# Patient Record
Sex: Male | Born: 1980
Health system: Southern US, Community
[De-identification: ages and names within clinical notes are randomized; demographics above are authoritative.]

## PROBLEM LIST (undated history)

## (undated) DIAGNOSIS — I5033 Acute on chronic diastolic (congestive) heart failure: Secondary | ICD-10-CM

## (undated) DIAGNOSIS — E119 Type 2 diabetes mellitus without complications: Secondary | ICD-10-CM

## (undated) DIAGNOSIS — IMO0001 Reserved for inherently not codable concepts without codable children: Secondary | ICD-10-CM

## (undated) DIAGNOSIS — I10 Essential (primary) hypertension: Secondary | ICD-10-CM

## (undated) DIAGNOSIS — R0902 Hypoxemia: Secondary | ICD-10-CM

## (undated) DIAGNOSIS — E662 Morbid (severe) obesity with alveolar hypoventilation: Secondary | ICD-10-CM

## (undated) DIAGNOSIS — G4733 Obstructive sleep apnea (adult) (pediatric): Secondary | ICD-10-CM

## (undated) DIAGNOSIS — K219 Gastro-esophageal reflux disease without esophagitis: Secondary | ICD-10-CM

## (undated) DIAGNOSIS — L0390 Cellulitis, unspecified: Secondary | ICD-10-CM

## (undated) HISTORY — PX: GASTRIC BYPASS: SHX52

## (undated) HISTORY — DX: Gastro-esophageal reflux disease without esophagitis: K21.9

---

## 2005-10-20 ENCOUNTER — Ambulatory Visit (HOSPITAL_COMMUNITY): Admission: RE | Admit: 2005-10-20 | Discharge: 2005-10-20 | Payer: Self-pay | Admitting: Internal Medicine

## 2013-05-06 ENCOUNTER — Encounter (HOSPITAL_BASED_OUTPATIENT_CLINIC_OR_DEPARTMENT_OTHER): Payer: Self-pay | Admitting: *Deleted

## 2013-05-06 ENCOUNTER — Emergency Department (HOSPITAL_BASED_OUTPATIENT_CLINIC_OR_DEPARTMENT_OTHER)
Admission: EM | Admit: 2013-05-06 | Discharge: 2013-05-06 | Disposition: A | Discharge status: Home / Self Care | Payer: BC Managed Care – PPO | Attending: Emergency Medicine | Admitting: Emergency Medicine

## 2013-05-06 ENCOUNTER — Emergency Department (HOSPITAL_BASED_OUTPATIENT_CLINIC_OR_DEPARTMENT_OTHER): Payer: BC Managed Care – PPO

## 2013-05-06 DIAGNOSIS — Y9389 Activity, other specified: Secondary | ICD-10-CM | POA: Insufficient documentation

## 2013-05-06 DIAGNOSIS — Z79899 Other long term (current) drug therapy: Secondary | ICD-10-CM | POA: Insufficient documentation

## 2013-05-06 DIAGNOSIS — Y929 Unspecified place or not applicable: Secondary | ICD-10-CM | POA: Insufficient documentation

## 2013-05-06 DIAGNOSIS — E119 Type 2 diabetes mellitus without complications: Secondary | ICD-10-CM | POA: Insufficient documentation

## 2013-05-06 DIAGNOSIS — I10 Essential (primary) hypertension: Secondary | ICD-10-CM | POA: Insufficient documentation

## 2013-05-06 DIAGNOSIS — Z8719 Personal history of other diseases of the digestive system: Secondary | ICD-10-CM | POA: Insufficient documentation

## 2013-05-06 DIAGNOSIS — S9031XA Contusion of right foot, initial encounter: Secondary | ICD-10-CM

## 2013-05-06 DIAGNOSIS — W208XXA Other cause of strike by thrown, projected or falling object, initial encounter: Secondary | ICD-10-CM | POA: Insufficient documentation

## 2013-05-06 DIAGNOSIS — S9030XA Contusion of unspecified foot, initial encounter: Secondary | ICD-10-CM | POA: Insufficient documentation

## 2013-05-06 HISTORY — DX: Type 2 diabetes mellitus without complications: E11.9

## 2013-05-06 HISTORY — DX: Gastro-esophageal reflux disease without esophagitis: K21.9

## 2013-05-06 HISTORY — DX: Essential (primary) hypertension: I10

## 2013-05-06 HISTORY — DX: Reserved for inherently not codable concepts without codable children: IMO0001

## 2013-05-06 MED ORDER — TRAMADOL HCL 50 MG PO TABS: 50.0000 mg | ORAL_TABLET | Freq: Four times a day (QID) | ORAL | Status: DC | PRN

## 2013-05-06 NOTE — ED Notes (Signed)
Patient states he dropped a speaker on his right foot five days ago.  C/O pain in his right lateral foot and the sole of the foot.

## 2013-05-06 NOTE — ED Provider Notes (Signed)
CSN: [id number]     Arrival date & time 05/06/13  1478 History   First MD Initiated Contact with Patient 05/06/13 1040     Chief Complaint  Patient presents with  . Foot Injury   (Consider location/radiation/quality/duration/timing/severity/associated sxs/prior Treatment) HPI Comments: Patient states he dropped a speaker on his right foot 4 days ago and has been having pain since.  Patient is a 32 y.o. male presenting with foot injury. The history is provided by the patient.  Foot Injury Location:  Foot Time since incident:  4 days Injury: yes   Foot location:  R foot Pain details:    Quality:  Sharp   Radiates to:  Does not radiate   Severity:  Moderate   Onset quality:  Sudden   Timing:  Constant   Progression:  Unchanged Chronicity:  New   Past Medical History  Diagnosis Date  . Hypertension   . Reflux   . Diabetes mellitus without complication    History reviewed. No pertinent past surgical history. No family history on file. History  Substance Use Topics  . Smoking status: Not on file  . Smokeless tobacco: Not on file  . Alcohol Use: Not on file    Review of Systems  All other systems reviewed and are negative.    Allergies  Other  Home Medications   Current Outpatient Rx  Name  Route  Sig  Dispense  Refill  . lisinopril (PRINIVIL,ZESTRIL) 10 MG tablet   Oral   Take 10 mg by mouth daily.         Marland Kitchen loratadine (CLARITIN) 10 MG tablet   Oral   Take 10 mg by mouth daily.         . metFORMIN (GLUCOPHAGE) 1000 MG tablet   Oral   Take 1,000 mg by mouth 2 (two) times daily with a meal.          BP 169/116  Pulse 99  Temp(Src) 97.9 F (36.6 C) (Oral)  Resp 26  Ht 5\' 10"  (1.778 m)  Wt 325 lb (147.419 kg)  BMI 46.63 kg/m2  SpO2 99% Physical Exam  Nursing note and vitals reviewed. Constitutional: He is oriented to person, place, and time. He appears well-developed and well-nourished. No distress.  HENT:  Head: Normocephalic and atraumatic.   Neck: Normal range of motion. Neck supple.  Musculoskeletal:  There is mild swelling over the midfoot with no obvious deformity or ecchymosis. capillary refill is intact distally.  Neurological: He is alert and oriented to person, place, and time.  Skin: Skin is warm and dry. He is not diaphoretic.    ED Course  Procedures (including critical care time) Labs Review Labs Reviewed - No data to display Imaging Review Dg Foot Complete Right  05/06/2013   CLINICAL DATA:  Initial evaluation for crush injury to the right foot which occurred on 05/01/2013 when a speaker fell upon the right foot. Dorsal and lateral foot pain.  EXAM: RIGHT FOOT COMPLETE - 3+ VIEW  COMPARISON:  None.  FINDINGS: No evidence of acute or subacute fracture or dislocation. Joint spaces well preserved. Well-preserved bone mineral density. No intrinsic osseous abnormalities.  IMPRESSION: Normal examination.   Electronically Signed   By: Hulan Saas M.D.   On: 05/06/2013 10:38    MDM  No diagnosis found. X-rays negative. This appears to be a contusion. Will advise rest ibuprofen and will prescribe tramadol if ibuprofen is not effective for his pain.    Geoffery Lyons, MD 05/06/13 1051

## 2014-06-19 ENCOUNTER — Encounter (HOSPITAL_BASED_OUTPATIENT_CLINIC_OR_DEPARTMENT_OTHER): Payer: Self-pay | Admitting: Emergency Medicine

## 2014-06-19 ENCOUNTER — Emergency Department (HOSPITAL_BASED_OUTPATIENT_CLINIC_OR_DEPARTMENT_OTHER)
Admission: EM | Admit: 2014-06-19 | Discharge: 2014-06-19 | Disposition: A | Discharge status: Home / Self Care | Payer: BC Managed Care – PPO | Attending: Emergency Medicine | Admitting: Emergency Medicine

## 2014-06-19 ENCOUNTER — Emergency Department (HOSPITAL_BASED_OUTPATIENT_CLINIC_OR_DEPARTMENT_OTHER): Payer: BC Managed Care – PPO

## 2014-06-19 DIAGNOSIS — Z8719 Personal history of other diseases of the digestive system: Secondary | ICD-10-CM | POA: Insufficient documentation

## 2014-06-19 DIAGNOSIS — Z7982 Long term (current) use of aspirin: Secondary | ICD-10-CM | POA: Insufficient documentation

## 2014-06-19 DIAGNOSIS — I10 Essential (primary) hypertension: Secondary | ICD-10-CM

## 2014-06-19 DIAGNOSIS — J069 Acute upper respiratory infection, unspecified: Secondary | ICD-10-CM

## 2014-06-19 DIAGNOSIS — E119 Type 2 diabetes mellitus without complications: Secondary | ICD-10-CM | POA: Insufficient documentation

## 2014-06-19 DIAGNOSIS — Z79899 Other long term (current) drug therapy: Secondary | ICD-10-CM | POA: Insufficient documentation

## 2014-06-19 DIAGNOSIS — J9801 Acute bronchospasm: Secondary | ICD-10-CM | POA: Insufficient documentation

## 2014-06-19 DIAGNOSIS — Z7952 Long term (current) use of systemic steroids: Secondary | ICD-10-CM | POA: Insufficient documentation

## 2014-06-19 DIAGNOSIS — R059 Cough, unspecified: Secondary | ICD-10-CM

## 2014-06-19 DIAGNOSIS — R05 Cough: Secondary | ICD-10-CM

## 2014-06-19 LAB — CBC WITH DIFFERENTIAL/PLATELET
BASOS ABS: 0 10*3/uL (ref 0.0–0.1)
BASOS PCT: 0 % (ref 0–1)
EOS PCT: 1 % (ref 0–5)
Eosinophils Absolute: 0.1 10*3/uL (ref 0.0–0.7)
HCT: 40.8 % (ref 39.0–52.0)
HEMOGLOBIN: 12.9 g/dL — AB (ref 13.0–17.0)
LYMPHS ABS: 1.7 10*3/uL (ref 0.7–4.0)
Lymphocytes Relative: 19 % (ref 12–46)
MCH: 27.7 pg (ref 26.0–34.0)
MCHC: 31.6 g/dL (ref 30.0–36.0)
MCV: 87.6 fL (ref 78.0–100.0)
MONO ABS: 0.6 10*3/uL (ref 0.1–1.0)
MONOS PCT: 7 % (ref 3–12)
NEUTROS ABS: 6.4 10*3/uL (ref 1.7–7.7)
Neutrophils Relative %: 73 % (ref 43–77)
Platelets: 224 10*3/uL (ref 150–400)
RBC: 4.66 MIL/uL (ref 4.22–5.81)
RDW: 15 % (ref 11.5–15.5)
WBC: 8.9 10*3/uL (ref 4.0–10.5)

## 2014-06-19 LAB — BASIC METABOLIC PANEL
ANION GAP: 11 (ref 5–15)
BUN: 10 mg/dL (ref 6–23)
CALCIUM: 9.7 mg/dL (ref 8.4–10.5)
CO2: 31 mEq/L (ref 19–32)
Chloride: 97 mEq/L (ref 96–112)
Creatinine, Ser: 0.8 mg/dL (ref 0.50–1.35)
Glucose, Bld: 161 mg/dL — ABNORMAL HIGH (ref 70–99)
Potassium: 4.1 mEq/L (ref 3.7–5.3)
SODIUM: 139 meq/L (ref 137–147)

## 2014-06-19 LAB — PRO B NATRIURETIC PEPTIDE: Pro B Natriuretic peptide (BNP): 535 pg/mL — ABNORMAL HIGH (ref 0–125)

## 2014-06-19 LAB — CBG MONITORING, ED: Glucose-Capillary: 149 mg/dL — ABNORMAL HIGH (ref 70–99)

## 2014-06-19 LAB — TROPONIN I

## 2014-06-19 MED ORDER — ALBUTEROL SULFATE HFA 108 (90 BASE) MCG/ACT IN AERS: 2.0000 | INHALATION_SPRAY | RESPIRATORY_TRACT | Status: DC | PRN

## 2014-06-19 MED ORDER — IPRATROPIUM-ALBUTEROL 0.5-2.5 (3) MG/3ML IN SOLN
3.0000 mL | RESPIRATORY_TRACT | Status: DC
  Administered 2014-06-19: 3 mL via RESPIRATORY_TRACT
  Filled 2014-06-19: qty 3

## 2014-06-19 MED ORDER — ALBUTEROL SULFATE HFA 108 (90 BASE) MCG/ACT IN AERS
INHALATION_SPRAY | RESPIRATORY_TRACT | Status: AC
  Administered 2014-06-19: 18:00:00
  Filled 2014-06-19: qty 6.7

## 2014-06-19 MED ORDER — PREDNISONE 50 MG PO TABS
60.0000 mg | ORAL_TABLET | Freq: Once | ORAL | Status: AC
  Administered 2014-06-19: 60 mg via ORAL
  Filled 2014-06-19 (×2): qty 1

## 2014-06-19 MED ORDER — PREDNISONE 20 MG PO TABS: ORAL_TABLET | ORAL | Status: DC

## 2014-06-19 NOTE — ED Notes (Signed)
Productive cough   And congestion   With sob x5 days

## 2014-06-19 NOTE — Discharge Instructions (Signed)
You appear to have an upper respiratory infection (URI). An upper respiratory tract infection, or cold, is a viral infection of the air passages leading to the lungs. It is contagious and can be spread to others, especially during the first 3 or 4 days. It cannot be cured by antibiotics or other medicines. °RETURN IMMEDIATELY IF you develop shortness of breath, confusion or altered mental status, a new rash, become dizzy, faint, or poorly responsive, or are unable to be cared for at home. ° °

## 2014-06-19 NOTE — ED Notes (Signed)
Patient transported to X-ray 

## 2014-06-19 NOTE — ED Provider Notes (Signed)
CSN: [id number]     Arrival date & time 06/19/14  1544 History   First MD Initiated Contact with Patient 06/19/14 1619     Chief Complaint  Patient presents with  . Shortness of Breath   cough   (Consider location/radiation/quality/duration/timing/severity/associated sxs/prior Treatment) HPI 33 year old male with history of diabetes and hypertension presents with 2 days of nasal congestion with a nonproductive cough with intermittent wheezing and intermittent mild shortness of breath with no history of asthma no treatment prior to arrival with no fever no confusion no rash no vomiting no diarrhea no chest pain no abdominal pain and no change in baseline edema not currently short of breath and has not used an inhaler previously. He was seen by his primary care doctor today who sent him to the emergency department for seemingly asymptomatic hypertension. Patient has no headache no confusion no change in speech or vision no weakness or numbness no chest pain no current shortness of breath. Past Medical History  Diagnosis Date  . Hypertension   . Reflux   . Diabetes mellitus without complication    History reviewed. No pertinent past surgical history. No family history on file. History  Substance Use Topics  . Smoking status: Never Smoker   . Smokeless tobacco: Not on file  . Alcohol Use: No    Review of Systems 10 Systems reviewed and are negative for acute change except as noted in the HPI.   Allergies  Other; Vancomycin; and Zosyn  Home Medications   Prior to Admission medications   Medication Sig Start Date End Date Taking? Authorizing Provider  aspirin 81 MG tablet Take 81 mg by mouth daily.   Yes Historical Provider, MD  furosemide (LASIX) 40 MG tablet Take 40 mg by mouth daily.   Yes Historical Provider, MD  glimepiride (AMARYL) 4 MG tablet Take 4 mg by mouth daily with breakfast.   Yes Historical Provider, MD  lisinopril-hydrochlorothiazide (PRINZIDE,ZESTORETIC) 20-25 MG  per tablet Take 1 tablet by mouth daily.   Yes Historical Provider, MD  albuterol (PROVENTIL HFA;VENTOLIN HFA) 108 (90 BASE) MCG/ACT inhaler Inhale 2 puffs into the lungs every 2 (two) hours as needed for wheezing or shortness of breath (cough). 06/19/14   Hurman HornJohn M Handsome Anglin, MD  lisinopril (PRINIVIL,ZESTRIL) 10 MG tablet Take 10 mg by mouth daily.    Historical Provider, MD  loratadine (CLARITIN) 10 MG tablet Take 10 mg by mouth daily.    Historical Provider, MD  metFORMIN (GLUCOPHAGE) 1000 MG tablet Take 1,000 mg by mouth 2 (two) times daily with a meal.    Historical Provider, MD  predniSONE (DELTASONE) 20 MG tablet 2 tabs po daily x 4 days 06/19/14   Hurman HornJohn M Broxton Broady, MD  traMADol (ULTRAM) 50 MG tablet Take 1 tablet (50 mg total) by mouth every 6 (six) hours as needed for pain. 05/06/13   Geoffery Lyonsouglas Delo, MD   BP 174/114 mmHg  Pulse 98  Temp(Src) 97.8 F (36.6 C) (Oral)  Resp 20  SpO2 97% Physical Exam  Constitutional:  Awake, alert, nontoxic appearance.  HENT:  Head: Atraumatic.  Eyes: Right eye exhibits no discharge. Left eye exhibits no discharge.  Neck: Neck supple.  Cardiovascular: Normal rate and regular rhythm.   No murmur heard. Pulmonary/Chest: Effort normal. No respiratory distress. He has wheezes. He has no rales. He exhibits no tenderness.  Diffuse wheezes, speech normal, no retractions, no accessory muscle usage, pulse oximetry low end of normal range at 90% room air  Abdominal: Soft. There is  no tenderness. There is no rebound.  Musculoskeletal: He exhibits edema. He exhibits no tenderness.  Baseline ROM, no obvious new focal weakness. Baseline mild edema both lower legs.  Neurological: He is alert.  Mental status and motor strength appears baseline for patient and situation.  Skin: No rash noted.  Psychiatric: He has a normal mood and affect.  Nursing note and vitals reviewed.   ED Course  Procedures (including critical care time) Labs Review Labs Reviewed  CBC WITH  DIFFERENTIAL - Abnormal; Notable for the following:    Hemoglobin 12.9 (*)    All other components within normal limits  PRO B NATRIURETIC PEPTIDE - Abnormal; Notable for the following:    Pro B Natriuretic peptide (BNP) 535.0 (*)    All other components within normal limits  BASIC METABOLIC PANEL - Abnormal; Notable for the following:    Glucose, Bld 161 (*)    All other components within normal limits  CBG MONITORING, ED - Abnormal; Notable for the following:    Glucose-Capillary 149 (*)    All other components within normal limits  TROPONIN I    Imaging Review No results found.   EKG Interpretation   Date/Time:  Friday June 19 2014 16:20:23 EST Ventricular Rate:  103 PR Interval:  202 QRS Duration: 68 QT Interval:  346 QTC Calculation: 453 R Axis:   -35 Text Interpretation:  Sinus tachycardia Left axis deviation Low voltage  QRS Cannot rule out Anterior infarct , age undetermined No previous ECGs  available Confirmed by [facility]  MD, Jonny RuizJOHN ([id number]) on 06/19/2014 4:36:14 PM      MDM   Final diagnoses:  Cough  URI (upper respiratory infection)  Bronchospasm, acute  Essential hypertension    Patient informed of clinical course, understand medical decision-making process, and agree with plan. I doubt any other EMC precluding discharge at this time including, but not necessarily limited to the following:HTN crisis.   Hurman HornJohn M Emalynn Clewis, MD 06/25/14 [phone]

## 2014-06-19 NOTE — ED Notes (Signed)
MD at bedside. 

## 2015-06-12 ENCOUNTER — Emergency Department (HOSPITAL_BASED_OUTPATIENT_CLINIC_OR_DEPARTMENT_OTHER): Payer: 59

## 2015-06-12 ENCOUNTER — Encounter (HOSPITAL_BASED_OUTPATIENT_CLINIC_OR_DEPARTMENT_OTHER): Payer: Self-pay | Admitting: *Deleted

## 2015-06-12 ENCOUNTER — Inpatient Hospital Stay (HOSPITAL_BASED_OUTPATIENT_CLINIC_OR_DEPARTMENT_OTHER): Payer: 59

## 2015-06-12 ENCOUNTER — Inpatient Hospital Stay (HOSPITAL_BASED_OUTPATIENT_CLINIC_OR_DEPARTMENT_OTHER)
Admission: EM | Admit: 2015-06-12 | Discharge: 2015-06-30 | DRG: 004 | Disposition: A | Discharge status: Other Acute Inpatient Hospital | Payer: 59 | Attending: Internal Medicine | Admitting: Internal Medicine

## 2015-06-12 DIAGNOSIS — Z7982 Long term (current) use of aspirin: Secondary | ICD-10-CM

## 2015-06-12 DIAGNOSIS — J9600 Acute respiratory failure, unspecified whether with hypoxia or hypercapnia: Secondary | ICD-10-CM | POA: Diagnosis present

## 2015-06-12 DIAGNOSIS — Y95 Nosocomial condition: Secondary | ICD-10-CM | POA: Diagnosis not present

## 2015-06-12 DIAGNOSIS — Z7984 Long term (current) use of oral hypoglycemic drugs: Secondary | ICD-10-CM

## 2015-06-12 DIAGNOSIS — K5900 Constipation, unspecified: Secondary | ICD-10-CM | POA: Diagnosis present

## 2015-06-12 DIAGNOSIS — D649 Anemia, unspecified: Secondary | ICD-10-CM | POA: Diagnosis not present

## 2015-06-12 DIAGNOSIS — E870 Hyperosmolality and hypernatremia: Secondary | ICD-10-CM | POA: Diagnosis not present

## 2015-06-12 DIAGNOSIS — I472 Ventricular tachycardia: Secondary | ICD-10-CM | POA: Diagnosis present

## 2015-06-12 DIAGNOSIS — E872 Acidosis: Secondary | ICD-10-CM | POA: Diagnosis present

## 2015-06-12 DIAGNOSIS — I110 Hypertensive heart disease with heart failure: Secondary | ICD-10-CM | POA: Diagnosis present

## 2015-06-12 DIAGNOSIS — E662 Morbid (severe) obesity with alveolar hypoventilation: Secondary | ICD-10-CM | POA: Diagnosis present

## 2015-06-12 DIAGNOSIS — J9602 Acute respiratory failure with hypercapnia: Principal | ICD-10-CM | POA: Diagnosis present

## 2015-06-12 DIAGNOSIS — Z23 Encounter for immunization: Secondary | ICD-10-CM | POA: Diagnosis not present

## 2015-06-12 DIAGNOSIS — J189 Pneumonia, unspecified organism: Secondary | ICD-10-CM | POA: Diagnosis not present

## 2015-06-12 DIAGNOSIS — I272 Other secondary pulmonary hypertension: Secondary | ICD-10-CM | POA: Diagnosis present

## 2015-06-12 DIAGNOSIS — E871 Hypo-osmolality and hyponatremia: Secondary | ICD-10-CM | POA: Diagnosis present

## 2015-06-12 DIAGNOSIS — R1084 Generalized abdominal pain: Secondary | ICD-10-CM | POA: Diagnosis present

## 2015-06-12 DIAGNOSIS — I5033 Acute on chronic diastolic (congestive) heart failure: Secondary | ICD-10-CM | POA: Diagnosis present

## 2015-06-12 DIAGNOSIS — Z6841 Body Mass Index (BMI) 40.0 and over, adult: Secondary | ICD-10-CM | POA: Diagnosis not present

## 2015-06-12 DIAGNOSIS — G9340 Encephalopathy, unspecified: Secondary | ICD-10-CM | POA: Diagnosis present

## 2015-06-12 DIAGNOSIS — G4733 Obstructive sleep apnea (adult) (pediatric): Secondary | ICD-10-CM | POA: Diagnosis present

## 2015-06-12 DIAGNOSIS — E876 Hypokalemia: Secondary | ICD-10-CM | POA: Diagnosis present

## 2015-06-12 DIAGNOSIS — E1165 Type 2 diabetes mellitus with hyperglycemia: Secondary | ICD-10-CM | POA: Diagnosis present

## 2015-06-12 DIAGNOSIS — J9819 Other pulmonary collapse: Secondary | ICD-10-CM | POA: Diagnosis present

## 2015-06-12 DIAGNOSIS — Z9911 Dependence on respirator [ventilator] status: Secondary | ICD-10-CM | POA: Diagnosis not present

## 2015-06-12 DIAGNOSIS — K219 Gastro-esophageal reflux disease without esophagitis: Secondary | ICD-10-CM | POA: Diagnosis present

## 2015-06-12 DIAGNOSIS — Z7952 Long term (current) use of systemic steroids: Secondary | ICD-10-CM | POA: Diagnosis not present

## 2015-06-12 DIAGNOSIS — F29 Unspecified psychosis not due to a substance or known physiological condition: Secondary | ICD-10-CM | POA: Diagnosis not present

## 2015-06-12 DIAGNOSIS — Z4659 Encounter for fitting and adjustment of other gastrointestinal appliance and device: Secondary | ICD-10-CM

## 2015-06-12 DIAGNOSIS — Z781 Physical restraint status: Secondary | ICD-10-CM | POA: Diagnosis not present

## 2015-06-12 DIAGNOSIS — Z794 Long term (current) use of insulin: Secondary | ICD-10-CM

## 2015-06-12 DIAGNOSIS — E119 Type 2 diabetes mellitus without complications: Secondary | ICD-10-CM

## 2015-06-12 DIAGNOSIS — N179 Acute kidney failure, unspecified: Secondary | ICD-10-CM | POA: Diagnosis present

## 2015-06-12 DIAGNOSIS — R1310 Dysphagia, unspecified: Secondary | ICD-10-CM | POA: Diagnosis not present

## 2015-06-12 DIAGNOSIS — Z452 Encounter for adjustment and management of vascular access device: Secondary | ICD-10-CM

## 2015-06-12 DIAGNOSIS — J9601 Acute respiratory failure with hypoxia: Secondary | ICD-10-CM | POA: Diagnosis present

## 2015-06-12 DIAGNOSIS — Z9289 Personal history of other medical treatment: Secondary | ICD-10-CM

## 2015-06-12 DIAGNOSIS — R0902 Hypoxemia: Secondary | ICD-10-CM | POA: Diagnosis present

## 2015-06-12 DIAGNOSIS — R451 Restlessness and agitation: Secondary | ICD-10-CM | POA: Diagnosis not present

## 2015-06-12 DIAGNOSIS — J9690 Respiratory failure, unspecified, unspecified whether with hypoxia or hypercapnia: Secondary | ICD-10-CM

## 2015-06-12 DIAGNOSIS — R109 Unspecified abdominal pain: Secondary | ICD-10-CM | POA: Diagnosis present

## 2015-06-12 DIAGNOSIS — R1011 Right upper quadrant pain: Secondary | ICD-10-CM | POA: Diagnosis present

## 2015-06-12 DIAGNOSIS — I10 Essential (primary) hypertension: Secondary | ICD-10-CM | POA: Diagnosis present

## 2015-06-12 DIAGNOSIS — E8729 Other acidosis: Secondary | ICD-10-CM | POA: Diagnosis present

## 2015-06-12 DIAGNOSIS — J9811 Atelectasis: Secondary | ICD-10-CM | POA: Diagnosis present

## 2015-06-12 DIAGNOSIS — I5032 Chronic diastolic (congestive) heart failure: Secondary | ICD-10-CM | POA: Diagnosis present

## 2015-06-12 DIAGNOSIS — R609 Edema, unspecified: Secondary | ICD-10-CM

## 2015-06-12 DIAGNOSIS — R112 Nausea with vomiting, unspecified: Secondary | ICD-10-CM

## 2015-06-12 HISTORY — DX: Acute on chronic diastolic (congestive) heart failure: I50.33

## 2015-06-12 LAB — COMPREHENSIVE METABOLIC PANEL
ALK PHOS: 150 U/L — AB (ref 38–126)
ALT: 31 U/L (ref 17–63)
ANION GAP: 6 (ref 5–15)
AST: 20 U/L (ref 15–41)
Albumin: 3.8 g/dL (ref 3.5–5.0)
BUN: 8 mg/dL (ref 6–20)
CALCIUM: 8.8 mg/dL — AB (ref 8.9–10.3)
CHLORIDE: 94 mmol/L — AB (ref 101–111)
CO2: 36 mmol/L — AB (ref 22–32)
Creatinine, Ser: 0.67 mg/dL (ref 0.61–1.24)
GFR calc non Af Amer: >60 mL/min (ref >60)
Glucose, Bld: 211 mg/dL — ABNORMAL HIGH (ref 65–99)
Potassium: 3.7 mmol/L (ref 3.5–5.1)
SODIUM: 136 mmol/L (ref 135–145)
Total Bilirubin: 1 mg/dL (ref 0.3–1.2)
Total Protein: 7.9 g/dL (ref 6.5–8.1)

## 2015-06-12 LAB — CBC WITH DIFFERENTIAL/PLATELET
Basophils Absolute: 0.1 10*3/uL (ref 0.0–0.1)
Basophils Relative: 1 %
EOS ABS: 0.1 10*3/uL (ref 0.0–0.7)
EOS PCT: 1 %
HCT: 43.6 % (ref 39.0–52.0)
Hemoglobin: 13.7 g/dL (ref 13.0–17.0)
LYMPHS ABS: 2 10*3/uL (ref 0.7–4.0)
Lymphocytes Relative: 19 %
MCH: 27.7 pg (ref 26.0–34.0)
MCHC: 31.4 g/dL (ref 30.0–36.0)
MCV: 88.1 fL (ref 78.0–100.0)
MONOS PCT: 5 %
Monocytes Absolute: 0.5 10*3/uL (ref 0.1–1.0)
Neutro Abs: 7.7 10*3/uL (ref 1.7–7.7)
Neutrophils Relative %: 74 %
PLATELETS: 228 10*3/uL (ref 150–400)
RBC: 4.95 MIL/uL (ref 4.22–5.81)
RDW: 14.3 % (ref 11.5–15.5)
WBC: 10.2 10*3/uL (ref 4.0–10.5)

## 2015-06-12 LAB — I-STAT ARTERIAL BLOOD GAS, ED
Acid-Base Excess: 7 mmol/L — ABNORMAL HIGH (ref 0.0–2.0)
Bicarbonate: 34.6 mEq/L — ABNORMAL HIGH (ref 20.0–24.0)
O2 SAT: 92 %
PCO2 ART: 60.8 mmHg — AB (ref 35.0–45.0)
PH ART: 7.364 (ref 7.350–7.450)
Patient temperature: 98.6
TCO2: 36 mmol/L (ref 0–100)
pO2, Arterial: 69 mmHg — ABNORMAL LOW (ref 80.0–100.0)

## 2015-06-12 LAB — TROPONIN I: Troponin I: <0.03 ng/mL (ref <0.031)

## 2015-06-12 LAB — I-STAT CG4 LACTIC ACID, ED
LACTIC ACID, VENOUS: 1.58 mmol/L (ref 0.5–2.0)
LACTIC ACID, VENOUS: 1 mmol/L (ref 0.5–2.0)

## 2015-06-12 LAB — URINALYSIS, ROUTINE W REFLEX MICROSCOPIC
GLUCOSE, UA: NEGATIVE mg/dL
HGB URINE DIPSTICK: NEGATIVE
Ketones, ur: NEGATIVE mg/dL
Leukocytes, UA: NEGATIVE
Nitrite: NEGATIVE
Protein, ur: 30 mg/dL — AB
SPECIFIC GRAVITY, URINE: 1.021 (ref 1.005–1.030)
Urobilinogen, UA: 0.2 mg/dL (ref 0.0–1.0)
pH: 7.5 (ref 5.0–8.0)

## 2015-06-12 LAB — URINE MICROSCOPIC-ADD ON

## 2015-06-12 LAB — BRAIN NATRIURETIC PEPTIDE: B Natriuretic Peptide: 86.7 pg/mL (ref 0.0–100.0)

## 2015-06-12 LAB — CBG MONITORING, ED: Glucose-Capillary: 190 mg/dL — ABNORMAL HIGH (ref 65–99)

## 2015-06-12 LAB — LIPASE, BLOOD: Lipase: 24 U/L (ref 11–51)

## 2015-06-12 MED ORDER — ONDANSETRON 4 MG PO TBDP
4.0000 mg | ORAL_TABLET | Freq: Once | ORAL | Status: AC
  Administered 2015-06-12: 4 mg via ORAL
  Filled 2015-06-12: qty 1

## 2015-06-12 MED ORDER — SODIUM CHLORIDE 0.9 % IV SOLN
INTRAVENOUS | Status: DC
  Administered 2015-06-13: 02:00:00 via INTRAVENOUS

## 2015-06-12 MED ORDER — MORPHINE SULFATE (PF) 4 MG/ML IV SOLN: 4.0000 mg | INTRAVENOUS | Status: DC | PRN

## 2015-06-12 MED ORDER — IOHEXOL 300 MG/ML  SOLN: 50.0000 mL | Freq: Once | INTRAMUSCULAR | Status: DC | PRN

## 2015-06-12 MED ORDER — LABETALOL HCL 5 MG/ML IV SOLN
10.0000 mg | Freq: Once | INTRAVENOUS | Status: AC
  Administered 2015-06-12: 10 mg via INTRAVENOUS
  Filled 2015-06-12: qty 4

## 2015-06-12 MED ORDER — SODIUM CHLORIDE 0.9 % IV BOLUS (SEPSIS)
500.0000 mL | Freq: Once | INTRAVENOUS | Status: DC
  Administered 2015-06-12: 500 mL via INTRAVENOUS

## 2015-06-12 MED ORDER — ENOXAPARIN SODIUM 150 MG/ML ~~LOC~~ SOLN
1.0000 mg/kg | Freq: Once | SUBCUTANEOUS | Status: AC
  Administered 2015-06-12: 190 mg via SUBCUTANEOUS
  Filled 2015-06-12: qty 2

## 2015-06-12 MED ORDER — SODIUM CHLORIDE 0.9 % IV BOLUS (SEPSIS)
250.0000 mL | Freq: Once | INTRAVENOUS | Status: DC
Start: 2015-06-12 — End: 2015-06-12

## 2015-06-12 MED ORDER — ONDANSETRON HCL 4 MG/2ML IJ SOLN
4.0000 mg | Freq: Three times a day (TID) | INTRAMUSCULAR | Status: DC | PRN
  Administered 2015-06-12: 4 mg via INTRAVENOUS
  Filled 2015-06-12: qty 2

## 2015-06-12 MED ORDER — KETOROLAC TROMETHAMINE 30 MG/ML IJ SOLN
30.0000 mg | Freq: Once | INTRAMUSCULAR | Status: AC
  Administered 2015-06-12: 30 mg via INTRAVENOUS
  Filled 2015-06-12: qty 1

## 2015-06-12 MED ORDER — MORPHINE SULFATE (PF) 4 MG/ML IV SOLN: 6.0000 mg | Freq: Once | INTRAVENOUS | Status: DC

## 2015-06-12 MED ORDER — IOHEXOL 300 MG/ML  SOLN: 100.0000 mL | Freq: Once | INTRAMUSCULAR | Status: DC | PRN

## 2015-06-12 MED ORDER — HYDRALAZINE HCL 20 MG/ML IJ SOLN
10.0000 mg | Freq: Once | INTRAMUSCULAR | Status: AC
  Administered 2015-06-12: 10 mg via INTRAVENOUS
  Filled 2015-06-12: qty 1

## 2015-06-12 NOTE — ED Notes (Signed)
Patient stated height is 5'10

## 2015-06-12 NOTE — ED Notes (Signed)
pc02 60.8 critical result [facility] to [facility]. No orders received at this time.

## 2015-06-12 NOTE — ED Notes (Signed)
Pt reports abd pain and nausea since yesterday

## 2015-06-12 NOTE — ED Notes (Signed)
Pt c/o of feeling a little nauseated prior to pain medication being given. Medicated for nausea per pt's request and  Per order. VSS. Family with pt

## 2015-06-12 NOTE — ED Notes (Signed)
Unable to provide urine at this time.

## 2015-06-12 NOTE — ED Provider Notes (Signed)
Medical screening examination/treatment/procedure(s) were conducted as a shared visit with non-physician practitioner(s) and myself.  I personally evaluated the patient during the encounter.   EKG Interpretation   Date/Time:  Saturday June 12 2015 16:04:31 EST Ventricular Rate:  103 PR Interval:  218 QRS Duration: 68 QT Interval:  344 QTC Calculation: 450 R Axis:   -66 Text Interpretation:  Sinus tachycardia with 1st degree A-V block Left  axis deviation Pulmonary disease pattern Septal infarct , age undetermined  Abnormal ECG No significant change since last tracing Confirmed by Dylan Ruotolo   MD-J, Maddon Horton ([id number]) on 06/12/2015 4:28:22 PM      Pt presents to the ED with abdominal pain and constipation and emesisi.  While in the ED noted to be hypoxic.     On my exam, he is awake and alert while in nasal cannula oxygen.   Lungs are clear.  Abdomen is not rigid.  Mild ttp.  Labs are normal except his hypoxia.  Suspect this may be related to obesity hypoventilation.  Unable to get a CT scan because of body size.  Will arrange for transfer to .  Will need monitoring.  [facility] if the ct scanner has a higher weight tolerance.  Derrick DibblesJon Juanpablo Ciresi, MD 06/12/15 [phone]

## 2015-06-12 NOTE — ED Notes (Signed)
Reports constipation- last BM thursday

## 2015-06-12 NOTE — Progress Notes (Signed)
34 yo abdominal pain and constipation but was found to be hypoxic, he was noted to have significant hypoxia down to 80%.  ABG 7.364/60.8/69 some somnolence Attempted abdominal chest CT but cannot get in machine 449 Lb Accepted to stepdown in case needs BiPAP  Nyala Kirchner 7:56 PM

## 2015-06-12 NOTE — ED Notes (Signed)
Pt unable to give UA at this time 

## 2015-06-12 NOTE — ED Provider Notes (Signed)
CSN: [id number]     Arrival date & time 06/12/15  1355 History   First MD Initiated Contact with Patient 06/12/15 1457     Chief Complaint  Patient presents with  . Abdominal Pain     (Consider location/radiation/quality/duration/timing/severity/associated sxs/prior Treatment) HPI   Blood pressure 147/111, pulse 108, temperature 98.4 F (36.9 C), temperature source Oral, resp. rate 18, height  (1.778 m), weight 415 lb (188.243 kg), SpO2 93 %.  Derrick [name] is a 34 y.o. male with past medical history significant for morbid obesity, hypertension, non-insulin-dependent insulin-dependent diabetes complaining of diffuse 10 out of 10 colicky abdominal pain worsening over the course of 2 days. Patient has not had a bowel movement in 2 days. He started taking MiraLAX at home with little relief. He had 3 episodes of nonbloody, nonbilious, non-coffee ground emesis this morning. States he is passing flatus normally. No history of abdominal surgeries. Denies fever, chills, change in urination, chest pain, shortness of breath, cough.  Past Medical History  Diagnosis Date  . Hypertension   . Reflux   . Diabetes mellitus without complication (HCC)    History reviewed. No pertinent past surgical history. No family history on file. Social History  Substance Use Topics  . Smoking status: Never Smoker   . Smokeless tobacco: Never Used  . Alcohol Use: No    Review of Systems  10 systems reviewed and found to be negative, except as noted in the HPI.   Allergies  Other; Vancomycin; and Zosyn  Home Medications   Prior to Admission medications   Medication Sig Start Date End Date Taking? Authorizing Provider  aspirin 81 MG tablet Take 81 mg by mouth daily.   Yes Historical Provider, MD  furosemide (LASIX) 40 MG tablet Take 40 mg by mouth daily.   Yes Historical Provider, MD  glimepiride (AMARYL) 4 MG tablet Take 4 mg by mouth daily with breakfast.   Yes Historical Provider, MD   lisinopril-hydrochlorothiazide (PRINZIDE,ZESTORETIC) 20-25 MG per tablet Take 1 tablet by mouth daily.   Yes Historical Provider, MD  loratadine (CLARITIN) 10 MG tablet Take 10 mg by mouth daily.   Yes Historical Provider, MD  metFORMIN (GLUCOPHAGE) 1000 MG tablet Take 1,000 mg by mouth 2 (two) times daily with a meal.   Yes Historical Provider, MD  albuterol (PROVENTIL HFA;VENTOLIN HFA) 108 (90 BASE) MCG/ACT inhaler Inhale 2 puffs into the lungs every 2 (two) hours as needed for wheezing or shortness of breath (cough). 06/19/14   Wayland Salinas, MD  lisinopril (PRINIVIL,ZESTRIL) 10 MG tablet Take 10 mg by mouth daily.    Historical Provider, MD  predniSONE (DELTASONE) 20 MG tablet 2 tabs po daily x 4 days 06/19/14   Wayland Salinas, MD  traMADol (ULTRAM) 50 MG tablet Take 1 tablet (50 mg total) by mouth every 6 (six) hours as needed for pain. 05/06/13   Geoffery Lyons, MD   BP 166/101 mmHg  Pulse 105  Temp(Src) 98.4 F (36.9 C) (Oral)  Resp 13  Ht  (1.778 m)  Wt 439 lb 1.6 oz (199.174 kg)  BMI 63.00 kg/m2  SpO2 95% Physical Exam  Constitutional: He is oriented to person, place, and time. He appears well-developed and well-nourished. No distress.  Morbidly obese  HENT:  Head: Normocephalic.  Mouth/Throat: Oropharynx is clear and moist.  Eyes: Conjunctivae and EOM are normal. Pupils are equal, round, and reactive to light.  Cardiovascular: Normal rate, regular rhythm and intact distal pulses.   Pulmonary/Chest: Effort normal  and breath sounds normal. No stridor. No respiratory distress. He has no wheezes. He has no rales. He exhibits no tenderness.  Abdominal: Soft. There is tenderness.  Mild, diffuse tenderness to palpation with no guarding or rebound.  Murphy sign negative, no tenderness to palpation over McBurney's point, Rovsings, Psoas and obturator all negative.   Musculoskeletal: Normal range of motion.  Neurological: He is alert and oriented to person, place, and time.   Psychiatric: He has a normal mood and affect.  Nursing note and vitals reviewed.   ED Course  Procedures (including critical care time) Labs Review Labs Reviewed  COMPREHENSIVE METABOLIC PANEL - Abnormal; Notable for the following:    Chloride 94 (*)    CO2 36 (*)    Glucose, Bld 211 (*)    Calcium 8.8 (*)    Alkaline Phosphatase 150 (*)    All other components within normal limits  CBG MONITORING, ED - Abnormal; Notable for the following:    Glucose-Capillary 190 (*)    All other components within normal limits  I-STAT ARTERIAL BLOOD GAS, ED - Abnormal; Notable for the following:    pCO2 arterial 60.8 (*)    pO2, Arterial 69.0 (*)    Bicarbonate 34.6 (*)    Acid-Base Excess 7.0 (*)    All other components within normal limits  CBC WITH DIFFERENTIAL/PLATELET  LIPASE, BLOOD  TROPONIN I  BRAIN NATRIURETIC PEPTIDE  URINALYSIS, ROUTINE W REFLEX MICROSCOPIC (NOT AT [facility])  BLOOD GAS, ARTERIAL  I-STAT CG4 LACTIC ACID, ED  I-STAT CG4 LACTIC ACID, ED  I-STAT CG4 LACTIC ACID, ED    Imaging Review Dg Chest Port 1 View  06/12/2015  CLINICAL DATA:  Patient with hypoxemia. Also complaining of abdominal pain and nausea since yesterday. EXAM: PORTABLE CHEST 1 VIEW COMPARISON:  06/19/2014 FINDINGS: Mild to moderate enlargement of the cardiopericardial silhouette, stable. No mediastinal or hilar masses or evidence of adenopathy. Lungs are clear. No convincing pulmonary edema. No pleural effusion or pneumothorax. Bony thorax is unremarkable. IMPRESSION: No acute cardiopulmonary disease. Electronically Signed   By: Amie Portlandavid  Ormond M.D.   On: 06/12/2015 16:06   I have personally reviewed and evaluated these images and lab results as part of my medical decision-making.   EKG Interpretation   Date/Time:  Saturday June 12 2015 16:04:31 EST Ventricular Rate:  103 PR Interval:  218 QRS Duration: 68 QT Interval:  344 QTC Calculation: 450 R Axis:   -66 Text Interpretation:  Sinus  tachycardia with 1st degree A-V block Left  axis deviation Pulmonary disease pattern Septal infarct , age undetermined  Abnormal ECG No significant change since last tracing Confirmed by KNAPP   MD-J, JON ([id number]) on 06/12/2015 4:28:22 PM      MDM   Final diagnoses:  Hypoxia  CO2 retention  Morbid obesity, unspecified obesity type (HCC)  Generalized abdominal pain  Non-intractable vomiting with nausea, vomiting of unspecified type    Filed Vitals:   06/12/15 1915 06/12/15 1924 06/12/15 1926 06/12/15 1930  BP:  164/109  166/101  Pulse: 107 99  105  Temp:      TempSrc:      Resp: 35 18  13  Height:      Weight:   439 lb 1.6 oz (199.174 kg)   SpO2: 96% 96%  95%    Medications  0.9 %  sodium chloride infusion ( Intravenous Rate/Dose Change 06/12/15 1941)  iohexol (OMNIPAQUE) 300 MG/ML solution 50 mL (not administered)  iohexol (OMNIPAQUE) 300 MG/ML solution 100  mL (not administered)  ondansetron (ZOFRAN-ODT) disintegrating tablet 4 mg (4 mg Oral Given 06/12/15 1552)  hydrALAZINE (APRESOLINE) injection 10 mg (10 mg Intravenous Given 06/12/15 1553)  labetalol (NORMODYNE,TRANDATE) injection 10 mg (10 mg Intravenous Given 06/12/15 1655)  enoxaparin (LOVENOX) injection 190 mg (190 mg Subcutaneous Given 06/12/15 1941)    Derrick [name] is 34 y.o. male presenting with 10 out of 10 diffuse abdominal pain associated with emesis. Patient hasn't had a bowel movement in several days. Abdominal exam is nonsurgical. Blood pressure is significantly elevated.  3:30 PM, called into room by nurse patient was lethargic, had sats in the upper 80s. As per mother patient has not been coughing, he has not been diagnosed with sleep apnea. He does not use EPAP. Oxygen responds well to supplementation via nasal cannula. ABG shows significantly elevated CO2 at 60. This is likely pickwickian syndrome. Chest x-ray negative, EKG with first-degree AV block and pulmonary disease pattern.  Patient  remains comfortable and alert. Oxygen saturation in the low 90s with nasal cannula. Pending CT abdomen pelvis, discussed with attending who recommends CT to rule out PE given his hypoxia.  Was informed by CT that this patient is too large to go into the Scanner. Lovenox given.  She continues to desat when sleeping, when roused oxygen level rebounds.  This is a shared visit with the attending physician who personally evaluated the patient and agrees with the care plan.   Case discussed with triad hospitalist Dr. Adela Glimpse who accepts transfer to [facility] Velera [place], PA-C 06/12/15 2013

## 2015-06-13 ENCOUNTER — Inpatient Hospital Stay (HOSPITAL_COMMUNITY): Payer: 59

## 2015-06-13 DIAGNOSIS — I5032 Chronic diastolic (congestive) heart failure: Secondary | ICD-10-CM | POA: Diagnosis present

## 2015-06-13 DIAGNOSIS — E119 Type 2 diabetes mellitus without complications: Secondary | ICD-10-CM

## 2015-06-13 DIAGNOSIS — I5033 Acute on chronic diastolic (congestive) heart failure: Secondary | ICD-10-CM

## 2015-06-13 DIAGNOSIS — J9601 Acute respiratory failure with hypoxia: Secondary | ICD-10-CM | POA: Diagnosis present

## 2015-06-13 DIAGNOSIS — J9602 Acute respiratory failure with hypercapnia: Principal | ICD-10-CM

## 2015-06-13 DIAGNOSIS — E662 Morbid (severe) obesity with alveolar hypoventilation: Secondary | ICD-10-CM | POA: Diagnosis present

## 2015-06-13 DIAGNOSIS — G4733 Obstructive sleep apnea (adult) (pediatric): Secondary | ICD-10-CM | POA: Diagnosis present

## 2015-06-13 DIAGNOSIS — R109 Unspecified abdominal pain: Secondary | ICD-10-CM | POA: Diagnosis present

## 2015-06-13 DIAGNOSIS — I10 Essential (primary) hypertension: Secondary | ICD-10-CM | POA: Diagnosis present

## 2015-06-13 DIAGNOSIS — K5900 Constipation, unspecified: Secondary | ICD-10-CM | POA: Diagnosis present

## 2015-06-13 DIAGNOSIS — R0902 Hypoxemia: Secondary | ICD-10-CM | POA: Diagnosis present

## 2015-06-13 DIAGNOSIS — R1011 Right upper quadrant pain: Secondary | ICD-10-CM | POA: Diagnosis present

## 2015-06-13 DIAGNOSIS — R1084 Generalized abdominal pain: Secondary | ICD-10-CM | POA: Diagnosis present

## 2015-06-13 DIAGNOSIS — R609 Edema, unspecified: Secondary | ICD-10-CM

## 2015-06-13 LAB — BLOOD GAS, ARTERIAL
ACID-BASE EXCESS: 8.6 mmol/L — AB (ref 0.0–2.0)
ACID-BASE EXCESS: 7.9 mmol/L — AB (ref 0.0–2.0)
Acid-Base Excess: 8.6 mmol/L — ABNORMAL HIGH (ref 0.0–2.0)
BICARBONATE: 32.7 meq/L — AB (ref 20.0–24.0)
Bicarbonate: 35.9 mEq/L — ABNORMAL HIGH (ref 20.0–24.0)
Bicarbonate: 35.9 mEq/L — ABNORMAL HIGH (ref 20.0–24.0)
DELIVERY SYSTEMS: POSITIVE
DELIVERY SYSTEMS: POSITIVE
DRAWN BY: 331001
DRAWN BY: 358491
Drawn by: 33100
EXPIRATORY PAP: 4
Expiratory PAP: 6
FIO2: 0.45
FIO2: 0.4
FIO2: 0.5
INSPIRATORY PAP: 8
Inspiratory PAP: 14
MODE: POSITIVE
Mode: POSITIVE
O2 Saturation: 92.9 %
O2 Saturation: 94.4 %
O2 Saturation: 95.8 %
PATIENT TEMPERATURE: 98.6
PCO2 ART: 87.3 mmHg — AB (ref 35.0–45.0)
PEEP/CPAP: 5 cmH2O
PH ART: 7.232 — AB (ref 7.350–7.450)
PO2 ART: 77.6 mmHg — AB (ref 80.0–100.0)
Patient temperature: 98.3
Patient temperature: 98
RATE: 22 resp/min
TCO2: 38.6 mmol/L (ref 0–100)
TCO2: 38.6 mmol/L (ref 0–100)
TCO2: 34.4 mmol/L (ref 0–100)
VT: 580 mL
pCO2 arterial: 88.5 mmHg (ref 35.0–45.0)
pCO2 arterial: 52.4 mmHg — ABNORMAL HIGH (ref 35.0–45.0)
pH, Arterial: 7.237 — ABNORMAL LOW (ref 7.350–7.450)
pH, Arterial: 7.41 (ref 7.350–7.450)
pO2, Arterial: 81.7 mmHg (ref 80.0–100.0)
pO2, Arterial: 73.8 mmHg — ABNORMAL LOW (ref 80.0–100.0)

## 2015-06-13 LAB — CBC
HCT: 42.9 % (ref 39.0–52.0)
Hemoglobin: 13.1 g/dL (ref 13.0–17.0)
MCH: 28.1 pg (ref 26.0–34.0)
MCHC: 30.5 g/dL (ref 30.0–36.0)
MCV: 92.1 fL (ref 78.0–100.0)
PLATELETS: 232 10*3/uL (ref 150–400)
RBC: 4.66 MIL/uL (ref 4.22–5.81)
RDW: 15 % (ref 11.5–15.5)
WBC: 12.2 10*3/uL — ABNORMAL HIGH (ref 4.0–10.5)

## 2015-06-13 LAB — BASIC METABOLIC PANEL
ANION GAP: 6 (ref 5–15)
BUN: 10 mg/dL (ref 6–20)
CALCIUM: 8.5 mg/dL — AB (ref 8.9–10.3)
CO2: 33 mmol/L — AB (ref 22–32)
CREATININE: 1.05 mg/dL (ref 0.61–1.24)
Chloride: 95 mmol/L — ABNORMAL LOW (ref 101–111)
GFR calc non Af Amer: >60 mL/min (ref >60)
Glucose, Bld: 226 mg/dL — ABNORMAL HIGH (ref 65–99)
Potassium: 3.9 mmol/L (ref 3.5–5.1)
SODIUM: 134 mmol/L — AB (ref 135–145)

## 2015-06-13 LAB — GLUCOSE, CAPILLARY
GLUCOSE-CAPILLARY: 183 mg/dL — AB (ref 65–99)
GLUCOSE-CAPILLARY: 170 mg/dL — AB (ref 65–99)
GLUCOSE-CAPILLARY: 142 mg/dL — AB (ref 65–99)
GLUCOSE-CAPILLARY: 122 mg/dL — AB (ref 65–99)
Glucose-Capillary: 193 mg/dL — ABNORMAL HIGH (ref 65–99)
Glucose-Capillary: 189 mg/dL — ABNORMAL HIGH (ref 65–99)

## 2015-06-13 LAB — MRSA PCR SCREENING: MRSA BY PCR: NEGATIVE

## 2015-06-13 LAB — TROPONIN I: Troponin I: <0.03 ng/mL (ref <0.031)

## 2015-06-13 LAB — D-DIMER, QUANTITATIVE: D-Dimer, Quant: 0.58 ug/mL-FEU — ABNORMAL HIGH (ref 0.00–0.48)

## 2015-06-13 MED ORDER — ONDANSETRON HCL 4 MG/2ML IJ SOLN
4.0000 mg | Freq: Four times a day (QID) | INTRAMUSCULAR | Status: DC | PRN
  Administered 2015-06-18: 4 mg via INTRAVENOUS
  Filled 2015-06-13: qty 2

## 2015-06-13 MED ORDER — FENTANYL CITRATE (PF) 100 MCG/2ML IJ SOLN
25.0000 ug | INTRAMUSCULAR | Status: DC | PRN
  Administered 2015-06-13 – 2015-06-16 (×7): 100 ug via INTRAVENOUS
  Administered 2015-06-16 (×2): 50 ug via INTRAVENOUS
  Filled 2015-06-13 (×5): qty 2

## 2015-06-13 MED ORDER — LISINOPRIL 20 MG PO TABS: 20.0000 mg | ORAL_TABLET | Freq: Every day | ORAL | Status: DC

## 2015-06-13 MED ORDER — FENTANYL CITRATE (PF) 100 MCG/2ML IJ SOLN
INTRAMUSCULAR | Status: AC
  Filled 2015-06-13: qty 2

## 2015-06-13 MED ORDER — FUROSEMIDE 10 MG/ML IJ SOLN
80.0000 mg | Freq: Once | INTRAMUSCULAR | Status: AC
  Administered 2015-06-13: 80 mg via INTRAVENOUS
  Filled 2015-06-13: qty 8

## 2015-06-13 MED ORDER — ENOXAPARIN SODIUM 150 MG/ML ~~LOC~~ SOLN
1.0000 mg/kg | Freq: Two times a day (BID) | SUBCUTANEOUS | Status: DC
Start: 2015-06-13 — End: 2015-06-14
  Administered 2015-06-13 – 2015-06-14 (×2): 200 mg via SUBCUTANEOUS
  Filled 2015-06-13 (×4): qty 1.33

## 2015-06-13 MED ORDER — PANTOPRAZOLE SODIUM 40 MG IV SOLR
40.0000 mg | Freq: Two times a day (BID) | INTRAVENOUS | Status: DC
  Administered 2015-06-13 – 2015-06-14 (×3): 40 mg via INTRAVENOUS
  Filled 2015-06-13 (×6): qty 40

## 2015-06-13 MED ORDER — ROCURONIUM BROMIDE 50 MG/5ML IV SOLN
70.0000 mg | Freq: Once | INTRAVENOUS | Status: AC
  Administered 2015-06-13: 70 mg via INTRAVENOUS
  Filled 2015-06-13: qty 7

## 2015-06-13 MED ORDER — SODIUM CHLORIDE 0.9 % IV SOLN: 250.0000 mL | INTRAVENOUS | Status: DC | PRN

## 2015-06-13 MED ORDER — POTASSIUM CHLORIDE 20 MEQ/15ML (10%) PO SOLN
40.0000 meq | Freq: Three times a day (TID) | ORAL | Status: AC
  Administered 2015-06-13 (×2): 40 meq
  Filled 2015-06-13 (×3): qty 30

## 2015-06-13 MED ORDER — SODIUM CHLORIDE 0.9 % IV SOLN: INTRAVENOUS | Status: DC

## 2015-06-13 MED ORDER — SODIUM CHLORIDE 0.9 % IJ SOLN
3.0000 mL | INTRAMUSCULAR | Status: DC | PRN
  Administered 2015-06-16 – 2015-06-19 (×2): 3 mL via INTRAVENOUS
  Filled 2015-06-13 (×2): qty 3

## 2015-06-13 MED ORDER — PROMETHAZINE HCL 25 MG/ML IJ SOLN
12.5000 mg | Freq: Four times a day (QID) | INTRAMUSCULAR | Status: DC | PRN
  Filled 2015-06-13: qty 1

## 2015-06-13 MED ORDER — INSULIN ASPART 100 UNIT/ML ~~LOC~~ SOLN: 0.0000 [IU] | Freq: Every day | SUBCUTANEOUS | Status: DC

## 2015-06-13 MED ORDER — ETOMIDATE 2 MG/ML IV SOLN
20.0000 mg | Freq: Once | INTRAVENOUS | Status: AC
  Administered 2015-06-13: 20 mg via INTRAVENOUS

## 2015-06-13 MED ORDER — SODIUM CHLORIDE 0.9 % IV SOLN
25.0000 ug/h | INTRAVENOUS | Status: DC
  Administered 2015-06-13 – 2015-06-14 (×2): 200 ug/h via INTRAVENOUS
  Administered 2015-06-14: 150 ug/h via INTRAVENOUS
  Administered 2015-06-15: 100 ug/h via INTRAVENOUS
  Administered 2015-06-16: 300 ug/h via INTRAVENOUS
  Administered 2015-06-16: 150 ug/h via INTRAVENOUS
  Administered 2015-06-17: 300 ug/h via INTRAVENOUS
  Administered 2015-06-18: 200 ug/h via INTRAVENOUS
  Administered 2015-06-18: 300 ug/h via INTRAVENOUS
  Administered 2015-06-19: 200 ug/h via INTRAVENOUS
  Administered 2015-06-19: 300 ug/h via INTRAVENOUS
  Filled 2015-06-13 (×12): qty 50

## 2015-06-13 MED ORDER — HYDROCHLOROTHIAZIDE 25 MG PO TABS
25.0000 mg | ORAL_TABLET | Freq: Every day | ORAL | Status: DC
  Filled 2015-06-13: qty 1

## 2015-06-13 MED ORDER — OXYCODONE HCL 5 MG PO TABS: 5.0000 mg | ORAL_TABLET | ORAL | Status: DC | PRN

## 2015-06-13 MED ORDER — ACETAMINOPHEN 325 MG PO TABS: 650.0000 mg | ORAL_TABLET | Freq: Four times a day (QID) | ORAL | Status: DC | PRN

## 2015-06-13 MED ORDER — MIDAZOLAM HCL 2 MG/2ML IJ SOLN
1.0000 mg | INTRAMUSCULAR | Status: DC | PRN
  Administered 2015-06-13 (×5): 2 mg via INTRAVENOUS
  Filled 2015-06-13 (×5): qty 2

## 2015-06-13 MED ORDER — ONDANSETRON HCL 4 MG PO TABS: 4.0000 mg | ORAL_TABLET | Freq: Four times a day (QID) | ORAL | Status: DC | PRN

## 2015-06-13 MED ORDER — INSULIN ASPART 100 UNIT/ML ~~LOC~~ SOLN
2.0000 [IU] | SUBCUTANEOUS | Status: DC
  Administered 2015-06-13 – 2015-06-14 (×4): 2 [IU] via SUBCUTANEOUS
  Administered 2015-06-15: 4 [IU] via SUBCUTANEOUS
  Administered 2015-06-15 (×2): 2 [IU] via SUBCUTANEOUS
  Administered 2015-06-15: 4 [IU] via SUBCUTANEOUS

## 2015-06-13 MED ORDER — LORATADINE 10 MG PO TABS
10.0000 mg | ORAL_TABLET | Freq: Every day | ORAL | Status: DC
  Administered 2015-06-14 – 2015-06-30 (×17): 10 mg via ORAL
  Filled 2015-06-13 (×17): qty 1

## 2015-06-13 MED ORDER — SODIUM CHLORIDE 0.9 % IV SOLN
INTRAVENOUS | Status: DC
  Administered 2015-06-13: 11:00:00 via INTRAVENOUS
  Administered 2015-06-17: 20 mL/h via INTRAVENOUS
  Administered 2015-06-18: 10 mL/h via INTRAVENOUS
  Administered 2015-06-21: 20:00:00 via INTRAVENOUS

## 2015-06-13 MED ORDER — MIDAZOLAM HCL 5 MG/ML IJ SOLN
1.0000 mg/h | INTRAMUSCULAR | Status: DC
  Administered 2015-06-13: 3 mg/h via INTRAVENOUS
  Administered 2015-06-13: 4 mg/h via INTRAVENOUS
  Administered 2015-06-14: 5 mg/h via INTRAVENOUS
  Administered 2015-06-15: 3 mg/h via INTRAVENOUS
  Administered 2015-06-15: 5 mg/h via INTRAVENOUS
  Administered 2015-06-16: 3 mg/h via INTRAVENOUS
  Filled 2015-06-13 (×6): qty 10

## 2015-06-13 MED ORDER — ONDANSETRON HCL 4 MG/2ML IJ SOLN: 4.0000 mg | Freq: Four times a day (QID) | INTRAMUSCULAR | Status: DC | PRN

## 2015-06-13 MED ORDER — FUROSEMIDE 10 MG/ML IJ SOLN
40.0000 mg | Freq: Four times a day (QID) | INTRAMUSCULAR | Status: AC
  Administered 2015-06-13 – 2015-06-14 (×3): 40 mg via INTRAVENOUS
  Filled 2015-06-13 (×3): qty 4

## 2015-06-13 MED ORDER — ACETAMINOPHEN 650 MG RE SUPP: 650.0000 mg | Freq: Four times a day (QID) | RECTAL | Status: DC | PRN

## 2015-06-13 MED ORDER — HYDROMORPHONE HCL 1 MG/ML IJ SOLN: 0.5000 mg | INTRAMUSCULAR | Status: DC | PRN

## 2015-06-13 MED ORDER — FUROSEMIDE 40 MG PO TABS: 40.0000 mg | ORAL_TABLET | Freq: Every day | ORAL | Status: DC

## 2015-06-13 MED ORDER — MIDAZOLAM HCL 2 MG/2ML IJ SOLN
2.0000 mg | Freq: Once | INTRAMUSCULAR | Status: AC
Start: 2015-06-13 — End: 2015-06-13
  Administered 2015-06-13: 2 mg via INTRAVENOUS

## 2015-06-13 MED ORDER — SODIUM CHLORIDE 0.9 % IJ SOLN
3.0000 mL | Freq: Two times a day (BID) | INTRAMUSCULAR | Status: DC
  Administered 2015-06-13 – 2015-06-16 (×7): 3 mL via INTRAVENOUS
  Administered 2015-06-17: 6 mL via INTRAVENOUS
  Administered 2015-06-17 – 2015-06-18 (×2): 3 mL via INTRAVENOUS
  Administered 2015-06-18: 10 mL via INTRAVENOUS
  Administered 2015-06-20: 3 mL via INTRAVENOUS

## 2015-06-13 MED ORDER — ALUM & MAG HYDROXIDE-SIMETH 200-200-20 MG/5ML PO SUSP
30.0000 mL | Freq: Four times a day (QID) | ORAL | Status: DC | PRN
  Filled 2015-06-13: qty 30

## 2015-06-13 MED ORDER — FENTANYL CITRATE (PF) 100 MCG/2ML IJ SOLN
100.0000 ug | Freq: Once | INTRAMUSCULAR | Status: AC
  Administered 2015-06-13: 100 ug via INTRAVENOUS

## 2015-06-13 MED ORDER — LISINOPRIL-HYDROCHLOROTHIAZIDE 20-25 MG PO TABS: 1.0000 | ORAL_TABLET | Freq: Every day | ORAL | Status: DC

## 2015-06-13 MED ORDER — INSULIN ASPART 100 UNIT/ML ~~LOC~~ SOLN
0.0000 [IU] | Freq: Three times a day (TID) | SUBCUTANEOUS | Status: DC
  Administered 2015-06-13: 4 [IU] via SUBCUTANEOUS

## 2015-06-13 MED ORDER — MIDAZOLAM HCL 2 MG/2ML IJ SOLN
INTRAMUSCULAR | Status: AC
  Administered 2015-06-13: 2 mg
  Filled 2015-06-13: qty 2

## 2015-06-13 NOTE — Procedures (Signed)
Intubation Procedure Note Derrick HansenReginald A [name] [id number] 05-24-1981  Procedure: Intubation Indications: Respiratory insufficiency  Procedure Details Consent: Risks of procedure as well as the alternatives and risks of each were explained to the (patient/caregiver).  Consent for procedure obtained. Time Out: Verified patient identification, verified procedure, site/side was marked, verified correct patient position, special equipment/implants available, medications/allergies/relevent history reviewed, required imaging and test results available.  Performed  Maximum sterile technique was used including gloves, hand hygiene and mask.  MAC    Evaluation Hemodynamic Status: BP stable throughout; O2 sats: stable throughout Patient's Current Condition: stable Complications: No apparent complications Patient did tolerate procedure well. Chest X-ray ordered to verify placement.  CXR: pending.   Derrick [name] 06/13/2015

## 2015-06-13 NOTE — Progress Notes (Signed)
Attempted putting patient on BIPAP starting at IPAP 12 and EPAP 6. Patient could not tolerated. Lowered settings to 10/5. Patient tolerating well.

## 2015-06-13 NOTE — Progress Notes (Signed)
[facility] Beat [facility] Dr Sherene SiresWert notified. will continue to monitor

## 2015-06-13 NOTE — Progress Notes (Signed)
ANTICOAGULATION CONSULT NOTE - Initial Consult  Pharmacy Consult for LMWH Indication: r/o PE  Allergies  Allergen Reactions  . Other     An antibiotic  . Vancomycin   . Zosyn [Piperacillin Sod-Tazobactam So]     Patient Measurements: Height: 5\' 10"  (177.8 cm) Weight: (!) 442 lb 14.4 oz (200.898 kg) IBW/kg (Calculated) : 73 Heparin Dosing Weight: 120.3 kg  Vital Signs: Temp: 98.9 F (37.2 C) (11/13 0700) Temp Source: Axillary (11/13 0700) BP: 148/82 mmHg (11/13 0740) Pulse Rate: 102 (11/13 0740)  Labs:  Recent Labs  06/12/15 1539 06/13/15 0505  HGB 13.7 13.1  HCT 43.6 42.9  PLT 228 232  CREATININE 0.67 1.05  TROPONINI <0.03  --     Estimated Creatinine Clearance: 175.8 mL/min (by C-G formula based on Cr of 1.05).   Medical History: Past Medical History  Diagnosis Date  . Hypertension   . Reflux   . Diabetes mellitus without complication (HCC)     Medications:  Prescriptions prior to admission  Medication Sig Dispense Refill Last Dose  . aspirin 81 MG tablet Take 81 mg by mouth daily.     . furosemide (LASIX) 40 MG tablet Take 40 mg by mouth daily.     Marland Kitchen. glimepiride (AMARYL) 4 MG tablet Take 4 mg by mouth daily with breakfast.     . lisinopril-hydrochlorothiazide (PRINZIDE,ZESTORETIC) 20-25 MG per tablet Take 1 tablet by mouth daily.     Marland Kitchen. loratadine (CLARITIN) 10 MG tablet Take 10 mg by mouth daily.     . metFORMIN (GLUCOPHAGE) 1000 MG tablet Take 1,000 mg by mouth 2 (two) times daily with a meal.     . albuterol (PROVENTIL HFA;VENTOLIN HFA) 108 (90 BASE) MCG/ACT inhaler Inhale 2 puffs into the lungs every 2 (two) hours as needed for wheezing or shortness of breath (cough). 1 Inhaler 0   . lisinopril (PRINIVIL,ZESTRIL) 10 MG tablet Take 10 mg by mouth daily.     . predniSONE (DELTASONE) 20 MG tablet 2 tabs po daily x 4 days 8 tablet 0   . traMADol (ULTRAM) 50 MG tablet Take 1 tablet (50 mg total) by mouth every 6 (six) hours as needed for pain. 15 tablet 0      Assessment: 34 yo man w/ acute hypoxic, hypercapnic resp failure.  [facility].  Wt 200.9 kg. He received LMWH 190 mg 11/12 at 1941.   Creat 0.67>1.05 Data for NOAC in the morbid obesity population is scarce. Per the Internation Society on Thrombosis and Haemostasis (ISTH) 2106 guideline suggests avoiding the use of rivaroxaban (and other direct oral anticoagulants) in patients with a BMI>40 or weight >120 kg due to the lack of clinical data in this population. The safest oral anticoagulant option in the morbid obese is warfarin.  Goal of Therapy:  Anti-Xa level 0.6-1 units/ml 4hrs after LMWH dose given Monitor platelets by anticoagulation protocol: Yes   Plan:  - LMWH 1 mg/kg q12 = 200 mg sq q12h - CBC and BMET q72 hrs  - if considering oral anticoagulation: warfarin is drug of choice in morbid obesity Herby AbrahamMichelle T. Alizia Greif, Pharm.D. [phone] 06/13/2015 8:36 AM

## 2015-06-13 NOTE — H&P (Addendum)
[facility] Admission History and Physical       KURON DOCKEN WUJ:[id number] DOB: [birth year] DOA: 06/12/2015  Referring physician: EDP PCP: [facility]  Specialists:   Chief Complaint: ABD Pain   HPI: Derrick [name] is a 34 y.o. male with a history of Morbid Obesity, DM2, HTN, OSA who presented to the ED with complaints of colicky RUQ ABD Pain x 2-3 days.  He denies any Nausea and Vomiting or Diarrhea, He reports his last BM was 3 days ago.   He was evaluated in the ED and was found to have hypoxemia with O2sats in the 80's.   He was also found to have hypercarbia with a pCO2 = 60.8.    Due to weight constraints of the CT scanner a CTA of the Chest  And ABD was not able to be performed and he was transferred for further workup.     Review of Systems:  Constitutional: No Weight Loss, No Weight Gain, Night Sweats, Fevers, Chills, Dizziness, Light Headedness, Fatigue, or Generalized Weakness HEENT: No Headaches, Difficulty Swallowing,Tooth/Dental Problems,Sore Throat,  No Sneezing, Rhinitis, Ear Ache, Nasal Congestion, or Post Nasal Drip,  Cardio-vascular:  No Chest pain, Orthopnea, PND, Edema in Lower Extremities, Anasarca, Dizziness, Palpitations  Resp: No Dyspnea, No DOE, No Productive Cough, No Non-Productive Cough, No Hemoptysis, No Wheezing.    GI: No Heartburn, Indigestion, +Abdominal Pain, Nausea, Vomiting, Diarrhea, +Constipation, Hematemesis, Hematochezia, Melena, Change in Bowel Habits,  Loss of Appetite  GU: No Dysuria, No Change in Color of Urine, No Urgency or Urinary Frequency, No Flank pain.  Musculoskeletal: No Joint Pain or Swelling, No Decreased Range of Motion, No Back Pain.  Neurologic: No Syncope, No Seizures, Muscle Weakness, Paresthesia, Vision Disturbance or Loss, No Diplopia, No Vertigo, No Difficulty Walking,  Skin: No Rash or Lesions. Psych: No Change in Mood or Affect, No Depression or Anxiety, No Memory loss, No  Confusion, or Hallucinations   Past Medical History  Diagnosis Date  . Hypertension   . Reflux   . Diabetes mellitus without complication (HCC)      History reviewed. No pertinent past surgical history.    Prior to Admission medications   Medication Sig Start Date End Date Taking? Authorizing Provider  aspirin 81 MG tablet Take 81 mg by mouth daily.   Yes Historical Provider, MD  furosemide (LASIX) 40 MG tablet Take 40 mg by mouth daily.   Yes Historical Provider, MD  glimepiride (AMARYL) 4 MG tablet Take 4 mg by mouth daily with breakfast.   Yes Historical Provider, MD  lisinopril-hydrochlorothiazide (PRINZIDE,ZESTORETIC) 20-25 MG per tablet Take 1 tablet by mouth daily.   Yes Historical Provider, MD  loratadine (CLARITIN) 10 MG tablet Take 10 mg by mouth daily.   Yes Historical Provider, MD  metFORMIN (GLUCOPHAGE) 1000 MG tablet Take 1,000 mg by mouth 2 (two) times daily with a meal.   Yes Historical Provider, MD  albuterol (PROVENTIL HFA;VENTOLIN HFA) 108 (90 BASE) MCG/ACT inhaler Inhale 2 puffs into the lungs every 2 (two) hours as needed for wheezing or shortness of breath (cough). 06/19/14   Wayland Salinas, MD  lisinopril (PRINIVIL,ZESTRIL) 10 MG tablet Take 10 mg by mouth daily.    Historical Provider, MD  predniSONE (DELTASONE) 20 MG tablet 2 tabs po daily x 4 days 06/19/14   Wayland Salinas, MD  traMADol (ULTRAM) 50 MG tablet Take 1 tablet (50 mg total) by mouth every 6 (six) hours as needed for pain. 05/06/13  Geoffery Lyonsouglas Delo, MD     Allergies  Allergen Reactions  . Other     An antibiotic  . Vancomycin   . Zosyn [Piperacillin Sod-Tazobactam So]     Social History:  reports that he has never smoked. He has never used smokeless tobacco. He reports that he does not drink alcohol or use illicit drugs.    No family history on file.     Physical Exam:  GEN:  Pleasant Morbidly Obese  34 y.o. African American male examined and in no acute distress; cooperative with exam Filed  Vitals:   06/12/15 2245 06/12/15 2300 06/12/15 2315 06/13/15 0045  BP: 147/102 131/87 139/82 151/95  Pulse: 99 101 102 97  Temp:    98.6 F (37 C)  TempSrc:    Axillary  Resp: 20 24 23 20   Height:    5\' 10"  (1.778 m)  Weight:      SpO2: 97% 98% 91% 91%   Blood pressure 151/95, pulse 97, temperature 98.6 F (37 C), temperature source Axillary, resp. rate 20, height 5\' 10"  (1.778 m), weight 199.174 kg (439 lb 1.6 oz), SpO2 91 %. PSYCH: He is alert and oriented x4; does not appear anxious does not appear depressed; affect is normal HEENT: Normocephalic and Atraumatic, Mucous membranes pink; PERRLA; EOM intact; Fundi:  Benign;  No scleral icterus, Nares: Patent, Oropharynx: Clear, Fair Dentition,    Neck:  FROM, No Cervical Lymphadenopathy nor Thyromegaly or Carotid Bruit; No JVD; Breasts:: Not examined CHEST WALL: No tenderness CHEST: Normal respiration, clear to auscultation bilaterally HEART: Regular rate and rhythm; no murmurs rubs or gallops BACK: No kyphosis or scoliosis; No CVA tenderness ABDOMEN: Positive Bowel Sounds, Obese, Soft Non-Tender, No Rebound or Guarding; No Masses, No Organomegaly, +Pannus;  Rectal Exam: Not done EXTREMITIES: No Cyanosis, Clubbing, or Edema; No Ulcerations. Genitalia: not examined PULSES: 2+ and symmetric SKIN: Normal hydration no rash or ulceration CNS:  Alert and Oriented x 4, No Focal Deficits Vascular: pulses palpable throughout    Labs on Admission:  Basic Metabolic Panel:  Recent Labs Lab 06/12/15 1539  NA 136  K 3.7  CL 94*  CO2 36*  GLUCOSE 211*  BUN 8  CREATININE 0.67  CALCIUM 8.8*   Liver Function Tests:  Recent Labs Lab 06/12/15 1539  AST 20  ALT 31  ALKPHOS 150*  BILITOT 1.0  PROT 7.9  ALBUMIN 3.8    Recent Labs Lab 06/12/15 1539  LIPASE 24   No results for input(s): AMMONIA in the last 168 hours. CBC:  Recent Labs Lab 06/12/15 1539  WBC 10.2  NEUTROABS 7.7  HGB 13.7  HCT 43.6  MCV 88.1  PLT 228     Cardiac Enzymes:  Recent Labs Lab 06/12/15 1539  TROPONINI <0.03    BNP (last 3 results)  Recent Labs  06/12/15 1539  BNP 86.7    ProBNP (last 3 results)  Recent Labs  06/19/14 1645  PROBNP 535.0*    CBG:  Recent Labs Lab 06/12/15 1428 06/13/15 0125  GLUCAP 190* 193*    Radiological Exams on Admission: Dg Chest Port 1 View  06/12/2015  CLINICAL DATA:  Patient with hypoxemia. Also complaining of abdominal pain and nausea since yesterday. EXAM: PORTABLE CHEST 1 VIEW COMPARISON:  06/19/2014 FINDINGS: Mild to moderate enlargement of the cardiopericardial silhouette, stable. No mediastinal or hilar masses or evidence of adenopathy. Lungs are clear. No convincing pulmonary edema. No pleural effusion or pneumothorax. Bony thorax is unremarkable. IMPRESSION: No acute cardiopulmonary disease.  Electronically Signed   By: Amie Portland M.D.   On: 06/12/2015 16:06     EKG: Independently reviewed.      Assessment/Plan:   34 y.o. male with  Active Problems:   1.      CO2 retention- due to OSA or Pickwickian syndrome   Monitor O2 sats   BiPAP PRN   Repeat ABG     2.      Hypoxemia- due to Pickwickian syndrome and OSA, but rule out PE   BiPAP PRN   O2 PRN   V/Q Scan in AM   Full dose Lovenox Rx    3.      Generalized abdominal pain   ABD [place] in AM   Monitor LFTs       4.      Constipation   Miralax BID PRN     5.      Morbid obesity (HCC)   Discussed Weight Loss    Encourage to Continue to Diet and Pursue     Bariatric surgical Options     6.      Diabetes mellitus without complication (HCC)   Hold Metformin and Amaryl Rx   SSI coverage PRN   Check HbA1C     7.      Essential hypertension   Continue Lisinopril/HCTZ Rx    Monitor BPs     8.      DVT Prophylaxis   On Full dose Lovenox Rx until rules out for PE     Code Status:     FULL CODE        Family Communication:   Mother at Bedside    Disposition Plan:    Inpatient  Status         Time spent:  24 Minutes      Ron Parker [facility] Pager [phone]   If 7AM -7PM Please Contact the Day Rounding Team MD for [facility]  If 7PM-7AM, Please Contact Night-Floor Coverage  [email or web address] Password [email or web address] 06/13/2015, 1:37 AM     ADDENDUM:   Patient was seen and examined on 06/13/2015

## 2015-06-13 NOTE — Progress Notes (Signed)
VASCULAR LAB PRELIMINARY  PRELIMINARY  PRELIMINARY  PRELIMINARY  Bilateral lower extremity venous duplex  completed.    Preliminary report:  Bilateral:  No obvious evidence of DVT, superficial thrombosis, or Baker's Cyst.   Jaila Schellhorn, RVT 06/13/2015, 3:31 PM

## 2015-06-13 NOTE — Procedures (Signed)
Pulmonary Artery Catheter Insertion Procedure Note Derrick HansenReginald A [name] [id number] 01-11-1981  Procedure: Insertion of Pulmonary Artery Catheter  Indications: Assessment of intravascular volume and Guide hemodynamic management  Procedure Details Consent: Risks of procedure as well as the alternatives and risks of each were explained to the (patient/caregiver).  Consent for procedure obtained. Time Out: Verified patient identification, verified procedure, site/side was marked, verified correct patient position, special equipment/implants available, medications/allergies/relevent history reviewed, required imaging and test results available.  Performed  Description of Procedure Maximum sterile technique was used including antiseptics, cap, gloves, gown, hand hygiene, mask and sheet. Skin prep: Chlorhexidine; local anesthetic administered Pulmonary Artery Catheter was placed in the right internal jugular vein; introducer inserted over guidewire, initial position assessed by monitoring pressure waveform and catheter advanced after balloon inflation.  Evaluation Pressure waveform tracings: good, Pulmonary capillary wedge tracing: good, wedge tracing obtained after inflation of balloon with 1.25 - 1.5 cc. Complications: No apparent complications Patient did tolerate procedure well. Chest X-ray ordered to verify placement.  CXR: pending.   Derrick [name] 06/13/2015

## 2015-06-13 NOTE — Consult Note (Signed)
PULMONARY / CRITICAL CARE MEDICINE   Name: Derrick [name] MRN: [id number] DOB: [birth year]    ADMISSION DATE:  06/12/2015 CONSULTATION DATE:  06/13/2015  REFERRING MD :  TRH  CHIEF COMPLAINT:  Hypercarbic respiratory failure and AMS  INITIAL PRESENTATION: 34 year old male with morbid obesity, OSA and OHV who presents to PCCM with AMS.  ABG was ordered and patient was noted to be hypercarbic and with respiratory acidosis.  Subsequently the patient developed hypoxemia as well.  Patient was transferred to the ICU and BiPAP was started.  F/U ABG post BiPAP showed no evidence of improvement and mental status continued to deteriorate so decision was made to intubate.  STUDIES:  CXR 11/13 with cardiomegally and pulmonary edema.  SIGNIFICANT EVENTS: 11/13 intubation for hypercarbic/hypoxic respiratory failure.   HISTORY OF PRESENT ILLNESS:  34 year old male with morbid obesity, OSA and OHV who presents to PCCM with AMS.  ABG was ordered and patient was noted to be hypercarbic and with respiratory acidosis.  Subsequently the patient developed hypoxemia as well.  Patient was transferred to the ICU and BiPAP was started.  F/U ABG post BiPAP showed no evidence of improvement and mental status continued to deteriorate so decision was made to intubate.  PAST MEDICAL HISTORY :   has a past medical history of Hypertension; Reflux; and Diabetes mellitus without complication (HCC).  has no past surgical history on file. Prior to Admission medications   Medication Sig Start Date End Date Taking? Authorizing Provider  aspirin 81 MG tablet Take 81 mg by mouth daily.   Yes Historical Provider, MD  furosemide (LASIX) 40 MG tablet Take 40 mg by mouth daily.   Yes Historical Provider, MD  glimepiride (AMARYL) 4 MG tablet Take 4 mg by mouth daily with breakfast.   Yes Historical Provider, MD  lisinopril-hydrochlorothiazide (PRINZIDE,ZESTORETIC) 20-25 MG per tablet Take 1 tablet by mouth daily.   Yes  Historical Provider, MD  loratadine (CLARITIN) 10 MG tablet Take 10 mg by mouth daily.   Yes Historical Provider, MD  metFORMIN (GLUCOPHAGE) 1000 MG tablet Take 1,000 mg by mouth 2 (two) times daily with a meal.   Yes Historical Provider, MD  albuterol (PROVENTIL HFA;VENTOLIN HFA) 108 (90 BASE) MCG/ACT inhaler Inhale 2 puffs into the lungs every 2 (two) hours as needed for wheezing or shortness of breath (cough). 06/19/14   Wayland Salinas, MD  lisinopril (PRINIVIL,ZESTRIL) 10 MG tablet Take 10 mg by mouth daily.    Historical Provider, MD  predniSONE (DELTASONE) 20 MG tablet 2 tabs po daily x 4 days 06/19/14   Wayland Salinas, MD  traMADol (ULTRAM) 50 MG tablet Take 1 tablet (50 mg total) by mouth every 6 (six) hours as needed for pain. 05/06/13   Geoffery Lyons, MD   Allergies  Allergen Reactions  . Other     An antibiotic  . Vancomycin   . Zosyn [Piperacillin Sod-Tazobactam So]     FAMILY HISTORY:  has no family status information on file.  SOCIAL HISTORY:  reports that he has never smoked. He has never used smokeless tobacco. He reports that he does not drink alcohol or use illicit drugs.  REVIEW OF SYSTEMS:  Unattainable, patient is altered.  SUBJECTIVE:   VITAL SIGNS: Temp:  [98 F (36.7 C)-98.9 F (37.2 C)] 98 F (36.7 C) (11/13 1208) Pulse Rate:  [92-112] 94 (11/13 1100) Resp:  [9-38] 26 (11/13 1100) BP: (119-193)/(71-154) 120/74 mmHg (11/13 1100) SpO2:  [84 %-100 %] 93 % (11/13 1100)  FiO2 (%):  [40 %-50 %] 40 % (11/13 1053) Weight:  [188.243 kg (415 lb)-200.898 kg (442 lb 14.4 oz)] 200.898 kg (442 lb 14.4 oz) (11/13 0550) HEMODYNAMICS:   VENTILATOR SETTINGS: Vent Mode:  [-] PCV;BIPAP FiO2 (%):  [40 %-50 %] 40 % Set Rate:  [12 bmp] 12 bmp PEEP:  [6 cmH20] 6 cmH20 INTAKE / OUTPUT:  Intake/Output Summary (Last 24 hours) at 06/13/15 1211 Last data filed at 06/13/15 0144  Gross per 24 hour  Intake    250 ml  Output      0 ml  Net    250 ml    PHYSICAL  EXAMINATION: General:  Acute on chronically ill appearing male in visible respiratory distress. Neuro:  Arousable but very lethargic morbidly obese male, moving all ext to command but very somnolent. HEENT:  [place]/AT, PERRL, EOM-I and MMM. Cardiovascular:  RRR, Nl S1/2, -M/R/G. Lungs:  Distant BS diffusely. Abdomen:  Soft, obese, ND and +BS. Musculoskeletal:  Bilateral lower ext edema, -tenderness. Skin:  Intact.  LABS:  CBC  Recent Labs Lab 06/12/15 1539 06/13/15 0505  WBC 10.2 12.2*  HGB 13.7 13.1  HCT 43.6 42.9  PLT 228 232   Coag's No results for input(s): APTT, INR in the last 168 hours. BMET  Recent Labs Lab 06/12/15 1539 06/13/15 0505  NA 136 134*  K 3.7 3.9  CL 94* 95*  CO2 36* 33*  BUN 8 10  CREATININE 0.67 1.05  GLUCOSE 211* 226*   Electrolytes  Recent Labs Lab 06/12/15 1539 06/13/15 0505  CALCIUM 8.8* 8.5*   Sepsis Markers  Recent Labs Lab 06/12/15 1556 06/12/15 1913  LATICACIDVEN 1.58 1.00   ABG  Recent Labs Lab 06/12/15 1554 06/13/15 0828 06/13/15 1148  PHART 7.364 7.232* 7.237*  PCO2ART 60.8* 88.5* 87.3*  PO2ART 69.0* 77.6* 81.7   Liver Enzymes  Recent Labs Lab 06/12/15 1539  AST 20  ALT 31  ALKPHOS 150*  BILITOT 1.0  ALBUMIN 3.8   Cardiac Enzymes  Recent Labs Lab 06/12/15 1539  TROPONINI <0.03   Glucose  Recent Labs Lab 06/12/15 1428 06/13/15 0125 06/13/15 0727 06/13/15 1045  GLUCAP 190* 193* 189* 183*    Imaging Dg Chest Port 1 View  06/12/2015  CLINICAL DATA:  Patient with hypoxemia. Also complaining of abdominal pain and nausea since yesterday. EXAM: PORTABLE CHEST 1 VIEW COMPARISON:  06/19/2014 FINDINGS: Mild to moderate enlargement of the cardiopericardial silhouette, stable. No mediastinal or hilar masses or evidence of adenopathy. Lungs are clear. No convincing pulmonary edema. No pleural effusion or pneumothorax. Bony thorax is unremarkable. IMPRESSION: No acute cardiopulmonary disease.  Electronically Signed   By: Amie Portland M.D.   On: 06/12/2015 16:06   Dg Abd Portable 1v  06/13/2015  CLINICAL DATA:  34 year old male with abdominal pain. EXAM: PORTABLE ABDOMEN - 1 VIEW COMPARISON:  None. FINDINGS: The bowel gas pattern is normal. No radio-opaque calculi or other significant radiographic abnormality are seen. IMPRESSION: Negative. Electronically Signed   By: Harmon Pier M.D.   On: 06/13/2015 09:33     ASSESSMENT / PLAN:  PULMONARY OETT 11/13>>> A: VDRF, hypercarbic, due to morbid obesity, OSA, OHV and CHF. P:   - Intubate. - Full vent support. - F/U ABG and CXR. - CXR and ABG in AM.  CARDIOVASCULAR R IJ PAC 11/13>>> A: CHF due to morbid obesity and likely pulmonary edema and pulmonary HTN. P:  - Lasix as ordered. - Continue home cardiac medications for now. - Place PAC catheter. -  2D echo. - Trend troponin.  RENAL A:  Hyponatremia, hypokalemia and increased bicarb. P:   - KVO IVF. - Lasix 40 mg IV q6 x3 doses. - Replace electrolytes as indicated. - BET in AM.  GASTROINTESTINAL A:  No active issues. P:   - Consult nutrition for TF as per nutrition. - Insert OGT.  HEMATOLOGIC A:  No active issues. P:  - CBC in AM. - Transfuse per ICU protocol.  INFECTIOUS A:  No evidence of infection. P:   - Monitor WBC and fever curve.  ENDOCRINE A:  DM.   P:   - ISS. - CBGs.  NEUROLOGIC A:  AMS due to hypercarbia, non-focal exam. P:   RASS goal: 0 Versed PRN Fentanyl PRN  FAMILY  - Updates: Mother and wife updated at length bedside.  TODAY'S SUMMARY: 34 year old male with CHF, OSA, OHV and likely pulmonary HTN.  Now in respiratory failure.  Will intubate, ventilate, place PAC for pulmonary artery pressure and will diurese aggressively.  The patient is critically ill with multiple organ systems failure and requires high complexity decision making for assessment and support, frequent evaluation and titration of therapies, application of  advanced monitoring technologies and extensive interpretation of multiple databases.   Critical Care Time devoted to patient care services described in this note is  35  Minutes. This time reflects time of care of this signee Dr Koren BoundWesam Yacoub. This critical care time does not reflect procedure time, or teaching time or supervisory time of PA/NP/Med student/Med Resident etc but could involve care discussion time.  Alyson ReedyWesam G. Yacoub, M.D. [facility]. Pager: [phone]. After hours pager: [phone].   06/13/2015, 12:11 PM

## 2015-06-13 NOTE — Progress Notes (Signed)
Patient very drowsy, and falls asleep easily.  ABGs reveal hypercapnia.  On 30% Bipap, he de-sats quickly to 65%.  States he has a head-ache.  Zenia ResidesPeter Babcock, NP, in to speak with patient, mother, and uncle.  They understand the plan of action going forward and have chosen to be aggressive with treatment.  Transferred to 2M05 via bed accompanied by staff.  Patient and family responding appropriately to current health decline..Marland Kitchen

## 2015-06-13 NOTE — Progress Notes (Addendum)
[facility] PROGRESS NOTE  Derrick [name] CVE:[id number] DOB: November 18, 1980 DOA: 06/12/2015 PCP: [facility]  Assessment/Plan: 1-Acute hypoxic, hypercapnic Respiratory Failure;  On BIPAP.  Repeat ABG. Might need CCM consult.  On Lovenox presume PE. Check doppler and D dimer.  V-Q scan ordered.  No leukocytosis, chest x ray negative for PNA.  BNP 86.  Addendum: ABG with worsening acidosis, Ph 7.2, Co 2 at 88. CCM consulted.   2-Abdominal pain. He denies pain at this time. LFT normal only mildly elevated Alk Phosphatase. RUQ [place] pending. Lipase normal. Check KUB. IV protonix.   3-Diabetes: SSI.   4-HTN; on lisinopril, Hydralazine PRN.    Code Status: Full code.  Family Communication: care discussed with patient.  Disposition Plan: remain inpatient.    Consultants:    Procedures:  V-Q scans.   Antibiotics: None  HPI/Subjective: He denies chest pain,  Per mother no recent history of fever, or URI.  Patient is sleepy, answer some questions. He denies abdominal pain. Last BM was on Thursday.   Objective: Filed Vitals:   06/13/15 0740  BP: 148/82  Pulse: 102  Temp:   Resp: 28    Intake/Output Summary (Last 24 hours) at 06/13/15 0805 Last data filed at 06/13/15 0144  Gross per 24 hour  Intake    250 ml  Output      0 ml  Net    250 ml   Filed Weights   06/12/15 1415 06/12/15 1926 06/13/15 0550  Weight: 188.243 kg (415 lb) 199.174 kg (439 lb 1.6 oz) 200.898 kg (442 lb 14.4 oz)    Exam:   General:  Sleepy, on BIPAP, say few words, moves extremities to command.   Cardiovascular: S 1, S 2 RRR  Respiratory: Bilateral air movements. On BIPAP  Abdomen: BS presents, obese, NT, Soft  Musculoskeletal: no edema.   Data Reviewed: Basic Metabolic Panel:  Recent Labs Lab 06/12/15 1539 06/13/15 0505  NA 136 134*  K 3.7 3.9  CL 94* 95*  CO2 36* 33*  GLUCOSE 211* 226*  BUN 8 10  CREATININE 0.67 1.05  CALCIUM 8.8*  8.5*   Liver Function Tests:  Recent Labs Lab 06/12/15 1539  AST 20  ALT 31  ALKPHOS 150*  BILITOT 1.0  PROT 7.9  ALBUMIN 3.8    Recent Labs Lab 06/12/15 1539  LIPASE 24   No results for input(s): AMMONIA in the last 168 hours. CBC:  Recent Labs Lab 06/12/15 1539 06/13/15 0505  WBC 10.2 12.2*  NEUTROABS 7.7  --   HGB 13.7 13.1  HCT 43.6 42.9  MCV 88.1 92.1  PLT 228 232   Cardiac Enzymes:  Recent Labs Lab 06/12/15 1539  TROPONINI <0.03   BNP (last 3 results)  Recent Labs  06/12/15 1539  BNP 86.7    ProBNP (last 3 results)  Recent Labs  06/19/14 1645  PROBNP 535.0*    CBG:  Recent Labs Lab 06/12/15 1428 06/13/15 0125 06/13/15 0727  GLUCAP 190* 193* 189*    Recent Results (from the past 240 hour(s))  MRSA PCR Screening     Status: None   Collection Time: 06/13/15 12:50 AM  Result Value Ref Range Status   MRSA by PCR NEGATIVE NEGATIVE Final    Comment:        The GeneXpert MRSA Assay (FDA approved for NASAL specimens only), is one component of a comprehensive MRSA colonization surveillance program. It is not intended to diagnose MRSA infection nor to  guide or monitor treatment for MRSA infections.      Studies: Dg Chest Port 1 View  06/12/2015  CLINICAL DATA:  Patient with hypoxemia. Also complaining of abdominal pain and nausea since yesterday. EXAM: PORTABLE CHEST 1 VIEW COMPARISON:  06/19/2014 FINDINGS: Mild to moderate enlargement of the cardiopericardial silhouette, stable. No mediastinal or hilar masses or evidence of adenopathy. Lungs are clear. No convincing pulmonary edema. No pleural effusion or pneumothorax. Bony thorax is unremarkable. IMPRESSION: No acute cardiopulmonary disease. Electronically Signed   By: Lajean Manes M.D.   On: 06/12/2015 16:06    Scheduled Meds: . enoxaparin (LOVENOX) injection  1 mg/kg Subcutaneous Q12H  . furosemide  40 mg Oral Daily  . hydrochlorothiazide  25 mg Oral Daily  . insulin  aspart  0-20 Units Subcutaneous TID WC  . insulin aspart  0-5 Units Subcutaneous QHS  . lisinopril  20 mg Oral Daily  . loratadine  10 mg Oral Daily  . sodium chloride  3 mL Intravenous Q12H   Continuous Infusions:   Active Problems:   CO2 retention   Hypoxemia    Time spent: 25 minutes.     Niel Hummer A  [facility] Pager [phone]. If 7PM-7AM, please contact night-coverage at [email or web address], password [facility] 06/13/2015, 8:05 AM  LOS: 1 day

## 2015-06-14 ENCOUNTER — Inpatient Hospital Stay (HOSPITAL_COMMUNITY): Payer: 59

## 2015-06-14 DIAGNOSIS — I509 Heart failure, unspecified: Secondary | ICD-10-CM

## 2015-06-14 DIAGNOSIS — R0902 Hypoxemia: Secondary | ICD-10-CM

## 2015-06-14 LAB — POCT I-STAT 3, ART BLOOD GAS (G3+)
Acid-Base Excess: 9 mmol/L — ABNORMAL HIGH (ref 0.0–2.0)
BICARBONATE: 32.4 meq/L — AB (ref 20.0–24.0)
O2 Saturation: 90 %
PCO2 ART: 39.1 mmHg (ref 35.0–45.0)
PO2 ART: 51 mmHg — AB (ref 80.0–100.0)
Patient temperature: 98
TCO2: 34 mmol/L (ref 0–100)
pH, Arterial: 7.525 — ABNORMAL HIGH (ref 7.350–7.450)

## 2015-06-14 LAB — URINE CULTURE: Culture: NO GROWTH

## 2015-06-14 LAB — CBC
HCT: 39.5 % (ref 39.0–52.0)
Hemoglobin: 12.1 g/dL — ABNORMAL LOW (ref 13.0–17.0)
MCH: 28.3 pg (ref 26.0–34.0)
MCHC: 30.6 g/dL (ref 30.0–36.0)
MCV: 92.3 fL (ref 78.0–100.0)
PLATELETS: 193 10*3/uL (ref 150–400)
RBC: 4.28 MIL/uL (ref 4.22–5.81)
RDW: 15.4 % (ref 11.5–15.5)
WBC: 9.8 10*3/uL (ref 4.0–10.5)

## 2015-06-14 LAB — GLUCOSE, CAPILLARY
GLUCOSE-CAPILLARY: 112 mg/dL — AB (ref 65–99)
GLUCOSE-CAPILLARY: 113 mg/dL — AB (ref 65–99)
GLUCOSE-CAPILLARY: 117 mg/dL — AB (ref 65–99)
GLUCOSE-CAPILLARY: 124 mg/dL — AB (ref 65–99)
Glucose-Capillary: 110 mg/dL — ABNORMAL HIGH (ref 65–99)
Glucose-Capillary: 129 mg/dL — ABNORMAL HIGH (ref 65–99)

## 2015-06-14 LAB — MAGNESIUM: Magnesium: 1.8 mg/dL (ref 1.7–2.4)

## 2015-06-14 LAB — BASIC METABOLIC PANEL
ANION GAP: 10 (ref 5–15)
BUN: 11 mg/dL (ref 6–20)
CALCIUM: 8.8 mg/dL — AB (ref 8.9–10.3)
CO2: 35 mmol/L — ABNORMAL HIGH (ref 22–32)
CREATININE: 1.17 mg/dL (ref 0.61–1.24)
Chloride: 98 mmol/L — ABNORMAL LOW (ref 101–111)
GFR calc Af Amer: >60 mL/min (ref >60)
GLUCOSE: 97 mg/dL (ref 65–99)
Potassium: 3.4 mmol/L — ABNORMAL LOW (ref 3.5–5.1)
Sodium: 143 mmol/L (ref 135–145)

## 2015-06-14 LAB — HEMOGLOBIN A1C
Hgb A1c MFr Bld: 10.1 % — ABNORMAL HIGH (ref 4.8–5.6)
MEAN PLASMA GLUCOSE: 243 mg/dL

## 2015-06-14 LAB — TROPONIN I: Troponin I: <0.03 ng/mL (ref <0.031)

## 2015-06-14 LAB — PHOSPHORUS: Phosphorus: 2.2 mg/dL — ABNORMAL LOW (ref 2.5–4.6)

## 2015-06-14 MED ORDER — CHLORHEXIDINE GLUCONATE 0.12 % MT SOLN
15.0000 mL | Freq: Two times a day (BID) | OROMUCOSAL | Status: DC
  Administered 2015-06-14 – 2015-06-29 (×31): 15 mL via OROMUCOSAL
  Filled 2015-06-14 (×5): qty 15

## 2015-06-14 MED ORDER — POTASSIUM CHLORIDE 20 MEQ/15ML (10%) PO SOLN
40.0000 meq | Freq: Two times a day (BID) | ORAL | Status: AC
  Administered 2015-06-14: 40 meq
  Filled 2015-06-14 (×2): qty 30

## 2015-06-14 MED ORDER — POLYETHYLENE GLYCOL 3350 17 G PO PACK
17.0000 g | PACK | Freq: Every day | ORAL | Status: DC
  Administered 2015-06-14 – 2015-06-30 (×11): 17 g via ORAL
  Filled 2015-06-14 (×15): qty 1

## 2015-06-14 MED ORDER — POTASSIUM CHLORIDE 20 MEQ/15ML (10%) PO SOLN
20.0000 meq | Freq: Two times a day (BID) | ORAL | Status: DC
  Administered 2015-06-14: 20 meq
  Filled 2015-06-14: qty 15

## 2015-06-14 MED ORDER — VITAL HIGH PROTEIN PO LIQD
1000.0000 mL | ORAL | Status: DC
  Administered 2015-06-14 – 2015-06-15 (×3): 1000 mL
  Administered 2015-06-15: 07:00:00
  Administered 2015-06-16 (×2): 1000 mL
  Administered 2015-06-17: 90 mL
  Administered 2015-06-18: 1000 mL
  Administered 2015-06-18: 90 mL/h
  Administered 2015-06-19 – 2015-06-20 (×4): 1000 mL
  Administered 2015-06-20 (×2)
  Administered 2015-06-20 – 2015-06-22 (×5): 1000 mL
  Administered 2015-06-22: 02:00:00
  Administered 2015-06-23 – 2015-06-28 (×9): 1000 mL
  Filled 2015-06-14 (×22): qty 1000

## 2015-06-14 MED ORDER — HEPARIN (PORCINE) IN NACL 100-0.45 UNIT/ML-% IJ SOLN
1900.0000 [IU]/h | INTRAMUSCULAR | Status: DC
  Administered 2015-06-14: 1900 [IU]/h via INTRAVENOUS
  Filled 2015-06-14 (×3): qty 250

## 2015-06-14 MED ORDER — VITAL HIGH PROTEIN PO LIQD
1000.0000 mL | ORAL | Status: DC
  Filled 2015-06-14 (×2): qty 1000

## 2015-06-14 MED ORDER — MAGNESIUM HYDROXIDE 400 MG/5ML PO SUSP
15.0000 mL | Freq: Once | ORAL | Status: AC
  Administered 2015-06-14: 15 mL via ORAL
  Filled 2015-06-14: qty 30

## 2015-06-14 MED ORDER — PERFLUTREN LIPID MICROSPHERE
1.0000 mL | INTRAVENOUS | Status: AC | PRN
  Administered 2015-06-14 (×3): 2 mL via INTRAVENOUS
  Filled 2015-06-14: qty 10

## 2015-06-14 MED ORDER — METOLAZONE 5 MG PO TABS
5.0000 mg | ORAL_TABLET | Freq: Once | ORAL | Status: AC
  Administered 2015-06-14: 5 mg via ORAL
  Filled 2015-06-14: qty 1

## 2015-06-14 MED ORDER — PRO-STAT SUGAR FREE PO LIQD
30.0000 mL | Freq: Two times a day (BID) | ORAL | Status: AC
  Administered 2015-06-14 (×2): 30 mL
  Filled 2015-06-14 (×2): qty 30

## 2015-06-14 MED ORDER — FUROSEMIDE 10 MG/ML IJ SOLN
40.0000 mg | Freq: Two times a day (BID) | INTRAMUSCULAR | Status: DC
  Administered 2015-06-14: 40 mg via INTRAVENOUS
  Filled 2015-06-14: qty 4

## 2015-06-14 MED ORDER — PANTOPRAZOLE SODIUM 40 MG IV SOLR
40.0000 mg | Freq: Every day | INTRAVENOUS | Status: DC
  Administered 2015-06-15: 40 mg via INTRAVENOUS
  Filled 2015-06-14: qty 40

## 2015-06-14 MED ORDER — FUROSEMIDE 10 MG/ML IJ SOLN
8.0000 mg/h | INTRAVENOUS | Status: DC
  Administered 2015-06-14 – 2015-06-15 (×2): 10 mg/h via INTRAVENOUS
  Administered 2015-06-17: 4 mg/h via INTRAVENOUS
  Filled 2015-06-14 (×4): qty 25

## 2015-06-14 NOTE — Progress Notes (Signed)
ANTICOAGULATION CONSULT NOTE  Pharmacy Consult for heparin Indication: r/o PE  Allergies  Allergen Reactions  . Vancomycin Other (See Comments)  . Zosyn [Piperacillin Sod-Tazobactam So] Other (See Comments)    Patient Measurements: Height: 5\' 10"  (177.8 cm) Weight: (!) 434 lb 12 oz (197.2 kg) IBW/kg (Calculated) : 73 Heparin Dosing Weight: 122 kg  Vital Signs: Temp: 99.9 F (37.7 C) (11/14 1400) BP: 131/90 mmHg (11/14 1400) Pulse Rate: 103 (11/14 1400)  Labs:  Recent Labs  06/12/15 1539 06/13/15 0505 06/13/15 1550 06/13/15 2201 06/14/15 0051 06/14/15 0835  HGB 13.7 13.1  --   --   --  12.1*  HCT 43.6 42.9  --   --   --  39.5  PLT 228 232  --   --   --  193  CREATININE 0.67 1.05  --   --   --  1.17  TROPONINI <0.03  --  <0.03 <0.03 <0.03  --     Estimated Creatinine Clearance: 155.9 mL/min (by C-G formula based on Cr of 1.17).   Medical History: Past Medical History  Diagnosis Date  . Hypertension   . Reflux   . Diabetes mellitus without complication (HCC)     Medications:  Prescriptions prior to admission  Medication Sig Dispense Refill Last Dose  . albuterol (PROVENTIL HFA;VENTOLIN HFA) 108 (90 BASE) MCG/ACT inhaler Inhale 2 puffs into the lungs every 2 (two) hours as needed for wheezing or shortness of breath (cough). 1 Inhaler 0 not used  . allopurinol (ZYLOPRIM) 300 MG tablet Take 300 mg by mouth daily.   Past Week at Unknown time  . aspirin 81 MG tablet Take 81 mg by mouth daily.   Past Week at Unknown time  . furosemide (LASIX) 40 MG tablet Take 40 mg by mouth daily.   Past Week at Unknown time  . glimepiride (AMARYL) 4 MG tablet Take 4 mg by mouth daily with breakfast.   Past Week at Unknown time  . lisinopril-hydrochlorothiazide (PRINZIDE,ZESTORETIC) 20-25 MG per tablet Take 1 tablet by mouth daily.   Past Week at Unknown time  . loratadine (CLARITIN) 10 MG tablet Take 10 mg by mouth daily.   Past Week at Unknown time    Assessment: 34 yo man w/  acute hypoxic, hypercapnic resp failure. Lovenox will be transitioned to heparin. Unable to confirm PE, cannot perform V/Q or CTA at this time.  Continue heparin until PE ruled out. Pt having small amount of bleeding from Swanz catheter- resolving. CBC stable.   Goal of Therapy:  Anti-Xa level 0.6-1 units/ml 4hrs after LMWH dose given Monitor platelets by anticoagulation protocol: Yes  Heparin goal: 0.3-0.7 units/ml   Plan:  -Stop Lovenox -Heparin 1900 units/hr  -Daily HL, CBC -First level this evening -Monitor s/sx bleeding    Agapito GamesAlison July Nickson, PharmD, BCPS Clinical Pharmacist Pager: [phone] 06/14/2015 2:55 PM

## 2015-06-14 NOTE — Progress Notes (Addendum)
PULMONARY / CRITICAL CARE MEDICINE   Name: Derrick [name] MRN: [id number] DOB: Nov 15, 1980    ADMISSION DATE:  06/12/2015 CONSULTATION DATE:  06/13/2015  REFERRING MD :  TRH  CHIEF COMPLAINT:  Hypercarbic respiratory failure and AMS  INITIAL PRESENTATION: 34 year old male with morbid obesity, OSA and OHV who presents to PCCM with AMS.  ABG was ordered and patient was noted to be hypercarbic and with respiratory acidosis.  Subsequently the patient developed hypoxemia as well.  Patient was transferred to the ICU and BiPAP was started.  F/U ABG post BiPAP showed no evidence of improvement and mental status continued to deteriorate so decision was made to intubate.  STUDIES:  CXR 11/13- with cardiomegally and pulmonary edema. Venous doppler 11/13- without DVT, superficial thrombosis, or Baker's cyst CXR 11/14- Hypoventilation with bibasilar atelectasis.   SIGNIFICANT EVENTS: 11/13 intubation for hypercarbic/hypoxic respiratory failure.  SUBJECTIVE:   Intubated. 10 beat run of Vtach overnight. No abdominal pain. Endorses PND, orthopnea, and swelling prior to this admission.  RASS score -1  VITAL SIGNS: Temp:  [98 F (36.7 C)-99.9 F (37.7 C)] 99.1 F (37.3 C) (11/14 0600) Pulse Rate:  [79-103] 84 (11/14 0600) Resp:  [0-35] 14 (11/14 0600) BP: (112-156)/(50-105) 112/65 mmHg (11/14 0600) SpO2:  [85 %-100 %] 99 % (11/14 0600) FiO2 (%):  [40 %-50 %] 50 % (11/14 0327) Weight:  [434 lb 12 oz (197.2 kg)] 434 lb 12 oz (197.2 kg) (11/14 0600) HEMODYNAMICS: PAP: (29-55)/(3-29) 33/7 mmHg CVP:  [6 mmHg-32 mmHg] 8 mmHg CO:  [8.4 L/min] 8.4 L/min CI:  [2.8 L/min/m2] 2.8 L/min/m2 VENTILATOR SETTINGS: Vent Mode:  [-] PRVC FiO2 (%):  [40 %-50 %] 50 % Set Rate:  [12 bmp-22 bmp] 14 bmp Vt Set:  [580 mL] 580 mL PEEP:  [5 cmH20-6 cmH20] 5 cmH20 Plateau Pressure:  [26 cmH20-30 cmH20] 26 cmH20 INTAKE / OUTPUT:  Intake/Output Summary (Last 24 hours) at 06/14/15 4098 Last data filed at  06/14/15 0600  Gross per 24 hour  Intake 708.33 ml  Output   1375 ml  Net -666.67 ml    PHYSICAL EXAMINATION: General:  Acute on chronically ill appearing male in NAD.  Neuro:  Arouseable, answers questions appropriately.   HEENT:  [place]/AT, PERRL, Cardiovascular:  Distant heart sounds.  RRR, Nl S1/2, No M/R/G. Lungs:  Coarse breath sounds diffusely.  Abdomen:  Soft, obese, ND and +BS. Musculoskeletal:  Bilateral lower ext edema, -tenderness. Skin:  Intact.  LABS:  CBC  Recent Labs Lab 06/12/15 1539 06/13/15 0505  WBC 10.2 12.2*  HGB 13.7 13.1  HCT 43.6 42.9  PLT 228 232   Coag's No results for input(s): APTT, INR in the last 168 hours. BMET  Recent Labs Lab 06/12/15 1539 06/13/15 0505  NA 136 134*  K 3.7 3.9  CL 94* 95*  CO2 36* 33*  BUN 8 10  CREATININE 0.67 1.05  GLUCOSE 211* 226*   Electrolytes  Recent Labs Lab 06/12/15 1539 06/13/15 0505  CALCIUM 8.8* 8.5*   Sepsis Markers  Recent Labs Lab 06/12/15 1556 06/12/15 1913  LATICACIDVEN 1.58 1.00   ABG  Recent Labs Lab 06/13/15 1148 06/13/15 1441 06/14/15 0321  PHART 7.237* 7.410 7.525*  PCO2ART 87.3* 52.4* 39.1  PO2ART 81.7 73.8* 51.0*   Liver Enzymes  Recent Labs Lab 06/12/15 1539  AST 20  ALT 31  ALKPHOS 150*  BILITOT 1.0  ALBUMIN 3.8   Cardiac Enzymes  Recent Labs Lab 06/13/15 1550 06/13/15 2201 06/14/15 0051  TROPONINI <  0.03 <0.03 <0.03   Glucose  Recent Labs Lab 06/13/15 1045 06/13/15 1209 06/13/15 1546 06/13/15 1952 06/13/15 2357 06/14/15 0409  GLUCAP 183* 170* 142* 122* 112* 110*    Imaging Dg Abd 1 View  06/13/2015  CLINICAL DATA:  Status post NG tube placement today. EXAM: ABDOMEN - 1 VIEW COMPARISON:  None. FINDINGS: NG tube is in place with the tip in the distal stomach in good position. IMPRESSION: As above. Electronically Signed   By: Drusilla Kannerhomas  Dalessio M.D.   On: 06/13/2015 15:10   [place] Abdomen Complete  06/13/2015  CLINICAL DATA:  Right upper  quadrant pain EXAM: ULTRASOUND ABDOMEN COMPLETE COMPARISON:  None. FINDINGS: Gallbladder: No gallstones or wall thickening visualized. No sonographic Murphy sign noted. Common bile duct: Diameter: 5 mm Liver: Increased in echogenicity consistent with fatty infiltration. No focal mass lesion is noted. IVC: No abnormality visualized. Pancreas: Visualized portion unremarkable. Spleen: Size and appearance within normal limits. Right Kidney: Length: 13.2 cm. Echogenicity within normal limits. No mass or hydronephrosis visualized. Left Kidney: Length: 13.3 cm. Echogenicity within normal limits. No mass or hydronephrosis visualized. Abdominal aorta: No aneurysm visualized. Other findings: None. IMPRESSION: Fatty liver.  No acute abnormality is noted. Electronically Signed   By: Alcide CleverMark  Lukens M.D.   On: 06/13/2015 18:13   Dg Chest Port 1 View  06/13/2015  CLINICAL DATA:  Central line placement. EXAM: PORTABLE CHEST 1 VIEW COMPARISON:  Chest x-ray dated 06/12/2015. FINDINGS: Endotracheal tube has been placed with tip well positioned just above the level of the carina. Right internal jugular Swan-Ganz catheter appears adequately positioned with tip just to the left of midline. Swan-Ganz catheter confirmed with patient's nurse Juliette AlcideMelinda. Enteric tube passes below diaphragm. Cardiomegaly appears stable.  Lungs are clear.  No pneumothorax. IMPRESSION: Right internal jugular Swan-Ganz catheter appears adequately positioned with tip just to the left of midline. No pneumothorax or other procedural complicating feature. Stable cardiomegaly. Endotracheal tube well positioned with tip just above the level of the carina. Electronically Signed   By: Bary RichardStan  Maynard M.D.   On: 06/13/2015 15:19   Dg Abd Portable 1v  06/13/2015  CLINICAL DATA:  34 year old male with abdominal pain. EXAM: PORTABLE ABDOMEN - 1 VIEW COMPARISON:  None. FINDINGS: The bowel gas pattern is normal. No radio-opaque calculi or other significant radiographic  abnormality are seen. IMPRESSION: Negative. Electronically Signed   By: Harmon PierJeffrey  Hu M.D.   On: 06/13/2015 09:33     ASSESSMENT / PLAN:  PULMONARY OETT 11/13>>> A: VDRF, hypercarbic, due to morbid obesity, OSA, OHV and CHF. P:   - Full vent support. - VAP - F/U ABG and CXR. - Consider decreasing RR to 12 given abg  CARDIOVASCULAR R IJ PAC 11/13>>> A: CHF due to morbid obesity and likely pulmonary edema and pulmonary HTN. Troponins negative P:  - Lasix 40 IV BID - Continue home cardiac medications for now - 2D echo ordered   RENAL A:  Hyponatremia, hypokalemia and increased bicarb. P:   - KVO IVF. - Replace electrolytes as indicated. - BMET in AM  GASTROINTESTINAL A:  No active issues. P:   - Consult nutrition for TF as per nutrition. - Insert OGT.  HEMATOLOGIC A:  No active issues. P:  - CBC in AM. - Transfuse per ICU protocol.  INFECTIOUS A:  No evidence of infection. P:   - Monitor WBC and fever curve.  ENDOCRINE A:  DM.   P:   - SSI - CBGs.  NEUROLOGIC A:  AMS due  to hypercarbia, non-focal exam. P:   RASS goal: 0 Versed PRN Fentanyl PRN  FAMILY  - Updates: Mother and wife updated at length bedside.  Joanna Puff, MD [facility] Resident  06/14/2015, 8:22 AM  Attending Note:  34 year old male with PMH of morbid obesity and OHV.  Presented with abdominal pain then developed acute respiratory failure (hypercarbic) due to CHF and pulmonary HTN.  Swan in place.  Arousable on exam with very distant BS.  Rewedged the swan and check volumes.  Will add lasix drip at 10 mg/hr and zaroxolyn 5 mg PO x2 with KCl replacement.  Change protonix to once a day.  Change lovenox to heparin drip.  Check D-dimer if negative will change to SQ if positive then will need V/Q scan if can fit in scanner. In the meantime, will continue diureses and monitor.  Decrease RR to 10.  ABG and CXR in AM.   The patient is critically ill with multiple organ systems  failure and requires high complexity decision making for assessment and support, frequent evaluation and titration of therapies, application of advanced monitoring technologies and extensive interpretation of multiple databases.   Critical Care Time devoted to patient care services described in this note is  35  Minutes. This time reflects time of care of this signee Dr Koren Bound. This critical care time does not reflect procedure time, or teaching time or supervisory time of PA/NP/Med student/Med Resident etc but could involve care discussion time.  Alyson Reedy, M.D. [facility] Pulmonary/Critical Care Medicine. Pager: [phone]. After hours pager: [phone].

## 2015-06-14 NOTE — Care Management Note (Signed)
Case Management Note  Patient Details  Name: Mitzi HansenReginald A Polka MRN: [id number] Date of Birth: 1981-04-08  Subjective/Objective:  34 y.o M admitted originally as SD pt   With AMS. Cardiomegally, Pulmonary Edema and Bibasillar Atelectasis. Intubated and placed on Vent. Currently in ICU. Wife at bedside.                 Action/Plan:CM will follow for disposition/discharge needs.    Expected Discharge Date:                  Expected Discharge Plan:     In-House Referral:     Discharge planning Services  CM Consult  Post Acute Care Choice:    Choice offered to:     DME Arranged:    DME Agency:     HH Arranged:    HH Agency:     Status of Service:  In process, will continue to follow  Medicare Important Message Given:    Date Medicare IM Given:    Medicare IM give by:    Date Additional Medicare IM Given:    Additional Medicare Important Message give by:     If discussed at Long Length of Stay Meetings, dates discussed:    Additional Comments:  Yvone NeuCrutchfield, Ashlan Dignan M, RN 06/14/2015, 9:04 AM

## 2015-06-14 NOTE — Progress Notes (Signed)
Pt refused bath halfway through. Refused linen change and gown change.

## 2015-06-14 NOTE — Progress Notes (Signed)
Initial Nutrition Assessment  DOCUMENTATION CODES:   Morbid obesity  INTERVENTION:    Initiate TF via OGT with Vital High Protein at 25 ml/h and Prostat 30 ml BID on day 1; on day 2, d/c Prostat and increase to goal rate of 90 ml/h (2160 ml per day) to provide 2160 kcals, 189 gm protein, 1806 ml free water daily.  NUTRITION DIAGNOSIS:   Inadequate oral intake related to inability to eat as evidenced by NPO status.  GOAL:   Provide needs based on ASPEN/SCCM guidelines  MONITOR:   Labs, Weight trends, TF tolerance, Vent status, I & O's  REASON FOR ASSESSMENT:   Consult Enteral/tube feeding initiation and management  ASSESSMENT:   34 y.o. male with a history of Morbid Obesity, DM2, HTN, OSA who presented to the ED with complaints of colicky RUQ ABD Pain x 2-3 days. He denies any Nausea and Vomiting or Diarrhea, He reports his last BM was 3 days ago. He was evaluated in the ED and was found to have hypoxemia with O2sats in the 80's. He was also found to have hypercarbia with a pCO2 = 60.8. Due to weight constraints of the CT scanner a CTA of the Chest And ABD was not able to be performed and he was transferred for further workup.   Labs reviewed: potassium and phosphorus are low.  Patient is currently intubated on ventilator support. Received MD Consult for TF initiation and management.   Temp (24hrs), Avg:99.3 F (37.4 C), Min:98 F (36.7 C), Max:99.9 F (37.7 C)   Diet Order:  Diet NPO time specified  Skin:  Reviewed, no issues  Last BM:  11/11  Height:   Ht Readings from Last 1 Encounters:  06/13/15 5\' 10"  (1.778 m)    Weight:   Wt Readings from Last 1 Encounters:  06/14/15 434 lb 12 oz (197.2 kg)    Ideal Body Weight:  75.5 kg  BMI:  Body mass index is 62.38 kg/(m^2).  Estimated Nutritional Needs:   Kcal:  2170-2762  Protein:  189 gm  Fluid:  2.2-2.7 L  EDUCATION NEEDS:   No education needs identified at this time   Joaquin CourtsKimberly  Venancio Chenier, RD, LDN, CNSC Pager [phone] After Hours Pager [phone]

## 2015-06-14 NOTE — Progress Notes (Signed)
Echocardiogram 2D Echocardiogram with Definity has been performed.  Derrick [name], Derrick [name] 06/14/2015, 3:50 PM

## 2015-06-15 ENCOUNTER — Inpatient Hospital Stay (HOSPITAL_COMMUNITY): Payer: 59

## 2015-06-15 LAB — BLOOD GAS, ARTERIAL
ACID-BASE EXCESS: 12 mmol/L — AB (ref 0.0–2.0)
ACID-BASE EXCESS: 13.6 mmol/L — AB (ref 0.0–2.0)
BICARBONATE: 39 meq/L — AB (ref 20.0–24.0)
Bicarbonate: 38.7 mEq/L — ABNORMAL HIGH (ref 20.0–24.0)
DRAWN BY: 27407
DRAWN BY: 27407
FIO2: 0.4
FIO2: 40
LHR: 14 {breaths}/min
O2 SAT: 93.6 %
O2 Saturation: 93.8 %
PCO2 ART: 85.7 mmHg — AB (ref 35.0–45.0)
PCO2 ART: 63.6 mmHg — AB (ref 35.0–45.0)
PEEP: 5 cmH2O
PEEP: 5 cmH2O
PH ART: 7.283 — AB (ref 7.350–7.450)
Patient temperature: 100.2
Patient temperature: 100.2
RATE: 10 resp/min
TCO2: 41.2 mmol/L (ref 0–100)
TCO2: 41 mmol/L (ref 0–100)
VT: 580 mL
VT: 580 mL
pH, Arterial: 7.405 (ref 7.350–7.450)
pO2, Arterial: 80.3 mmHg (ref 80.0–100.0)
pO2, Arterial: 68.2 mmHg — ABNORMAL LOW (ref 80.0–100.0)

## 2015-06-15 LAB — CBC
HCT: 42 % (ref 39.0–52.0)
Hemoglobin: 12.4 g/dL — ABNORMAL LOW (ref 13.0–17.0)
MCH: 27.9 pg (ref 26.0–34.0)
MCHC: 29.5 g/dL — ABNORMAL LOW (ref 30.0–36.0)
MCV: 94.4 fL (ref 78.0–100.0)
PLATELETS: 202 10*3/uL (ref 150–400)
RBC: 4.45 MIL/uL (ref 4.22–5.81)
RDW: 15.2 % (ref 11.5–15.5)
WBC: 11.7 10*3/uL — ABNORMAL HIGH (ref 4.0–10.5)

## 2015-06-15 LAB — GLUCOSE, CAPILLARY
GLUCOSE-CAPILLARY: 157 mg/dL — AB (ref 65–99)
GLUCOSE-CAPILLARY: 148 mg/dL — AB (ref 65–99)
GLUCOSE-CAPILLARY: 148 mg/dL — AB (ref 65–99)
Glucose-Capillary: 147 mg/dL — ABNORMAL HIGH (ref 65–99)
Glucose-Capillary: 164 mg/dL — ABNORMAL HIGH (ref 65–99)
Glucose-Capillary: 169 mg/dL — ABNORMAL HIGH (ref 65–99)

## 2015-06-15 LAB — BASIC METABOLIC PANEL
Anion gap: 10 (ref 5–15)
BUN: 17 mg/dL (ref 6–20)
CALCIUM: 8.7 mg/dL — AB (ref 8.9–10.3)
CO2: 37 mmol/L — AB (ref 22–32)
CREATININE: 1.38 mg/dL — AB (ref 0.61–1.24)
Chloride: 91 mmol/L — ABNORMAL LOW (ref 101–111)
GFR calc Af Amer: >60 mL/min (ref >60)
GLUCOSE: 170 mg/dL — AB (ref 65–99)
Potassium: 3.9 mmol/L (ref 3.5–5.1)
Sodium: 138 mmol/L (ref 135–145)

## 2015-06-15 LAB — HEPARIN LEVEL (UNFRACTIONATED): Heparin Unfractionated: 0.48 IU/mL (ref 0.30–0.70)

## 2015-06-15 MED ORDER — PANTOPRAZOLE SODIUM 40 MG PO PACK
40.0000 mg | PACK | ORAL | Status: DC
  Administered 2015-06-16 – 2015-06-30 (×15): 40 mg
  Filled 2015-06-15 (×14): qty 20

## 2015-06-15 MED ORDER — HEPARIN SODIUM (PORCINE) 5000 UNIT/ML IJ SOLN
5000.0000 [IU] | Freq: Three times a day (TID) | INTRAMUSCULAR | Status: DC
  Administered 2015-06-15 – 2015-06-30 (×45): 5000 [IU] via SUBCUTANEOUS
  Filled 2015-06-15 (×54): qty 1

## 2015-06-15 MED ORDER — ACETAMINOPHEN 160 MG/5ML PO SOLN: 650.0000 mg | Freq: Four times a day (QID) | ORAL | Status: DC | PRN

## 2015-06-15 NOTE — Progress Notes (Signed)
Inpatient Diabetes Program Recommendations  AACE/ADA: New Consensus Statement on Inpatient Glycemic Control (2015)  Target Ranges:  Prepandial:   less than 140 mg/dL      Peak postprandial:   less than 180 mg/dL (1-2 hours)      Critically ill patients:  140 - 180 mg/dL   Review of Glycemic Control  Results for Derrick [name], Derrick [name] (MRN [id number]) as of 06/15/2015 13:20  Ref. Range 06/14/2015 19:30 06/14/2015 23:56 06/15/2015 03:55 06/15/2015 07:42 06/15/2015 11:57  Glucose-Capillary Latest Ref Range: 65-99 mg/dL 409124 (H) 811147 (H) 914157 (H) 148 (H) 169 (H)   Results for Derrick [name], Derrick [name] (MRN [id number]) as of 06/15/2015 13:20  Ref. Range 06/13/2015 05:05  Hemoglobin A1C Latest Ref Range: 4.8-5.6 % 10.1 (H)   Diabetes history: Type 2 Outpatient Diabetes medications: Amaryl 4mg /day Current orders for Inpatient glycemic control: Novolog 2-6 units q6h  Inpatient Diabetes Program Recommendations: Agree with current orders for diabetes medication management.   Susette RacerJulie Nyeisha Goodall, RN, BA, MHA, CDE Diabetes Coordinator Inpatient Diabetes Program  [phone] (Team Pager) [phone] [facility] Office) 06/15/2015 1:31 PM

## 2015-06-15 NOTE — Progress Notes (Signed)
RR increased to 14 due to increased CO2 on ABG per MD

## 2015-06-15 NOTE — Progress Notes (Signed)
ANTICOAGULATION CONSULT NOTE - Follow Up Consult  Pharmacy Consult for Heparin  Indication: Rule out PE  Allergies  Allergen Reactions  . Vancomycin Other (See Comments)  . Zosyn [Piperacillin Sod-Tazobactam So] Other (See Comments)    Patient Measurements: Height: 5\' 10"  (177.8 cm) Weight: (!) 434 lb 12 oz (197.2 kg) IBW/kg (Calculated) : 73  Vital Signs: Temp: 100.6 F (38.1 C) (11/15 0200) Temp Source: Oral (11/14 1521) BP: 111/62 mmHg (11/15 0200) Pulse Rate: 107 (11/15 0200)  Labs:  Recent Labs  06/12/15 1539 06/13/15 0505 06/13/15 1550 06/13/15 2201 06/14/15 0051 06/14/15 0835 06/15/15 0224  HGB 13.7 13.1  --   --   --  12.1*  --   HCT 43.6 42.9  --   --   --  39.5  --   PLT 228 232  --   --   --  193  --   HEPARINUNFRC  --   --   --   --   --   --  0.48  CREATININE 0.67 1.05  --   --   --  1.17  --   TROPONINI <0.03  --  <0.03 <0.03 <0.03  --   --     Estimated Creatinine Clearance: 155.9 mL/min (by C-G formula based on Cr of 1.17).   Assessment: Initial heparin level is therapeutic after transitioning from Lovenox to Heparin  Goal of Therapy:  Heparin level 0.3-0.7 units/ml Monitor platelets by anticoagulation protocol: Yes   Plan:  -Continue heparin at 1900 units/hr -Confirmatory HL at 1000  Abran DukeLedford, Caulin Begley 06/15/2015,3:08 AM

## 2015-06-15 NOTE — Progress Notes (Signed)
**  Critical Care Interval Note**  Intubated.  Sedated.  DVT treatment discontinued.  ABG after vent setting change improved pH 7.405, pCO2 63.6, bicarb 39.0.  Switched to VTE ppx.BP 100/65, HR 94, saturating 94% on vent.  ABG, CBC, CMET ordered for tomorrow am.  Will continue to monitor closely.  Ashly M. Nadine CountsGottschalk, DO PGY-2, [facility]

## 2015-06-15 NOTE — Progress Notes (Signed)
PULMONARY / CRITICAL CARE MEDICINE   Name: Derrick [name] MRN: [id number] DOB: 07/12/1981    ADMISSION DATE:  06/12/2015 CONSULTATION DATE:  06/13/2015  REFERRING MD :  TRH  CHIEF COMPLAINT:  Hypercarbic respiratory failure and AMS  INITIAL PRESENTATION: 34 year old male with morbid obesity, OSA and OHV who presents to PCCM with AMS.  ABG was ordered and patient was noted to be hypercarbic and with respiratory acidosis.  Subsequently the patient developed hypoxemia as well.  Patient was transferred to the ICU and BiPAP was started.  F/U ABG post BiPAP showed no evidence of improvement and mental status continued to deteriorate so decision was made to intubate.  STUDIES:  CXR 11/13- with cardiomegally and pulmonary edema. Venous doppler 11/13- without DVT, superficial thrombosis, or Baker's cyst CXR 11/14- Hypoventilation with bibasilar atelectasis.  TTE 11/14- Mod concentric LV hypertrophy, EF 55-60%, grade 2 diastolic dysfunction, can't assess PV.  CXR 11/15- Slight interval deterioration in the pulmonary interstitium with increased pulmonary vascular prominence. Developing left lower lobe atelectasis is suspected.   SIGNIFICANT EVENTS: 11/13 intubation for hypercarbic/hypoxic respiratory failure. 11/14- transitioned from Lovenox gtt to heparin gtt  SUBJECTIVE:   Intubated. RASS score -1.   VITAL SIGNS: Temp:  [99 F (37.2 C)-100.9 F (38.3 C)] 100.2 F (37.9 C) (11/15 0700) Pulse Rate:  [86-111] 102 (11/15 0748) Resp:  [0-39] 10 (11/15 0748) BP: (109-142)/(59-95) 118/66 mmHg (11/15 0700) SpO2:  [94 %-99 %] 95 % (11/15 0748) FiO2 (%):  [40 %-50 %] 40 % (11/15 0816) Weight:  [424 lb 6.2 oz (192.5 kg)] 424 lb 6.2 oz (192.5 kg) (11/15 0430) HEMODYNAMICS: PAP: (11-50)/(6-17) 14/11 mmHg CVP:  [3 mmHg-14 mmHg] 14 mmHg PCWP:  [14 mmHg] 14 mmHg CO:  [8.5 L/min] 8.5 L/min CI:  [2.9 L/min/m2] 2.9 L/min/m2 VENTILATOR SETTINGS: Vent Mode:  [-] PRVC FiO2 (%):  [40 %-50 %] 40  % Set Rate:  [10 bmp-14 bmp] 14 bmp Vt Set:  [580 mL] 580 mL PEEP:  [5 cmH20] 5 cmH20 Plateau Pressure:  [27 cmH20-29 cmH20] 27 cmH20 INTAKE / OUTPUT:  Intake/Output Summary (Last 24 hours) at 06/15/15 0833 Last data filed at 06/15/15 0800  Gross per 24 hour  Intake 2068.32 ml  Output   5550 ml  Net -3481.68 ml    PHYSICAL EXAMINATION: General:  Acute on chronically ill appearing male sleeping Neuro:  Difficult to arouse this AM.   HEENT:  [place]/AT, PERRL, Cardiovascular:  Distant heart sounds.  RRR, Nl S1/2, No M/R/G. Lungs:  Distant but clear breath sounds.  Abdomen:  Soft, obese, ND and +BS. Musculoskeletal:  2+ Bilateral LE edema.  Skin:  Intact.  LABS:  CBC  Recent Labs Lab 06/13/15 0505 06/14/15 0835 06/15/15 0527  WBC 12.2* 9.8 11.7*  HGB 13.1 12.1* 12.4*  HCT 42.9 39.5 42.0  PLT 232 193 202   Coag's No results for input(s): APTT, INR in the last 168 hours. BMET  Recent Labs Lab 06/13/15 0505 06/14/15 0835 06/15/15 0527  NA 134* 143 138  K 3.9 3.4* 3.9  CL 95* 98* 91*  CO2 33* 35* 37*  BUN 10 11 17   CREATININE 1.05 1.17 1.38*  GLUCOSE 226* 97 170*   Electrolytes  Recent Labs Lab 06/13/15 0505 06/14/15 0835 06/15/15 0527  CALCIUM 8.5* 8.8* 8.7*  MG  --  1.8  --   PHOS  --  2.2*  --    Sepsis Markers  Recent Labs Lab 06/12/15 1556 06/12/15 1913  LATICACIDVEN 1.58 1.00  ABG  Recent Labs Lab 06/13/15 1441 06/14/15 0321 06/15/15 0800  PHART 7.410 7.525* 7.283*  PCO2ART 52.4* 39.1 85.7*  PO2ART 73.8* 51.0* 80.3   Liver Enzymes  Recent Labs Lab 06/12/15 1539  AST 20  ALT 31  ALKPHOS 150*  BILITOT 1.0  ALBUMIN 3.8   Cardiac Enzymes  Recent Labs Lab 06/13/15 1550 06/13/15 2201 06/14/15 0051  TROPONINI <0.03 <0.03 <0.03   Glucose  Recent Labs Lab 06/14/15 1130 06/14/15 1521 06/14/15 1930 06/14/15 2356 06/15/15 0355 06/15/15 0742  GLUCAP 117* 129* 124* 147* 157* 148*    Imaging Dg Chest Port 1  View  06/15/2015  CLINICAL DATA:  Hypoxia, hypercapnia, respiratory failure with intubation, morbid obesity. EXAM: PORTABLE CHEST 1 VIEW COMPARISON:  Portable chest x-ray of June 14, 2015 FINDINGS: The lungs are mildly hypoinflated. The interstitial markings remain increased. The left hemidiaphragm is less well demonstrated today. The cardiac silhouette remains enlarged. The pulmonary vascularity is engorged and less distinct today. The endotracheal tube tip projects of approximately 1.5 cm above the carina. The esophagogastric tube tip projects below the inferior margin of the image. The subclavian catheter is again seen to extend into the upper portion of the IVC and loop upon itself and is not extend into the distal right atrium near the tricuspid valve. IMPRESSION: Slight interval deterioration in the pulmonary interstitium with increased pulmonary vascular prominence. Developing left lower lobe atelectasis is suspected. The endotracheal tube tip lies 1.5 cm above the carina. See the note above regarding positioning of the Swan-Ganz catheter. Electronically Signed   By: David  [place] M.D.   On: 06/15/2015 07:31     ASSESSMENT / PLAN:  PULMONARY OETT 11/13>>> A: VDRF, hypercarbic, due to morbid obesity, OSA, OHV and CHF. Slight concerns for PE (unable to obtain CTA due to body habitus) P:   - Full vent support. - VAP - given hypercarbic respiratory acidosis noted on ABG this AM, will increase RR to 14 - Repeat ABG in 1 hour.  - F/U CXR in the AM. - V/Q scan ordered but unable to obtain due to intubation - Heparin gtt - Diuresis as below   CARDIOVASCULAR R IJ PAC 11/13>>> A: CHF due to morbid obesity and likely pulmonary edema and pulmonary HTN. Troponins negative P:  - Lasix gtt - metolazone , consider continuation of this - Continue home cardiac medications for now - consider discontinuation of Swan-Ganz as it is not in the appropriate location.   RENAL A:  Hyponatremia,  hypokalemia and increased bicarb. Hypomagnesemia  Slight increase to 1.38 in SCr, most like from diuresis P:   - KVO IVF. - Replace electrolytes as indicated. - BMET in AM  GASTROINTESTINAL A:  Nutrition. P:   - Tube Feeds - Protonix daily  HEMATOLOGIC A:  No active issues. P:  - Heparin gtt as above - CBC in AM. - Transfuse per ICU protocol.  INFECTIOUS A:  No evidence of infection. P:   - Monitor WBC and fever curve.  ENDOCRINE A:  DM.   P:   - SSI - CBGs.  NEUROLOGIC A:  AMS due to hypercarbia, non-focal exam. P:   RASS goal: 0 Versed PRN Fentanyl PRN  FAMILY  - Updates: No family at the beside this AM.  Joanna Puff, MD [facility] Resident  06/15/2015, 8:33 AM

## 2015-06-15 NOTE — Clinical Documentation Improvement (Signed)
Critical Care, Critical Care PA's  Can the diagnosis of CHF be further specified? Please update your documentation within the medical record to reflect your response to this query. Do not document in BPA drop down box. Thank you!    Acuity - Acute, Chronic, Acute on Chronic   Type - Systolic, Diastolic, Systolic and Diastolic   Dysfunction does not equal Heart Failure per Coding Rules and Guidelines  Other  Clinically Undetermined  Document any associated diagnoses/conditions  Supporting Information:  ECHO reveals: EF 55-60% with normal systolic function, wall motion is normal; has Grade 2 diastolic dysfunction  Lasix Infusion running  Please exercise your independent, professional judgment when responding. A specific answer is not anticipated or expected.  Thank You,  Shellee MiloEileen T Gwen Sarvis RN, BSN Health Information Management [place] [phone]: Cell: [phone]

## 2015-06-15 NOTE — Progress Notes (Signed)
CRITICAL VALUE ALERT  Critical value received:  PCO2 85.7  Date of notification:  06/15/15  Time of notification:  0808  Critical value read back:Yes.    Nurse who received alert:  Layne BentonJulian Kameran Mcneese, RN   MD notified (1st page):  Rodrigo Ranrystal Dorsey, MD  Time of first page:  [phone]  MD notified (2nd page):  Time of second page:  Responding MD:  Rodrigo Ranrystal Dorsey  Time MD responded:  [phone]

## 2015-06-16 ENCOUNTER — Inpatient Hospital Stay (HOSPITAL_COMMUNITY): Payer: 59

## 2015-06-16 LAB — GLUCOSE, CAPILLARY
GLUCOSE-CAPILLARY: 215 mg/dL — AB (ref 65–99)
GLUCOSE-CAPILLARY: 228 mg/dL — AB (ref 65–99)
GLUCOSE-CAPILLARY: 210 mg/dL — AB (ref 65–99)
GLUCOSE-CAPILLARY: 230 mg/dL — AB (ref 65–99)
Glucose-Capillary: 163 mg/dL — ABNORMAL HIGH (ref 65–99)
Glucose-Capillary: 226 mg/dL — ABNORMAL HIGH (ref 65–99)
Glucose-Capillary: 245 mg/dL — ABNORMAL HIGH (ref 65–99)

## 2015-06-16 LAB — CBC
HCT: 41.9 % (ref 39.0–52.0)
HEMOGLOBIN: 12.7 g/dL — AB (ref 13.0–17.0)
MCH: 28.1 pg (ref 26.0–34.0)
MCHC: 30.3 g/dL (ref 30.0–36.0)
MCV: 92.7 fL (ref 78.0–100.0)
PLATELETS: 199 10*3/uL (ref 150–400)
RBC: 4.52 MIL/uL (ref 4.22–5.81)
RDW: 15 % (ref 11.5–15.5)
WBC: 15.2 10*3/uL — AB (ref 4.0–10.5)

## 2015-06-16 LAB — COMPREHENSIVE METABOLIC PANEL
ALK PHOS: 117 U/L (ref 38–126)
ALT: 20 U/L (ref 17–63)
AST: 22 U/L (ref 15–41)
Albumin: 3 g/dL — ABNORMAL LOW (ref 3.5–5.0)
Anion gap: 13 (ref 5–15)
BUN: 24 mg/dL — AB (ref 6–20)
CALCIUM: 9.3 mg/dL (ref 8.9–10.3)
CHLORIDE: 87 mmol/L — AB (ref 101–111)
CO2: 40 mmol/L — AB (ref 22–32)
CREATININE: 1.54 mg/dL — AB (ref 0.61–1.24)
GFR calc Af Amer: >60 mL/min (ref >60)
GFR calc non Af Amer: 58 mL/min — ABNORMAL LOW (ref >60)
Glucose, Bld: 235 mg/dL — ABNORMAL HIGH (ref 65–99)
Potassium: 3.3 mmol/L — ABNORMAL LOW (ref 3.5–5.1)
SODIUM: 140 mmol/L (ref 135–145)
Total Bilirubin: 1 mg/dL (ref 0.3–1.2)
Total Protein: 7.6 g/dL (ref 6.5–8.1)

## 2015-06-16 LAB — BLOOD GAS, ARTERIAL
ACID-BASE EXCESS: 16.3 mmol/L — AB (ref 0.0–2.0)
Bicarbonate: 41.9 mEq/L — ABNORMAL HIGH (ref 20.0–24.0)
DRAWN BY: 232811
FIO2: 0.4
MECHVT: 580 mL
O2 Saturation: 92.8 %
PEEP/CPAP: 5 cmH2O
Patient temperature: 99.1
RATE: 14 resp/min
TCO2: 44 mmol/L (ref 0–100)
pCO2 arterial: 68.3 mmHg (ref 35.0–45.0)
pH, Arterial: 7.406 (ref 7.350–7.450)
pO2, Arterial: 71.6 mmHg — ABNORMAL LOW (ref 80.0–100.0)

## 2015-06-16 MED ORDER — POTASSIUM CHLORIDE 20 MEQ/15ML (10%) PO SOLN
20.0000 meq | Freq: Once | ORAL | Status: AC
  Administered 2015-06-16: 20 meq

## 2015-06-16 MED ORDER — INSULIN ASPART 100 UNIT/ML ~~LOC~~ SOLN
0.0000 [IU] | Freq: Every day | SUBCUTANEOUS | Status: DC
  Administered 2015-06-16: 2 [IU] via SUBCUTANEOUS

## 2015-06-16 MED ORDER — INSULIN ASPART 100 UNIT/ML ~~LOC~~ SOLN
0.0000 [IU] | Freq: Three times a day (TID) | SUBCUTANEOUS | Status: DC
  Administered 2015-06-16 – 2015-06-17 (×3): 3 [IU] via SUBCUTANEOUS

## 2015-06-16 NOTE — Progress Notes (Signed)
Wasted 25 ml versed in sink with witness, Eddie NorthJennifer Clifton, RN.

## 2015-06-16 NOTE — Progress Notes (Signed)
CRITICAL VALUE ALERT  Critical value received:  PH 7.4 CO2 68.3 PO2 71.6 Bicarb 41.9 Sat 92  Date of notification:  06/16/15  Time of notification:  0415  Critical value read back: Yes  Nurse who received alert: Eddie NorthJennifer Ayumi Wangerin, RN  MD notified (1st page):  Dr. Nadine CountsGottschalk  Time of first page:  [phone]  MD notified (2nd page):  Time of second page:  Responding MD:  Dr. Nadine CountsGottschalk  Time MD responded:  [phone]

## 2015-06-16 NOTE — Progress Notes (Signed)
Inpatient Diabetes Program Recommendations  AACE/ADA: New Consensus Statement on Inpatient Glycemic Control (2015)  Target Ranges:  Prepandial:   less than 140 mg/dL      Peak postprandial:   less than 180 mg/dL (1-2 hours)      Critically ill patients:  140 - 180 mg/dL   Review of Glycemic Control  Diabetes history: DM 2 Outpatient Diabetes medications: Amaryl 4 mg Daily Current orders for Inpatient glycemic control: Novolog Sensitive + HS  Inpatient Diabetes Program Recommendations: Insulin - Tube Feed Coverage: While patient is on Vital HP going at 90/hr, glucose is elevated, please consider starting Novolog 3 units Q4hrs Tube Feed coverage.   Thanks, Christena DeemShannon Rylan Bernard RN, MSN, [facility] Inpatient Diabetes Coordinator Team Pager [phone] (8a-5p)

## 2015-06-16 NOTE — Progress Notes (Signed)
PULMONARY / CRITICAL CARE MEDICINE   Name: Derrick HansenReginald A [name] MRN: [id number] DOB: 01-19-1981    ADMISSION DATE:  06/12/2015 CONSULTATION DATE:  06/13/2015  REFERRING MD :  TRH  CHIEF COMPLAINT:  Hypercarbic respiratory failure and AMS  INITIAL PRESENTATION: 34 year old male with morbid obesity, OSA and OHV who presents to PCCM with AMS.  ABG was ordered and patient was noted to be hypercarbic and with respiratory acidosis.  Subsequently the patient developed hypoxemia as well.  Patient was transferred to the ICU and BiPAP was started.  F/U ABG post BiPAP showed no evidence of improvement and mental status continued to deteriorate so decision was made to intubate.  STUDIES:  CXR 11/13- with cardiomegally and pulmonary edema. Venous doppler 11/13- without DVT, superficial thrombosis, or Baker's cyst CXR 11/14- Hypoventilation with bibasilar atelectasis.  TTE 11/14- Mod concentric LV hypertrophy, EF 55-60%, grade 2 diastolic dysfunction, can't assess PV.  CXR 11/15- Slight interval deterioration in the pulmonary interstitium with increased pulmonary vascular prominence. Developing left lower lobe atelectasis is suspected.   SIGNIFICANT EVENTS: 11/13 intubation for hypercarbic/hypoxic respiratory failure. 11/14- transitioned from Lovenox gtt to heparin gtt 11/15- removed PA catheter and replaced with CVL.   SUBJECTIVE:   Intubated. RASS score -1. Continues to be on Lasix gtt at lower dose. On Fentanyl and Versed per PAD protocol. Failed weaning trial- alert but very poor effort with rapid desaturations  VITAL SIGNS: Temp:  [99.1 F (37.3 C)-100.4 F (38 C)] 99.6 F (37.6 C) (11/16 0742) Pulse Rate:  [93-105] 96 (11/16 0807) Resp:  [0-20] 14 (11/16 0807) BP: (100-131)/(63-84) 108/70 mmHg (11/16 0807) SpO2:  [92 %-97 %] 93 % (11/16 0807) FiO2 (%):  [40 %] 40 % (11/16 0807) HEMODYNAMICS:   VENTILATOR SETTINGS: Vent Mode:  [-] PRVC FiO2 (%):  [40 %] 40 % Set Rate:  [14 bmp] 14  bmp Vt Set:  [580 mL] 580 mL PEEP:  [5 cmH20] 5 cmH20 Plateau Pressure:  [25 cmH20-26 cmH20] 26 cmH20 INTAKE / OUTPUT:  Intake/Output Summary (Last 24 hours) at 06/16/15 0831 Last data filed at 06/16/15 0800  Gross per 24 hour  Intake 3709.88 ml  Output   6200 ml  Net -2490.12 ml    PHYSICAL EXAMINATION: General:  Obese male sedated, not waking up. Neuro:  Difficult to arouse. Not following commands    HEENT:  [place]/AT, PERRL, Cardiovascular:  Distant heart sounds.  RRR, Nl S1/2, No M/R/G. Lungs:  Distant with faint bilateral crackles.  Abdomen:  Soft, obese, ND and +BS. Musculoskeletal:  1+ Bilateral LE edema.  Skin:  Intact.  LABS:  CBC  Recent Labs Lab 06/14/15 0835 06/15/15 0527 06/16/15 0400  WBC 9.8 11.7* 15.2*  HGB 12.1* 12.4* 12.7*  HCT 39.5 42.0 41.9  PLT 193 202 199   Coag's No results for input(s): APTT, INR in the last 168 hours. BMET  Recent Labs Lab 06/14/15 0835 06/15/15 0527 06/16/15 0400  NA 143 138 140  K 3.4* 3.9 3.3*  CL 98* 91* 87*  CO2 35* 37* 40*  BUN 11 17 24*  CREATININE 1.17 1.38* 1.54*  GLUCOSE 97 170* 235*   Electrolytes  Recent Labs Lab 06/14/15 0835 06/15/15 0527 06/16/15 0400  CALCIUM 8.8* 8.7* 9.3  MG 1.8  --   --   PHOS 2.2*  --   --    Sepsis Markers  Recent Labs Lab 06/12/15 1556 06/12/15 1913  LATICACIDVEN 1.58 1.00   ABG  Recent Labs Lab 06/15/15 0800 06/15/15  0950 06/16/15 0358  PHART 7.283* 7.405 7.406  PCO2ART 85.7* 63.6* 68.3*  PO2ART 80.3 68.2* 71.6*   Liver Enzymes  Recent Labs Lab 06/12/15 1539 06/16/15 0400  AST 20 22  ALT 31 20  ALKPHOS 150* 117  BILITOT 1.0 1.0  ALBUMIN 3.8 3.0*   Cardiac Enzymes  Recent Labs Lab 06/13/15 1550 06/13/15 2201 06/14/15 0051  TROPONINI <0.03 <0.03 <0.03   Glucose  Recent Labs Lab 06/15/15 1157 06/15/15 1529 06/15/15 1938 06/15/15 2343 06/16/15 0338 06/16/15 0739  GLUCAP 169* 148* 164* 163* 245* 215*    Imaging Dg Chest Port 1  View  06/16/2015  CLINICAL DATA:  Acute respiratory failure, diabetes, hypertension, obesity. EXAM: PORTABLE CHEST 1 VIEW COMPARISON:  Portable chest x-ray of June 15, 2015 FINDINGS: The lungs remain mildly hypoinflated. The left hemidiaphragm remains partially obscured. The cardiac silhouette remains enlarged. The pulmonary vascularity is engorged but more distinct today. Pulmonary interstitial edema has decreased. The endotracheal tube tip lies 2.2 cm above the carina. The esophagogastric tube tip and proximal port lie below the GE junction. The right internal jugular venous catheter tip projects over the proximal SVC. IMPRESSION: Mild interval improvement in the pulmonary interstitium consistent with decreased edema. Persistent left lower lobe atelectasis and probable small left pleural effusion. Stable cardiomegaly. The support tubes are stable in position. Electronically Signed   By: David  [place] M.D.   On: 06/16/2015 07:36   Dg Chest Port 1 View  06/15/2015  CLINICAL DATA:  34 year old with ventilator dependent respiratory failure, acute on chronic diastolic heart failure, with placement of a new bedside right internal jugular catheter. EXAM: PORTABLE CHEST 1 VIEW 1503 hr: COMPARISON:  Portable chest x-ray earlier same day 0316 hr and previously. FINDINGS: New right jugular dual-lumen central venous catheter tip projects over the mid SVC. No evidence of pneumothorax or mediastinal hematoma. Endotracheal tube tip low, approximately 2 cm above the carina. Nasogastric tube courses below the diaphragm into the stomach though its tip is not included. Stable marked cardiomegaly. Pulmonary venous hypertension which has improved since earlier in the day. Focal airspace consolidation in the right upper lobe, right lower lobe, inferior left upper lobe and dense airspace consolidation in the left lower lobe, progressive since earlier in the day. IMPRESSION: 1. New right jugular central venous catheter tip  projects over the mid SVC. No acute complicating features. 2. Endotracheal tube tip somewhat low, approximately 2 cm above carina. 3. Mild pulmonary venous hypertension, improved since earlier in the day. 4. Worsening atelectasis and/or pneumonia throughout both lungs since earlier in the day. Electronically Signed   By: Hulan Saas M.D.   On: 06/15/2015 15:21     ASSESSMENT / PLAN:  PULMONARY OETT 11/13>>> A: VDRF, hypercarbic, due to morbid obesity, OSA, OHV and CHF. PE less likely given history P:   - Full vent support. - VAP - unsuccessful wean this AM  - F/U CXR in the AM. - D/c'd Heparin gtt - Diuresis as below  - May eventually need a trach   CARDIOVASCULAR R IJ PAC 11/13>>>11/5, replaced on 11/15 with CVL A: CHF exacerbation due to morbid obesity and likely pulmonary edema and pulmonary HTN. Troponins negative Net -7.2L over hospitalization, -3.4L overnight P:  - Lasix gtt at 4 - Continue home cardiac medications for now  RENAL A:  Hyponatremia, hypokalemia and increased bicarb. Hypomagnesemia  Slight increase to 1.5 in SCr, most like from diuresis P:   - KVO IVF. - Replace electrolytes as indicated. - BMET  in AM  GASTROINTESTINAL A:  Nutrition. P:   - Tube Feeds - Protonix daily  HEMATOLOGIC A:  No active issues. DVT ppx P:  - SQ heparin - CBC in AM. - Transfuse per ICU protocol.  INFECTIOUS A:  No evidence of infection. P:   - Monitor WBC and fever curve.  ENDOCRINE A:  DM.   A1c this hospitalization was 10.1 P:   - On SSI - CBGs.  NEUROLOGIC A:  AMS due to hypercarbia P:   RASS goal: 0 Versed PRN Fentanyl PRN  FAMILY  - Updates: No family at the beside this AM.  Joanna Puff, MD [facility] Family Medicine Resident  06/16/2015, 8:31 AM

## 2015-06-17 ENCOUNTER — Inpatient Hospital Stay (HOSPITAL_COMMUNITY): Payer: 59

## 2015-06-17 LAB — BASIC METABOLIC PANEL
ANION GAP: 7 (ref 5–15)
BUN: 30 mg/dL — AB (ref 6–20)
CALCIUM: 9.2 mg/dL (ref 8.9–10.3)
CO2: 46 mmol/L — ABNORMAL HIGH (ref 22–32)
Chloride: 89 mmol/L — ABNORMAL LOW (ref 101–111)
Creatinine, Ser: 1.27 mg/dL — ABNORMAL HIGH (ref 0.61–1.24)
GFR calc Af Amer: >60 mL/min (ref >60)
GLUCOSE: 235 mg/dL — AB (ref 65–99)
POTASSIUM: 3.5 mmol/L (ref 3.5–5.1)
SODIUM: 142 mmol/L (ref 135–145)

## 2015-06-17 LAB — CBC
HEMATOCRIT: 40.5 % (ref 39.0–52.0)
Hemoglobin: 12.3 g/dL — ABNORMAL LOW (ref 13.0–17.0)
MCH: 28.3 pg (ref 26.0–34.0)
MCHC: 30.4 g/dL (ref 30.0–36.0)
MCV: 93.3 fL (ref 78.0–100.0)
Platelets: 234 10*3/uL (ref 150–400)
RBC: 4.34 MIL/uL (ref 4.22–5.81)
RDW: 14.8 % (ref 11.5–15.5)
WBC: 12.8 10*3/uL — AB (ref 4.0–10.5)

## 2015-06-17 LAB — GLUCOSE, CAPILLARY
GLUCOSE-CAPILLARY: 262 mg/dL — AB (ref 65–99)
Glucose-Capillary: 218 mg/dL — ABNORMAL HIGH (ref 65–99)
Glucose-Capillary: 201 mg/dL — ABNORMAL HIGH (ref 65–99)
Glucose-Capillary: 232 mg/dL — ABNORMAL HIGH (ref 65–99)

## 2015-06-17 MED ORDER — FUROSEMIDE 10 MG/ML IJ SOLN
60.0000 mg | Freq: Three times a day (TID) | INTRAMUSCULAR | Status: AC
  Administered 2015-06-17 – 2015-06-18 (×3): 60 mg via INTRAVENOUS
  Filled 2015-06-17 (×3): qty 6

## 2015-06-17 MED ORDER — INSULIN ASPART 100 UNIT/ML ~~LOC~~ SOLN
3.0000 [IU] | Freq: Four times a day (QID) | SUBCUTANEOUS | Status: DC
  Administered 2015-06-17 – 2015-06-18 (×5): 3 [IU] via SUBCUTANEOUS

## 2015-06-17 MED ORDER — INSULIN ASPART 100 UNIT/ML ~~LOC~~ SOLN
0.0000 [IU] | SUBCUTANEOUS | Status: DC
  Administered 2015-06-17: 5 [IU] via SUBCUTANEOUS
  Administered 2015-06-17: 8 [IU] via SUBCUTANEOUS
  Administered 2015-06-17 – 2015-06-18 (×2): 5 [IU] via SUBCUTANEOUS
  Administered 2015-06-18: 8 [IU] via SUBCUTANEOUS
  Administered 2015-06-18: 11 [IU] via SUBCUTANEOUS
  Administered 2015-06-18 (×2): 8 [IU] via SUBCUTANEOUS
  Administered 2015-06-18 – 2015-06-19 (×3): 5 [IU] via SUBCUTANEOUS

## 2015-06-17 MED ORDER — POTASSIUM CHLORIDE 20 MEQ/15ML (10%) PO SOLN
20.0000 meq | ORAL | Status: AC
  Administered 2015-06-17 (×2): 20 meq
  Filled 2015-06-17: qty 15

## 2015-06-17 NOTE — Progress Notes (Signed)
PULMONARY / CRITICAL CARE MEDICINE   Name: ARMSTRONG CREASY MRN: [id number] DOB: 1981-05-27    ADMISSION DATE:  06/12/2015 CONSULTATION DATE:  06/13/2015  REFERRING MD :  TRH  CHIEF COMPLAINT:  Hypercarbic respiratory failure and AMS  INITIAL PRESENTATION: 34 year old male with morbid obesity, OSA and OHV who presents to PCCM with AMS.  ABG was ordered and patient was noted to be hypercarbic and with respiratory acidosis.  Subsequently the patient developed hypoxemia as well.  Patient was transferred to the ICU and BiPAP was started.  F/U ABG post BiPAP showed no evidence of improvement and mental status continued to deteriorate so decision was made to intubate.  STUDIES:  CXR 11/13- with cardiomegally and pulmonary edema. Venous doppler 11/13- without DVT, superficial thrombosis, or Baker's cyst CXR 11/14- Hypoventilation with bibasilar atelectasis.  TTE 11/14- Mod concentric LV hypertrophy, EF 55-60%, grade 2 diastolic dysfunction, can't assess PV.  CXR 11/15- Slight interval deterioration in the pulmonary interstitium with increased pulmonary vascular prominence. Developing left lower lobe atelectasis is suspected.  CXR 11/17: Worsening pulmonary edema.   SIGNIFICANT EVENTS: 11/13 intubation for hypercarbic/hypoxic respiratory failure. 11/14- transitioned from Lovenox gtt to heparin gtt 11/15- removed PA catheter and replaced with CVL, decrease Lasix gtt    SUBJECTIVE:   Intubated. RASS score -1. Continues to be on Lasix gtt 4. On Fentanyl per PAD protocol. Continues to have BMs.   VITAL SIGNS: Temp:  [100 F (37.8 C)-100.9 F (38.3 C)] 100 F (37.8 C) (11/17 0359) Pulse Rate:  [95-110] 104 (11/17 0737) Resp:  [0-23] 14 (11/17 0737) BP: (95-135)/(49-89) 118/59 mmHg (11/17 0737) SpO2:  [93 %-97 %] 96 % (11/17 0737) FiO2 (%):  [40 %] 40 % (11/17 0737) Weight:  [441 lb (200.036 kg)] 441 lb (200.036 kg) (11/17 0356) HEMODYNAMICS:   VENTILATOR SETTINGS: Vent Mode:  [-]  PRVC FiO2 (%):  [40 %] 40 % Set Rate:  [14 bmp] 14 bmp Vt Set:  [580 mL] 580 mL PEEP:  [5 cmH20] 5 cmH20 Plateau Pressure:  [25 cmH20-27 cmH20] 27 cmH20 INTAKE / OUTPUT:  Intake/Output Summary (Last 24 hours) at 06/17/15 0742 Last data filed at 06/17/15 0600  Gross per 24 hour  Intake 3078.53 ml  Output   2470 ml  Net 608.53 ml    PHYSICAL EXAMINATION: General:  Obese male intermittently opening eyes.  Neuro:  Does not follow commands  Cardiovascular:  Distant heart sounds.  No m/r/g noted. Lungs:  Distant with faint bilateral crackles.  Abdomen:  Soft, obese, ND and +BS. Musculoskeletal:  1+ Bilateral LE edema.  Skin:  Intact.   LABS:  CBC  Recent Labs Lab 06/15/15 0527 06/16/15 0400 06/17/15 0430  WBC 11.7* 15.2* 12.8*  HGB 12.4* 12.7* 12.3*  HCT 42.0 41.9 40.5  PLT 202 199 234   Coag's No results for input(s): APTT, INR in the last 168 hours. BMET  Recent Labs Lab 06/15/15 0527 06/16/15 0400 06/17/15 0430  NA 138 140 142  K 3.9 3.3* 3.5  CL 91* 87* 89*  CO2 37* 40* 46*  BUN 17 24* 30*  CREATININE 1.38* 1.54* 1.27*  GLUCOSE 170* 235* 235*   Electrolytes  Recent Labs Lab 06/14/15 0835 06/15/15 0527 06/16/15 0400 06/17/15 0430  CALCIUM 8.8* 8.7* 9.3 9.2  MG 1.8  --   --   --   PHOS 2.2*  --   --   --    Sepsis Markers  Recent Labs Lab 06/12/15 1556 06/12/15 1913  LATICACIDVEN  1.58 1.00   ABG  Recent Labs Lab 06/15/15 0800 06/15/15 0950 06/16/15 0358  PHART 7.283* 7.405 7.406  PCO2ART 85.7* 63.6* 68.3*  PO2ART 80.3 68.2* 71.6*   Liver Enzymes  Recent Labs Lab 06/12/15 1539 06/16/15 0400  AST 20 22  ALT 31 20  ALKPHOS 150* 117  BILITOT 1.0 1.0  ALBUMIN 3.8 3.0*   Cardiac Enzymes  Recent Labs Lab 06/13/15 1550 06/13/15 2201 06/14/15 0051  TROPONINI <0.03 <0.03 <0.03   Glucose  Recent Labs Lab 06/16/15 0338 06/16/15 0739 06/16/15 1129 06/16/15 1532 06/16/15 1904 06/16/15 2228  GLUCAP 245* 215* 228* 210*  230* 226*    Imaging Dg Chest Port 1 View  06/17/2015  CLINICAL DATA:  Acute respiratory failure, shortness of breath. EXAM: PORTABLE CHEST 1 VIEW COMPARISON:  Portable chest x-ray of June 16, 2015 FINDINGS: The lungs remain mildly hypoinflated. The interstitial and alveolar opacities persist bilaterally. The cardiac silhouette remains enlarged. The pulmonary vascularity engorged and less distinct today. No significant pleural effusion is observed. There is no pneumothorax. The endotracheal tube tip lies 2.5 cm above the carina. The esophagogastric tube tip projects below the inferior margin of the image. The right internal jugular venous catheter tip projects over the proximal SVC. IMPRESSION: Mild interval deterioration in the appearance of the pulmonary interstitium and pulmonary vascularity consistent with worsening of pulmonary edema. Left lower lobe atelectasis or infiltrate persists. No significant pleural effusion is observed. The support tubes are in reasonable position. Electronically Signed   By: David  [place] M.D.   On: 06/17/2015 07:12     ASSESSMENT / PLAN:  PULMONARY OETT 11/13>>> A: VDRF, hypercarbic, due to morbid obesity, OSA, OHV and CHF. PE less likely given history P:   - Full vent support. - VAP - attempt to wean all sedation and wean this AM - F/U CXR in the AM. - Diuresis as below  - Will likely eventually need a trach   CARDIOVASCULAR R IJ PAC 11/13>>>11/5, replaced on 11/15 with CVL A: CHF exacerbation due to morbid obesity and likely pulmonary edema and pulmonary HTN. Troponins negative P:  - goal net negative: patient up 600mL in the last 24hrs - Lasix gtt at 4, will transition to Lasix 60mg  IV q8hr x 3 doses  - Holding home lisinopril-HCTZ for now   RENAL A:  Hyponatremia, hypokalemia and increased bicarb. Hypomagnesemia  P:   - KVO IVF. - Replace electrolytes as indicated. - BMET and Mag level in AM  GASTROINTESTINAL A:  Nutrition. P:   -  Tube Feeds - Protonix daily  HEMATOLOGIC A:  No active issues. DVT ppx P:  - SQ heparin - CBC in AM. - Transfuse per ICU protocol.  INFECTIOUS A:  No evidence of infection. P:   - Monitor WBC and fever curve.  ENDOCRINE A:  DM.   A1c this hospitalization was 10.1 P:   - On SSI - Given continued hyperglycemia on tube feeds, will add Novolog 3 units q6hrs while on tube feeds - CBGs.  NEUROLOGIC A:  AMS due to hypercarbia P:   RASS goal: 0 Fentanyl PRN WUA   FAMILY  - Updates: No family at the beside this AM, last updated 11/16.   Joanna Puffrystal S. Nailyn Dearinger, MD [facility] Resident  06/17/2015, 7:42 AM

## 2015-06-17 NOTE — Progress Notes (Signed)
INTERVAL PROGRESS NOTE  Patient stable, not improving. Still unable to be weaned. Switched from sensitive SSI to moderate. Also added Novolog 3 units q6hrs. If not improved, could consider transitioning to 3 units q4hs. Continue with intermittent diuresis.   Joanna Puffrystal S. Dorsey, MD [facility] Resident  06/17/2015, 2:32 PM

## 2015-06-17 NOTE — Progress Notes (Signed)
[facility] ICU Electrolyte Replacement Protocol  Patient Name: Derrick [name] DOB: Jul 18, 1981 MRN: [id number]  Date of Service  06/17/2015   HPI/Events of Note    Recent Labs Lab 06/13/15 0505 06/14/15 0835 06/15/15 0527 06/16/15 0400 06/17/15 0430  NA 134* 143 138 140 142  K 3.9 3.4* 3.9 3.3* 3.5  CL 95* 98* 91* 87* 89*  CO2 33* 35* 37* 40* 46*  GLUCOSE 226* 97 170* 235* 235*  BUN _0 24* 30*  CREATININE 1.05 1.17 1.38* 1.54* 1.27*  CALCIUM 8.5* 8.8* 8.7* 9.3 9.2  MG  --  1.8  --   --   --   PHOS  --  2.2*  --   --   --     Estimated Creatinine Clearance: 144.9 mL/min (by C-G formula based on Cr of 1.27).  Intake/Output      11/16 0701 - 11/17 0700   I.V. (mL/kg) 954.5 (4.8)   NG/GT 1980   Total Intake(mL/kg) 2934.5 (14.7)   Urine (mL/kg/hr) 2270 (0.5)   Total Output 2270   Net +664.5        - I/O DETAILED x24h    Total I/O In: 1404.2 [I.V.:504.2; NG/GT:900] Out: 820 [Urine:820] - I/O THIS SHIFT    ASSESSMENT   eICURN Interventions  ICU Electrolyte Replacement Protocol criteria met. Labs Replaced per protocol. MD notified   ASSESSMENT: MAJOR ELECTROLYTE    [name] 06/17/2015, 5:33 AM

## 2015-06-17 NOTE — Progress Notes (Signed)
Inpatient Diabetes Program Recommendations  AACE/ADA: New Consensus Statement on Inpatient Glycemic Control (2015)  Target Ranges:  Prepandial:   less than 140 mg/dL      Peak postprandial:   less than 180 mg/dL (1-2 hours)      Critically ill patients:  140 - 180 mg/dL   Review of Glycemic Control  Inpatient Diabetes Program Recommendations:  Insulin - Basal: add Lantus or Levemir 20 units  Insulin - Meal Coverage: Change Novolog 3 units to Q4 instead of 4 times a day Thank you  Piedad ClimesGina Quantez Schnyder BSN, RN,CDE Inpatient Diabetes Coordinator [phone] (team pager)

## 2015-06-18 ENCOUNTER — Inpatient Hospital Stay (HOSPITAL_COMMUNITY): Payer: 59

## 2015-06-18 DIAGNOSIS — J9600 Acute respiratory failure, unspecified whether with hypoxia or hypercapnia: Secondary | ICD-10-CM | POA: Diagnosis present

## 2015-06-18 LAB — BASIC METABOLIC PANEL
Anion gap: 6 (ref 5–15)
BUN: 38 mg/dL — AB (ref 6–20)
CHLORIDE: 90 mmol/L — AB (ref 101–111)
CO2: 47 mmol/L — ABNORMAL HIGH (ref 22–32)
Calcium: 9.4 mg/dL (ref 8.9–10.3)
Creatinine, Ser: 1.23 mg/dL (ref 0.61–1.24)
GFR calc Af Amer: >60 mL/min (ref >60)
GFR calc non Af Amer: >60 mL/min (ref >60)
GLUCOSE: 281 mg/dL — AB (ref 65–99)
POTASSIUM: 3.4 mmol/L — AB (ref 3.5–5.1)
Sodium: 143 mmol/L (ref 135–145)

## 2015-06-18 LAB — GLUCOSE, CAPILLARY
GLUCOSE-CAPILLARY: 220 mg/dL — AB (ref 65–99)
GLUCOSE-CAPILLARY: 235 mg/dL — AB (ref 65–99)
GLUCOSE-CAPILLARY: 313 mg/dL — AB (ref 65–99)
Glucose-Capillary: 212 mg/dL — ABNORMAL HIGH (ref 65–99)
Glucose-Capillary: 262 mg/dL — ABNORMAL HIGH (ref 65–99)
Glucose-Capillary: 272 mg/dL — ABNORMAL HIGH (ref 65–99)
Glucose-Capillary: 281 mg/dL — ABNORMAL HIGH (ref 65–99)

## 2015-06-18 LAB — CBC
HCT: 39.8 % (ref 39.0–52.0)
HEMOGLOBIN: 11.6 g/dL — AB (ref 13.0–17.0)
MCH: 27.7 pg (ref 26.0–34.0)
MCHC: 29.1 g/dL — ABNORMAL LOW (ref 30.0–36.0)
MCV: 95 fL (ref 78.0–100.0)
Platelets: 206 10*3/uL (ref 150–400)
RBC: 4.19 MIL/uL — AB (ref 4.22–5.81)
RDW: 14.8 % (ref 11.5–15.5)
WBC: 10.9 10*3/uL — ABNORMAL HIGH (ref 4.0–10.5)

## 2015-06-18 LAB — MAGNESIUM: Magnesium: 2.1 mg/dL (ref 1.7–2.4)

## 2015-06-18 MED ORDER — INSULIN GLARGINE 100 UNIT/ML ~~LOC~~ SOLN
10.0000 [IU] | Freq: Every day | SUBCUTANEOUS | Status: DC
  Administered 2015-06-18 – 2015-06-19 (×2): 10 [IU] via SUBCUTANEOUS
  Filled 2015-06-18 (×3): qty 0.1

## 2015-06-18 MED ORDER — FUROSEMIDE 10 MG/ML IJ SOLN
60.0000 mg | Freq: Three times a day (TID) | INTRAMUSCULAR | Status: AC
  Administered 2015-06-18 – 2015-06-19 (×3): 60 mg via INTRAVENOUS
  Filled 2015-06-18 (×3): qty 6

## 2015-06-18 MED ORDER — INSULIN ASPART 100 UNIT/ML ~~LOC~~ SOLN
4.0000 [IU] | SUBCUTANEOUS | Status: DC
  Administered 2015-06-18 – 2015-06-20 (×14): 4 [IU] via SUBCUTANEOUS

## 2015-06-18 MED ORDER — POTASSIUM CHLORIDE 20 MEQ/15ML (10%) PO SOLN
20.0000 meq | ORAL | Status: AC
  Administered 2015-06-18 (×2): 20 meq
  Filled 2015-06-18: qty 15

## 2015-06-18 NOTE — Progress Notes (Signed)
[facility] ICU Electrolyte Replacement Protocol  Patient Name: Derrick [name] DOB: 1981/07/17 MRN: [id number]  Date of Service  06/18/2015   HPI/Events of Note    Recent Labs Lab 06/14/15 0835 06/15/15 0527 06/16/15 0400 06/17/15 0430 06/18/15 0450  NA 143 138 140 142 143  K 3.4* 3.9 3.3* 3.5 3.4*  CL 98* 91* 87* 89* 90*  CO2 35* 37* 40* 46* 47*  GLUCOSE 97 170* 235* 235* 281*  BUN 11 17 24* 30* 38*  CREATININE 1.17 1.38* 1.54* 1.27* 1.23  CALCIUM 8.8* 8.7* 9.3 9.2 9.4  MG 1.8  --   --   --  2.1  PHOS 2.2*  --   --   --   --     Estimated Creatinine Clearance: 148.1 mL/min (by C-G formula based on Cr of 1.23).  Intake/Output      11/17 0701 - 11/18 0700   I.V. (mL/kg) 675.3 (3.4)   Other 50   NG/GT 1920   Total Intake(mL/kg) 2645.3 (13.4)   Urine (mL/kg/hr) 2880 (0.6)   Total Output 2880   Net -234.7        - I/O DETAILED x24h    Total I/O In: 1328.3 [I.V.:398.3; NG/GT:930] Out: 955 [Urine:955] - I/O THIS SHIFT    ASSESSMENT   eICURN Interventions  ICU Electrolyte Replacement Protocol criteria met. Labs Replaced per protocol. MD notified   ASSESSMENT: MAJOR ELECTROLYTE    Derrick [name] 06/18/2015, 5:45 AM

## 2015-06-18 NOTE — Care Management Note (Signed)
Case Management Note  Patient Details  Name: Derrick [name] MRN: [id number] Date of Birth: 04/01/1981  Subjective/Objective:    No insurance listed, however states has insurance - card at home - depending on insurance may by Ltach candidate post trach for weaning.  Discussed with wife to bring insurance cards in.                 Action/Plan:   Expected Discharge Date:  06/24/15               Expected Discharge Plan:  Long Term Acute Care (LTAC)  In-House Referral:     Discharge planning Services  CM Consult  Post Acute Care Choice:    Choice offered to:     DME Arranged:    DME Agency:     HH Arranged:    HH Agency:     Status of Service:  In process, will continue to follow  Medicare Important Message Given:    Date Medicare IM Given:    Medicare IM give by:    Date Additional Medicare IM Given:    Additional Medicare Important Message give by:     If discussed at Long Length of Stay Meetings, dates discussed:    Additional Comments:  [name], [name], RN 06/18/2015, 8:20 AM

## 2015-06-18 NOTE — Progress Notes (Signed)
PULMONARY / CRITICAL CARE MEDICINE   Name: Derrick [name] MRN: [id number] DOB: 01-05-1981    ADMISSION DATE:  06/12/2015 CONSULTATION DATE:  06/13/2015  REFERRING MD :  TRH  CHIEF COMPLAINT:  Hypercarbic respiratory failure and AMS  INITIAL PRESENTATION: 34 year old male with morbid obesity, OSA and OHV who presents to PCCM with AMS.  ABG was ordered and patient was noted to be hypercarbic and with respiratory acidosis.  Subsequently the patient developed hypoxemia as well.  Patient was transferred to the ICU and BiPAP was started.  F/U ABG post BiPAP showed no evidence of improvement and mental status continued to deteriorate so decision was made to intubate.  STUDIES:  CXR 11/13- with cardiomegally and pulmonary edema. Venous doppler 11/13- without DVT, superficial thrombosis, or Baker's cyst CXR 11/14- Hypoventilation with bibasilar atelectasis.  TTE 11/14- Mod concentric LV hypertrophy, EF 55-60%, grade 2 diastolic dysfunction, can't assess PV.  CXR 11/15- Slight interval deterioration in the pulmonary interstitium with increased pulmonary vascular prominence. Developing left lower lobe atelectasis is suspected.  CXR 11/17: Worsening pulmonary edema.  CXR 11/18: Slight improvement in pulmonary vascular congestion. Persistent bibasilar atelectasis or pneumonia  SIGNIFICANT EVENTS: 11/13 intubation for hypercarbic/hypoxic respiratory failure. 11/14- transitioned from Lovenox gtt to heparin gtt 11/15- removed PA catheter and replaced with CVL, decrease Lasix gtt  11/17- transitioned to intermittent Lasix   SUBJECTIVE:   Intubated. RASS score 0, on Fentanyl gtt.  Transitioned to intermittent Lasix yesterday. Patient asking questions about possible tracheostomy.     VITAL SIGNS: Temp:  [97.3 F (36.3 C)-100.3 F (37.9 C)] 100.3 F (37.9 C) (11/18 0810) Pulse Rate:  [62-106] 97 (11/18 0800) Resp:  [14-25] 14 (11/18 0800) BP: (92-153)/(46-91) 120/76 mmHg (11/18  0800) SpO2:  [91 %-100 %] 94 % (11/18 0800) FiO2 (%):  [40 %] 40 % (11/18 0735) Weight:  [434 lb 4.9 oz (197 kg)] 434 lb 4.9 oz (197 kg) (11/18 0449) HEMODYNAMICS:   VENTILATOR SETTINGS: Vent Mode:  [-] PRVC FiO2 (%):  [40 %] 40 % Set Rate:  [14 bmp] 14 bmp Vt Set:  [580 mL] 580 mL PEEP:  [5 cmH20] 5 cmH20 Plateau Pressure:  [24 cmH20-28 cmH20] 25 cmH20 INTAKE / OUTPUT:  Intake/Output Summary (Last 24 hours) at 06/18/15 0846 Last data filed at 06/18/15 0800  Gross per 24 hour  Intake 2756.33 ml  Output   3130 ml  Net -373.67 ml    PHYSICAL EXAMINATION: General:  Obese male alert. Answering questions and writing.  Neuro:  Following commands. Moves all extremities.  Cardiovascular:  Distant heart sounds.  No m/r/g noted.  Lungs:  Distant but CTAB.  Abdomen:  Soft, obese, ND and +BS. Musculoskeletal:  Trace Bilateral LE edema.  Skin:  Intact, lichenification over the LE bilaterally  LABS:  CBC  Recent Labs Lab 06/16/15 0400 06/17/15 0430 06/18/15 0450  WBC 15.2* 12.8* 10.9*  HGB 12.7* 12.3* 11.6*  HCT 41.9 40.5 39.8  PLT 199 234 206   Coag's No results for input(s): APTT, INR in the last 168 hours. BMET  Recent Labs Lab 06/16/15 0400 06/17/15 0430 06/18/15 0450  NA 140 142 143  K 3.3* 3.5 3.4*  CL 87* 89* 90*  CO2 40* 46* 47*  BUN 24* 30* 38*  CREATININE 1.54* 1.27* 1.23  GLUCOSE 235* 235* 281*   Electrolytes  Recent Labs Lab 06/14/15 0835  06/16/15 0400 06/17/15 0430 06/18/15 0450  CALCIUM 8.8*  < > 9.3 9.2 9.4  MG 1.8  --   --   --  2.1  PHOS 2.2*  --   --   --   --   < > = values in this interval not displayed. Sepsis Markers  Recent Labs Lab 06/12/15 1556 06/12/15 1913  LATICACIDVEN 1.58 1.00   ABG  Recent Labs Lab 06/15/15 0800 06/15/15 0950 06/16/15 0358  PHART 7.283* 7.405 7.406  PCO2ART 85.7* 63.6* 68.3*  PO2ART 80.3 68.2* 71.6*   Liver Enzymes  Recent Labs Lab 06/12/15 1539 06/16/15 0400  AST 20 22  ALT 31 20   ALKPHOS 150* 117  BILITOT 1.0 1.0  ALBUMIN 3.8 3.0*   Cardiac Enzymes  Recent Labs Lab 06/13/15 1550 06/13/15 2201 06/14/15 0051  TROPONINI <0.03 <0.03 <0.03   Glucose  Recent Labs Lab 06/17/15 1124 06/17/15 1522 06/17/15 1948 06/18/15 0006 06/18/15 0327 06/18/15 0735  GLUCAP 262* 201* 232* 262* 212* 272*    Imaging Dg Chest Port 1 View  06/18/2015  CLINICAL DATA:  Acute respiratory failure, CHF, diabetes, morbid obesity EXAM: PORTABLE CHEST 1 VIEW COMPARISON:  Portable chest x-ray of June 17, 2015 FINDINGS: The lungs are mildly hypoinflated. Persistent bibasal with basilar interstitial and alveolar opacities. No definite pleural effusion. The cardiac silhouette remains enlarged. The pulmonary vascularity remains engorged but is more distinct today. The endotracheal tube tip lies approximately 1.7 cm above the carina. The esophagogastric tube tip projects below the inferior margin of the image. The right internal jugular venous catheter tip projects over the proximal SVC. IMPRESSION: 1. The endotracheal tube tip has migrated further distally and now is 1.6 cm above the carina. Withdrawal by 2-3 cm is recommended. 2. Slight improvement in pulmonary vascular congestion. Persistent bibasilar atelectasis or pneumonia. No definite pleural effusion. Electronically Signed   By: David  Swaziland[place] M.D.   On: 06/18/2015 07:17     ASSESSMENT / PLAN:  PULMONARY OETT 11/13>>> A: VDRF, hypercarbic, due to morbid obesity, OSA, OHV and CHF. PE less likely given history P:   - Full vent support. - VAP - Diuresis as below  - Will likely need a trach early next week  CARDIOVASCULAR R IJ PAC 11/13>>>11/5, replaced on 11/15 with CVL A: CHF exacerbation due to morbid obesity and likely pulmonary edema and pulmonary HTN. Troponins negative P:  - goal net negative: patient down 500mL in the last 24hrs - Continue Lasix 60mg  IV q8hr x 3 doses  - Holding home lisinopril-HCTZ for now    RENAL A:  Hyponatremia, hypokalemia and increased bicarb. Hypomagnesemia  P:   - KVO IVF. - Replace ectrolytes as indicated. - BMET  in AM  GASTROINTESTINAL A:  Nutrition. P:   - Tube Feeds - Protonix daily  HEMATOLOGIC A:  No active issues. DVT ppx P:  - SQ heparin - CBC in AM. - Transfuse per ICU protocol.  INFECTIOUS A:  No evidence of infection. P:   - Monitor WBC and fever curve.  ENDOCRINE A:  DM.   A1c this hospitalization was 10.1 P:   - On Moderate SSI - Will increase Novolog to 4 units q4hrs while on tube feeds - CBGs.  NEUROLOGIC A:  AMS due to hypercarbia P:   RASS goal: 0 Fentanyl PRN WUA   FAMILY  - Updates: Family updated 11/17  Joanna Puffrystal S. Dorsey, MD [facility] Resident  06/18/2015, 8:46 AM

## 2015-06-18 NOTE — Progress Notes (Deleted)
[facility] Electrolyte Replacement Protocol  Patient Name: Derrick [name] DOB: 02/05/1981 MRN: [id number]  Date of Service  06/18/2015   HPI/Events of Note    Recent Labs Lab 06/14/15 0835 06/15/15 0527 06/16/15 0400 06/17/15 0430 06/18/15 0450  NA 143 138 140 142 143  K 3.4* 3.9 3.3* 3.5 3.4*  CL 98* 91* 87* 89* 90*  CO2 35* 37* 40* 46* 47*  GLUCOSE 97 170* 235* 235* 281*  BUN 11 17 24* 30* 38*  CREATININE 1.17 1.38* 1.54* 1.27* 1.23  CALCIUM 8.8* 8.7* 9.3 9.2 9.4  MG 1.8  --   --   --  2.1  PHOS 2.2*  --   --   --   --     Estimated Creatinine Clearance: 148.1 mL/min (by C-G formula based on Cr of 1.23).  Intake/Output      11/17 0701 - 11/18 0700   I.V. (mL/kg) 675.3 (3.4)   Other 50   NG/GT 1920   Total Intake(mL/kg) 2645.3 (13.4)   Urine (mL/kg/hr) 2880 (0.6)   Total Output 2880   Net -234.7        - I/O DETAILED x24h    Total I/O In: 1328.3 [I.V.:398.3; NG/GT:930] Out: 955 [Urine:955] - I/O THIS SHIFT    ASSESSMENT   eICURN Interventions     ASSESSMENT: MAJOR ELECTROLYTE    Derrick [name], Derrick [name] 06/18/2015, 5:44 AM

## 2015-06-18 NOTE — Care Management Note (Signed)
Case Management Note  Patient Details  Name: Derrick HansenReginald A Pollino MRN: [id number] Date of Birth: 14-Oct-1980  Subjective/Objective:    No insurance listed, however states has insurance - card at home - depending on insurance may by Ltach candidate post trach for weaning.  Discussed with wife to bring insurance cards in.  [name], [name] [name] 06-17-15  06-18-15 Again talked with wife, she did visit yesterday, does not know where cards are, talked with Mom and patient in room.  State it is a [employer] policy but don't know where the information is.  Will try to locate over the weekend.  Discussed why we are needing it, to see if insurance does have an Ltach benefit.  Mom tearful, but understands that if he gets trached and has the benefit this would be the place to go for hopeful vent liberation.                 Action/Plan:   Expected Discharge Date:  06/24/15               Expected Discharge Plan:  Long Term Acute Care (LTAC)  In-House Referral:     Discharge planning Services  CM Consult  Post Acute Care Choice:    Choice offered to:     DME Arranged:    DME Agency:     HH Arranged:    HH Agency:     Status of Service:  In process, will continue to follow  Medicare Important Message Given:    Date Medicare IM Given:    Medicare IM give by:    Date Additional Medicare IM Given:    Additional Medicare Important Message give by:     If discussed at Long Length of Stay Meetings, dates discussed:    Additional Comments:  [name], Shondell Poulson [name], RN 06/18/2015, 1:55 PM

## 2015-06-19 ENCOUNTER — Inpatient Hospital Stay (HOSPITAL_COMMUNITY): Payer: 59

## 2015-06-19 LAB — GLUCOSE, CAPILLARY
GLUCOSE-CAPILLARY: 236 mg/dL — AB (ref 65–99)
Glucose-Capillary: 240 mg/dL — ABNORMAL HIGH (ref 65–99)
Glucose-Capillary: 251 mg/dL — ABNORMAL HIGH (ref 65–99)
Glucose-Capillary: 296 mg/dL — ABNORMAL HIGH (ref 65–99)
Glucose-Capillary: 305 mg/dL — ABNORMAL HIGH (ref 65–99)
Glucose-Capillary: 255 mg/dL — ABNORMAL HIGH (ref 65–99)

## 2015-06-19 LAB — BASIC METABOLIC PANEL
Anion gap: 7 (ref 5–15)
BUN: 51 mg/dL — AB (ref 6–20)
CALCIUM: 9.5 mg/dL (ref 8.9–10.3)
CHLORIDE: 93 mmol/L — AB (ref 101–111)
CO2: 47 mmol/L — AB (ref 22–32)
CREATININE: 1.42 mg/dL — AB (ref 0.61–1.24)
GFR calc non Af Amer: >60 mL/min (ref >60)
GLUCOSE: 245 mg/dL — AB (ref 65–99)
Potassium: 3.8 mmol/L (ref 3.5–5.1)
Sodium: 147 mmol/L — ABNORMAL HIGH (ref 135–145)

## 2015-06-19 LAB — CBC
HEMATOCRIT: 39.9 % (ref 39.0–52.0)
HEMOGLOBIN: 11.5 g/dL — AB (ref 13.0–17.0)
MCH: 27.6 pg (ref 26.0–34.0)
MCHC: 28.8 g/dL — AB (ref 30.0–36.0)
MCV: 95.7 fL (ref 78.0–100.0)
Platelets: 232 10*3/uL (ref 150–400)
RBC: 4.17 MIL/uL — ABNORMAL LOW (ref 4.22–5.81)
RDW: 14.8 % (ref 11.5–15.5)
WBC: 11.2 10*3/uL — ABNORMAL HIGH (ref 4.0–10.5)

## 2015-06-19 MED ORDER — FUROSEMIDE 10 MG/ML IJ SOLN
40.0000 mg | Freq: Three times a day (TID) | INTRAMUSCULAR | Status: AC
  Administered 2015-06-19 (×2): 40 mg via INTRAVENOUS
  Filled 2015-06-19 (×2): qty 4

## 2015-06-19 MED ORDER — INSULIN ASPART 100 UNIT/ML ~~LOC~~ SOLN
0.0000 [IU] | Freq: Every day | SUBCUTANEOUS | Status: DC
  Administered 2015-06-19: 2 [IU] via SUBCUTANEOUS

## 2015-06-19 MED ORDER — INSULIN ASPART 100 UNIT/ML ~~LOC~~ SOLN
0.0000 [IU] | Freq: Three times a day (TID) | SUBCUTANEOUS | Status: DC
  Administered 2015-06-19: 11 [IU] via SUBCUTANEOUS
  Administered 2015-06-19: 15 [IU] via SUBCUTANEOUS
  Administered 2015-06-19 – 2015-06-20 (×2): 11 [IU] via SUBCUTANEOUS

## 2015-06-19 NOTE — Progress Notes (Signed)
PULMONARY / CRITICAL CARE MEDICINE   Name: Derrick [name] MRN: [id number] DOB: [birth year]    ADMISSION DATE:  06/12/2015 CONSULTATION DATE:  06/13/2015  REFERRING MD :  TRH  CHIEF COMPLAINT:  Hypercarbic respiratory failure and AMS  INITIAL PRESENTATION: 34 year old male with morbid obesity, OSA and OHV who presents to PCCM with AMS.  ABG was ordered and patient was noted to be hypercarbic and with respiratory acidosis.  Subsequently the patient developed hypoxemia as well.  Patient was transferred to the ICU and BiPAP was started.  F/U ABG post BiPAP showed no evidence of improvement and mental status continued to deteriorate so decision was made to intubate.  STUDIES:  CXR 11/13- with cardiomegally and pulmonary edema. Venous doppler 11/13- without DVT, superficial thrombosis, or Baker's cyst CXR 11/14- Hypoventilation with bibasilar atelectasis.  TTE 11/14- Mod concentric LV hypertrophy, EF 55-60%, grade 2 diastolic dysfunction, can't assess PV.  CXR 11/15- Slight interval deterioration in the pulmonary interstitium with increased pulmonary vascular prominence. Developing left lower lobe atelectasis is suspected.  CXR 11/17- Worsening pulmonary edema.  CXR 11/18- Slight improvement in pulmonary vascular congestion. Persistent bibasilar atelectasis or pneumonia CXR 11/19- Persistent mild lung base atelectasis and small effusions without pulmonary edema.   SIGNIFICANT EVENTS: 11/13 intubation for hypercarbic/hypoxic respiratory failure. 11/14- transitioned from Lovenox gtt to heparin gtt 11/15- removed PA catheter and replaced with CVL, decrease Lasix gtt  11/17- transitioned to intermittent Lasix   SUBJECTIVE:   Intubated. RASS score 0, on Fentanyl gtt intermittently. Notes some throat pain from the tube. Doing better on a wean    VITAL SIGNS: Temp:  [98.4 F (36.9 C)-100.3 F (37.9 C)] 99.7 F (37.6 C) (11/19 0333) Pulse Rate:  [81-102] 102 (11/19 0700) Resp:   [14-16] 14 (11/19 0700) BP: (89-132)/(57-92) 132/92 mmHg (11/19 0700) SpO2:  [90 %-97 %] 90 % (11/19 0700) FiO2 (%):  [40 %-50 %] 40 % (11/19 0700) HEMODYNAMICS:   VENTILATOR SETTINGS: Vent Mode:  [-] PRVC FiO2 (%):  [40 %-50 %] 40 % Set Rate:  [14 bmp] 14 bmp Vt Set:  [580 mL] 580 mL PEEP:  [5 cmH20] 5 cmH20 Plateau Pressure:  [25 cmH20-27 cmH20] 25 cmH20 INTAKE / OUTPUT:  Intake/Output Summary (Last 24 hours) at 06/19/15 0738 Last data filed at 06/19/15 0700  Gross per 24 hour  Intake   3125 ml  Output   2100 ml  Net   1025 ml    PHYSICAL EXAMINATION: General:  Obese male alert. Answering questions and writing.  Neuro:  Following commands. Moves all extremities.  Cardiovascular:  Distant heart sounds.  No m/r/g noted.  Lungs:  Rhonchorous breath sounds over the anterior chest wall bilaterally.  Abdomen:  Soft, obese, ND and +BS. Musculoskeletal:  Trace Bilateral LE edema.  Skin:  Intact, lichenification over the LE bilaterally  LABS:  CBC  Recent Labs Lab 06/17/15 0430 06/18/15 0450 06/19/15 0500  WBC 12.8* 10.9* 11.2*  HGB 12.3* 11.6* 11.5*  HCT 40.5 39.8 39.9  PLT 234 206 232   Coag's No results for input(s): APTT, INR in the last 168 hours. BMET  Recent Labs Lab 06/17/15 0430 06/18/15 0450 06/19/15 0500  NA 142 143 147*  K 3.5 3.4* 3.8  CL 89* 90* 93*  CO2 46* 47* 47*  BUN 30* 38* 51*  CREATININE 1.27* 1.23 1.42*  GLUCOSE 235* 281* 245*   Electrolytes  Recent Labs Lab 06/14/15 0835  06/17/15 0430 06/18/15 0450 06/19/15 0500  CALCIUM 8.8*  < >  9.2 9.4 9.5  MG 1.8  --   --  2.1  --   PHOS 2.2*  --   --   --   --   < > = values in this interval not displayed. Sepsis Markers  Recent Labs Lab 06/12/15 1556 06/12/15 1913  LATICACIDVEN 1.58 1.00   ABG  Recent Labs Lab 06/15/15 0800 06/15/15 0950 06/16/15 0358  PHART 7.283* 7.405 7.406  PCO2ART 85.7* 63.6* 68.3*  PO2ART 80.3 68.2* 71.6*   Liver Enzymes  Recent Labs Lab  06/12/15 1539 06/16/15 0400  AST 20 22  ALT 31 20  ALKPHOS 150* 117  BILITOT 1.0 1.0  ALBUMIN 3.8 3.0*   Cardiac Enzymes  Recent Labs Lab 06/13/15 1550 06/13/15 2201 06/14/15 0051  TROPONINI <0.03 <0.03 <0.03   Glucose  Recent Labs Lab 06/18/15 0735 06/18/15 1114 06/18/15 1521 06/18/15 1915 06/18/15 2332 06/19/15 0335  GLUCAP 272* 313* 281* 220* 235* 240*    Imaging Dg Chest Port 1 View  06/19/2015  CLINICAL DATA:  Respiratory failure EXAM: PORTABLE CHEST 1 VIEW COMPARISON:  06/18/2015 FINDINGS: There is hazy lung base opacity similar to the prior study consistent with a combination of atelectasis and small effusions. No pulmonary edema. No pneumothorax. Cardiac silhouette is mildly enlarged. Endotracheal tube, right internal jugular central venous line and orogastric tube are stable in well positioned. No change from the previous day's study. IMPRESSION: 1. No change from the previous day's exam. 2. Persistent mild lung base atelectasis and small effusions. No pulmonary edema. 3. Support apparatus is well positioned. Electronically Signed   By: Amie Portlandavid  Ormond M.D.   On: 06/19/2015 07:03     ASSESSMENT / PLAN:  PULMONARY OETT 11/13>>> A: VDRF, hypercarbic, due to morbid obesity, OSA, OHV and CHF. PE less likely given history P:   - Full vent support. - VAP - Diuresis as below  - Continue to wean, if progressive improvement could consider delaying trach  CARDIOVASCULAR R IJ PAC 11/13>>>11/5, replaced on 11/15 with CVL A: CHF exacerbation due to morbid obesity and likely pulmonary edema and pulmonary HTN. Troponins negative P:  - Net positive +995cc - Lasix 40 q8hr x 2 doses - Holding home lisinopril-HCTZ for now   RENAL A:  Hyponatremia, hypokalemia and increased bicarb. Hypomagnesemia  P:   - KVO IVF. - Replace ectrolytes as indicated. - BMET in AM  GASTROINTESTINAL A:  Nutrition. P:   - Tube Feeds - Protonix daily  HEMATOLOGIC A:  No active  issues. DVT ppx P:  - SQ heparin - CBC in AM. - Transfuse per ICU protocol.  INFECTIOUS A:  No evidence of infection. P:   - Monitor WBC and fever curve.  ENDOCRINE A:  DM.   A1c this hospitalization was 10.1 Hyperglycemia  P:   - Change to resistant SSI  -Novolog to 4 units q4hrs while on tube feeds - Lantus 10u qHS - CBGs.  NEUROLOGIC A:  AMS due to hypercarbia P:   RASS goal: 0 Fentanyl PRN WUA   FAMILY  - Updates: Family updated 11/18  Joanna Puffrystal S. Melodye Swor, MD [facility] Resident  06/19/2015, 7:38 AM

## 2015-06-20 ENCOUNTER — Inpatient Hospital Stay (HOSPITAL_COMMUNITY): Payer: 59

## 2015-06-20 LAB — URINALYSIS, ROUTINE W REFLEX MICROSCOPIC
BILIRUBIN URINE: NEGATIVE
Glucose, UA: NEGATIVE mg/dL
KETONES UR: NEGATIVE mg/dL
NITRITE: NEGATIVE
PH: 7.5 (ref 5.0–8.0)
SPECIFIC GRAVITY, URINE: 1.03 (ref 1.005–1.030)

## 2015-06-20 LAB — BASIC METABOLIC PANEL
Anion gap: 8 (ref 5–15)
BUN: 54 mg/dL — AB (ref 6–20)
CO2: 45 mmol/L — ABNORMAL HIGH (ref 22–32)
CREATININE: 1.3 mg/dL — AB (ref 0.61–1.24)
Calcium: 9.7 mg/dL (ref 8.9–10.3)
Chloride: 96 mmol/L — ABNORMAL LOW (ref 101–111)
GFR calc Af Amer: >60 mL/min (ref >60)
GLUCOSE: 281 mg/dL — AB (ref 65–99)
POTASSIUM: 3.7 mmol/L (ref 3.5–5.1)
Sodium: 149 mmol/L — ABNORMAL HIGH (ref 135–145)

## 2015-06-20 LAB — URINE MICROSCOPIC-ADD ON

## 2015-06-20 LAB — CBC
HEMATOCRIT: 40.2 % (ref 39.0–52.0)
Hemoglobin: 11.8 g/dL — ABNORMAL LOW (ref 13.0–17.0)
MCH: 28 pg (ref 26.0–34.0)
MCHC: 29.4 g/dL — AB (ref 30.0–36.0)
MCV: 95.5 fL (ref 78.0–100.0)
Platelets: 241 10*3/uL (ref 150–400)
RBC: 4.21 MIL/uL — ABNORMAL LOW (ref 4.22–5.81)
RDW: 14.7 % (ref 11.5–15.5)
WBC: 10.9 10*3/uL — ABNORMAL HIGH (ref 4.0–10.5)

## 2015-06-20 LAB — GLUCOSE, CAPILLARY
GLUCOSE-CAPILLARY: 290 mg/dL — AB (ref 65–99)
Glucose-Capillary: 259 mg/dL — ABNORMAL HIGH (ref 65–99)
Glucose-Capillary: 301 mg/dL — ABNORMAL HIGH (ref 65–99)
Glucose-Capillary: 293 mg/dL — ABNORMAL HIGH (ref 65–99)

## 2015-06-20 MED ORDER — DEXMEDETOMIDINE HCL IN NACL 400 MCG/100ML IV SOLN
0.0000 ug/kg/h | INTRAVENOUS | Status: DC
  Administered 2015-06-20: 0.4 ug/kg/h via INTRAVENOUS
  Administered 2015-06-20: 1.2 ug/kg/h via INTRAVENOUS
  Administered 2015-06-20: 0.9 ug/kg/h via INTRAVENOUS
  Administered 2015-06-20: 0.8 ug/kg/h via INTRAVENOUS
  Administered 2015-06-20: 0.4 ug/kg/h via INTRAVENOUS
  Administered 2015-06-21 – 2015-06-22 (×15): 1.2 ug/kg/h via INTRAVENOUS
  Filled 2015-06-20 (×17): qty 100
  Filled 2015-06-20: qty 50
  Filled 2015-06-20 (×3): qty 100

## 2015-06-20 MED ORDER — ACETAMINOPHEN 160 MG/5ML PO SOLN
650.0000 mg | Freq: Four times a day (QID) | ORAL | Status: DC | PRN
  Administered 2015-06-20 – 2015-06-21 (×5): 650 mg via ORAL
  Filled 2015-06-20 (×6): qty 20.3

## 2015-06-20 MED ORDER — FENTANYL CITRATE (PF) 100 MCG/2ML IJ SOLN
100.0000 ug | INTRAMUSCULAR | Status: DC | PRN
  Administered 2015-06-21 – 2015-06-28 (×8): 100 ug via INTRAVENOUS
  Filled 2015-06-20 (×4): qty 2

## 2015-06-20 MED ORDER — INSULIN GLARGINE 100 UNIT/ML ~~LOC~~ SOLN: 15.0000 [IU] | Freq: Every day | SUBCUTANEOUS | Status: DC

## 2015-06-20 MED ORDER — INSULIN GLARGINE 100 UNIT/ML ~~LOC~~ SOLN
15.0000 [IU] | Freq: Every day | SUBCUTANEOUS | Status: DC
  Administered 2015-06-20: 15 [IU] via SUBCUTANEOUS
  Filled 2015-06-20 (×2): qty 0.15

## 2015-06-20 MED ORDER — SENNOSIDES 8.8 MG/5ML PO SYRP
5.0000 mL | ORAL_SOLUTION | Freq: Two times a day (BID) | ORAL | Status: DC | PRN
  Filled 2015-06-20: qty 5

## 2015-06-20 MED ORDER — FUROSEMIDE 10 MG/ML IJ SOLN
40.0000 mg | Freq: Three times a day (TID) | INTRAMUSCULAR | Status: AC
  Administered 2015-06-20 (×2): 40 mg via INTRAVENOUS
  Filled 2015-06-20 (×2): qty 4

## 2015-06-20 MED ORDER — BISACODYL 10 MG RE SUPP: 10.0000 mg | Freq: Every day | RECTAL | Status: DC | PRN

## 2015-06-20 MED ORDER — LEVOFLOXACIN 25 MG/ML PO SOLN
500.0000 mg | ORAL | Status: DC
  Administered 2015-06-20: 500 mg
  Filled 2015-06-20 (×2): qty 20

## 2015-06-20 MED ORDER — FREE WATER
100.0000 mL | Freq: Three times a day (TID) | Status: DC
  Administered 2015-06-20 – 2015-06-21 (×5): 100 mL

## 2015-06-20 MED ORDER — MIDAZOLAM HCL 2 MG/2ML IJ SOLN
2.0000 mg | INTRAMUSCULAR | Status: DC | PRN
  Administered 2015-06-20 – 2015-06-22 (×9): 2 mg via INTRAVENOUS
  Filled 2015-06-20 (×10): qty 2

## 2015-06-20 MED ORDER — INSULIN ASPART 100 UNIT/ML ~~LOC~~ SOLN
0.0000 [IU] | SUBCUTANEOUS | Status: DC
  Administered 2015-06-20: 11 [IU] via SUBCUTANEOUS
  Administered 2015-06-20: 15 [IU] via SUBCUTANEOUS
  Administered 2015-06-20 – 2015-06-21 (×2): 11 [IU] via SUBCUTANEOUS
  Administered 2015-06-21 (×2): 15 [IU] via SUBCUTANEOUS
  Administered 2015-06-21: 7 [IU] via SUBCUTANEOUS
  Administered 2015-06-21 (×2): 15 [IU] via SUBCUTANEOUS
  Administered 2015-06-21: 11 [IU] via SUBCUTANEOUS
  Administered 2015-06-22: 4 [IU] via SUBCUTANEOUS
  Administered 2015-06-22: 7 [IU] via SUBCUTANEOUS
  Administered 2015-06-22: 4 [IU] via SUBCUTANEOUS
  Administered 2015-06-22: 11 [IU] via SUBCUTANEOUS
  Administered 2015-06-22: 4 [IU] via SUBCUTANEOUS
  Administered 2015-06-23: 7 [IU] via SUBCUTANEOUS
  Administered 2015-06-23: 4 [IU] via SUBCUTANEOUS
  Administered 2015-06-23: 7 [IU] via SUBCUTANEOUS
  Administered 2015-06-23: 4 [IU] via SUBCUTANEOUS
  Administered 2015-06-23: 7 [IU] via SUBCUTANEOUS
  Administered 2015-06-24: 11 [IU] via SUBCUTANEOUS
  Administered 2015-06-24: 7 [IU] via SUBCUTANEOUS
  Administered 2015-06-24: 11 [IU] via SUBCUTANEOUS
  Administered 2015-06-24 (×2): 4 [IU] via SUBCUTANEOUS
  Administered 2015-06-24 – 2015-06-25 (×2): 11 [IU] via SUBCUTANEOUS
  Administered 2015-06-25: 15 [IU] via SUBCUTANEOUS
  Administered 2015-06-25 (×3): 7 [IU] via SUBCUTANEOUS
  Administered 2015-06-25: 15 [IU] via SUBCUTANEOUS
  Administered 2015-06-26: 7 [IU] via SUBCUTANEOUS
  Administered 2015-06-26: 4 [IU] via SUBCUTANEOUS
  Administered 2015-06-26: 7 [IU] via SUBCUTANEOUS

## 2015-06-20 MED ORDER — SODIUM CHLORIDE 0.9 % IV SOLN
0.0000 ug/h | INTRAVENOUS | Status: DC
  Administered 2015-06-20: 100 ug/h via INTRAVENOUS
  Administered 2015-06-21 (×3): 200 ug/h via INTRAVENOUS
  Administered 2015-06-23: 100 ug/h via INTRAVENOUS
  Filled 2015-06-20 (×5): qty 50

## 2015-06-20 MED ORDER — INSULIN ASPART 100 UNIT/ML ~~LOC~~ SOLN: 6.0000 [IU] | SUBCUTANEOUS | Status: DC

## 2015-06-20 MED ORDER — ALTEPLASE 2 MG IJ SOLR
2.0000 mg | Freq: Once | INTRAMUSCULAR | Status: DC
  Filled 2015-06-20: qty 2

## 2015-06-20 NOTE — Progress Notes (Signed)
Temp 102.5 called to CCM nurse bradshaw, urine clear yellow. Nurse Ermalinda Memosbradshaw comments will inform CCM md

## 2015-06-20 NOTE — Progress Notes (Signed)
PULMONARY / CRITICAL CARE MEDICINE   Name: Derrick HansenReginald A [name] MRN: [id number] DOB: June 10, 1981    ADMISSION DATE:  06/12/2015 CONSULTATION DATE:  06/13/2015  REFERRING MD :  TRH  CHIEF COMPLAINT:  Hypercarbic respiratory failure and AMS  INITIAL PRESENTATION: 34 year old male with morbid obesity, OSA and OHV who presents to PCCM with AMS.  ABG was ordered and patient was noted to be hypercarbic and with respiratory acidosis.  Subsequently the patient developed hypoxemia as well.  Patient was transferred to the ICU and BiPAP was started.  F/U ABG post BiPAP showed no evidence of improvement and mental status continued to deteriorate so decision was made to intubate.  STUDIES:  CXR 11/13- with cardiomegally and pulmonary edema. Venous doppler 11/13- without DVT, superficial thrombosis, or Baker's cyst CXR 11/14- Hypoventilation with bibasilar atelectasis.  TTE 11/14- Mod concentric LV hypertrophy, EF 55-60%, grade 2 diastolic dysfunction, can't assess PV.  CXR 11/15- Slight interval deterioration in the pulmonary interstitium with increased pulmonary vascular prominence. Developing left lower lobe atelectasis is suspected.  CXR 11/17- Worsening pulmonary edema.  CXR 11/18- Slight improvement in pulmonary vascular congestion. Persistent bibasilar atelectasis or pneumonia CXR 11/19- Persistent mild lung base atelectasis and small effusions without pulmonary edema.   SIGNIFICANT EVENTS: 11/13 intubation for hypercarbic/hypoxic respiratory failure. 11/14- transitioned from Lovenox gtt to heparin gtt 11/15- removed PA catheter and replaced with CVL, decrease Lasix gtt  11/17- transitioned to intermittent Lasix   SUBJECTIVE:   Intubated. Unable to wean this am w/ desats .      VITAL SIGNS: Temp:  [99.7 F (37.6 C)-100.6 F (38.1 C)] 100.6 F (38.1 C) (11/20 0747) Pulse Rate:  [91-108] 94 (11/20 0800) Resp:  [11-26] 14 (11/20 0800) BP: (93-163)/(76-128) 133/83 mmHg (11/20  0800) SpO2:  [92 %-96 %] 92 % (11/20 0800) FiO2 (%):  [50 %] 50 % (11/20 0800) Weight:  [156.491 kg (345 lb)] 156.491 kg (345 lb) (11/20 0600) HEMODYNAMICS:   VENTILATOR SETTINGS: Vent Mode:  [-] PRVC FiO2 (%):  [50 %] 50 % Set Rate:  [14 bmp] 14 bmp Vt Set:  [580 mL] 580 mL PEEP:  [5 cmH20] 5 cmH20 Pressure Support:  [12 cmH20] 12 cmH20 Plateau Pressure:  [25 cmH20-27 cmH20] 27 cmH20 INTAKE / OUTPUT:  Intake/Output Summary (Last 24 hours) at 06/20/15 [id number] Last data filed at 06/20/15 0800  Gross per 24 hour  Intake 2797.5 ml  Output   2800 ml  Net   -2.5 ml    PHYSICAL EXAMINATION: General:  Obese male alert. Answering questions and writing.  Neuro:  Following commands. Moves all extremities.  Cardiovascular:  Distant heart sounds.  No m/r/g noted.  Lungs:  Rhonchorous breath sounds over the anterior chest wall bilaterally.  Abdomen:  Soft, obese, ND and +BS. Musculoskeletal:  Trace Bilateral LE edema.  Skin:  Intact, lichenification over the LE bilaterally  LABS:  CBC  Recent Labs Lab 06/18/15 0450 06/19/15 0500 06/20/15 0419  WBC 10.9* 11.2* 10.9*  HGB 11.6* 11.5* 11.8*  HCT 39.8 39.9 40.2  PLT 206 232 241   Coag's No results for input(s): APTT, INR in the last 168 hours. BMET  Recent Labs Lab 06/18/15 0450 06/19/15 0500 06/20/15 0419  NA 143 147* 149*  K 3.4* 3.8 3.7  CL 90* 93* 96*  CO2 47* 47* 45*  BUN 38* 51* 54*  CREATININE 1.23 1.42* 1.30*  GLUCOSE 281* 245* 281*   Electrolytes  Recent Labs Lab 06/14/15 0835  06/18/15 0450 06/19/15 0500  06/20/15 0419  CALCIUM 8.8*  < > 9.4 9.5 9.7  MG 1.8  --  2.1  --   --   PHOS 2.2*  --   --   --   --   < > = values in this interval not displayed. Sepsis Markers No results for input(s): LATICACIDVEN, PROCALCITON, O2SATVEN in the last 168 hours. ABG  Recent Labs Lab 06/15/15 0800 06/15/15 0950 06/16/15 0358  PHART 7.283* 7.405 7.406  PCO2ART 85.7* 63.6* 68.3*  PO2ART 80.3 68.2* 71.6*    Liver Enzymes  Recent Labs Lab 06/16/15 0400  AST 22  ALT 20  ALKPHOS 117  BILITOT 1.0  ALBUMIN 3.0*   Cardiac Enzymes  Recent Labs Lab 06/13/15 1550 06/13/15 2201 06/14/15 0051  TROPONINI <0.03 <0.03 <0.03   Glucose  Recent Labs Lab 06/19/15 0754 06/19/15 1209 06/19/15 1630 06/19/15 2030 06/19/15 2201 06/20/15 0749  GLUCAP 251* 296* 305* 255* 236* 259*    Imaging No results found.   ASSESSMENT / PLAN:  PULMONARY OETT 11/13>>> A: VDRF, hypercarbic, due to morbid obesity, OSA, OHV and CHF. PE less likely given history P:   - Full vent support. - VAP - Diuresis as b/p and scr allow  - Continue to wean, if unable may need trach   CARDIOVASCULAR R IJ PAC 11/13>>>11/5, replaced on 11/15 with CVL A: CHF exacerbation due to morbid obesity and likely pulmonary edema and pulmonary HTN. Troponins negative 11/20 >near even bal over last 24hr , Net -5L since admit  P:   - Lasix 40 q8hr x 2 doses - Holding home lisinopril-HCTZ for now   RENAL A:  Hyponatremia resolved  Hypokalemia resolved  Hypomagnesemia resolved  Hpernatremia  P:   - KVO IVF. - Replace ectrolytes as indicated. - BMET in AM -add free water 100cc q8   GASTROINTESTINAL A:  Nutrition. P:   - Tube Feeds - Protonix daily  HEMATOLOGIC A:  Anemia  DVT ppx P:  - SQ heparin - CBC in AM. - Transfuse per ICU protocol.  INFECTIOUS A: low grade fever  P:   - Monitor WBC and fever curve., pancx if rises   ENDOCRINE A:  DM.   A1c this hospitalization was 10.1 Hyperglycemia  P:   -  resistant SSI  Increased novolog to 6 units q4hrs while on tube feeds - Lantus 15u qHS - CBGs.  NEUROLOGIC A:  AMS due to hypercarbia P:   RASS goal: 0 Fentanyl PRN WUA   FAMILY  - Updates: Family updated 11/20 per Dr. Beaulah Dinning Parrett NP-C  [facility]  [phone]    06/20/2015, 8:38 AM

## 2015-06-20 NOTE — Progress Notes (Signed)
eLink Physician-Brief Progress Note Patient Name: Derrick HansenReginald A [name] DOB: April 16, 1981 MRN: [id number]   Date of Service  06/20/2015  HPI/Events of Note  Near extubation On max precedex gtt  eICU Interventions  Add fent gtt Versed prn     Intervention Category Major Interventions: Delirium, psychosis, severe agitation - evaluation and management  Ean Gettel V. 06/20/2015, 10:42 PM

## 2015-06-20 NOTE — Progress Notes (Signed)
Fentanyl 25 cc wasted in sink, witnessed by American FinancialKatrice RN

## 2015-06-20 NOTE — Progress Notes (Signed)
eLink Physician-Brief Progress Note Patient Name: Derrick HansenReginald A Borba DOB: Dec 09, 1980 MRN: [id number]   Date of Service  06/20/2015  HPI/Events of Note  High fever  eICU Interventions  Pan cx Start levaquin empiric     Intervention Category Major Interventions: Infection - evaluation and management  ALVA,RAKESH V. 06/20/2015, 9:06 PM

## 2015-06-21 ENCOUNTER — Encounter (HOSPITAL_COMMUNITY): Payer: Self-pay | Admitting: Certified Registered Nurse Anesthetist

## 2015-06-21 ENCOUNTER — Inpatient Hospital Stay (HOSPITAL_COMMUNITY): Payer: 59

## 2015-06-21 ENCOUNTER — Inpatient Hospital Stay (HOSPITAL_COMMUNITY): Payer: 59 | Admitting: Certified Registered Nurse Anesthetist

## 2015-06-21 ENCOUNTER — Encounter (HOSPITAL_COMMUNITY)
Admission: EM | Disposition: A | Discharge status: Nursing Home (Includes ICF) | Payer: Self-pay | Source: Home / Self Care | Attending: Pulmonary Disease

## 2015-06-21 DIAGNOSIS — E872 Acidosis: Secondary | ICD-10-CM

## 2015-06-21 DIAGNOSIS — J9600 Acute respiratory failure, unspecified whether with hypoxia or hypercapnia: Secondary | ICD-10-CM

## 2015-06-21 DIAGNOSIS — J9819 Other pulmonary collapse: Secondary | ICD-10-CM | POA: Diagnosis present

## 2015-06-21 HISTORY — PX: TRACHEOSTOMY TUBE PLACEMENT: SHX814

## 2015-06-21 LAB — GLUCOSE, CAPILLARY
GLUCOSE-CAPILLARY: 266 mg/dL — AB (ref 65–99)
Glucose-Capillary: 241 mg/dL — ABNORMAL HIGH (ref 65–99)
Glucose-Capillary: 313 mg/dL — ABNORMAL HIGH (ref 65–99)
Glucose-Capillary: 327 mg/dL — ABNORMAL HIGH (ref 65–99)
Glucose-Capillary: 343 mg/dL — ABNORMAL HIGH (ref 65–99)
Glucose-Capillary: 346 mg/dL — ABNORMAL HIGH (ref 65–99)

## 2015-06-21 LAB — CBC
HCT: 42 % (ref 39.0–52.0)
Hemoglobin: 12.6 g/dL — ABNORMAL LOW (ref 13.0–17.0)
MCH: 28.3 pg (ref 26.0–34.0)
MCHC: 30 g/dL (ref 30.0–36.0)
MCV: 94.2 fL (ref 78.0–100.0)
PLATELETS: 249 10*3/uL (ref 150–400)
RBC: 4.46 MIL/uL (ref 4.22–5.81)
RDW: 14.3 % (ref 11.5–15.5)
WBC: 10.6 10*3/uL — AB (ref 4.0–10.5)

## 2015-06-21 LAB — PROTIME-INR
INR: 1.35 (ref 0.00–1.49)
PROTHROMBIN TIME: 16.8 s — AB (ref 11.6–15.2)

## 2015-06-21 LAB — BASIC METABOLIC PANEL
Anion gap: 7 (ref 5–15)
BUN: 52 mg/dL — AB (ref 6–20)
CALCIUM: 9.6 mg/dL (ref 8.9–10.3)
CHLORIDE: 98 mmol/L — AB (ref 101–111)
CO2: 44 mmol/L — ABNORMAL HIGH (ref 22–32)
CREATININE: 1.28 mg/dL — AB (ref 0.61–1.24)
GFR calc Af Amer: >60 mL/min (ref >60)
GFR calc non Af Amer: >60 mL/min (ref >60)
Glucose, Bld: 381 mg/dL — ABNORMAL HIGH (ref 65–99)
Potassium: 3.7 mmol/L (ref 3.5–5.1)
SODIUM: 149 mmol/L — AB (ref 135–145)

## 2015-06-21 SURGERY — CREATION, TRACHEOSTOMY
Anesthesia: General | Site: Neck

## 2015-06-21 MED ORDER — ROCURONIUM BROMIDE 100 MG/10ML IV SOLN
INTRAVENOUS | Status: DC | PRN
  Administered 2015-06-21 (×2): 50 mg via INTRAVENOUS

## 2015-06-21 MED ORDER — FENTANYL CITRATE (PF) 250 MCG/5ML IJ SOLN
INTRAMUSCULAR | Status: AC
  Filled 2015-06-21: qty 5

## 2015-06-21 MED ORDER — ETOMIDATE 2 MG/ML IV SOLN
INTRAVENOUS | Status: AC
  Administered 2015-06-21: 20 mg
  Filled 2015-06-21: qty 10

## 2015-06-21 MED ORDER — MIDAZOLAM HCL 2 MG/2ML IJ SOLN
INTRAMUSCULAR | Status: AC
  Filled 2015-06-21: qty 2

## 2015-06-21 MED ORDER — LACTATED RINGERS IV SOLN
INTRAVENOUS | Status: DC | PRN
  Administered 2015-06-21: 18:00:00 via INTRAVENOUS

## 2015-06-21 MED ORDER — 0.9 % SODIUM CHLORIDE (POUR BTL) OPTIME
TOPICAL | Status: DC | PRN
  Administered 2015-06-21: 1000 mL

## 2015-06-21 MED ORDER — LINEZOLID 600 MG/300ML IV SOLN
600.0000 mg | Freq: Two times a day (BID) | INTRAVENOUS | Status: DC
  Administered 2015-06-21 – 2015-06-22 (×4): 600 mg via INTRAVENOUS
  Filled 2015-06-21 (×5): qty 300

## 2015-06-21 MED ORDER — CEFTAZIDIME 2 G IJ SOLR
2.0000 g | Freq: Three times a day (TID) | INTRAMUSCULAR | Status: DC
  Administered 2015-06-21 – 2015-06-24 (×9): 2 g via INTRAVENOUS
  Filled 2015-06-21 (×11): qty 2

## 2015-06-21 MED ORDER — INSULIN GLARGINE 100 UNIT/ML ~~LOC~~ SOLN
25.0000 [IU] | Freq: Every day | SUBCUTANEOUS | Status: DC
  Administered 2015-06-21 – 2015-06-24 (×4): 25 [IU] via SUBCUTANEOUS
  Filled 2015-06-21 (×4): qty 0.25

## 2015-06-21 MED ORDER — ACETYLCYSTEINE 20 % IN SOLN
3.0000 mL | Freq: Two times a day (BID) | RESPIRATORY_TRACT | Status: AC
  Administered 2015-06-21: 4 mL via RESPIRATORY_TRACT
  Administered 2015-06-21 – 2015-06-22 (×2): 3 mL via RESPIRATORY_TRACT
  Administered 2015-06-22: 4 mL via RESPIRATORY_TRACT
  Filled 2015-06-21 (×4): qty 4

## 2015-06-21 MED ORDER — FUROSEMIDE 10 MG/ML IJ SOLN
40.0000 mg | Freq: Three times a day (TID) | INTRAMUSCULAR | Status: AC
  Administered 2015-06-21 (×2): 40 mg via INTRAVENOUS
  Filled 2015-06-21 (×2): qty 4

## 2015-06-21 MED ORDER — LIDOCAINE-EPINEPHRINE 1 %-1:100000 IJ SOLN
INTRAMUSCULAR | Status: AC
  Filled 2015-06-21: qty 1

## 2015-06-21 MED ORDER — PROPOFOL 10 MG/ML IV BOLUS
INTRAVENOUS | Status: DC | PRN
  Administered 2015-06-21: 50 mg via INTRAVENOUS

## 2015-06-21 MED ORDER — LIDOCAINE-EPINEPHRINE 1 %-1:100000 IJ SOLN
INTRAMUSCULAR | Status: DC | PRN
  Administered 2015-06-21: 5 mL

## 2015-06-21 MED ORDER — ALBUTEROL SULFATE (2.5 MG/3ML) 0.083% IN NEBU
2.5000 mg | INHALATION_SOLUTION | Freq: Two times a day (BID) | RESPIRATORY_TRACT | Status: AC
  Administered 2015-06-21 – 2015-06-22 (×4): 2.5 mg via RESPIRATORY_TRACT
  Filled 2015-06-21 (×4): qty 3

## 2015-06-21 SURGICAL SUPPLY — 45 items
BLADE 10 SAFETY STRL DISP (BLADE) ×2 IMPLANT
BLADE 11 SAFETY STRL DISP (BLADE) IMPLANT
BLADE 15 SAFETY STRL DISP (BLADE) IMPLANT
BLADE SURG 10 STRL SS (BLADE) ×1 IMPLANT
BLADE SURG 15 STRL LF DISP TIS (BLADE) IMPLANT
BLADE SURG 15 STRL SS (BLADE)
BLADE SURG ROTATE 9660 (MISCELLANEOUS) IMPLANT
CANISTER SUCTION 2500CC (MISCELLANEOUS) ×2 IMPLANT
CLEANER TIP ELECTROSURG 2X2 (MISCELLANEOUS) ×2 IMPLANT
COVER SURGICAL LIGHT HANDLE (MISCELLANEOUS) ×2 IMPLANT
DECANTER SPIKE VIAL GLASS SM (MISCELLANEOUS) ×2 IMPLANT
DRAPE PROXIMA HALF (DRAPES) IMPLANT
ELECT COATED BLADE 2.86 ST (ELECTRODE) ×2 IMPLANT
ELECT REM PT RETURN 9FT ADLT (ELECTROSURGICAL) ×2
ELECTRODE REM PT RTRN 9FT ADLT (ELECTROSURGICAL) ×1 IMPLANT
GAUZE SPONGE 4X4 16PLY XRAY LF (GAUZE/BANDAGES/DRESSINGS) ×1 IMPLANT
GAUZE XEROFORM 5X9 LF (GAUZE/BANDAGES/DRESSINGS) IMPLANT
GLOVE BIOGEL PI IND STRL 7.0 (GLOVE) IMPLANT
GLOVE BIOGEL PI INDICATOR 7.0 (GLOVE) ×1
GLOVE ECLIPSE 7.5 STRL STRAW (GLOVE) ×2 IMPLANT
GLOVE SURG SS PI 6.5 STRL IVOR (GLOVE) ×1 IMPLANT
GOWN STRL REUS W/ TWL LRG LVL3 (GOWN DISPOSABLE) ×3 IMPLANT
GOWN STRL REUS W/TWL LRG LVL3 (GOWN DISPOSABLE) ×4
HOLDER TRACH TUBE VELCRO 19.5 (MISCELLANEOUS) IMPLANT
KIT BASIN OR (CUSTOM PROCEDURE TRAY) ×2 IMPLANT
KIT ROOM TURNOVER OR (KITS) ×2 IMPLANT
KIT SUCTION CATH 14FR (SUCTIONS) ×1 IMPLANT
NDL HYPO 25GX1X1/2 BEV (NEEDLE) ×1 IMPLANT
NEEDLE HYPO 25GX1X1/2 BEV (NEEDLE) ×2 IMPLANT
NS IRRIG 1000ML POUR BTL (IV SOLUTION) ×2 IMPLANT
PAD ARMBOARD 7.5X6 YLW CONV (MISCELLANEOUS) ×4 IMPLANT
PENCIL BUTTON HOLSTER BLD 10FT (ELECTRODE) ×1 IMPLANT
SPONGE INTESTINAL PEANUT (DISPOSABLE) ×2 IMPLANT
SUT CHROMIC 3 0 SH 27 (SUTURE) ×1 IMPLANT
SUT PROLENE 2 0 SH DA (SUTURE) ×2 IMPLANT
SUT SILK 3 0 (SUTURE) ×2
SUT SILK 3-0 18XBRD TIE 12 (SUTURE) ×1 IMPLANT
SYR 20CC LL (SYRINGE) IMPLANT
SYR BULB 3OZ (MISCELLANEOUS) IMPLANT
SYR CONTROL 10ML LL (SYRINGE) IMPLANT
TOWEL OR 17X24 6PK STRL BLUE (TOWEL DISPOSABLE) ×2 IMPLANT
TRAY ENT MC OR (CUSTOM PROCEDURE TRAY) ×2 IMPLANT
TUBE CONNECTING 12X1/4 (SUCTIONS) ×2 IMPLANT
TUBE TRACH 7.0 EXL PROX CUF (TUBING) ×1 IMPLANT
WATER STERILE IRR 1000ML POUR (IV SOLUTION) ×2 IMPLANT

## 2015-06-21 NOTE — Anesthesia Postprocedure Evaluation (Signed)
Anesthesia Post Note  Patient: Derrick HansenReginald A [name]  Procedure(s) Performed: Procedure(s) (LRB): TRACHEOSTOMY (N/A)  Patient location during evaluation: ICU Anesthesia Type: General Level of consciousness: sedated Pain management: pain level controlled Vital Signs Assessment: post-procedure vital signs reviewed and stable Respiratory status: patient on ventilator - see flowsheet for VS Cardiovascular status: blood pressure returned to baseline Postop Assessment: No signs of nausea or vomiting Anesthetic complications: no    Last Vitals:  Filed Vitals:   06/21/15 2100 06/21/15 2130  BP: 121/70 126/75  Pulse: 88 95  Temp:    Resp: 14 15    Last Pain:  Filed Vitals:   06/21/15 2139  PainSc: 0-No pain    LLE Motor Response: Purposeful movement   RLE Motor Response: Purposeful movement        Kennieth RadFitzgerald, Peyten Punches E

## 2015-06-21 NOTE — Care Management Note (Addendum)
Case Management Note  Patient Details  Name: Arturo A Armato MRN: [id number] Date of Birth: 06/26/1981  Subjective/ObjMitzi Hansenective:    No insurance listed, however states has insurance - card at home - depending on insurance may by Ltach candidate post trach for weaning.  Discussed with wife to bring insurance cards in.  [name], Keyasha Miah [name] 06-17-15  06-18-15 Again talked with wife, she did visit yesterday, does not know where cards are, talked with Mom and patient in room.  State it is a [employer] policy but don't know where the information is.  Will try to locate over the weekend.  Discussed why we are needing it, to see if insurance does have an Ltach benefit.  Mom tearful, but understands that if he gets trached and has the benefit this would be the place to go for hopeful vent liberation.   06-21-15 Talked with Mom about insurance - they could not find any cards over the weekend.  Mom feels he has insurance.  Called our financial counselor and she states [facility] no longer active.  Updated Mom.  Patient very agitated.  Per Artistfinancial counselor, if wife eligible for Medicaid due to small child in house - he could be on hers.  Informed mother of this.  Plan for trach tomorrow, would be good Ltach candidate.                 Action/Plan:   Expected Discharge Date:  06/24/15               Expected Discharge Plan:  Long Term Acute Care (LTAC)  In-House Referral:     Discharge planning Services  CM Consult  Post Acute Care Choice:    Choice offered to:     DME Arranged:    DME Agency:     HH Arranged:    HH Agency:     Status of Service:  In process, will continue to follow  Medicare Important Message Given:    Date Medicare IM Given:    Medicare IM give by:    Date Additional Medicare IM Given:    Additional Medicare Important Message give by:     If discussed at Long Length of Stay Meetings, dates discussed:    Additional Comments:  [name], Rue Valladares [name],  RN 06/21/2015, 2:23 PM

## 2015-06-21 NOTE — Transfer of Care (Signed)
Immediate Anesthesia Transfer of Care Note  Patient: Derrick [name]  Procedure(s) Performed: Procedure(s): TRACHEOSTOMY (N/A)  Patient Location: ICU  Anesthesia Type:General  Level of Consciousness: sedated and Patient remains intubated per anesthesia plan  Airway & Oxygen Therapy: Patient remains intubated per anesthesia plan and Patient placed on Ventilator (see vital sign flow sheet for setting)  Post-op Assessment: Report given to RN and Post -op Vital signs reviewed and stable  Post vital signs: stable  Last Vitals:  Filed Vitals:   06/21/15 1700 06/21/15 1855  BP: 156/108 149/102  Pulse: 101 82  Temp:    Resp: 18 14    Complications: No apparent anesthesia complications

## 2015-06-21 NOTE — Procedures (Addendum)
Mini BAL Procedure Note Derrick HansenReginald A [name] [id number] 11/05/1980  Procedure: Mini Bronchial Alveolar Lavage  Procedure Details: In preparation for procedure, Patient hyper-oxygenated with 100 % FiO2 Sterile Technique used:gloves, gown and mask Amount of Saline administered: 30 (ml) Specimen amount collected: 20 (ml)  Evaluation: BP 130/80 mmHg  Pulse 83  Temp(Src) 102.1 F (38.9 C) (Oral)  Resp 15  Ht 5\' 10"  (1.778 [name])  Wt 429 lb 14.4 oz (195 kg)  BMI 61.68 kg/m2  SpO2 97% O2 sats: stable throughout Breath Sounds: Rhonch Patient's Current Condition: stable Complications: No apparent complications Patient did tolerate procedure well.   Pt tolerated bedside bronchoscopy well with no complications. Pt with copious amount of thick yellow/tan secretions removed and sample [place]. Pt pre-oxygenated with 100% and remained on 100% throughout procedure. Pt spo2 >96% throughout. Pt returned to 50% following procedure. Pt stable with no complications. RT will continue to monitor.   Carolan ShiverKelley, Derrick [name] 06/21/2015, 3:12 PM

## 2015-06-21 NOTE — Op Note (Signed)
DATE OF PROCEDURE:  06/21/2015                              OPERATIVE REPORT  SURGEON:  Newman PiesSu Teaira Croft, MD  PREOPERATIVE DIAGNOSES: 1. Respiratory failure. 2. Ventilator dependence  POSTOPERATIVE DIAGNOSES: 1. Respiratory failure. 2. Ventilator dependence  PROCEDURE PERFORMED:  Tracheostomy  ANESTHESIA:  General endotracheal tube anesthesia.  COMPLICATIONS:  None.  ESTIMATED BLOOD LOSS:  Minimal.  INDICATION FOR PROCEDURE:  Derrick [name] is a 34 y.o. male with a history of morbid obesity, respiratory failure and ventilator dependence. The decision was therefore made to proceed with tracheostomy tube placement. Likelihood of success in ventilator weaning was also discussed.  Questions were invited and answered.  Informed consent was obtained.  DESCRIPTION:  The patient was taken to the operating room and placed supine on the operating table. General anesthesia was administered via the pre-existing endotracheal tube. The patient was positioned and prepped and draped in the standard fashion for tracheostomy tube placement. 1% lidocaine with 1-100,000 epinephrine was injected at the anterior neck. A 2 cm vertical incision was made in the anterior necks, at the level slightly below the cricoid bone. The incision was carried down past the level of the platysma muscle. The strep muscles were identified and divided in midline. They were retracted laterally, exposing the thyroid gland. The thyroid was divided at midline and retracted laterally. The anterior tracheal wall was exposed. A tracheal window was made at the level of the second tracheal ring. The endotracheal tube was withdrawn. A # 7 cuffed Shiley XLT tracheostomy tube was placed without difficulty. Good end tidal volume and CO2 return was noted. The tracheostomy tube was secured in place with 2-0 Prolene sutures and circumferential necktie. The care of the patient was transferred to the anesthesiologist. The patient was awakened from  anesthesia without difficulty. He was transferred back to the intensive care unit in stable condition.  OPERATIVE FINDINGS:  A # 7 cuffed Shiley XLT tracheostomy tube was placed without difficulty.  SPECIMEN:  None.  FOLLOWUP CARE:  The patient will return to the ICU.   Darletta MollEOH,SUI W 06/21/2015 6:41 PM

## 2015-06-21 NOTE — Anesthesia Preprocedure Evaluation (Addendum)
Anesthesia Evaluation  Patient identified by MRN, date of birth, ID band Patient unresponsive    Reviewed: Allergy & Precautions, NPO status , Patient's Chart, lab work & pertinent test results, Unable to perform ROS - Chart review only  Airway Mallampati: Intubated       Dental   Pulmonary sleep apnea and Continuous Positive Airway Pressure Ventilation ,    breath sounds clear to auscultation       Cardiovascular hypertension, Pt. on medications  Rhythm:Regular Rate:Normal     Neuro/Psych negative neurological ROS  negative psych ROS   GI/Hepatic negative GI ROS, Neg liver ROS,   Endo/Other  diabetes, Type 2, Oral Hypoglycemic Agents  Renal/GU negative Renal ROS  negative genitourinary   Musculoskeletal negative musculoskeletal ROS (+)   Abdominal   Peds negative pediatric ROS (+)  Hematology negative hematology ROS (+)   Anesthesia Other Findings   Reproductive/Obstetrics negative OB ROS                            Lab Results  Component Value Date   WBC 10.6* 06/21/2015   HGB 12.6* 06/21/2015   HCT 42.0 06/21/2015   MCV 94.2 06/21/2015   PLT 249 06/21/2015   Lab Results  Component Value Date   CREATININE 1.28* 06/21/2015   BUN 52* 06/21/2015   NA 149* 06/21/2015   K 3.7 06/21/2015   CL 98* 06/21/2015   CO2 44* 06/21/2015   Lab Results  Component Value Date   INR 1.35 06/21/2015   EKG: normal sinus rhythm, 1st degree AV block.  Anesthesia Physical Anesthesia Plan  ASA: IV  Anesthesia Plan: General   Post-op Pain Management:    Induction: Inhalational  Airway Management Planned: Oral ETT  Additional Equipment:   Intra-op Plan:   Post-operative Plan: Post-operative intubation/ventilation  Informed Consent: I have reviewed the patients History and Physical, chart, labs and discussed the procedure including the risks, benefits and alternatives for the proposed  anesthesia with the patient or authorized representative who has indicated his/her understanding and acceptance.   History available from chart only  Plan Discussed with: CRNA  Anesthesia Plan Comments:         Anesthesia Quick Evaluation

## 2015-06-21 NOTE — Progress Notes (Addendum)
Per verbal order by Dr Tyson AliasFeinstein, increase Peep to 10 and do not wean it at this time for aeration of lung. RT will continue to monitor.

## 2015-06-21 NOTE — H&P (Cosign Needed)
Reason for Consult: Respiratory failure, ventilator dependence  HPI:  Derrick [name] is an 34 y.o. male who was admitted on 06/12/15 for hypercarbic respiratory failure and AMS. The patient has a history of morbid obesity, OSA and OHV. The patient was noted to be hypercarbic and with respiratory acidosis. Subsequently the patient developed hypoxemia as well. Patient was transferred to the ICU and BiPAP was started. F/U ABG post BiPAP showed no evidence of improvement and mental status continued to deteriorate so the patient was intubated on 11/13. The patient continued to be ventilator dependent, with worsening of pulmonary edema. Ventilator weaning was unsuccessful. ENT was therefore consulted for tracheostomy tube placement.  Past Medical History  Diagnosis Date  . Hypertension   . Reflux   . Diabetes mellitus without complication ([place])     History reviewed. No pertinent past surgical history.  History reviewed. No pertinent family history.  Social History:  reports that he has never smoked. He has never used smokeless tobacco. He reports that he does not drink alcohol or use illicit drugs.  Allergies:  Allergies  Allergen Reactions  . Vancomycin Other (See Comments)  . Zosyn [Piperacillin Sod-Tazobactam So] Other (See Comments)    Prior to Admission medications   Medication Sig Start Date End Date Taking? Authorizing Provider  albuterol (PROVENTIL HFA;VENTOLIN HFA) 108 (90 BASE) MCG/ACT inhaler Inhale 2 puffs into the lungs every 2 (two) hours as needed for wheezing or shortness of breath (cough). 06/19/14  Yes Riki Altes, MD  allopurinol (ZYLOPRIM) 300 MG tablet Take 300 mg by mouth daily.   Yes Historical Provider, MD  aspirin 81 MG tablet Take 81 mg by mouth daily.   Yes Historical Provider, MD  furosemide (LASIX) 40 MG tablet Take 40 mg by mouth daily.   Yes Historical Provider, MD  glimepiride (AMARYL) 4 MG tablet Take 4 mg by mouth daily with breakfast.   Yes  Historical Provider, MD  lisinopril-hydrochlorothiazide (PRINZIDE,ZESTORETIC) 20-25 MG per tablet Take 1 tablet by mouth daily.   Yes Historical Provider, MD  loratadine (CLARITIN) 10 MG tablet Take 10 mg by mouth daily.   Yes Historical Provider, MD    Medications:  I have reviewed the patient's current medications. Scheduled: . acetylcysteine  3 mL Nebulization BID  . albuterol  2.5 mg Nebulization BID  . alteplase  2 mg Intracatheter Once  . cefTAZidime (FORTAZ)  IV  2 g Intravenous 3 times per day  . chlorhexidine  15 mL Mouth Rinse BID  . feeding supplement (VITAL HIGH PROTEIN)  1,000 mL Per Tube Q24H  . free water  100 mL Per Tube 3 times per day  . furosemide  40 mg Intravenous Q8H  . heparin subcutaneous  5,000 Units Subcutaneous 3 times per day  . insulin aspart  0-20 Units Subcutaneous 6 times per day  . insulin glargine  25 Units Subcutaneous QHS  . linezolid (ZYVOX) IV  600 mg Intravenous Q12H  . loratadine  10 mg Oral Daily  . pantoprazole sodium  40 mg Per Tube Q24H  . polyethylene glycol  17 g Oral Daily    Results for orders placed or performed during the hospital encounter of 06/12/15 (from the past 48 hour(s))  Glucose, capillary     Status: Abnormal   Collection Time: 06/19/15  8:30 PM  Result Value Ref Range   Glucose-Capillary 255 (H) 65 - 99 mg/dL   Comment 1 Notify RN   Glucose, capillary     Status: Abnormal  Collection Time: 06/19/15 10:01 PM  Result Value Ref Range   Glucose-Capillary 236 (H) 65 - 99 mg/dL   Comment 1 Notify RN   Basic metabolic panel     Status: Abnormal   Collection Time: 06/20/15  4:19 AM  Result Value Ref Range   Sodium 149 (H) 135 - 145 mmol/L   Potassium 3.7 3.5 - 5.1 mmol/L   Chloride 96 (L) 101 - 111 mmol/L   CO2 45 (H) 22 - 32 mmol/L   Glucose, Bld 281 (H) 65 - 99 mg/dL   BUN 54 (H) 6 - 20 mg/dL   Creatinine, Ser 1.30 (H) 0.61 - 1.24 mg/dL   Calcium 9.7 8.9 - 10.3 mg/dL   GFR calc non Af Amer >60 >60 mL/min   GFR  calc Af Amer >60 >60 mL/min    Comment: (NOTE) The eGFR has been calculated using the CKD EPI equation. This calculation has not been validated in all clinical situations. eGFR's persistently <60 mL/min signify possible Chronic Kidney Disease.    Anion gap 8 5 - 15  CBC     Status: Abnormal   Collection Time: 06/20/15  4:19 AM  Result Value Ref Range   WBC 10.9 (H) 4.0 - 10.5 K/uL   RBC 4.21 (L) 4.22 - 5.81 MIL/uL   Hemoglobin 11.8 (L) 13.0 - 17.0 g/dL   HCT 40.2 39.0 - 52.0 %   MCV 95.5 78.0 - 100.0 fL   MCH 28.0 26.0 - 34.0 pg   MCHC 29.4 (L) 30.0 - 36.0 g/dL   RDW 14.7 11.5 - 15.5 %   Platelets 241 150 - 400 K/uL  Glucose, capillary     Status: Abnormal   Collection Time: 06/20/15  7:49 AM  Result Value Ref Range   Glucose-Capillary 259 (H) 65 - 99 mg/dL  Glucose, capillary     Status: Abnormal   Collection Time: 06/20/15 12:16 PM  Result Value Ref Range   Glucose-Capillary 301 (H) 65 - 99 mg/dL  Glucose, capillary     Status: Abnormal   Collection Time: 06/20/15  3:58 PM  Result Value Ref Range   Glucose-Capillary 290 (H) 65 - 99 mg/dL  Glucose, capillary     Status: Abnormal   Collection Time: 06/20/15  8:22 PM  Result Value Ref Range   Glucose-Capillary 293 (H) 65 - 99 mg/dL   Comment 1 Notify RN   Culture, blood (routine x 2)     Status: None (Preliminary result)   Collection Time: 06/20/15  9:44 PM  Result Value Ref Range   Specimen Description BLOOD LEFT ANTECUBITAL    Special Requests BOTTLES DRAWN AEROBIC AND ANAEROBIC 10CC    Culture NO GROWTH < 12 HOURS    Report Status PENDING   Urinalysis, Routine w reflex microscopic (not at [facility])     Status: Abnormal   Collection Time: 06/20/15  9:45 PM  Result Value Ref Range   Color, Urine YELLOW YELLOW   APPearance CLOUDY (A) CLEAR   Specific Gravity, Urine 1.030 1.005 - 1.030   pH 7.5 5.0 - 8.0   Glucose, UA NEGATIVE NEGATIVE mg/dL   Hgb urine dipstick MODERATE (A) NEGATIVE   Bilirubin Urine NEGATIVE NEGATIVE    Ketones, ur NEGATIVE NEGATIVE mg/dL   Protein, ur >300 (A) NEGATIVE mg/dL   Nitrite NEGATIVE NEGATIVE   Leukocytes, UA TRACE (A) NEGATIVE  Culture, Urine     Status: None (Preliminary result)   Collection Time: 06/20/15  9:45 PM  Result Value Ref  Range   Specimen Description URINE, CATHETERIZED    Special Requests NONE    Culture CULTURE REINCUBATED FOR BETTER GROWTH    Report Status PENDING   Urine microscopic-add on     Status: Abnormal   Collection Time: 06/20/15  9:45 PM  Result Value Ref Range   Squamous Epithelial / LPF 0-5 (A) NONE SEEN    Comment: Please note change in reference range.   WBC, UA 0-5 0 - 5 WBC/hpf    Comment: Please note change in reference range.   RBC / HPF 6-30 0 - 5 RBC/hpf    Comment: Please note change in reference range.   Bacteria, UA FEW (A) NONE SEEN    Comment: Please note change in reference range.   Casts HYALINE CASTS (A) NEGATIVE  Culture, respiratory (NON-Expectorated)     Status: None (Preliminary result)   Collection Time: 06/20/15  9:48 PM  Result Value Ref Range   Specimen Description TRACHEAL ASPIRATE    Special Requests NONE    Gram Stain      MODERATE WBC PRESENT, PREDOMINANTLY PMN NO SQUAMOUS EPITHELIAL CELLS SEEN RARE GRAM POSITIVE COCCI IN PAIRS IN CLUSTERS RARE GRAM NEGATIVE RODS RARE GRAM POSITIVE RODS    Culture      Culture reincubated for better growth Performed at [facility]    Report Status PENDING   Culture, blood (routine x 2)     Status: None (Preliminary result)   Collection Time: 06/20/15  9:49 PM  Result Value Ref Range   Specimen Description BLOOD RIGHT HAND    Special Requests BOTTLES DRAWN AEROBIC ONLY 5CC    Culture NO GROWTH < 24 HOURS    Report Status PENDING   Glucose, capillary     Status: Abnormal   Collection Time: 06/21/15 12:05 AM  Result Value Ref Range   Glucose-Capillary 343 (H) 65 - 99 mg/dL  Basic metabolic panel     Status: Abnormal   Collection Time: 06/21/15  4:23 AM   Result Value Ref Range   Sodium 149 (H) 135 - 145 mmol/L   Potassium 3.7 3.5 - 5.1 mmol/L   Chloride 98 (L) 101 - 111 mmol/L   CO2 44 (H) 22 - 32 mmol/L   Glucose, Bld 381 (H) 65 - 99 mg/dL   BUN 52 (H) 6 - 20 mg/dL   Creatinine, Ser 1.28 (H) 0.61 - 1.24 mg/dL   Calcium 9.6 8.9 - 10.3 mg/dL   GFR calc non Af Amer >60 >60 mL/min   GFR calc Af Amer >60 >60 mL/min    Comment: (NOTE) The eGFR has been calculated using the CKD EPI equation. This calculation has not been validated in all clinical situations. eGFR's persistently <60 mL/min signify possible Chronic Kidney Disease.    Anion gap 7 5 - 15  CBC     Status: Abnormal   Collection Time: 06/21/15  4:23 AM  Result Value Ref Range   WBC 10.6 (H) 4.0 - 10.5 K/uL   RBC 4.46 4.22 - 5.81 MIL/uL   Hemoglobin 12.6 (L) 13.0 - 17.0 g/dL   HCT 42.0 39.0 - 52.0 %   MCV 94.2 78.0 - 100.0 fL   MCH 28.3 26.0 - 34.0 pg   MCHC 30.0 30.0 - 36.0 g/dL   RDW 14.3 11.5 - 15.5 %   Platelets 249 150 - 400 K/uL  Glucose, capillary     Status: Abnormal   Collection Time: 06/21/15  4:25 AM  Result Value Ref Range  Glucose-Capillary 327 (H) 65 - 99 mg/dL   Comment 1 Notify RN   Glucose, capillary     Status: Abnormal   Collection Time: 06/21/15  7:57 AM  Result Value Ref Range   Glucose-Capillary 346 (H) 65 - 99 mg/dL   Comment 1 Notify RN   Glucose, capillary     Status: Abnormal   Collection Time: 06/21/15 11:51 AM  Result Value Ref Range   Glucose-Capillary 313 (H) 65 - 99 mg/dL   Comment 1 Notify RN   Protime-INR     Status: Abnormal   Collection Time: 06/21/15 12:11 PM  Result Value Ref Range   Prothrombin Time 16.8 (H) 11.6 - 15.2 seconds   INR 1.35 0.00 - 1.49  Culture, bal-quantitative     Status: None (Preliminary result)   Collection Time: 06/21/15 12:48 PM  Result Value Ref Range   Specimen Description BRONCHIAL ALVEOLAR LAVAGE    Special Requests NONE    Gram Stain PENDING    Culture PENDING    Report Status PENDING    Glucose, capillary     Status: Abnormal   Collection Time: 06/21/15  3:17 PM  Result Value Ref Range   Glucose-Capillary 266 (H) 65 - 99 mg/dL    Dg Chest Port 1 View  06/21/2015  CLINICAL DATA:  Bronchoscopy EXAM: PORTABLE CHEST 1 VIEW COMPARISON:  06/21/2015 FINDINGS: The heart remains markedly enlarged. Endotracheal and NG tubes are stable. Right jugular central venous catheter is stable. Low lung volumes with dense opacity at the right base is stable. There is no pneumothorax. Atelectasis at the left base is stable. IMPRESSION: No pneumothorax after bronchoscopy. Stable opacity at the right lung base and minimal atelectasis at the left base. Electronically Signed   By: Marybelle Killings M.D.   On: 06/21/2015 13:28   Dg Chest Port 1 View  06/21/2015  CLINICAL DATA:  Respiratory failure EXAM: PORTABLE CHEST 1 VIEW COMPARISON:  Yesterday FINDINGS: Endotracheal tube tip at the clavicular heads. Orogastric tube tip reaches the stomach. Right IJ central line, tip at the SVC level. Stable cardiopericardial enlargement with vascular pedicle widening. Persistent low lung volumes with streaky basilar opacities and small right pleural effusion. Pulmonary venous congestion accentuated by volumes. No pneumothorax. IMPRESSION: 1. Unchanged positioning of tubes and central line. 2. Low volumes with bibasilar atelectasis and small effusions. Electronically Signed   By: Monte Fantasia M.D.   On: 06/21/2015 06:57   Dg Chest Port 1 View  06/20/2015  CLINICAL DATA:  Patient with acute respiratory failure. EXAM: PORTABLE CHEST 1 VIEW COMPARISON:  Chest radiograph 06/19/2015. FINDINGS: ET tube terminates in the mid trachea. Right IJ central venous catheter tip projects over the superior vena cava. Enteric tube courses inferior to the diaphragm. Multiple monitoring leads overlie the patient. Stable cardiomegaly. Low lung volumes. Unchanged heterogeneous opacities left mid lower lung, favored to represent atelectasis.  Small bilateral pleural effusions. IMPRESSION: Stable support apparatus. Cardiomegaly. Small bilateral pleural effusions, low lung volumes and basilar atelectasis. Electronically Signed   By: Lovey Newcomer M.D.   On: 06/20/2015 09:28    Blood pressure 156/108, pulse 101, temperature 102.9 F (39.4 C), temperature source Oral, resp. rate 18, height _0  (1.778 m), weight 195 kg (429 lb 14.4 oz), SpO2 95 %.  PHYSICAL EXAMINATION: General: Morbidly obese male. On vent. Neuro:Sedated. Neck: Obese. Laryngeal landmarks not palpable.  Oral: ET tube in place. Lungs: Rhonchorous breath sounds over the anterior chest wall bilaterally.  Musculoskeletal: Trace Bilateral LE edema.  Assessment/Plan: Morbid obesity, respiratory failure, ventilator dependence. Planned open tracheostomy in the operating room. Informed consent obtained.  Jahmia Berrett,SUI W 06/21/2015, 6:33 PM

## 2015-06-21 NOTE — Progress Notes (Signed)
Pt became agitated around 2230 grabbing RN, attempting to get up, and disconnecting ventilator circuit.  Dr Vassie LollAlva notified. Precedex increased to max dose, fentanyl gtt started, versed push given. Pt gradually with a decreased in agitation over the night. Attempted to call mother, both numbers in the chart are disconnected. Unable to get an answer at wife's number. Wife did call at [phone] and update given, Wife is going to update mother.

## 2015-06-21 NOTE — Progress Notes (Signed)
Pt to OR with CRNA manually ventilating. RT will place pt back on vent upon return. RT will continue to monitor.

## 2015-06-21 NOTE — Progress Notes (Signed)
PT Cancellation Note  Patient Details Name: Derrick HansenReginald A Phillis MRN: [id number] DOB: [birth year]   Cancelled Treatment:    Reason Eval/Treat Not Completed: Patient not medically ready Holding PT evaluation today as pt not medically ready/level of consciousness. Pt with delirium over night requiring increased sedation today. Noted to have collapsed RLL s/p bronchoscopy. PEEP increased to 10. Will follow up next available time.   Blake DivineShauna A Melisa Donofrio 06/21/2015, 3:04 PM Mylo RedShauna Roderick Calo, PT, DPT [phone]

## 2015-06-21 NOTE — Progress Notes (Signed)
Nutrition Follow-up  DOCUMENTATION CODES:   Morbid obesity  INTERVENTION:    Continue Vital High Protein at goal rate of 90 ml/h (2160 ml per day) to provide 2160 kcals, 189 gm protein, 1806 ml free water daily.  NUTRITION DIAGNOSIS:   Inadequate oral intake related to inability to eat as evidenced by NPO status.  Ongoing  GOAL:   Provide needs based on ASPEN/SCCM guidelines  Met  MONITOR:   Labs, Weight trends, TF tolerance, Vent status, I & O's  REASON FOR ASSESSMENT:   Consult Enteral/tube feeding initiation and management  ASSESSMENT:   34 y.o. male with a history of Morbid Obesity, DM2, HTN, OSA who presented to the ED with complaints of colicky RUQ ABD Pain x 2-3 days. He denies any Nausea and Vomiting or Diarrhea, He reports his last BM was 3 days ago. He was evaluated in the ED and was found to have hypoxemia with O2sats in the 80's. He was also found to have hypercarbia with a pCO2 = 60.8. Due to weight constraints of the CT scanner a CTA of the Chest And ABD was not able to be performed and he was transferred for further workup.  Labs reviewed. Sodium elevated. Discussed patient in ICU rounds and with RN today. No trouble tolerating TF. TF to be held for trach placement in OR Tuesday. Patient is currently receiving Vital High Protein via OGT at 90 ml/h (2160 ml/day) to provide 2160 kcals, 189 gm protein, 1806 ml free water daily.   Diet Order:  Diet NPO time specified  Skin:  Reviewed, no issues  Last BM:  11/20  Height:   Ht Readings from Last 1 Encounters:  06/13/15 $RemoveB'5\' 10"'pakImieZ$  (1.778 m)    Weight:   Wt Readings from Last 1 Encounters:  06/21/15 429 lb 14.4 oz (195 kg)    Ideal Body Weight:  75.5 kg  BMI:  Body mass index is 61.68 kg/(m^2).  Estimated Nutritional Needs:   Kcal:  2170-2762  Protein:  189 gm  Fluid:  2.2-2.7 L  EDUCATION NEEDS:   No education needs identified at this time  Molli Barrows, [place], [place], [place] Pager  [phone] After Hours Pager [phone]

## 2015-06-21 NOTE — Procedures (Signed)
Bronchoscopy Procedure Note Derrick [name] [id number] [birth year]  Procedure: Bronchoscopy Indications: Diagnostic evaluation of the airways, Obtain specimens for culture and/or other diagnostic studies and Remove secretions  Procedure Details Consent: Risks of procedure as well as the alternatives and risks of each were explained to the (patient/caregiver).  Consent for procedure obtained. Time Out: Verified patient identification, verified procedure, site/side was marked, verified correct patient position, special equipment/implants available, medications/allergies/relevent history reviewed, required imaging and test results available.  Performed  In preparation for procedure, patient was given 100% FiO2 and bronchoscope lubricated. Sedation: Etomidate  Airway entered and the following bronchi were examined: RUL, RML, RLL, LUL, LLL and Bronchi.   Procedures performed: Bal rml Bronchoscope removed.  , Patient placed back on 100% FiO2 at conclusion of procedure.    Evaluation Hemodynamic Status: BP stable throughout; O2 sats: stable throughout Patient's Current Condition: stable Specimens:  Sent purulent fluid Complications: No apparent complications Patient did tolerate procedure well.   Nelda BucksFEINSTEIN,Daine Croker J. 06/21/2015   1. Impressive thin grey secretions with complete collapse rt main and all lobes rt and partial 50% left main, all cleared eventually with lavage 2. Mucomysts 3 cc 20% to rll 3. Mild suction trauma and edema diffuse rt greater left 4. BAL thin grey pus rml  Mcarthur Rossettianiel J. Tyson AliasFeinstein, MD, FACP Pgr: [phone] [facility]

## 2015-06-21 NOTE — Progress Notes (Signed)
Inpatient Diabetes Program Recommendations  AACE/ADA: New Consensus Statement on Inpatient Glycemic Control (2015)  Target Ranges:  Prepandial:   less than 140 mg/dL      Peak postprandial:   less than 180 mg/dL (1-2 hours)      Critically ill patients:  140 - 180 mg/dL  Results for Derrick [name], Derrick [name] (MRN [id number]) as of 06/21/2015 09:04  Ref. Range 06/20/2015 07:49 06/20/2015 12:16 06/20/2015 15:58 06/20/2015 20:22 06/21/2015 00:05 06/21/2015 04:25 06/21/2015 07:57  Glucose-Capillary Latest Ref Range: 65-99 mg/dL 865259 (H) 784301 (H) 696290 (H) 293 (H) 343 (H) 327 (H) 346 (H)   Results for Derrick [name], Derrick [name] (MRN [id number]) as of 06/21/2015 09:04  Ref. Range 06/13/2015 05:05  Hemoglobin A1C Latest Ref Range: 4.8-5.6 % 10.1 (H)   Review of Glycemic Control  Diabetes history: DM2 Outpatient Diabetes medications: Amaryl 4 mg QAM, Metformin 1000 mg BID Current orders for Inpatient glycemic control: Lantus 25 units QHS, Novolog 0-20 units Q4H  Inpatient Diabetes Program Recommendations: Insulin - Basal: Noted Lantus was increased from 15 units QHS to 25 units QHS today and will start tonight. Correction (SSI): If glucose continues to be elevated, please consider changing patient to ICU Hyperglycemia protocol so that patient can transition between SQ to IV insulin if needed. Insulin - Meal Coverage: Please consider ordering Novolog 6 units Q4H for tube feeding coverage (in additon to Novolog correction scale).   Thanks, Orlando PennerMarie Finnean Cerami, RN, MSN, CDE Diabetes Coordinator Inpatient Diabetes Program [phone] (Team Pager from 8am to 5pm) [phone] ([facility] office) [phone] [facility] office) [phone] [facility] office)

## 2015-06-21 NOTE — Progress Notes (Signed)
ANTIBIOTIC CONSULT NOTE - INITIAL  Pharmacy Consult for Ceftaz/Linezolid Indication: rule out pneumonia  Allergies  Allergen Reactions  . Vancomycin Other (See Comments)  . Zosyn [Piperacillin Sod-Tazobactam So] Other (See Comments)    Patient Measurements: Height:  (177.8 cm) Weight: (!) 429 lb 14.4 oz (195 kg) IBW/kg (Calculated) : 73  Vital Signs: Temp: 102.1 F (38.9 C) (11/21 1154) Temp Source: Oral (11/21 1154) BP: 130/80 mmHg (11/21 1300) Pulse Rate: 83 (11/21 1300) Intake/Output from previous day: 11/20 0701 - 11/21 0700 In: 3677.9 [I.V.:1167.9; NG/GT:2510] Out: 3230 [Urine:3230] Intake/Output from this shift: Total I/O In: 990.3 [I.V.:460.3; NG/GT:180; IV Piggyback:350] Out: 750 [Urine:750]  Labs:  Recent Labs  06/19/15 0500 06/20/15 0419 06/21/15 0423  WBC 11.2* 10.9* 10.6*  HGB 11.5* 11.8* 12.6*  PLT 232 241 249  CREATININE 1.42* 1.30* 1.28*   Estimated Creatinine Clearance: 141.4 mL/min (by C-G formula based on Cr of 1.28). No results for input(s): VANCOTROUGH, VANCOPEAK, VANCORANDOM, GENTTROUGH, GENTPEAK, GENTRANDOM, TOBRATROUGH, TOBRAPEAK, TOBRARND, AMIKACINPEAK, AMIKACINTROU, AMIKACIN in the last 72 hours.   Microbiology: Recent Results (from the past 720 hour(s))  MRSA PCR Screening     Status: None   Collection Time: 06/13/15 12:50 AM  Result Value Ref Range Status   MRSA by PCR NEGATIVE NEGATIVE Final    Comment:        The GeneXpert MRSA Assay (FDA approved for NASAL specimens only), is one component of a comprehensive MRSA colonization surveillance program. It is not intended to diagnose MRSA infection nor to guide or monitor treatment for MRSA infections.   Culture, Urine     Status: None   Collection Time: 06/13/15  2:43 PM  Result Value Ref Range Status   Specimen Description URINE, CATHETERIZED  Final   Special Requests NONE  Final   Culture NO GROWTH 1 DAY  Final   Report Status 06/14/2015 FINAL  Final  Culture,  blood (routine x 2)     Status: None (Preliminary result)   Collection Time: 06/20/15  9:44 PM  Result Value Ref Range Status   Specimen Description BLOOD LEFT ANTECUBITAL  Final   Special Requests BOTTLES DRAWN AEROBIC AND ANAEROBIC 10CC  Final   Culture NO GROWTH < 12 HOURS  Final   Report Status PENDING  Incomplete  Culture, Urine     Status: None (Preliminary result)   Collection Time: 06/20/15  9:45 PM  Result Value Ref Range Status   Specimen Description URINE, CATHETERIZED  Final   Special Requests NONE  Final   Culture CULTURE REINCUBATED FOR BETTER GROWTH  Final   Report Status PENDING  Incomplete  Culture, respiratory (NON-Expectorated)     Status: None (Preliminary result)   Collection Time: 06/20/15  9:48 PM  Result Value Ref Range Status   Specimen Description TRACHEAL ASPIRATE  Final   Special Requests NONE  Final   Gram Stain   Final    MODERATE WBC PRESENT, PREDOMINANTLY PMN NO SQUAMOUS EPITHELIAL CELLS SEEN RARE GRAM POSITIVE COCCI IN PAIRS IN CLUSTERS RARE GRAM NEGATIVE RODS RARE GRAM POSITIVE RODS    Culture   Final    Culture reincubated for better growth Performed at [facility]    Report Status PENDING  Incomplete  Culture, blood (routine x 2)     Status: None (Preliminary result)   Collection Time: 06/20/15  9:49 PM  Result Value Ref Range Status   Specimen Description BLOOD RIGHT HAND  Final   Special Requests BOTTLES DRAWN AEROBIC ONLY 5CC  Final   Culture NO GROWTH < 24 HOURS  Final   Report Status PENDING  Incomplete    Medical History: Past Medical History  Diagnosis Date  . Hypertension   . Reflux   . Diabetes mellitus without complication (HCC)     Medications:  Scheduled:  . acetylcysteine  3 mL Nebulization BID  . albuterol  2.5 mg Nebulization BID  . alteplase  2 mg Intracatheter Once  . cefTAZidime (FORTAZ)  IV  2 g Intravenous 3 times per day  . chlorhexidine  15 mL Mouth Rinse BID  . feeding supplement (VITAL HIGH  PROTEIN)  1,000 mL Per Tube Q24H  . free water  100 mL Per Tube 3 times per day  . furosemide  40 mg Intravenous Q8H  . heparin subcutaneous  5,000 Units Subcutaneous 3 times per day  . insulin aspart  0-20 Units Subcutaneous 6 times per day  . insulin glargine  25 Units Subcutaneous QHS  . linezolid (ZYVOX) IV  600 mg Intravenous Q12H  . loratadine  10 mg Oral Daily  . pantoprazole sodium  40 mg Per Tube Q24H  . polyethylene glycol  17 g Oral Daily   Assessment: 34 YO male w/ acute hypoxic, hypercapnic respiratory failure 2/2 CHF exacerbation requiring intubation on 11/13.   Started spiking fevers in the 102's on 11/20, suspected VAP. Empiric antibiotics started 11/20 with levofloxacin, broadened today to ceftazidime and linezolid (unspecified allergies to vanc/zosyn). Bronch done today w/ culture sent. WBC slightly increased to 10.6. Tmax 102.8.  SCr elevated to 1.28 w/ normalized CrCl ~84 ml/min.  Ceftaz 11/21 >> Linezolid 11/21 >> Levofloxacin 11/20 >> 11/21  11/20 Blood cx: NGTD 11/20 UCx: pending 11/20 Resp cx: pending 11/21 BAL cx: sent   Goal of Therapy:  Infection resolution  Plan:  Linezolid 600 mg IV q12h Ceftazidime 2 g IV q8h  Monitor cultures, renal function, clinical progression  Greggory Stallionristy Reyes, PharmD Clinical Pharmacy Resident Pager # [phone] 06/21/2015 2:25 PM

## 2015-06-21 NOTE — Progress Notes (Addendum)
PULMONARY / CRITICAL CARE MEDICINE   Name: Derrick [name] MRN: [id number] DOB: Nov 20, 1980    ADMISSION DATE:  06/12/2015 CONSULTATION DATE:  06/13/2015  REFERRING MD :  TRH  CHIEF COMPLAINT:  Hypercarbic respiratory failure and AMS  INITIAL PRESENTATION: 34 year old male with morbid obesity, OSA and OHV who presents to PCCM with AMS.  ABG was ordered and patient was noted to be hypercarbic and with respiratory acidosis.  Subsequently the patient developed hypoxemia as well.  Patient was transferred to the ICU and BiPAP was started.  F/U ABG post BiPAP showed no evidence of improvement and mental status continued to deteriorate so decision was made to intubate.  STUDIES:  CXR 11/13- with cardiomegally and pulmonary edema. Venous doppler 11/13- without DVT, superficial thrombosis, or Baker's cyst CXR 11/14- Hypoventilation with bibasilar atelectasis.  TTE 11/14- Mod concentric LV hypertrophy, EF 55-60%, grade 2 diastolic dysfunction, can't assess PV.  CXR 11/15- Slight interval deterioration in the pulmonary interstitium with increased pulmonary vascular prominence. Developing left lower lobe atelectasis is suspected.  CXR 11/17- Worsening pulmonary edema.  CXR 11/18- Slight improvement in pulmonary vascular congestion. Persistent bibasilar atelectasis or pneumonia CXR 11/19- Persistent mild lung base atelectasis and small effusions without pulmonary edema. CXR 11/21- Low lung vol, bibasilar atelectasis and small effusions  SIGNIFICANT EVENTS: 11/13 intubation for hypercarbic/hypoxic respiratory failure. 11/14- transitioned from Lovenox gtt to heparin gtt 11/15- removed PA catheter and replaced with CVL, decrease Lasix gtt  11/17- transitioned to intermittent Lasix 11/20- T 102.5, blood cx and Levaquin    SUBJECTIVE:   Patient became agitated, almost disconnected ventilator circuit. Precedex increased, Fentanyl gtt started and PRN Versed added. Attempted to wean this AM,  desatted quickly.     VITAL SIGNS: Temp:  [99.3 F (37.4 C)-102.8 F (39.3 C)] 102.4 F (39.1 C) (11/21 0600) Pulse Rate:  [83-117] 90 (11/21 0600) Resp:  [13-28] 18 (11/21 0600) BP: (131-197)/(75-135) 160/96 mmHg (11/21 0600) SpO2:  [88 %-97 %] 95 % (11/21 0600) FiO2 (%):  [50 %-70 %] 50 % (11/21 0600) Weight:  [195 kg (429 lb 14.4 oz)] 195 kg (429 lb 14.4 oz) (11/21 0430) HEMODYNAMICS:   VENTILATOR SETTINGS: Vent Mode:  [-] PRVC FiO2 (%):  [50 %-70 %] 50 % Set Rate:  [14 bmp] 14 bmp Vt Set:  [580 mL] 580 mL PEEP:  [5 cmH20] 5 cmH20 Plateau Pressure:  [26 cmH20-35 cmH20] 30 cmH20 INTAKE / OUTPUT:  Intake/Output Summary (Last 24 hours) at 06/21/15 0648 Last data filed at 06/21/15 0600  Gross per 24 hour  Intake 3625.91 ml  Output   3230 ml  Net 395.91 ml    PHYSICAL EXAMINATION: General:  Obese male alert. Alert  Neuro:  Following commands. Moves all extremities.  Cardiovascular:  Distant heart sounds.  No m/r/g noted.  Lungs:  Rhonchorous breath sounds over the anterior chest wall bilaterally.  Abdomen:  Soft, obese, ND and +BS. Musculoskeletal:  Trace Bilateral LE edema.  Skin:  Intact, lichenification over the LE bilaterally  LABS:  CBC  Recent Labs Lab 06/19/15 0500 06/20/15 0419 06/21/15 0423  WBC 11.2* 10.9* 10.6*  HGB 11.5* 11.8* 12.6*  HCT 39.9 40.2 42.0  PLT 232 241 249   Coag's No results for input(s): APTT, INR in the last 168 hours. BMET  Recent Labs Lab 06/19/15 0500 06/20/15 0419 06/21/15 0423  NA 147* 149* 149*  K 3.8 3.7 3.7  CL 93* 96* 98*  CO2 47* 45* 44*  BUN 51* 54* 52*  CREATININE 1.42* 1.30* 1.28*  GLUCOSE 245* 281* 381*   Electrolytes  Recent Labs Lab 06/14/15 0835  06/18/15 0450 06/19/15 0500 06/20/15 0419 06/21/15 0423  CALCIUM 8.8*  < > 9.4 9.5 9.7 9.6  MG 1.8  --  2.1  --   --   --   PHOS 2.2*  --   --   --   --   --   < > = values in this interval not displayed. Sepsis Markers No results for input(s):  LATICACIDVEN, PROCALCITON, O2SATVEN in the last 168 hours. ABG  Recent Labs Lab 06/15/15 0800 06/15/15 0950 06/16/15 0358  PHART 7.283* 7.405 7.406  PCO2ART 85.7* 63.6* 68.3*  PO2ART 80.3 68.2* 71.6*   Liver Enzymes  Recent Labs Lab 06/16/15 0400  AST 22  ALT 20  ALKPHOS 117  BILITOT 1.0  ALBUMIN 3.0*   Cardiac Enzymes No results for input(s): TROPONINI, PROBNP in the last 168 hours. Glucose  Recent Labs Lab 06/20/15 0749 06/20/15 1216 06/20/15 1558 06/20/15 2022 06/21/15 0005 06/21/15 0425  GLUCAP 259* 301* 290* 293* 343* 327*    Imaging No results found.   ASSESSMENT / PLAN:  PULMONARY OETT 11/13>>> A: VDRF, hypercarbic, due to morbid obesity, OSA, OHV and CHF. PE less likely given history COLLAPSE RLL P:   - Full vent support. - VAP - Diuresis as b/p and scr allow  - ConSIDER TRACH -BRONCH FOR RLL COLLapse  CONSIDERATION -add peep,. mucomysts -add chest pt  CARDIOVASCULAR R IJ PAC 11/13>>>11/5, replaced on 11/15 with CVL A: CHF exacerbation due to morbid obesity and likely pulmonary edema and pulmonary HTN. Troponins negative Net +440cc overnight P:  - Lasix 40 q8hr to even balance goals - Holding home lisinopril-HCTZ for now  -free water with addition d5w  RENAL A:  Hyponatremia resolved  Hypokalemia resolved  Hypomagnesemia resolved  Hpernatremia  P:   - add d5w -chem in am  -lasix to even balance  GASTROINTESTINAL A:  Nutrition. P:   - Tube Feeds - Protonix daily  HEMATOLOGIC A:  Anemia  DVT ppx P:  - SQ heparin - CBC in AM. - Transfuse per ICU protocol -need s coags  INFECTIOUS A: Fevers overnight, leukocytosis: PNA, collapse - concern HCAP, r/o vap P:   Blood cx 11/20> Urine cx 11/20> NGTD   Levaquin  11/20>>  for bronch Dc levofloxacin Add ceftaz, linazolid  ENDOCRINE A:  DM.   A1c this hospitalization was 10.1 Hyperglycemia  P:   -  resistant SSI  -  Novolog to 6 units q4hrs while on tube feeds -  Increased Lantus from 15u to 25u qHS - CBGs.  NEUROLOGIC A:  AMS due to hypercarbia P:   RASS goal: 0 Precedex gtt, fentanyl gtt, PRN versed Derrick AcreWUA    Derrick S. Dorsey, MD [facility] Resident  06/21/2015, 6:48 AM   STAFF NOTE: Cindi CarbonI, Mckenna Gamm, MD FACP have personally reviewed patient's available data, including medical history, events of note, physical examination and test results as part of my evaluation. I have discussed with resident/NP and other care providers such as pharmacist, RN and RRT. In addition, I personally evaluated patient and elicited key findings of: obese, reduced rt base worsen, pcxr with collapse rt lower lobe, fevers, bronch planned, add hcap / vap abx, peep  Increase, add chest pt, add mucomysts, consider trach, will d/w pt, get coags, dc levofloxacin, continued TF, lantus increase The patient is critically ill with multiple organ systems failure and requires high complexity  decision making for assessment and support, frequent evaluation and titration of therapies, application of advanced monitoring technologies and extensive interpretation of multiple databases.   Critical Care Time devoted to patient care services described in this note is 40  Minutes. This time reflects time of care of this signee: Rory Percy, MD FACP. This critical care time does not reflect procedure time, or teaching time or supervisory time of PA/NP/Med student/Med Resident etc but could involve care discussion time. Rest per NP/medical resident whose note is outlined above and that I agree with   Mcarthur Rossetti. Tyson Alias, MD, FACP Pgr: [phone] [facility] 06/21/2015 10:57 AM

## 2015-06-22 ENCOUNTER — Encounter (HOSPITAL_COMMUNITY): Payer: Self-pay | Admitting: Otolaryngology

## 2015-06-22 ENCOUNTER — Inpatient Hospital Stay (HOSPITAL_COMMUNITY): Payer: 59

## 2015-06-22 LAB — GLUCOSE, CAPILLARY
GLUCOSE-CAPILLARY: 166 mg/dL — AB (ref 65–99)
GLUCOSE-CAPILLARY: 232 mg/dL — AB (ref 65–99)
GLUCOSE-CAPILLARY: 273 mg/dL — AB (ref 65–99)
Glucose-Capillary: 156 mg/dL — ABNORMAL HIGH (ref 65–99)
Glucose-Capillary: 190 mg/dL — ABNORMAL HIGH (ref 65–99)
Glucose-Capillary: 207 mg/dL — ABNORMAL HIGH (ref 65–99)

## 2015-06-22 LAB — BASIC METABOLIC PANEL
ANION GAP: 8 (ref 5–15)
Anion gap: 8 (ref 5–15)
BUN: 46 mg/dL — AB (ref 6–20)
BUN: 54 mg/dL — AB (ref 6–20)
CALCIUM: 9.3 mg/dL (ref 8.9–10.3)
CHLORIDE: 106 mmol/L (ref 101–111)
CO2: 42 mmol/L — ABNORMAL HIGH (ref 22–32)
CO2: 38 mmol/L — ABNORMAL HIGH (ref 22–32)
CREATININE: 1.5 mg/dL — AB (ref 0.61–1.24)
Calcium: 8.7 mg/dL — ABNORMAL LOW (ref 8.9–10.3)
Chloride: 105 mmol/L (ref 101–111)
Creatinine, Ser: 1.9 mg/dL — ABNORMAL HIGH (ref 0.61–1.24)
GFR calc Af Amer: >60 mL/min (ref >60)
GFR calc Af Amer: 52 mL/min — ABNORMAL LOW (ref >60)
GFR, EST NON AFRICAN AMERICAN: 60 mL/min — AB (ref >60)
GFR, EST NON AFRICAN AMERICAN: 45 mL/min — AB (ref >60)
GLUCOSE: 259 mg/dL — AB (ref 65–99)
GLUCOSE: 242 mg/dL — AB (ref 65–99)
POTASSIUM: 3.4 mmol/L — AB (ref 3.5–5.1)
Potassium: 3.7 mmol/L (ref 3.5–5.1)
SODIUM: 155 mmol/L — AB (ref 135–145)
Sodium: 152 mmol/L — ABNORMAL HIGH (ref 135–145)

## 2015-06-22 LAB — CBC
HEMATOCRIT: 42.2 % (ref 39.0–52.0)
Hemoglobin: 12.3 g/dL — ABNORMAL LOW (ref 13.0–17.0)
MCH: 28.3 pg (ref 26.0–34.0)
MCHC: 29.1 g/dL — AB (ref 30.0–36.0)
MCV: 97 fL (ref 78.0–100.0)
PLATELETS: 242 10*3/uL (ref 150–400)
RBC: 4.35 MIL/uL (ref 4.22–5.81)
RDW: 14.4 % (ref 11.5–15.5)
WBC: 13.4 10*3/uL — AB (ref 4.0–10.5)

## 2015-06-22 LAB — URINE CULTURE

## 2015-06-22 MED ORDER — RISPERIDONE 1 MG/ML PO SOLN
1.0000 mg | Freq: Two times a day (BID) | ORAL | Status: DC
  Administered 2015-06-22 (×2): 1 mg via ORAL
  Filled 2015-06-22 (×4): qty 1

## 2015-06-22 MED ORDER — METHADONE HCL 10 MG PO TABS
10.0000 mg | ORAL_TABLET | Freq: Four times a day (QID) | ORAL | Status: DC
  Administered 2015-06-22 – 2015-06-23 (×6): 10 mg via ORAL
  Filled 2015-06-22 (×7): qty 1

## 2015-06-22 MED ORDER — PROPOFOL 1000 MG/100ML IV EMUL
5.0000 ug/kg/min | INTRAVENOUS | Status: DC
  Administered 2015-06-22: 25 ug/kg/min via INTRAVENOUS
  Administered 2015-06-22: 5 ug/kg/min via INTRAVENOUS
  Administered 2015-06-22: 25 ug/kg/min via INTRAVENOUS
  Administered 2015-06-22: 30 ug/kg/min via INTRAVENOUS
  Administered 2015-06-22: 35 ug/kg/min via INTRAVENOUS
  Administered 2015-06-22: 10 ug/kg/min via INTRAVENOUS
  Administered 2015-06-23: 45 ug/kg/min via INTRAVENOUS
  Administered 2015-06-23: 20 ug/kg/min via INTRAVENOUS
  Administered 2015-06-23: 45 ug/kg/min via INTRAVENOUS
  Administered 2015-06-23: 30 ug/kg/min via INTRAVENOUS
  Administered 2015-06-23 (×2): 45 ug/kg/min via INTRAVENOUS
  Filled 2015-06-22 (×12): qty 100

## 2015-06-22 MED ORDER — METHADONE HCL 10 MG/ML PO CONC: 10.0000 mg | Freq: Four times a day (QID) | ORAL | Status: DC

## 2015-06-22 MED ORDER — HALOPERIDOL LACTATE 5 MG/ML IJ SOLN
INTRAMUSCULAR | Status: AC
  Administered 2015-06-22: 5 mg via INTRAVENOUS
  Filled 2015-06-22: qty 1

## 2015-06-22 MED ORDER — DEXTROSE 5 % IV SOLN
INTRAVENOUS | Status: DC
  Administered 2015-06-22 – 2015-06-25 (×4): via INTRAVENOUS

## 2015-06-22 MED ORDER — HALOPERIDOL LACTATE 5 MG/ML IJ SOLN
5.0000 mg | Freq: Once | INTRAMUSCULAR | Status: AC
  Administered 2015-06-22: 5 mg via INTRAVENOUS

## 2015-06-22 MED ORDER — POTASSIUM CHLORIDE 20 MEQ/15ML (10%) PO SOLN
40.0000 meq | Freq: Once | ORAL | Status: AC
  Administered 2015-06-22: 40 meq
  Filled 2015-06-22: qty 30

## 2015-06-22 MED ORDER — LORAZEPAM 2 MG/ML PO CONC
2.0000 mg | Freq: Three times a day (TID) | ORAL | Status: DC
  Administered 2015-06-22 (×2): 2 mg via ORAL
  Filled 2015-06-22 (×2): qty 1

## 2015-06-22 MED ORDER — PROPOFOL 1000 MG/100ML IV EMUL
INTRAVENOUS | Status: AC
  Filled 2015-06-22: qty 100

## 2015-06-22 MED FILL — Medication: Qty: 1 | Status: AC

## 2015-06-22 NOTE — Progress Notes (Signed)
Patient pulled self off vent not allowing staff to connect, blocking attempts to reconnect, and swinging at staff. Elink notified and soft wrist restraints ordered and applied. Patient slowly calming down and need for restraints explained to patient. Mother, Randa LynnRosa Reddick, called and informed of restraints and questions answered.

## 2015-06-22 NOTE — Progress Notes (Signed)
Inpatient Diabetes Program Recommendations  AACE/ADA: New Consensus Statement on Inpatient Glycemic Control (2015)  Target Ranges:  Prepandial:   less than 140 mg/dL      Peak postprandial:   less than 180 mg/dL (1-2 hours)      Critically ill patients:  140 - 180 mg/dL  Results for Derrick [name], Derrick [name] (MRN [id number]) as of 06/22/2015 10:47  Ref. Range 06/21/2015 04:25 06/21/2015 07:57 06/21/2015 11:51 06/21/2015 15:17 06/21/2015 19:45 06/21/2015 23:36 06/22/2015 03:57 06/22/2015 08:06  Glucose-Capillary Latest Ref Range: 65-99 mg/dL 409327 (H) 811346 (H) 914313 (H) 266 (H) 241 (H) 273 (H) 232 (H) 166 (H)   Review of Glycemic Control  Diabetes history: DM2 Outpatient Diabetes medications: Amaryl 4 mg QAM, Metformin 1000 mg BID Current orders for Inpatient glycemic control: Lantus 25 units QHS, Novolog 0-20 units Q4H  Inpatient Diabetes Program Recommendations:  Insulin - Meal Coverage: Once tube feeding is restarted, please consider ordering Novolog 6 units Q4H for tube feeding coverage (which would be held if tube feeding is on hold).  Thanks, Orlando PennerMarie Lateef Juncaj, RN, MSN, CDE Diabetes Coordinator Inpatient Diabetes Program [phone] (Team Pager from 8am to 5pm) [phone] ([facility] office) [phone] [facility] office) [phone] [facility] office)

## 2015-06-22 NOTE — Clinical Documentation Improvement (Signed)
Critical Care  Can the diagnosis of anemia be further specified? Please update your documentation within the medical record to reflect your response to this query. Do not document in BPA drop down box; content belongs in progress note.Thank you   Iron deficiency Anemia  Nutritional anemia, including the nutrition or mineral deficits  Chronic Blood Loss Anemia, including the suspected or known cause  Acute Blood Loss Anemia  Anemia of chronic disease, including the associated chronic disease state  Other  Clinically Undetermined  Document any associated diagnoses/conditions.  Supporting Information:  Hgb's stable - running from 12.7 to 11.5  Please exercise your independent, professional judgment when responding. A specific answer is not anticipated or expected.  Thank You,  Shellee MiloEileen T Dajanique Robley RN, BSN Health Information Management [place] [phone]; Cell: [phone]

## 2015-06-22 NOTE — Progress Notes (Signed)
Subjective: No trach issue. Venting well via trach.  Objective: Vital signs in last 24 hours: Temp:  [101.8 F (38.8 C)-102.9 F (39.4 C)] 102 F (38.9 C) (11/22 1200) Pulse Rate:  [40-122] 83 (11/22 1300) Resp:  [14-29] 16 (11/22 1300) BP: (94-212)/(56-129) 106/71 mmHg (11/22 1300) SpO2:  [89 %-100 %] 93 % (11/22 1300) FiO2 (%):  [50 %] 50 % (11/22 1234) Weight:  [198 kg (436 lb 8.2 oz)] 198 kg (436 lb 8.2 oz) (11/22 0408)  PHYSICAL EXAMINATION: General: Morbidly obese male. On vent. Neuro:Sedated. Neck: Obese. Trach in place. Oral: Normal mucosa. Lungs: Rhonchorous breath sounds over the anterior chest wall bilaterally.  Musculoskeletal: Trace Bilateral LE edema.    Recent Labs  06/21/15 0423 06/22/15 0413  WBC 10.6* 13.4*  HGB 12.6* 12.3*  HCT 42.0 42.2  PLT 249 242    Recent Labs  06/21/15 0423 06/22/15 0413  NA 149* 155*  K 3.7 3.7  CL 98* 105  CO2 44* 42*  GLUCOSE 381* 259*  BUN 52* 46*  CREATININE 1.28* 1.50*  CALCIUM 9.6 9.3    Medications:  I have reviewed the patient's current medications. Scheduled: . acetylcysteine  3 mL Nebulization BID  . albuterol  2.5 mg Nebulization BID  . alteplase  2 mg Intracatheter Once  . cefTAZidime (FORTAZ)  IV  2 g Intravenous 3 times per day  . chlorhexidine  15 mL Mouth Rinse BID  . feeding supplement (VITAL HIGH PROTEIN)  1,000 mL Per Tube Q24H  . heparin subcutaneous  5,000 Units Subcutaneous 3 times per day  . insulin aspart  0-20 Units Subcutaneous 6 times per day  . insulin glargine  25 Units Subcutaneous QHS  . linezolid (ZYVOX) IV  600 mg Intravenous Q12H  . loratadine  10 mg Oral Daily  . LORazepam  2 mg Oral 3 times per day  . methadone  10 mg Oral QID  . pantoprazole sodium  40 mg Per Tube Q24H  . polyethylene glycol  17 g Oral Daily  . risperiDONE  1 mg Oral q12n4p   ZOX:[id number]:acetaminophen (TYLENOL) oral liquid 160 mg/5 mL, bisacodyl, fentaNYL (SUBLIMAZE) injection, midazolam, [DISCONTINUED]  ondansetron **OR** ondansetron (ZOFRAN) IV, sennosides  Assessment/Plan: POD #1 s/p trach. Venting well. Continue to wean vent as tolerated. Will change trach next week.   LOS: 10 days   Akshat Minehart,SUI W 06/22/2015, 1:55 PM

## 2015-06-22 NOTE — Progress Notes (Signed)
PULMONARY / CRITICAL CARE MEDICINE   Name: Derrick [name] MRN: [id number] DOB: 08/03/1980    ADMISSION DATE:  06/12/2015 CONSULTATION DATE:  06/13/2015  REFERRING MD :  TRH  CHIEF COMPLAINT:  Hypercarbic respiratory failure and AMS  INITIAL PRESENTATION: 34 year old male with morbid obesity, OSA and OHV who presents to PCCM with AMS.  ABG was ordered and patient was noted to be hypercarbic and with respiratory acidosis.  Subsequently the patient developed hypoxemia as well.  Patient was transferred to the ICU and BiPAP was started.  F/U ABG post BiPAP showed no evidence of improvement and mental status continued to deteriorate so decision was made to intubate.  STUDIES:  CXR 11/13- with cardiomegally and pulmonary edema. Venous doppler 11/13- without DVT, superficial thrombosis, or Baker's cyst CXR 11/14- Hypoventilation with bibasilar atelectasis.  TTE 11/14- Mod concentric LV hypertrophy, EF 55-60%, grade 2 diastolic dysfunction, can't assess PV.  CXR 11/15- Slight interval deterioration in the pulmonary interstitium with increased pulmonary vascular prominence. Developing left lower lobe atelectasis is suspected.  CXR 11/17- Worsening pulmonary edema.  CXR 11/18- Slight improvement in pulmonary vascular congestion. Persistent bibasilar atelectasis or pneumonia CXR 11/19- Persistent mild lung base atelectasis and small effusions without pulmonary edema. CXR 11/21- Low lung vol, bibasilar atelectasis and small effusions Bronchoscopy 11/22- Impressive thin grey secretions with complete collapse rt main and all lobes rt and partial 50% left main  SIGNIFICANT EVENTS: 11/13 intubation for hypercarbic/hypoxic respiratory failure. 11/14- transitioned from Lovenox gtt to heparin gtt 11/15- removed PA catheter and replaced with CVL, decrease Lasix gtt  11/17- transitioned to intermittent Lasix 11/20- T 102.5, pan culture and Levaquin  11/22- bronchoscopy, tracheostomy in OR by  ENT   SUBJECTIVE:   Had bronch and tracheostomy performed yesterday.  Patient pulled himself off the vent overnight and began swinging at staff and restraints were ordered. Despite this, he required max Fentanyl and Precedex therefore PRN haldol was ordered.     VITAL SIGNS: Temp:  [102 F (38.9 C)-102.9 F (39.4 C)] 102 F (38.9 C) (11/22 0330) Pulse Rate:  [40-122] 81 (11/22 0600) Resp:  [14-29] 14 (11/22 0600) BP: (94-212)/(56-129) 125/76 mmHg (11/22 0600) SpO2:  [89 %-100 %] 95 % (11/22 0600) FiO2 (%):  [50 %] 50 % (11/22 0600) Weight:  [198 kg (436 lb 8.2 oz)] 198 kg (436 lb 8.2 oz) (11/22 0408) HEMODYNAMICS:   VENTILATOR SETTINGS: Vent Mode:  [-] PRVC FiO2 (%):  [50 %] 50 % Set Rate:  [14 bmp] 14 bmp Vt Set:  [580 mL] 580 mL PEEP:  [5 cmH20-10 cmH20] 10 cmH20 Plateau Pressure:  [24 cmH20-32 cmH20] 31 cmH20 INTAKE / OUTPUT:  Intake/Output Summary (Last 24 hours) at 06/22/15 0658 Last data filed at 06/22/15 0600  Gross per 24 hour  Intake 3947.56 ml  Output   3365 ml  Net 582.56 ml    PHYSICAL EXAMINATION: General:  Obese male alert. Alert  Neuro:  Sedated, intermittently opening eyes.  Cardiovascular:  Distant heart sounds.  No m/r/g noted.  Lungs:  Rhonchorous breath sounds over the anterior chest wall bilaterally. Trach in place, clean Abdomen:  Soft, obese, ND and +BS. Musculoskeletal:  Trace Bilateral LE edema.  Skin:  Intact, lichenification over the LE bilaterally  LABS:  CBC  Recent Labs Lab 06/19/15 0500 06/20/15 0419 06/21/15 0423  WBC 11.2* 10.9* 10.6*  HGB 11.5* 11.8* 12.6*  HCT 39.9 40.2 42.0  PLT 232 241 249   Coag's  Recent Labs Lab 06/21/15 1211  INR 1.35   BMET  Recent Labs Lab 06/20/15 0419 06/21/15 0423 06/22/15 0413  NA 149* 149* 155*  K 3.7 3.7 3.7  CL 96* 98* 105  CO2 45* 44* 42*  BUN 54* 52* 46*  CREATININE 1.30* 1.28* 1.50*  GLUCOSE 281* 381* 259*   Electrolytes  Recent Labs Lab 06/18/15 0450   06/20/15 0419 06/21/15 0423 06/22/15 0413  CALCIUM 9.4  < > 9.7 9.6 9.3  MG 2.1  --   --   --   --   < > = values in this interval not displayed. Sepsis Markers No results for input(s): LATICACIDVEN, PROCALCITON, O2SATVEN in the last 168 hours. ABG  Recent Labs Lab 06/15/15 0800 06/15/15 0950 06/16/15 0358  PHART 7.283* 7.405 7.406  PCO2ART 85.7* 63.6* 68.3*  PO2ART 80.3 68.2* 71.6*   Liver Enzymes  Recent Labs Lab 06/16/15 0400  AST 22  ALT 20  ALKPHOS 117  BILITOT 1.0  ALBUMIN 3.0*   Cardiac Enzymes No results for input(s): TROPONINI, PROBNP in the last 168 hours. Glucose  Recent Labs Lab 06/21/15 0757 06/21/15 1151 06/21/15 1517 06/21/15 1945 06/21/15 2336 06/22/15 0357  GLUCAP 346* 313* 266* 241* 273* 232*    Imaging Dg Chest Port 1 View  06/21/2015  CLINICAL DATA:  Bronchoscopy EXAM: PORTABLE CHEST 1 VIEW COMPARISON:  06/21/2015 FINDINGS: The heart remains markedly enlarged. Endotracheal and NG tubes are stable. Right jugular central venous catheter is stable. Low lung volumes with dense opacity at the right base is stable. There is no pneumothorax. Atelectasis at the left base is stable. IMPRESSION: No pneumothorax after bronchoscopy. Stable opacity at the right lung base and minimal atelectasis at the left base. Electronically Signed   By: Jolaine Click M.D.   On: 06/21/2015 13:28     ASSESSMENT / PLAN:  PULMONARY OETT 11/13>>>11/22 Trach 11/22>> A: VDRF, hypercarbic, due to morbid obesity, OSA, OHV and CHF. PE less likely given history COLLAPSE RLL s/p broncho with lavage on 11/22 Concerns for pneumonia VAP P:   - Full vent support with trach - Diuresis as b/p and scr allow  - Mucomyst - Chest PT - Treat with antibiotics as below -keep peep 10   CARDIOVASCULAR R IJ PAC 11/13>>>11/5, replaced on 11/15 with CVL A: CHF exacerbation due to morbid obesity and likely pulmonary edema and pulmonary HTN. Troponins negative Net +500cc  overnight P:  - Lasix dc  - Holding home lisinopril-HCTZ for now  -free water with tube feeds, consider IVF  d5w  RENAL A:  Hypernatremia  P:   - Consider adding D5W IVFs instead of tube feeds -chem in am and pm  -lasix dc  GASTROINTESTINAL A:  Nutrition. P:   - Holding tube feeds until new tube placed, cortrak -delay peg, assess wenaing - Protonix daily  HEMATOLOGIC A:  Anemia  DVT ppx P:  - SQ heparin - CBC in AM for wbc - Transfuse per ICU protocol  INFECTIOUS A: Fevers overnight, leukocytosis: PNA, collapse - concern HCAP, R/O VAP P:   Blood cx 11/20> Urine cx 11/20> NGTD  BAL 11/22>   Ceftaz 11/22>> Linazolid 11/22>> Levaquin  11/20>> 11/22  ENDOCRINE A:  DM.   A1c this hospitalization was 10.1 Hyperglycemia  P:   -  resistant SSI  -  Novolog to 6 units q4hrs while on tube feeds - Lantus 25u qHS - CBGs.  NEUROLOGIC A:  AMS due to hypercarbia, severe agitation, delirium  P:   RASS goal: 0 Precedex gtt  dc failed fent drip remains, assess qtc if less 500 add methadone Add around clock ativan Add Risperdal Continue prop but reduce throughout the day with above regimen  Golda AcreWUA    Crystal S. Dorsey, MD [facility] Resident  06/22/2015, 6:58 AM   STAFF NOTE: Cindi CarbonI, Taylyn Brame, MD FACP have personally reviewed patient's available data, including medical history, events of note, physical examination and test results as part of my evaluation. I have discussed with resident/NP and other care providers such as pharmacist, RN and RRT. In addition, I personally evaluated patient and elicited key findings of: ronchi remains, pcxr with collpase, keep peep 10, consider rebronch with such significant obstruction noted first bronch, mucomyst's and chest pt remains, keep current abx regimen, add d5w for Na, dc lasix have maxed affect, neuro is a problem, severe delirium, add rispirdal, add ativan, dc precdex , maintain prop, add methadone if QTC wnl,  upright as bale, place post pyloric and feed The patient is critically ill with multiple organ systems failure and requires high complexity decision making for assessment and support, frequent evaluation and titration of therapies, application of advanced monitoring technologies and extensive interpretation of multiple databases.   Critical Care Time devoted to patient care services described in this note is30 Minutes. This time reflects time of care of this signee: Rory Percyaniel Thorsten Climer, MD FACP. This critical care time does not reflect procedure time, or teaching time or supervisory time of PA/NP/Med student/Med Resident etc but could involve care discussion time. Rest per NP/medical resident whose note is outlined above and that I agree with   Mcarthur Rossettianiel J. Tyson AliasFeinstein, MD, FACP Pgr: [phone] [place] [facility] 06/22/2015 9:02 AM

## 2015-06-22 NOTE — Progress Notes (Addendum)
**  Critical Care Interval Note**  Per RN report, patient has been refusing medications.   Suspect delirium.  Tolerated trach placement.  Currently on Fentanyl, Propofol, Ativan and methadone.  BP 110/75, HR 105, O2 sat 93% on vent at 8pm.  Will continue to monitor.  Ashly M. Nadine CountsGottschalk, DO PGY-2, [facility]   **Addendum**  RN reports that patient became extremely agitated and removed CORTRAK tube.  Janina Mayorach remains in place.  On above medications.  Discussed care with Dr Isaiah SergeMannam and he agrees with adding additional agent.  QTc this am WNL.  Will add Haldol 5mg  IV q6 as needed agitation.  Ashly M. Nadine CountsGottschalk, DO PGY-2, [facility]

## 2015-06-22 NOTE — Progress Notes (Signed)
eLink Physician-Brief Progress Note Patient Name: Derrick HansenReginald A Gaylord DOB: 1981-02-16 MRN: [id number]   Date of Service  06/22/2015  HPI/Events of Note  Continues to be agitated. On max fentanyl and precedex  eICU Interventions  One dose haldol 5 mg. qTC was ok on 12th. Continue to monitor qTC. Order propofol if still agitated.      Intervention Category Intermediate Interventions: Other:  Kassadi Presswood 06/22/2015, 2:10 AM

## 2015-06-22 NOTE — Progress Notes (Signed)
eLink Physician-Brief Progress Note Patient Name: Derrick HansenReginald A [name] DOB: [birth year] MRN: [id number]   Date of Service  06/22/2015  HPI/Events of Note  Agitated, pulling off vent  eICU Interventions  Restraints ordered     Intervention Category Intermediate Interventions: Other:  Issaih Kaus 06/22/2015, 1:55 AM

## 2015-06-22 NOTE — Progress Notes (Addendum)
PT Cancellation Note  Patient Details Name: Derrick HansenReginald A Musco MRN: [id number] DOB: Dec 18, 1980   Cancelled Treatment:    Reason Eval/Treat Not Completed: Patient at procedure or test/unavailable Pt getting feeding tube placed. Will follow up next available time.  Pt continues to be sedated and on the vent and not ready for PT at this time.  Blake DivineShauna A Shree Espey 06/22/2015, 11:34 AM Mylo RedShauna Keesha Pellum, PT, DPT [phone]

## 2015-06-23 ENCOUNTER — Inpatient Hospital Stay (HOSPITAL_COMMUNITY): Payer: 59

## 2015-06-23 DIAGNOSIS — R1011 Right upper quadrant pain: Secondary | ICD-10-CM

## 2015-06-23 LAB — BASIC METABOLIC PANEL
Anion gap: 11 (ref 5–15)
Anion gap: 7 (ref 5–15)
BUN: 67 mg/dL — AB (ref 6–20)
BUN: 56 mg/dL — ABNORMAL HIGH (ref 6–20)
CHLORIDE: 104 mmol/L (ref 101–111)
CO2: 37 mmol/L — ABNORMAL HIGH (ref 22–32)
CO2: 37 mmol/L — ABNORMAL HIGH (ref 22–32)
CREATININE: 2.11 mg/dL — AB (ref 0.61–1.24)
Calcium: 9.1 mg/dL (ref 8.9–10.3)
Calcium: 8.9 mg/dL (ref 8.9–10.3)
Chloride: 103 mmol/L (ref 101–111)
Creatinine, Ser: 1.56 mg/dL — ABNORMAL HIGH (ref 0.61–1.24)
GFR calc Af Amer: 46 mL/min — ABNORMAL LOW (ref >60)
GFR calc Af Amer: >60 mL/min (ref >60)
GFR calc non Af Amer: 39 mL/min — ABNORMAL LOW (ref >60)
GFR, EST NON AFRICAN AMERICAN: 57 mL/min — AB (ref >60)
GLUCOSE: 232 mg/dL — AB (ref 65–99)
Glucose, Bld: 230 mg/dL — ABNORMAL HIGH (ref 65–99)
POTASSIUM: 3.6 mmol/L (ref 3.5–5.1)
POTASSIUM: 3.4 mmol/L — AB (ref 3.5–5.1)
Sodium: 152 mmol/L — ABNORMAL HIGH (ref 135–145)
Sodium: 147 mmol/L — ABNORMAL HIGH (ref 135–145)

## 2015-06-23 LAB — GLUCOSE, CAPILLARY
GLUCOSE-CAPILLARY: 246 mg/dL — AB (ref 65–99)
GLUCOSE-CAPILLARY: 200 mg/dL — AB (ref 65–99)
GLUCOSE-CAPILLARY: 197 mg/dL — AB (ref 65–99)
GLUCOSE-CAPILLARY: 209 mg/dL — AB (ref 65–99)
GLUCOSE-CAPILLARY: 250 mg/dL — AB (ref 65–99)
Glucose-Capillary: 207 mg/dL — ABNORMAL HIGH (ref 65–99)

## 2015-06-23 LAB — CULTURE, RESPIRATORY W GRAM STAIN

## 2015-06-23 LAB — CBC
HEMATOCRIT: 37.9 % — AB (ref 39.0–52.0)
Hemoglobin: 10.9 g/dL — ABNORMAL LOW (ref 13.0–17.0)
MCH: 28 pg (ref 26.0–34.0)
MCHC: 28.8 g/dL — AB (ref 30.0–36.0)
MCV: 97.4 fL (ref 78.0–100.0)
Platelets: 202 10*3/uL (ref 150–400)
RBC: 3.89 MIL/uL — ABNORMAL LOW (ref 4.22–5.81)
RDW: 14.6 % (ref 11.5–15.5)
WBC: 13.4 10*3/uL — ABNORMAL HIGH (ref 4.0–10.5)

## 2015-06-23 LAB — CULTURE, RESPIRATORY

## 2015-06-23 MED ORDER — LORAZEPAM 2 MG/ML IJ SOLN
2.0000 mg | Freq: Three times a day (TID) | INTRAMUSCULAR | Status: DC | PRN
  Administered 2015-06-23: 2 mg via INTRAVENOUS
  Filled 2015-06-23: qty 1

## 2015-06-23 MED ORDER — LORAZEPAM 2 MG/ML IJ SOLN: 1.0000 mg | Freq: Three times a day (TID) | INTRAMUSCULAR | Status: DC | PRN

## 2015-06-23 MED ORDER — WHITE PETROLATUM GEL
Status: AC
  Administered 2015-06-23: 12:00:00
  Filled 2015-06-23: qty 1

## 2015-06-23 MED ORDER — HALOPERIDOL LACTATE 5 MG/ML IJ SOLN: 5.0000 mg | Freq: Four times a day (QID) | INTRAMUSCULAR | Status: DC | PRN

## 2015-06-23 MED ORDER — INSULIN ASPART 100 UNIT/ML ~~LOC~~ SOLN
6.0000 [IU] | SUBCUTANEOUS | Status: DC
  Administered 2015-06-23 – 2015-06-26 (×17): 6 [IU] via SUBCUTANEOUS

## 2015-06-23 MED ORDER — RISPERIDONE 1 MG/ML PO SOLN
2.0000 mg | Freq: Two times a day (BID) | ORAL | Status: DC
  Administered 2015-06-23 – 2015-06-28 (×11): 2 mg via ORAL
  Filled 2015-06-23 (×12): qty 2

## 2015-06-23 NOTE — Progress Notes (Signed)
Subjective: On vent. No trach issues.  Objective: Vital signs in last 24 hours: Temp:  [98.3 F (36.8 C)-102 F (38.9 C)] 98.3 F (36.8 C) (11/23 0400) Pulse Rate:  [74-107] 80 (11/23 0600) Resp:  [0-18] 18 (11/23 0600) BP: (87-123)/(49-76) 107/72 mmHg (11/23 0600) SpO2:  [90 %-96 %] 96 % (11/23 0600) FiO2 (%):  [50 %] 50 % (11/23 0600) Weight:  [199 kg (438 lb 11.5 oz)] 199 kg (438 lb 11.5 oz) (11/23 0300)  PHYSICAL EXAMINATION: General: Morbidly obese male. On vent. Neuro:Sedated. Neck: Obese. Trach in place. Oral: Normal mucosa. Lungs: Rhonchorous breath sounds over the anterior chest wall bilaterally.    Recent Labs  06/22/15 0413 06/23/15 0409  WBC 13.4* 13.4*  HGB 12.3* 10.9*  HCT 42.2 37.9*  PLT 242 202    Recent Labs  06/22/15 1600 06/23/15 0409  NA 152* 152*  K 3.4* 3.6  CL 106 104  CO2 38* 37*  GLUCOSE 242* 232*  BUN 54* 67*  CREATININE 1.90* 2.11*  CALCIUM 8.7* 9.1    Medications:  I have reviewed the patient's current medications. Scheduled: . alteplase  2 mg Intracatheter Once  . cefTAZidime (FORTAZ)  IV  2 g Intravenous 3 times per day  . chlorhexidine  15 mL Mouth Rinse BID  . feeding supplement (VITAL HIGH PROTEIN)  1,000 mL Per Tube Q24H  . heparin subcutaneous  5,000 Units Subcutaneous 3 times per day  . insulin aspart  0-20 Units Subcutaneous 6 times per day  . insulin glargine  25 Units Subcutaneous QHS  . loratadine  10 mg Oral Daily  . methadone  10 mg Oral QID  . pantoprazole sodium  40 mg Per Tube Q24H  . polyethylene glycol  17 g Oral Daily  . risperiDONE  2 mg Oral BID   ZOX:[id number]:acetaminophen (TYLENOL) oral liquid 160 mg/5 mL, bisacodyl, fentaNYL (SUBLIMAZE) injection, haloperidol lactate, LORazepam, midazolam, [DISCONTINUED] ondansetron **OR** ondansetron (ZOFRAN) IV, sennosides  Assessment/Plan: POD #2 s/p trach. Venting well. Continue to wean vent as tolerated. Will change trach next week.    LOS: 11 days   Kylee Nardozzi,SUI  W 06/23/2015, 7:57 AM

## 2015-06-23 NOTE — Progress Notes (Signed)
PULMONARY / CRITICAL CARE MEDICINE   Name: Derrick [name] MRN: [id number] DOB: 1981-06-09    ADMISSION DATE:  06/12/2015 CONSULTATION DATE:  06/13/2015  REFERRING MD :  TRH  CHIEF COMPLAINT:  Hypercarbic respiratory failure and AMS  INITIAL PRESENTATION: 34 year old male with morbid obesity, OSA and OHV who presents to PCCM with AMS.  ABG was ordered and patient was noted to be hypercarbic and with respiratory acidosis.  Subsequently the patient developed hypoxemia as well.  Patient was transferred to the ICU and BiPAP was started.  F/U ABG post BiPAP showed no evidence of improvement and mental status continued to deteriorate so decision was made to intubate.  STUDIES:  CXR 11/13- with cardiomegally and pulmonary edema. Venous doppler 11/13- without DVT, superficial thrombosis, or Baker's cyst CXR 11/14- Hypoventilation with bibasilar atelectasis.  TTE 11/14- Mod concentric LV hypertrophy, EF 55-60%, grade 2 diastolic dysfunction, can't assess PV.  CXR 11/15- Slight interval deterioration in the pulmonary interstitium with increased pulmonary vascular prominence. Developing left lower lobe atelectasis is suspected.  CXR 11/17- Worsening pulmonary edema.  CXR 11/18- Slight improvement in pulmonary vascular congestion. Persistent bibasilar atelectasis or pneumonia CXR 11/19- Persistent mild lung base atelectasis and small effusions without pulmonary edema. CXR 11/21- Low lung vol, bibasilar atelectasis and small effusions Bronchoscopy 11/22- Impressive thin grey secretions with complete collapse rt main and all lobes rt and partial 50% left main  SIGNIFICANT EVENTS: 11/13 intubation for hypercarbic/hypoxic respiratory failure. 11/14- transitioned from Lovenox gtt to heparin gtt 11/15- removed PA catheter and replaced with CVL, decrease Lasix gtt  11/17- transitioned to intermittent Lasix 11/20- T 102.5, pan culture and Levaquin  11/22- bronchoscopy, tracheostomy in OR by  ENT   SUBJECTIVE:   Pulled NGT out Remains on prop and fentanyl   VITAL SIGNS: Temp:  [98.3 F (36.8 C)-102 F (38.9 C)] 98.3 F (36.8 C) (11/23 0400) Pulse Rate:  [74-107] 80 (11/23 0600) Resp:  [0-18] 18 (11/23 0600) BP: (87-124)/(49-81) 107/72 mmHg (11/23 0600) SpO2:  [90 %-96 %] 96 % (11/23 0600) FiO2 (%):  [50 %] 50 % (11/23 0600) Weight:  [199 kg (438 lb 11.5 oz)] 199 kg (438 lb 11.5 oz) (11/23 0300) HEMODYNAMICS:   VENTILATOR SETTINGS: Vent Mode:  [-] PRVC FiO2 (%):  [50 %] 50 % Set Rate:  [14 bmp] 14 bmp Vt Set:  [580 mL] 580 mL PEEP:  [10 cmH20] 10 cmH20 Plateau Pressure:  [26 cmH20-29 cmH20] 26 cmH20 INTAKE / OUTPUT:  Intake/Output Summary (Last 24 hours) at 06/23/15 [id number] Last data filed at 06/23/15 0600  Gross per 24 hour  Intake 5118.35 ml  Output   1600 ml  Net 3518.35 ml    PHYSICAL EXAMINATION: General:  Obese male alert. Alert  Neuro:  Opens eyes, perrl, agitation intetermittent Cardiovascular:  s1 s2 RRR distant Lungs:  rhonchi reduced bases Abdomen:  Soft, obese, ND and +BS. Musculoskeletal:  Trace Bilateral LE edema.  Skin:  Intact, no new rashes  LABS:  CBC  Recent Labs Lab 06/21/15 0423 06/22/15 0413 06/23/15 0409  WBC 10.6* 13.4* 13.4*  HGB 12.6* 12.3* 10.9*  HCT 42.0 42.2 37.9*  PLT 249 242 202   Coag's  Recent Labs Lab 06/21/15 1211  INR 1.35   BMET  Recent Labs Lab 06/22/15 0413 06/22/15 1600 06/23/15 0409  NA 155* 152* 152*  K 3.7 3.4* 3.6  CL 105 106 104  CO2 42* 38* 37*  BUN 46* 54* 67*  CREATININE 1.50* 1.90* 2.11*  GLUCOSE 259* 242* 232*   Electrolytes  Recent Labs Lab 06/18/15 0450  06/22/15 0413 06/22/15 1600 06/23/15 0409  CALCIUM 9.4  < > 9.3 8.7* 9.1  MG 2.1  --   --   --   --   < > = values in this interval not displayed. Sepsis Markers No results for input(s): LATICACIDVEN, PROCALCITON, O2SATVEN in the last 168 hours. ABG No results for input(s): PHART, PCO2ART, PO2ART in the last 168  hours. Liver Enzymes No results for input(s): AST, ALT, ALKPHOS, BILITOT, ALBUMIN in the last 168 hours. Cardiac Enzymes No results for input(s): TROPONINI, PROBNP in the last 168 hours. Glucose  Recent Labs Lab 06/22/15 0806 06/22/15 1147 06/22/15 1523 06/22/15 1941 06/22/15 2325 06/23/15 0359  GLUCAP 166* 156* 190* 207* 246* 200*    Imaging Dg Abd Portable 1v  06/22/2015  CLINICAL DATA:  Feeding tube placement EXAM: PORTABLE ABDOMEN - 1 VIEW COMPARISON:  June 13, 2015 FINDINGS: Feeding tube tip is at the level of the fourth portion of the duodenum. The bowel gas pattern is within normal limits. IMPRESSION: Feeding tube tip at level of fourth portion of duodenum. Bowel gas pattern within normal limits. Electronically Signed   By: Bretta Bang III M.D.   On: 06/22/2015 12:30     ASSESSMENT / PLAN:  PULMONARY OETT 11/13>>>11/22 Trach 11/22>> A: VDRF, hypercarbic, due to morbid obesity, OSA, OHV and CHF. PE less likely given history COLLAPSE RLL s/p broncho with lavage on 11/22 Concerns for pneumonia VAP P:   - pcxr now and in am , re assess rt Lower lobe - Mucomyst, allow to dc - Chest PT - peep to 8 as goal -consider ps 10, cpap 8-10 attempt  CARDIOVASCULAR R IJ PAC 11/13>>>11/5, replaced on 11/15 with CVL A: CHF exacerbation due to morbid obesity and likely pulmonary edema and pulmonary HTN. Troponins negative Net +500cc overnight P:  - Holding home lisinopril-HCTZ for now  -free water with tube feeds -na noted, increase d5w further, bmet in pm and in am  RENAL A:  Hypernatremia remains with rise crt P:   - d5w to 125 -chem in am and pm  -lasix no  GASTROINTESTINAL A:  Nutrition, aspiration TF in past? P:   -replace cortrak, prefer POST pyloric - Protonix daily  HEMATOLOGIC A:  Anemia  DVT ppx P:  - SQ heparin - CBC in AM - Transfuse per ICU protocol  INFECTIOUS A: Fevers overnight, leukocytosis: PNA, collapse - concern HCAP, R/O  VAP P:   Blood cx 11/20> Urine cx 11/20> NGTD  BAL 11/22>   Ceftaz 11/22>> Linazolid 11/22>>11/23 Levaquin  11/20>> 11/22  No staph, dc linazolid Keep ceftaz  ENDOCRINE A:  DM.   A1c this hospitalization was 10.1 Hyperglycemia  P:   -  resistant SSI  -  Novolog to 6 units q4hrs while on tube feeds - Lantus 25u qHS, increase if/when TF restarted - CBGs.  NEUROLOGIC A:  AMS due to hypercarbia, severe agitation, delirium , failure to precedex P:   RASS goal: 0 Methadone to 20 q6h  if unable to dc fentanyl over next 24 hrs Increase ativan Risperdal to 2 mg BID Continue prop but reduce to off as goal WUA  IF unable to dc drips by am will consider Depakote for refractory delirium   Ccm time 30 min   Mcarthur Rossetti. Tyson Alias, MD, FACP Pgr: [phone] [facility] 06/23/2015 6:52 AM

## 2015-06-23 NOTE — Progress Notes (Addendum)
Spoke with Margaretmary LombardMarkisha Johnson-Distler- regarding pt's insurance - per conversation Gilmore LarocheMarkisha states that pt was added to her insurance on  Nov. 1- have spoken with [facility] office- and tried to confirm this with [facility]- however pt does not show up under insurance plan. Gilmore LarocheMarkisha shows up with insurance plan with [facility] but pt does not show under plan. Spoke with Gilmore LarocheMarkisha again along with Rosalita ChessmanSuzanne at [facility] via 3 way TC. Per Rosalita ChessmanSuzanne at [facility] pt is a dependent on Markisha's plan at [facility] - however they have not been able to issue the insurance cards yet nor do they have the policy # to give due to a processing issue on [facility] end. Per Rosalita ChessmanSuzanne pt should show in the [facility] system by Nov. 28 or Dec. 2 at the latest. Rosalita ChessmanSuzanne with [facility] instructed Gilmore LarocheMarkisha to send a benefits confirmation in the meantime to show pt is on policy plan. [facility] emailed that to [employer].  Benefits Confirmation Statement Your Information Name: Sherlon HandingMARKISHA JOHNSON Address: [address] [place], [place] [address] Phone: Gender: F Email: [email or web address] Employee Number: [id number] Hire Date: 09/14/2014 Company: [employer] Your Dependents Name Age Gender Relationship QM[employer]O Dorthula Mataseginald Allen Smaltz I [place]33 M Spouse N Bulmaro [place] II [place]0 M Child N Start Date 06/01/2015 Tommie ArdReginald [place] I (Spouse) 06/01/2015 Tommie Ardeginald Belshe II (Child) 06/01/2015 Spouse/Domestic Partner Surcharge Attestation My Spouse/DP does not have access to other group coverage. Enrolled $0.00 Start Date 06/01/2015  Have spoken multiple times to [facility] in [facility] office- she will f/u regarding insurance verification next week - and enter into system once verified.  CM will f/u to ensure insurance is verified.   1345- received call back from [facility]- [employer] has called her and given insurance Member ID # [id number] Group # [id number]-- have given this info to [facility] Jasmine- who was able to verify insurance and will place insurance info into Epic. Now that pt's insurance has been  verified may be LTACH appropriate pending insurance approval- will have LTACH look into insurance coverage.

## 2015-06-23 NOTE — Progress Notes (Signed)
Pt became very agitated, pulled off restraints and started pulling at tubes on face. Pt removed cortrak feeding tube completely and attempted to grab trach also. Pt swinging arms and trying to elbow staff members. Pt re-restrained, did not successfully decannulate himself. Pt bolused at this time with fentanyl, CCM resident made aware of situation. Vitals stable. Will continue to monitor closely and wait for new orders.

## 2015-06-23 NOTE — Progress Notes (Signed)
Inpatient Diabetes Program Recommendations  AACE/ADA: New Consensus Statement on Inpatient Glycemic Control (2015)  Target Ranges:  Prepandial:   less than 140 mg/dL      Peak postprandial:   less than 180 mg/dL (1-2 hours)      Critically ill patients:  140 - 180 mg/dL  Results for Mitzi HansenGILLESPIE, Kayce A (MRN [id number]) as of 06/23/2015 09:05  Ref. Range 06/22/2015 03:57 06/22/2015 08:06 06/22/2015 11:47 06/22/2015 15:23 06/22/2015 19:41 06/22/2015 23:25 06/23/2015 03:59 06/23/2015 08:21  Glucose-Capillary Latest Ref Range: 65-99 mg/dL 119232 (H) 147166 (H) 829156 (H) 190 (H) 207 (H) 246 (H) 200 (H) 197 (H)   Review of Glycemic Control  Diabetes history: DM2 Outpatient Diabetes medications: Amaryl 4 mg QAM, Metformin 1000 mg BID Current orders for Inpatient glycemic control: Lantus 25 units QHS, Novolog 0-20 units Q4H  Inpatient Diabetes Program Recommendations:  Insulin - Meal Coverage: Once tube feeding is restarted, please consider ordering Novolog 6 units Q4H for tube feeding coverage (which would be held if tube feeding is on hold).  Note: Noted in Dr. Gwendolyn GrantFeinstein's note for 11/22 and for 11/23 he has indicated patient is ordered Novolog 6 units Q4H for tube feeding coverage but there is not an order for tube feeding coverage. Also noted tube feeding is currently on hold because patient pulled NG tube out. Spoke with Stark JockIrekia, RN and requested that once tube feeding is restarted that she talk with MD about ordering TF coverage.  Thanks, Orlando PennerMarie Elveta Rape, RN, MSN, CDE Diabetes Coordinator Inpatient Diabetes Program [phone] (Team Pager from 8am to 5pm) [phone] ([facility] office) [phone] [facility] office) [phone] [facility] office)

## 2015-06-23 NOTE — Progress Notes (Signed)
PT Cancellation Note  Patient Details Name: Derrick HansenReginald A Waring MRN: [id number] DOB: [birth year]   Cancelled Treatment:    Reason Eval/Treat Not Completed: Patient not medically ready  Pt continues to be sedated due to agitation. Will follow up next available time.  Blake DivineShauna A Marylene Masek 06/23/2015, 8:53 AM Mylo RedShauna Kendalyn Cranfield, PT, DPT [phone]

## 2015-06-24 ENCOUNTER — Inpatient Hospital Stay (HOSPITAL_COMMUNITY): Payer: 59

## 2015-06-24 DIAGNOSIS — R1010 Upper abdominal pain, unspecified: Secondary | ICD-10-CM

## 2015-06-24 LAB — BASIC METABOLIC PANEL
ANION GAP: 8 (ref 5–15)
Anion gap: 6 (ref 5–15)
BUN: 41 mg/dL — AB (ref 6–20)
BUN: 34 mg/dL — ABNORMAL HIGH (ref 6–20)
CALCIUM: 8.9 mg/dL (ref 8.9–10.3)
CHLORIDE: 102 mmol/L (ref 101–111)
CO2: 38 mmol/L — AB (ref 22–32)
CO2: 35 mmol/L — AB (ref 22–32)
CREATININE: 1.22 mg/dL (ref 0.61–1.24)
CREATININE: 0.98 mg/dL (ref 0.61–1.24)
Calcium: 9 mg/dL (ref 8.9–10.3)
Chloride: 102 mmol/L (ref 101–111)
GFR calc Af Amer: >60 mL/min (ref >60)
GFR calc Af Amer: >60 mL/min (ref >60)
GFR calc non Af Amer: >60 mL/min (ref >60)
GLUCOSE: 235 mg/dL — AB (ref 65–99)
GLUCOSE: 291 mg/dL — AB (ref 65–99)
POTASSIUM: 3.6 mmol/L (ref 3.5–5.1)
Potassium: 3.7 mmol/L (ref 3.5–5.1)
Sodium: 146 mmol/L — ABNORMAL HIGH (ref 135–145)
Sodium: 145 mmol/L (ref 135–145)

## 2015-06-24 LAB — CBC WITH DIFFERENTIAL/PLATELET
Basophils Absolute: 0 10*3/uL (ref 0.0–0.1)
Basophils Relative: 0 %
EOS ABS: 0.3 10*3/uL (ref 0.0–0.7)
EOS PCT: 3 %
HCT: 36.6 % — ABNORMAL LOW (ref 39.0–52.0)
Hemoglobin: 10.9 g/dL — ABNORMAL LOW (ref 13.0–17.0)
LYMPHS ABS: 2 10*3/uL (ref 0.7–4.0)
LYMPHS PCT: 20 %
MCH: 28.2 pg (ref 26.0–34.0)
MCHC: 29.8 g/dL — AB (ref 30.0–36.0)
MCV: 94.6 fL (ref 78.0–100.0)
MONO ABS: 0.4 10*3/uL (ref 0.1–1.0)
MONOS PCT: 4 %
Neutro Abs: 7.4 10*3/uL (ref 1.7–7.7)
Neutrophils Relative %: 73 %
PLATELETS: 216 10*3/uL (ref 150–400)
RBC: 3.87 MIL/uL — AB (ref 4.22–5.81)
RDW: 14.4 % (ref 11.5–15.5)
WBC: 10.1 10*3/uL (ref 4.0–10.5)

## 2015-06-24 LAB — GLUCOSE, CAPILLARY
GLUCOSE-CAPILLARY: 268 mg/dL — AB (ref 65–99)
GLUCOSE-CAPILLARY: 257 mg/dL — AB (ref 65–99)
Glucose-Capillary: 181 mg/dL — ABNORMAL HIGH (ref 65–99)
Glucose-Capillary: 199 mg/dL — ABNORMAL HIGH (ref 65–99)
Glucose-Capillary: 232 mg/dL — ABNORMAL HIGH (ref 65–99)
Glucose-Capillary: 251 mg/dL — ABNORMAL HIGH (ref 65–99)
Glucose-Capillary: 275 mg/dL — ABNORMAL HIGH (ref 65–99)

## 2015-06-24 LAB — CULTURE, BAL-QUANTITATIVE

## 2015-06-24 LAB — CULTURE, BAL-QUANTITATIVE W GRAM STAIN

## 2015-06-24 MED ORDER — METHADONE HCL 5 MG PO TABS
5.0000 mg | ORAL_TABLET | Freq: Four times a day (QID) | ORAL | Status: DC
  Administered 2015-06-24 (×3): 5 mg via ORAL
  Filled 2015-06-24 (×3): qty 1

## 2015-06-24 MED ORDER — POTASSIUM CHLORIDE 20 MEQ/15ML (10%) PO SOLN
20.0000 meq | ORAL | Status: AC
  Administered 2015-06-24 (×2): 20 meq
  Filled 2015-06-24 (×2): qty 15

## 2015-06-24 MED ORDER — LORAZEPAM 2 MG/ML IJ SOLN
2.0000 mg | Freq: Two times a day (BID) | INTRAMUSCULAR | Status: DC | PRN
  Administered 2015-06-25 – 2015-06-26 (×2): 2 mg via INTRAVENOUS
  Filled 2015-06-24 (×2): qty 1

## 2015-06-24 MED ORDER — DEXTROSE 5 % IV SOLN
1.0000 g | INTRAVENOUS | Status: AC
  Administered 2015-06-24 – 2015-06-29 (×6): 1 g via INTRAVENOUS
  Filled 2015-06-24 (×6): qty 10

## 2015-06-24 NOTE — Progress Notes (Signed)
PULMONARY / CRITICAL CARE MEDICINE   Name: Derrick [name] Derrick [name] MRN: [id number] DOB: Jul 05, 1981    ADMISSION DATE:  06/12/2015 CONSULTATION DATE:  06/13/2015  REFERRING MD :  TRH  CHIEF COMPLAINT:  Hypercarbic respiratory failure and AMS  INITIAL PRESENTATION: 34 year old male with morbid obesity, OSA and OHV who presents to PCCM with AMS.  ABG was ordered and patient was noted to be hypercarbic and with respiratory acidosis.  Subsequently the patient developed hypoxemia as well.  Patient was transferred to the ICU and BiPAP was started.  F/U ABG post BiPAP showed no evidence of improvement and mental status continued to deteriorate so decision was made to intubate.  STUDIES:  CXR 11/13- with cardiomegally and pulmonary edema. Venous doppler 11/13- without DVT, superficial thrombosis, or Baker's cyst CXR 11/14- Hypoventilation with bibasilar atelectasis.  TTE 11/14- Mod concentric LV hypertrophy, EF 55-60%, grade 2 diastolic dysfunction, can't assess PV.  CXR 11/15- Slight interval deterioration in the pulmonary interstitium with increased pulmonary vascular prominence. Developing left lower lobe atelectasis is suspected.  CXR 11/17- Worsening pulmonary edema.  CXR 11/18- Slight improvement in pulmonary vascular congestion. Persistent bibasilar atelectasis or pneumonia CXR 11/19- Persistent mild lung base atelectasis and small effusions without pulmonary edema. CXR 11/21- Low lung vol, bibasilar atelectasis and small effusions Bronchoscopy 11/22- Impressive thin grey secretions with complete collapse rt main and all lobes rt and partial 50% left main  SIGNIFICANT EVENTS: 11/13 intubation for hypercarbic/hypoxic respiratory failure. 11/14- transitioned from Lovenox gtt to heparin gtt 11/15- removed PA catheter and replaced with CVL, decrease Lasix gtt  11/17- transitioned to intermittent Lasix 11/20- T 102.5, pan culture and Levaquin  11/22- bronchoscopy, tracheostomy in OR by  ENT 11/23- severe delirium  11/24- improved with new cocktail neuro, off drips  SUBJECTIVE:   Alert cooperative   VITAL SIGNS: Temp:  [98.4 F (36.9 C)-99.8 F (37.7 C)] 99.8 F (37.7 C) (11/24 0836) Pulse Rate:  [104-126] 111 (11/24 0800) Resp:  [14-31] 31 (11/24 0735) BP: (106-168)/(67-108) 148/88 mmHg (11/24 0800) SpO2:  [91 %-98 %] 91 % (11/24 0800) FiO2 (%):  [40 %-50 %] 40 % (11/24 0800) Weight:  [200.1 kg (441 lb 2.3 oz)] 200.1 kg (441 lb 2.3 oz) (11/24 0223) HEMODYNAMICS:   VENTILATOR SETTINGS: Vent Mode:  [-] PSV;CPAP FiO2 (%):  [40 %-50 %] 40 % Set Rate:  [14 bmp] 14 bmp Vt Set:  [580 mL] 580 mL PEEP:  [8 cmH20] 8 cmH20 Pressure Support:  [18 cmH20] 18 cmH20 Plateau Pressure:  [26 cmH20-32 cmH20] 26 cmH20 INTAKE / OUTPUT:  Intake/Output Summary (Last 24 hours) at 06/24/15 1013 Last data filed at 06/24/15 0800  Gross per 24 hour  Intake 3750.16 ml  Output   2690 ml  Net 1060.16 ml    PHYSICAL EXAMINATION: General:  Obese male alert. Alert  Neuro:  Opens eyes, perrl, calm, cooperative Cardiovascular:  s1 s2 RRR distant Lungs: reduced coarse Abdomen:  Soft, obese, ND and +BS. Musculoskeletal:  No edema Skin:  Intact, no new rashes  LABS:  CBC  Recent Labs Lab 06/22/15 0413 06/23/15 0409 06/24/15 0340  WBC 13.4* 13.4* 10.1  HGB 12.3* 10.9* 10.9*  HCT 42.2 37.9* 36.6*  PLT 242 202 216   Coag's  Recent Labs Lab 06/21/15 1211  INR 1.35   BMET  Recent Labs Lab 06/23/15 0409 06/23/15 1530 06/24/15 0340  NA 152* 147* 146*  K 3.6 3.4* 3.6  CL 104 103 102  CO2 37* 37* 38*  BUN  67* 56* 41*  CREATININE 2.11* 1.56* 1.22  GLUCOSE 232* 230* 235*   Electrolytes  Recent Labs Lab 06/18/15 0450  06/23/15 0409 06/23/15 1530 06/24/15 0340  CALCIUM 9.4  < > 9.1 8.9 9.0  MG 2.1  --   --   --   --   < > = values in this interval not displayed. Sepsis Markers No results for input(s): LATICACIDVEN, PROCALCITON, O2SATVEN in the last 168  hours. ABG No results for input(s): PHART, PCO2ART, PO2ART in the last 168 hours. Liver Enzymes No results for input(s): AST, ALT, ALKPHOS, BILITOT, ALBUMIN in the last 168 hours. Cardiac Enzymes No results for input(s): TROPONINI, PROBNP in the last 168 hours. Glucose  Recent Labs Lab 06/23/15 1123 06/23/15 1535 06/23/15 1919 06/24/15 06/24/15 0334 06/24/15 0809  GLUCAP 207* 209* 250* 199* 181* 268*    Imaging Dg Chest Port 1 View  06/24/2015  CLINICAL DATA:  Lung collapse. EXAM: PORTABLE CHEST 1 VIEW COMPARISON:  Chest x-rays dated 06/23/2015, 06/22/2015 and 06/15/2015 FINDINGS: Tracheostomy tube remains well positioned with tip just above the level of the carina. Enteric tube passes below the diaphragm. Right IJ central line is stable in position with tip likely at the junction of the brachiocephalic vein and SVC. Cardiomegaly is stable. Compared to the most recent studies, there is decreased interstitial edema suggesting improved fluid status. Vague opacity at the right lung base persists, likely atelectasis and/or small effusion. No new lung abnormality. No pneumothorax seen. IMPRESSION: Decreased interstitial edema suggesting improved fluid status. Cardiomegaly, stable. Persistent vague opacity at the right lung base, most likely atelectasis and/or small effusion as suggested on previous reports. Tubes and lines are stable in position. Electronically Signed   By: Bary Richard M.D.   On: 06/24/2015 07:25   Dg Abd Portable 1v  06/23/2015  CLINICAL DATA:  Encounter for feeding tube placement. EXAM: PORTABLE ABDOMEN - 1 VIEW COMPARISON:  06/22/2015 FINDINGS: Enteric tube tip appears to be in the second portion of the duodenum, retracted from the previous day's study. IMPRESSION: Enteric feeding tube tip now projects in the second portion of the duodenum. Electronically Signed   By: Amie Portland M.D.   On: 06/23/2015 11:31     ASSESSMENT / PLAN:  PULMONARY OETT  11/13>>>11/22 Trach 11/22>> Derrick: VDRF, hypercarbic, due to morbid obesity, OSA, OHV and CHF. PE less likely given history COLLAPSE RLL s/p broncho with lavage on 11/22 - resolved 11/24 Concerns for pneumonia VAP P:   - pcxr major improved!!! -peep to 5 goal, wean cpap 5 ps 10 -chest pt, dc -dc mucomysts -pcxr in am   CARDIOVASCULAR R IJ PAC 11/13>>>11/5, replaced on 11/15 with CVL Derrick: CHF exacerbation due to morbid obesity and likely pulmonary edema and pulmonary HTN. Troponins negative Net +500cc overnight P:  -d5w rate down -bmet in am   RENAL Derrick:  Hypernatremia remains with rise crt P:   - d5w to 75 -chem in am  GASTROINTESTINAL Derrick:  Nutrition, aspiration TF in past? P:   -post pyloric feeding -consider slp on vent, pmv on vent - Protonix daily  HEMATOLOGIC Derrick:  Anemia  DVT ppx P:  - SQ heparin - limit blood drws  INFECTIOUS Derrick: Fevers overnight, leukocytosis: PNA, collapse - concern HCAP, R/O VAP P:   Blood cx 11/20> Urine cx 11/20> NGTD  BAL 11/22> NF  Ceftaz 11/22>> Linazolid 11/22>>11/23 Levaquin  11/20>> 11/22  Narrow to ceftriaxone, empiric course abx for hcap ? VAP Total 8 days  ENDOCRINE  Derrick:  DM.   A1c this hospitalization was 10.1 Hyperglycemia  P:   -  resistant SSI  -  Novolog to 6 units q4hrs while on tube feeds - Lantus 25u qHS, keep as rate d5w down - CBGs.  NEUROLOGIC Derrick:  AMS due to hypercarbia, severe agitation, delirium MAJOT better 11/24!!, failure to precedex P:   RASS goal: 0 Methadone 10 q6h, likely reduce this in am , daily qtc Reduce ativan to q12h  Risperdal to 2 mg BID - keep WUA    Ccm time 30 min   Mcarthur Rossetti. Tyson Alias, MD, FACP Pgr: [phone] [facility] 06/24/2015 10:13 AM

## 2015-06-24 NOTE — Progress Notes (Signed)
Performed inline passy muir valve trial performed, with speech therapy.  Patient cuff was deflated and valve was placed inline.  Patient tolerated procedure well.  Sats maintained between 96-97%.  Vitals signs remained stable.  Patient lasted for about 20 minutes.  Once finished, cuff was reinflated.  No complications were noted.  Will continue to monitor.

## 2015-06-24 NOTE — Progress Notes (Signed)
 of Fentanyl wasted in sink. Kasey-RN witnessed.

## 2015-06-24 NOTE — Evaluation (Signed)
Passy-Muir Speaking Valve - Evaluation Patient Details  Name: Derrick HansenReginald A [name] MRN: [id number] Date of Birth: [birth year]  Today's Date: 06/24/2015 Time: [id number] SLP Time Calculation (min) (ACUTE ONLY): 30 min  Past Medical History:  Past Medical History  Diagnosis Date  . Hypertension   . Reflux   . Diabetes mellitus without complication [facility](HCC)    Past Surgical History:  Past Surgical History  Procedure Laterality Date  . Tracheostomy tube placement N/A 06/21/2015    Procedure: TRACHEOSTOMY;  Surgeon: Newman PiesSu Teoh, MD;  Location: [facility] OR;  Service: ENT;  Laterality: N/A;   HPI:  [age] year old male with morbid obesity, OSA and OHV who presents to PCCM with AMS. ABG was ordered and patient was noted to be hypercarbic and with respiratory acidosis. Subsequently the patient developed hypoxemia as well. Patient was transferred to the ICU and BiPAP was started. F/U ABG post BiPAP showed no evidence of improvement and mental status continued to deteriorate so decision was made to intubate. Pt was intubated 11/13-11/22, at which time trach was placed.   Assessment / Plan / Recommendation Clinical Impression  Pt has moderate amounts of secretions that he is working to manage throughout evaluation, with strong cough to expectorate most of them orally. Ice chip trials were provided with oral phase seeming WFL. Difficult to differentiate baseline coughing from secretions versus from ice chips presented, however given amount of work pt needs to manage his secretions at this time, would favor maintaining NPO status. Will continue to focus on secretion management and ice chip trials during therapy with SLP to determine readiness for PO diet versus FEES.    SLP Assessment  Patient needs continued Speech Lanaguage Pathology Services    Follow Up Recommendations   (tba)    Frequency and Duration min 2x/week  2 weeks    PMSV Trial PMSV was placed for: 20 min Able to redirect subglottic air  through upper airway: Yes Able to Attain Phonation: Yes Voice Quality: Breathy;Low vocal intensity Able to Expectorate Secretions: Yes Level of Secretion Expectoration with PMSV: Tracheal;Oral;Other (comment) (nasal) Breath Support for Phonation: Moderately decreased Intelligibility: Intelligibility reduced Phrase: 50-74% accurate Respirations During Trial: 14 SpO2 During Trial: 96 % Pulse During Trial: 120 Behavior: Alert;Cooperative   Tracheostomy Tube       Vent Dependency  Vent Mode: PRVC Set Rate: 14 bmp PEEP: 5 cmH20 FiO2 (%): 40 % Vt Set: 580 mL    Cuff Deflation Trial Tolerated Cuff Deflation: Yes Length of Time for Cuff Deflation Trial: 20 min Behavior: Alert;Cooperative    [name], M.A. CCC-SLP [phone]  [name], [place] 06/24/2015, 1:52 PM

## 2015-06-24 NOTE — Progress Notes (Signed)
Subjective: Awake and responsive. No trach or vent issues.  Objective: Vital signs in last 24 hours: Temp:  [98.4 F (36.9 C)-99.8 F (37.7 C)] 99.8 F (37.7 C) (11/24 0836) Pulse Rate:  [103-126] 111 (11/24 0800) Resp:  [14-31] 31 (11/24 0735) BP: (106-168)/(67-108) 148/88 mmHg (11/24 0800) SpO2:  [91 %-98 %] 91 % (11/24 0800) FiO2 (%):  [40 %-50 %] 40 % (11/24 0800) Weight:  [200.1 kg (441 lb 2.3 oz)] 200.1 kg (441 lb 2.3 oz) (11/24 0223)  PHYSICAL EXAMINATION: General: Morbidly obese male. On vent. Neuro:Awake and responsive Neck: Obese. Trach in place. Oral: Normal mucosa. Lungs: Noisy breath sounds over the anterior chest wall bilaterally.    Recent Labs  06/23/15 0409 06/24/15 0340  WBC 13.4* 10.1  HGB 10.9* 10.9*  HCT 37.9* 36.6*  PLT 202 216    Recent Labs  06/23/15 1530 06/24/15 0340  NA 147* 146*  K 3.4* 3.6  CL 103 102  CO2 37* 38*  GLUCOSE 230* 235*  BUN 56* 41*  CREATININE 1.56* 1.22  CALCIUM 8.9 9.0    Medications:  I have reviewed the patient's current medications. Scheduled: . alteplase  2 mg Intracatheter Once  . cefTAZidime (FORTAZ)  IV  2 g Intravenous 3 times per day  . chlorhexidine  15 mL Mouth Rinse BID  . feeding supplement (VITAL HIGH PROTEIN)  1,000 mL Per Tube Q24H  . heparin subcutaneous  5,000 Units Subcutaneous 3 times per day  . insulin aspart  0-20 Units Subcutaneous 6 times per day  . insulin aspart  6 Units Subcutaneous 6 times per day  . insulin glargine  25 Units Subcutaneous QHS  . loratadine  10 mg Oral Daily  . methadone  10 mg Oral QID  . pantoprazole sodium  40 mg Per Tube Q24H  . polyethylene glycol  17 g Oral Daily  . potassium chloride  20 mEq Per Tube Q4H  . risperiDONE  2 mg Oral BID   GNF:AOZHYQMVHQIONPRN:acetaminophen (TYLENOL) oral liquid 160 mg/5 mL, bisacodyl, fentaNYL (SUBLIMAZE) injection, haloperidol lactate, LORazepam, midazolam, [DISCONTINUED] ondansetron **OR** ondansetron (ZOFRAN) IV,  sennosides  Assessment/Plan: POD #3 s/p trach. Venting well. Continue to wean vent as tolerated. Will change trach next week.     LOS: 12 days   Thinh Cuccaro,SUI W 06/24/2015, 9:28 AM

## 2015-06-24 NOTE — Progress Notes (Signed)
[facility] FOR [facility] LAB REPLACEMENT ONLY  The patient does apply for the [facility] Electrolyte Replacment Protocol based on the criteria listed below:   1. Is GFR >/= 40 ml/min? Yes.    Patient's GFR today is >60 2. Is urine output >/= 0.5 ml/kg/hr for the last 6 hours? Yes.   Patient's UOP is 0.5 ml/kg/hr 3. Is BUN < 60 mg/dL? Yes.    Patient's BUN today is 41 4. Abnormal electrolyte(s):K3.6 5. Ordered repletion with: per protocol 6. If a panic level lab has been reported, has the CCM MD in charge been notified? Yes.  .   Physician:  Ladona HornsP Mannamn MD  Melrose NakayamaChisholm, Courtenay Hirth William 06/24/2015 6:25 AM

## 2015-06-24 NOTE — Evaluation (Signed)
Passy-Muir Speaking Valve - Evaluation Patient Details  Name: Derrick [name] MRN: [id number] Date of Birth: 22-Sep-1980  Today's Date: 06/24/2015 Time: 4098-11911249-1319 SLP Time Calculation (min) (ACUTE ONLY): 30 min  Past Medical History:  Past Medical History  Diagnosis Date  . Hypertension   . Reflux   . Diabetes mellitus without complication [name]    Past Surgical History:  Past Surgical History  Procedure Laterality Date  . Tracheostomy tube placement N/A 06/21/2015    Procedure: TRACHEOSTOMY;  Surgeon: Newman PiesSu Teoh, MD;  Location: [facility] OR;  Service: ENT;  Laterality: N/A;   HPI:  34 year old male with morbid obesity, OSA and OHV who presents to PCCM with AMS. ABG was ordered and patient was noted to be hypercarbic and with respiratory acidosis. Subsequently the patient developed hypoxemia as well. Patient was transferred to the ICU and BiPAP was started. F/U ABG post BiPAP showed no evidence of improvement and mental status continued to deteriorate so decision was made to intubate. Pt was intubated 11/13-11/22, at which time trach was placed.   Assessment / Plan / Recommendation Clinical Impression  Pt wore inline PMSV for 20 minutes with all VSS. RT assisted with vent management, keeping pt in PRVC but dropping PEEP from 5 to 0. No increase in volume was needed to keep peak pressures at baseline value. Pt's voice was breathy and low in intensity, but his intelligibility was improved with Mod cues to speak louder. Moderate amount of secretions of oral, nasal, and tracheal secretions noted throughout evaluation. Will continue inline PMSV trials with SLP.    SLP Assessment  Patient needs continued Speech Lanaguage Pathology Services    Follow Up Recommendations   (tba)    Frequency and Duration min 2x/week  2 weeks    PMSV Trial PMSV was placed for: 20 min Able to redirect subglottic air through upper airway: Yes Able to Attain Phonation: Yes Voice Quality: Breathy;Low vocal  intensity Able to Expectorate Secretions: Yes Level of Secretion Expectoration with PMSV: Tracheal;Oral;Other (comment) (nasal) Breath Support for Phonation: Moderately decreased Intelligibility: Intelligibility reduced Phrase: 50-74% accurate Respirations During Trial: 14 SpO2 During Trial: 96 % Pulse During Trial: 120 Behavior: Alert;Cooperative   Tracheostomy Tube       Vent Dependency  Vent Mode: PRVC Set Rate: 14 bmp PEEP: 5 cmH20 FiO2 (%): 40 % Vt Set: 580 mL    Cuff Deflation Trial Tolerated Cuff Deflation: Yes Length of Time for Cuff Deflation Trial: 20 min Behavior: Alert;Cooperative    [name], [name]  [name], [name] 06/24/2015, 1:46 PM

## 2015-06-25 ENCOUNTER — Encounter (HOSPITAL_COMMUNITY): Payer: Self-pay | Admitting: *Deleted

## 2015-06-25 LAB — BASIC METABOLIC PANEL
ANION GAP: 5 (ref 5–15)
BUN: 30 mg/dL — ABNORMAL HIGH (ref 6–20)
CALCIUM: 8.9 mg/dL (ref 8.9–10.3)
CHLORIDE: 101 mmol/L (ref 101–111)
CO2: 38 mmol/L — AB (ref 22–32)
Creatinine, Ser: 0.95 mg/dL (ref 0.61–1.24)
GFR calc Af Amer: >60 mL/min (ref >60)
GFR calc non Af Amer: >60 mL/min (ref >60)
GLUCOSE: 291 mg/dL — AB (ref 65–99)
Potassium: 3.5 mmol/L (ref 3.5–5.1)
Sodium: 144 mmol/L (ref 135–145)

## 2015-06-25 LAB — CBC
HCT: 36.9 % — ABNORMAL LOW (ref 39.0–52.0)
HEMOGLOBIN: 10.8 g/dL — AB (ref 13.0–17.0)
MCH: 27.8 pg (ref 26.0–34.0)
MCHC: 29.3 g/dL — AB (ref 30.0–36.0)
MCV: 94.9 fL (ref 78.0–100.0)
Platelets: 205 10*3/uL (ref 150–400)
RBC: 3.89 MIL/uL — AB (ref 4.22–5.81)
RDW: 14.2 % (ref 11.5–15.5)
WBC: 7.9 10*3/uL (ref 4.0–10.5)

## 2015-06-25 LAB — GLUCOSE, CAPILLARY
GLUCOSE-CAPILLARY: 303 mg/dL — AB (ref 65–99)
GLUCOSE-CAPILLARY: 263 mg/dL — AB (ref 65–99)
GLUCOSE-CAPILLARY: 204 mg/dL — AB (ref 65–99)
Glucose-Capillary: 248 mg/dL — ABNORMAL HIGH (ref 65–99)
Glucose-Capillary: 308 mg/dL — ABNORMAL HIGH (ref 65–99)

## 2015-06-25 LAB — CULTURE, BLOOD (ROUTINE X 2)
Culture: NO GROWTH
Culture: NO GROWTH

## 2015-06-25 LAB — TRIGLYCERIDES: TRIGLYCERIDES: 121 mg/dL (ref <150)

## 2015-06-25 MED ORDER — METHADONE HCL 5 MG PO TABS
5.0000 mg | ORAL_TABLET | Freq: Three times a day (TID) | ORAL | Status: DC
  Administered 2015-06-25 – 2015-06-26 (×3): 5 mg via ORAL
  Filled 2015-06-25 (×3): qty 1

## 2015-06-25 MED ORDER — INSULIN GLARGINE 100 UNIT/ML ~~LOC~~ SOLN
30.0000 [IU] | Freq: Every day | SUBCUTANEOUS | Status: DC
  Administered 2015-06-25: 30 [IU] via SUBCUTANEOUS
  Filled 2015-06-25 (×2): qty 0.3

## 2015-06-25 NOTE — Progress Notes (Signed)
PULMONARY / CRITICAL CARE MEDICINE   Name: Derrick [name] MRN: [id number] DOB: 05/27/1981    ADMISSION DATE:  06/12/2015 CONSULTATION DATE:  06/13/2015  REFERRING MD :  TRH  CHIEF COMPLAINT:  Hypercarbic respiratory failure and AMS  INITIAL PRESENTATION: 34 year old male with morbid obesity, OSA and OHV who presents to PCCM with AMS.  ABG was ordered and patient was noted to be hypercarbic and with respiratory acidosis.  Subsequently the patient developed hypoxemia as well.  Patient was transferred to the ICU and BiPAP was started.  F/U ABG post BiPAP showed no evidence of improvement and mental status continued to deteriorate so decision was made to intubate.  STUDIES:  CXR 11/13- with cardiomegally and pulmonary edema. Venous doppler 11/13- without DVT, superficial thrombosis, or Baker's cyst CXR 11/14- Hypoventilation with bibasilar atelectasis.  TTE 11/14- Mod concentric LV hypertrophy, EF 55-60%, grade 2 diastolic dysfunction, can't assess PV.  CXR 11/15- Slight interval deterioration in the pulmonary interstitium with increased pulmonary vascular prominence. Developing left lower lobe atelectasis is suspected.  CXR 11/17- Worsening pulmonary edema.  CXR 11/18- Slight improvement in pulmonary vascular congestion. Persistent bibasilar atelectasis or pneumonia CXR 11/19- Persistent mild lung base atelectasis and small effusions without pulmonary edema. CXR 11/21- Low lung vol, bibasilar atelectasis and small effusions Bronchoscopy 11/22- Impressive thin grey secretions with complete collapse rt main and all lobes rt and partial 50% left main  SIGNIFICANT EVENTS: 11/13 intubation for hypercarbic/hypoxic respiratory failure. 11/14- transitioned from Lovenox gtt to heparin gtt 11/15- removed PA catheter and replaced with CVL, decrease Lasix gtt  11/17- transitioned to intermittent Lasix 11/20- T 102.5, pan culture and Levaquin  11/22- bronchoscopy, tracheostomy in OR by  ENT 11/23- severe delirium  11/24- improved with new cocktail neuro, off drips  SUBJECTIVE:   Alert, SLP attempted   VITAL SIGNS: Temp:  [99 F (37.2 C)-100.6 F (38.1 C)] 99 F (37.2 C) (11/25 0315) Pulse Rate:  [101-127] 101 (11/25 0700) Resp:  [18-31] 20 (11/25 0331) BP: (113-164)/(70-111) 149/88 mmHg (11/25 0700) SpO2:  [91 %-100 %] 96 % (11/25 0700) FiO2 (%):  [40 %] 40 % (11/25 0400) Weight:  [214.098 kg (472 lb)] 214.098 kg (472 lb) (11/25 0337) HEMODYNAMICS:   VENTILATOR SETTINGS: Vent Mode:  [-] PRVC FiO2 (%):  [40 %] 40 % Set Rate:  [14 bmp] 14 bmp Vt Set:  [580 mL] 580 mL PEEP:  [5 cmH20-8 cmH20] 5 cmH20 Pressure Support:  [18 cmH20] 18 cmH20 Plateau Pressure:  [20 cmH20-28 cmH20] 24 cmH20 INTAKE / OUTPUT:  Intake/Output Summary (Last 24 hours) at 06/25/15 0723 Last data filed at 06/25/15 0600  Gross per 24 hour  Intake 4297.59 ml  Output   2020 ml  Net 2277.59 ml    PHYSICAL EXAMINATION: General:  Obese male alert. Alert  Neuro:  Opens eyes, perrl, calm, cooperative Cardiovascular:  s1 s2 RRR distant Lungs: reduced Abdomen:  Soft, obese, ND and +BS. Musculoskeletal:  No edema Skin:  Intact, no new rashes  LABS:  CBC  Recent Labs Lab 06/23/15 0409 06/24/15 0340 06/25/15 0437  WBC 13.4* 10.1 7.9  HGB 10.9* 10.9* 10.8*  HCT 37.9* 36.6* 36.9*  PLT 202 216 205   Coag's  Recent Labs Lab 06/21/15 1211  INR 1.35   BMET  Recent Labs Lab 06/24/15 0340 06/24/15 1559 06/25/15 0437  NA 146* 145 144  K 3.6 3.7 3.5  CL 102 102 101  CO2 38* 35* 38*  BUN 41* 34* 30*  CREATININE  1.22 0.98 0.95  GLUCOSE 235* 291* 291*   Electrolytes  Recent Labs Lab 06/24/15 0340 06/24/15 1559 06/25/15 0437  CALCIUM 9.0 8.9 8.9   Sepsis Markers No results for input(s): LATICACIDVEN, PROCALCITON, O2SATVEN in the last 168 hours. ABG No results for input(s): PHART, PCO2ART, PO2ART in the last 168 hours. Liver Enzymes No results for input(s): AST,  ALT, ALKPHOS, BILITOT, ALBUMIN in the last 168 hours. Cardiac Enzymes No results for input(s): TROPONINI, PROBNP in the last 168 hours. Glucose  Recent Labs Lab 06/24/15 0809 06/24/15 1126 06/24/15 1511 06/24/15 1938 06/24/15 2352 06/25/15 0318  GLUCAP 268* 251* 232* 257* 275* 248*    Imaging No results found.   ASSESSMENT / PLAN:  PULMONARY OETT 11/13>>>11/22 Trach 11/22>> A: VDRF, hypercarbic, due to morbid obesity, OSA, OHV and CHF. PE less likely given history COLLAPSE RLL s/p broncho with lavage on 11/22 - resolved 11/24 Concerns for pneumonia VAP P:   - wean ps reduction -in line pmv attempted -even balance goals  CARDIOVASCULAR R IJ PAC 11/13>>>11/5, replaced on 11/15 with CVL A: CHF exacerbation due to morbid obesity and likely pulmonary edema and pulmonary HTN. P:  -even goals  RENAL A:  Hypernatremia remains with rise crt P:   - d5w kvo -chem in am  GASTROINTESTINAL A:  Nutrition, aspiration TF in past? P:   -post pyloric feeding -slp noted, mayu need fees, consider this, secretions noted - Protonix daily  HEMATOLOGIC A:  Anemia  DVT ppx P:  - SQ heparin maintain - limit blood drws  INFECTIOUS A: Fevers overnight, leukocytosis: PNA, collapse - concern HCAP, R/O VAP P:   Blood cx 11/20> Urine cx 11/20> NGTD  BAL 11/22> NF  Ceftaz 11/22>>11/24 Linazolid 11/22>>11/23 Levaquin  11/20>> 11/22 Ceftriaxone to 11/24 to stop date   ENDOCRINE A:  DM.   A1c this hospitalization was 10.1 Hyperglycemia  P:   -  resistant SSI  -  Novolog to 6 units q4hrs while on tube feeds - Lantus to 30 and dc d5 to 10 cc/hr - CBGs.  NEUROLOGIC A:  AMS due to hypercarbia, severe agitation, delirium MAJOT better 11/24!!, failure to precedex P:   RASS goal: 0 Methadone 10 to q8h Reduce to daily  Risperdal to 2 mg BID - keep WUA  pt   Derrick RossettiDaniel J. Tyson AliasFeinstein, MD, FACP Pgr: [phone] [facility] 06/25/2015 7:23 AM

## 2015-06-25 NOTE — Progress Notes (Signed)
Utilization review completed.  

## 2015-06-25 NOTE — Progress Notes (Signed)
Subjective: No trach issues. Still on vent. Resting comfortably in chair.  Objective: Vital signs in last 24 hours: Temp:  [99 F (37.2 C)-100.6 F (38.1 C)] 100.1 F (37.8 C) (11/25 0811) Pulse Rate:  [101-127] 117 (11/25 1000) Resp:  [18-26] 20 (11/25 0331) BP: (113-186)/(70-111) 149/91 mmHg (11/25 1000) SpO2:  [88 %-100 %] 88 % (11/25 1000) FiO2 (%):  [40 %] 40 % (11/25 0400) Weight:  [214.098 kg (472 lb)] 214.098 kg (472 lb) (11/25 0337)  PHYSICAL EXAMINATION: General: Morbidly obese male. On vent. Neuro:Awake and responsive Neck: Obese. Trach in place. Oral: Normal mucosa. Lungs: Noisy breath sounds over the anterior chest wall bilaterally.    Recent Labs  06/24/15 0340 06/25/15 0437  WBC 10.1 7.9  HGB 10.9* 10.8*  HCT 36.6* 36.9*  PLT 216 205    Recent Labs  06/24/15 1559 06/25/15 0437  NA 145 144  K 3.7 3.5  CL 102 101  CO2 35* 38*  GLUCOSE 291* 291*  BUN 34* 30*  CREATININE 0.98 0.95  CALCIUM 8.9 8.9    Medications:  I have reviewed the patient's current medications. Scheduled: . alteplase  2 mg Intracatheter Once  . cefTRIAXone (ROCEPHIN)  IV  1 g Intravenous Q24H  . chlorhexidine  15 mL Mouth Rinse BID  . feeding supplement (VITAL HIGH PROTEIN)  1,000 mL Per Tube Q24H  . heparin subcutaneous  5,000 Units Subcutaneous 3 times per day  . insulin aspart  0-20 Units Subcutaneous 6 times per day  . insulin aspart  6 Units Subcutaneous 6 times per day  . insulin glargine  30 Units Subcutaneous QHS  . loratadine  10 mg Oral Daily  . methadone  5 mg Oral 3 times per day  . pantoprazole sodium  40 mg Per Tube Q24H  . polyethylene glycol  17 g Oral Daily  . risperiDONE  2 mg Oral BID   WUJ:[id number]:bisacodyl, fentaNYL (SUBLIMAZE) injection, LORazepam, [DISCONTINUED] ondansetron **OR** ondansetron (ZOFRAN) IV, sennosides  Assessment/Plan: POD #4 s/p trach. Venting well. Continue to wean vent as tolerated. Will change trach next week.    LOS: 13 days    Elvy Mclarty,SUI W 06/25/2015, 10:36 AM

## 2015-06-25 NOTE — Evaluation (Addendum)
Physical Therapy Evaluation Patient Details Name: Derrick [name] MRN: [id number] DOB: 1981/01/27 Today's Date: 06/25/2015   History of Present Illness  Patient is a 34 y/o male presents with abdominal pain and found to have hypercarbic respiratory failure and AMS. Required intubation 11/13. Pt became ventilator dependent with worsening pulmonary edema s/p trach 11/22. PMH includes Morbid Obesity, DM2, HTN, OSA   Clinical Impression  Patient presents with functional limitations due to deficits listed in PT problem list (see below). Pt tolerated sitting EOB and SPT to chair with assist of 2-3 people. Pt highly motivated to get out of bed. Has good family support. Pt having difficulty weaning. Would benefit from rehab to maximize independence and mobility prior to return home.     Follow Up Recommendations LTACH    Equipment Recommendations  Other (comment) (TBD)    Recommendations for Other Services OT consult     Precautions / Restrictions Precautions Precautions: Fall Precaution Comments: trach; flexiseal Restrictions Weight Bearing Restrictions: No      Mobility  Bed Mobility Overal bed mobility: Needs Assistance Bed Mobility: Supine to Sit     Supine to sit: Max assist;+2 for physical assistance;+2 for safety/equipment;HOB elevated     General bed mobility comments: Assist of 2-3 to bring BLEs to EOB and elevate trunk. Cues for technique. + dizziness.   Transfers Overall transfer level: Needs assistance Equipment used: 2 person hand held assist Transfers: Stand Pivot Transfers   Stand pivot transfers: Max assist;+2 physical assistance;+2 safety/equipment       General transfer comment: Attempted to stand x2, able to stand upright on second attempt with cues for proper foot/hand placement and assist of 2-3. Able to take a few pivotal steps to chair. HR increased to 130, increased RR.  Ambulation/Gait                Stairs            Wheelchair  Mobility    Modified Rankin (Stroke Patients Only)       Balance Overall balance assessment: Needs assistance Sitting-balance support: Feet supported;Bilateral upper extremity supported Sitting balance-Leahy Scale: Poor Sitting balance - Comments: Requires Mod-Min A for sitting EOB esp when fatigued. Able to sit EOB unsupported for a few minutes. Lots of secretions.   Standing balance support: During functional activity Standing balance-Leahy Scale: Poor Standing balance comment: Relient on Mod A for standing balance.                              Pertinent Vitals/Pain Pain Assessment: No/denies pain    Home Living Family/patient expects to be discharged to:: Private residence Living Arrangements: Spouse/significant other;Parent;Other relatives Available Help at Discharge: Family Type of Home: House Home Access: Stairs to enter Entrance Stairs-Rails: Right Entrance Stairs-Number of Steps: 4   Home Equipment: Cane - single point;Walker - 2 wheels      Prior Function Level of Independence: Independent         Comments: Drives.      Hand Dominance        Extremity/Trunk Assessment   Upper Extremity Assessment: Defer to OT evaluation;Generalized weakness           Lower Extremity Assessment: Generalized weakness         Communication   Communication: Tracheostomy  Cognition Arousal/Alertness: Awake/alert Behavior During Therapy: WFL for tasks assessed/performed Overall Cognitive Status: Difficult to assess  General Comments      Exercises        Assessment/Plan    PT Assessment Patient needs continued PT services  PT Diagnosis Difficulty walking;Generalized weakness   PT Problem List Decreased strength;Cardiopulmonary status limiting activity;Decreased activity tolerance;Decreased balance;Decreased mobility  PT Treatment Interventions Balance training;Gait training;Stair training;Functional mobility  training;Therapeutic activities;Therapeutic exercise;Patient/family education   PT Goals (Current goals can be found in the Care Plan section) Acute Rehab PT Goals Patient Stated Goal: get out of this bed PT Goal Formulation: With patient Time For Goal Achievement: 07/09/15 Potential to Achieve Goals: Good    Frequency Min 3X/week   Barriers to discharge Inaccessible home environment steps to enter home    Co-evaluation               End of Session   Activity Tolerance: Patient tolerated treatment well;Patient limited by fatigue Patient left: in chair;with call bell/phone within reach;with nursing/sitter in room Nurse Communication: Mobility status;Need for lift equipment         Time: 0915-0940 PT Time Calculation (min) (ACUTE ONLY): 25 min   Charges:   PT Evaluation $Initial PT Evaluation Tier I: 1 Procedure PT Treatments $Therapeutic Activity: 8-22 mins   PT G Codes:        Sayre Witherington A Taysia Rivere 06/25/2015, 10:25 AM Mylo RedShauna Keanu Frickey, PT, DPT [phone]

## 2015-06-25 NOTE — Care Management Note (Signed)
Case Management Note Previous CM note initiated by Avie ArenasSarah Brown RN CM  Patient Details  Name: Derrick HansenReginald A Releford MRN: [id number] Date of Birth: 11/18/1980  Subjective/Objective:    No insurance listed, however states has insurance - card at home - depending on insurance may by Ltach candidate post trach for weaning.  Discussed with wife to bring insurance cards in. Delfina RedwoodSarah J. Brown  06-17-15  06-18-15 Again talked with wife, she did visit yesterday, does not know where cards are, talked with Mom and patient in room.  State it is a [employer] policy but don't know where the information is.  Will try to locate over the weekend.  Discussed why we are needing it, to see if insurance does have an Ltach benefit.  Mom tearful, but understands that if he gets trached and has the benefit this would be the place to go for hopeful vent liberation.   06-21-15 Talked with Mom about insurance - they could not find any cards over the weekend.  Mom feels he has insurance.  Called our financial counselor and she states that [facility] is no longer active.  Updated Mom.  Patient very agitated.  Per Artistfinancial counselor, if wife eligible for Medicaid due to small child in house - he could be on hers.  Informed mother of this.  Plan for trach tomorrow, would be good Ltach candidate.    06/23/15- Donn PieriniKristi Rowynn Mcweeney RN, BSN Spoke with pt's wife multiple times regarding insurance-with [employer]- per wife- pt was added to plan on Nov. 1- has sent benefit confirmation info- and [facility] been able to [facility] insurance on pt- has been placed in Epic- have spoken with both LTACH liaisons to see is pt has LTACH benefits- will f/u with wife on Friday- due to Holiday- may be Monday before benfits are known on LTACH option.                Action/Plan:   Expected Discharge Date:                Expected Discharge Plan:  Long Term Acute Care (LTAC)  In-House Referral:     Discharge planning Services  CM Consult  Post  Acute Care Choice:    Choice offered to:     DME Arranged:    DME Agency:     HH Arranged:    HH Agency:     Status of Service:  In process, will continue to follow  Medicare Important Message Given:    Date Medicare IM Given:    Medicare IM give by:    Date Additional Medicare IM Given:    Additional Medicare Important Message give by:     If discussed at Long Length of Stay Meetings, dates discussed:    Additional Comments:  06/25/15- 1100- Donn PieriniKristi Rocquel Askren RN BSN -  Call made to wife to f/u on discharge option discussion- pt now tx to SDU- discussed with wife- PT recommendation for CIR however pt still on vent- and LTACH seems the best option for pt at this time- gave wife info on both [facility] options [facility]- pt's wife states that she would prefer to continue to look at [facility] (they were looking at pt when pt thought to be a charity case)- [facility] liaison is looking into insurance benefits and should know something Monday due to the holiday. Have updated 2C -Case Manager- [address]Henrietta Mayo  Donn PieriniWebster, Ariyonna Twichell [place] GatewayHall, [place]RN 06/25/2015, 11:16 AM

## 2015-06-25 NOTE — Progress Notes (Signed)
Rehab Admissions Coordinator Note:  Patient was screened by Trish MageLogue, Sharika Mosquera M for appropriateness for an Inpatient Acute Rehab Consult.  At this time, we are recommending LTACH.  It is unlikely that [facility] an acute inpatient rehab admission given current diagnosis.  Call me for questions.    Trish MageLogue, Audriana Aldama M 06/25/2015, 11:33 AM  I can be reached at [phone].

## 2015-06-25 NOTE — Progress Notes (Addendum)
Report called to 2c06 RN and patient and family informed of room move Pt moved to 2c06 at 1020

## 2015-06-25 NOTE — Progress Notes (Signed)
Speech Language Pathology Treatment: Dysphagia  Patient Details Name: Derrick HansenReginald A [name] MRN: [id number] DOB: September 26, 1980 Today's Date: 06/25/2015 Time: 1040-1100 SLP Time Calculation (min) (ACUTE ONLY): 20 min  Assessment / Plan / Recommendation Clinical Impression  Pt seen with RT for in-line PMSV placement, now on CPAP/PS, tolerated well with similar function. Stable vital signs, low breathy voice. Pt increased volume with min verbal cues at word level. SLP reinforced 'chunking' in short phrases/sentences. Trials of ice chips tolerated well, but trials of puree eventually resulted in slightly wet vocal quality. Pt will need FEES, but plan today is to attempt trach collar eventually. Would recommend pt continue to rely on NG for nutrition as assesment on ATC will be more consistent and reliable.    HPI HPI: 34 year old male with morbid obesity, OSA and OHV who presents to PCCM with AMS. ABG was ordered and patient was noted to be hypercarbic and with respiratory acidosis. Subsequently the patient developed hypoxemia as well. Patient was transferred to the ICU and BiPAP was started. F/U ABG post BiPAP showed no evidence of improvement and mental status continued to deteriorate so decision was made to intubate. Pt was intubated 11/13-11/22, at which time trach was placed.      SLP Plan  Continue with current plan of care     Recommendations  Diet recommendations: NPO      Patient may use Passy-Muir Speech Valve: with SLP only PMSV Supervision: Full       Plan: Continue with current plan of care  [facility] Coast Treatment CenterBonnie Elgie Landino, MA CCC-SLP [phone]  Derrick [name], Derrick [name] 06/25/2015, 11:33 AM

## 2015-06-26 ENCOUNTER — Encounter (HOSPITAL_COMMUNITY): Payer: Self-pay | Admitting: Internal Medicine

## 2015-06-26 DIAGNOSIS — G4733 Obstructive sleep apnea (adult) (pediatric): Secondary | ICD-10-CM

## 2015-06-26 DIAGNOSIS — I272 Pulmonary hypertension, unspecified: Secondary | ICD-10-CM | POA: Diagnosis present

## 2015-06-26 DIAGNOSIS — R1310 Dysphagia, unspecified: Secondary | ICD-10-CM | POA: Diagnosis present

## 2015-06-26 DIAGNOSIS — I5033 Acute on chronic diastolic (congestive) heart failure: Secondary | ICD-10-CM | POA: Diagnosis present

## 2015-06-26 DIAGNOSIS — G9340 Encephalopathy, unspecified: Secondary | ICD-10-CM | POA: Diagnosis present

## 2015-06-26 LAB — GLUCOSE, CAPILLARY
GLUCOSE-CAPILLARY: 185 mg/dL — AB (ref 65–99)
GLUCOSE-CAPILLARY: 215 mg/dL — AB (ref 65–99)
Glucose-Capillary: 235 mg/dL — ABNORMAL HIGH (ref 65–99)
Glucose-Capillary: 182 mg/dL — ABNORMAL HIGH (ref 65–99)
Glucose-Capillary: 180 mg/dL — ABNORMAL HIGH (ref 65–99)
Glucose-Capillary: 209 mg/dL — ABNORMAL HIGH (ref 65–99)

## 2015-06-26 LAB — BASIC METABOLIC PANEL
ANION GAP: 4 — AB (ref 5–15)
BUN: 26 mg/dL — AB (ref 6–20)
CO2: 38 mmol/L — ABNORMAL HIGH (ref 22–32)
Calcium: 9 mg/dL (ref 8.9–10.3)
Chloride: 103 mmol/L (ref 101–111)
Creatinine, Ser: 0.89 mg/dL (ref 0.61–1.24)
GFR calc Af Amer: >60 mL/min (ref >60)
Glucose, Bld: 228 mg/dL — ABNORMAL HIGH (ref 65–99)
POTASSIUM: 3.3 mmol/L — AB (ref 3.5–5.1)
SODIUM: 145 mmol/L (ref 135–145)

## 2015-06-26 LAB — CBC
HCT: 35.9 % — ABNORMAL LOW (ref 39.0–52.0)
Hemoglobin: 10.8 g/dL — ABNORMAL LOW (ref 13.0–17.0)
MCH: 28.4 pg (ref 26.0–34.0)
MCHC: 30.1 g/dL (ref 30.0–36.0)
MCV: 94.5 fL (ref 78.0–100.0)
PLATELETS: 213 10*3/uL (ref 150–400)
RBC: 3.8 MIL/uL — AB (ref 4.22–5.81)
RDW: 13.7 % (ref 11.5–15.5)
WBC: 9.1 10*3/uL (ref 4.0–10.5)

## 2015-06-26 LAB — POTASSIUM: Potassium: 4 mmol/L (ref 3.5–5.1)

## 2015-06-26 LAB — MAGNESIUM: Magnesium: 1.9 mg/dL (ref 1.7–2.4)

## 2015-06-26 MED ORDER — POTASSIUM CHLORIDE 20 MEQ/15ML (10%) PO SOLN
40.0000 meq | Freq: Once | ORAL | Status: AC
  Administered 2015-06-26: 40 meq via ORAL

## 2015-06-26 MED ORDER — METHADONE HCL 5 MG PO TABS
2.5000 mg | ORAL_TABLET | Freq: Three times a day (TID) | ORAL | Status: DC
  Administered 2015-06-26 – 2015-06-28 (×6): 2.5 mg via ORAL
  Filled 2015-06-26 (×6): qty 1

## 2015-06-26 MED ORDER — INSULIN GLARGINE 100 UNIT/ML ~~LOC~~ SOLN
35.0000 [IU] | Freq: Every day | SUBCUTANEOUS | Status: DC
  Administered 2015-06-26: 35 [IU] via SUBCUTANEOUS
  Filled 2015-06-26 (×2): qty 0.35

## 2015-06-26 MED ORDER — POTASSIUM CHLORIDE 20 MEQ/15ML (10%) PO SOLN
ORAL | Status: AC
  Filled 2015-06-26: qty 30

## 2015-06-26 MED ORDER — POTASSIUM CHLORIDE 10 MEQ/100ML IV SOLN: 10.0000 meq | INTRAVENOUS | Status: DC

## 2015-06-26 MED ORDER — INSULIN ASPART 100 UNIT/ML ~~LOC~~ SOLN
0.0000 [IU] | SUBCUTANEOUS | Status: DC
  Administered 2015-06-26: 4 [IU] via SUBCUTANEOUS
  Administered 2015-06-26: 7 [IU] via SUBCUTANEOUS
  Administered 2015-06-26: 4 [IU] via SUBCUTANEOUS
  Administered 2015-06-27: 7 [IU] via SUBCUTANEOUS
  Administered 2015-06-27 (×2): 4 [IU] via SUBCUTANEOUS
  Administered 2015-06-27 – 2015-06-28 (×5): 7 [IU] via SUBCUTANEOUS
  Administered 2015-06-28 (×2): 4 [IU] via SUBCUTANEOUS
  Administered 2015-06-28 (×2): 3 [IU] via SUBCUTANEOUS
  Administered 2015-06-28: 7 [IU] via SUBCUTANEOUS
  Administered 2015-06-29: 4 [IU] via SUBCUTANEOUS
  Administered 2015-06-29: 3 [IU] via SUBCUTANEOUS
  Administered 2015-06-29 – 2015-06-30 (×3): 4 [IU] via SUBCUTANEOUS
  Administered 2015-06-30: 3 [IU] via SUBCUTANEOUS

## 2015-06-26 MED ORDER — FREE WATER
200.0000 mL | Freq: Four times a day (QID) | Status: DC
  Administered 2015-06-26 – 2015-06-27 (×4): 200 mL

## 2015-06-26 MED ORDER — FUROSEMIDE 10 MG/ML IJ SOLN
40.0000 mg | Freq: Every day | INTRAMUSCULAR | Status: DC
  Administered 2015-06-26 – 2015-06-27 (×2): 40 mg via INTRAVENOUS
  Filled 2015-06-26 (×2): qty 4

## 2015-06-26 NOTE — Progress Notes (Signed)
Subjective: Off vent. On trach collar.  Objective: Vital signs in last 24 hours: Temp:  [98.6 F (37 C)-100.3 F (37.9 C)] 98.9 F (37.2 C) (11/26 0801) Pulse Rate:  [88-117] 88 (11/26 0801) Resp:  [14-37] 14 (11/26 0801) BP: (120-158)/(72-98) 120/72 mmHg (11/26 0801) SpO2:  [88 %-98 %] 98 % (11/26 0801) FiO2 (%):  [35 %-60 %] 60 % (11/26 0900) Weight:  [219.087 kg (483 lb)] 219.087 kg (483 lb) (11/26 0420)  PHYSICAL EXAMINATION: General: Morbidly obese male. On trach collar. Neuro:Sleeping. Neck: Obese. Trach in place. No bleeding. Oral: Normal mucosa. Lungs: Good air movement over the anterior chest wall bilaterally.    Recent Labs  06/25/15 0437 06/26/15 0530  WBC 7.9 9.1  HGB 10.8* 10.8*  HCT 36.9* 35.9*  PLT 205 213    Recent Labs  06/25/15 0437 06/26/15 0530  NA 144 145  K 3.5 3.3*  CL 101 103  CO2 38* 38*  GLUCOSE 291* 228*  BUN 30* 26*  CREATININE 0.95 0.89  CALCIUM 8.9 9.0    Medications:  I have reviewed the patient's current medications. Scheduled: . alteplase  2 mg Intracatheter Once  . cefTRIAXone (ROCEPHIN)  IV  1 g Intravenous Q24H  . chlorhexidine  15 mL Mouth Rinse BID  . feeding supplement (VITAL HIGH PROTEIN)  1,000 mL Per Tube Q24H  . heparin subcutaneous  5,000 Units Subcutaneous 3 times per day  . insulin aspart  0-20 Units Subcutaneous 6 times per day  . insulin aspart  6 Units Subcutaneous 6 times per day  . insulin glargine  30 Units Subcutaneous QHS  . loratadine  10 mg Oral Daily  . methadone  5 mg Oral 3 times per day  . pantoprazole sodium  40 mg Per Tube Q24H  . polyethylene glycol  17 g Oral Daily  . potassium chloride  10 mEq Intravenous Q1 Hr x 4  . risperiDONE  2 mg Oral BID   FAO:[id number]:bisacodyl, fentaNYL (SUBLIMAZE) injection, LORazepam, [DISCONTINUED] ondansetron **OR** ondansetron (ZOFRAN) IV, sennosides  Assessment/Plan: POD #5 s/p trach. Doing well, off vent. Will change trach next week.    LOS: 14 days    Micaila Ziemba,SUI W 06/26/2015, 9:31 AM

## 2015-06-26 NOTE — Progress Notes (Signed)
SLP Cancellation Note  Patient Details Name: Mitzi HansenReginald A Gibeault MRN: [id number] DOB: 12/15/1980   Cancelled treatment:       Reason Eval/Treat Not Completed: Patient's level of consciousness. Spoke with patient's RN at 1355, who had recently given patient Ativan. Patient is receiving NG tube feeds and primary SLP is recommending a FEES vs. BSE prior to recommending P.O.'s    Angela NevinJohn T. Lejuan Botto, MA, CCC-SLP 06/26/2015 4:05 PM

## 2015-06-26 NOTE — Progress Notes (Signed)
Derrick [name] - Stepdown/ICU TEAM Progress Note  Derrick [name] [id number] DOB: [birth year] DOA: 06/12/2015 PCP: [facility]  Admit HPI / Brief Narrative: 34 year BM PMHx DM type 2 Uncontrolled, HTN with Morbid Obesity, OSA/OHS   Presents to PCCM with AMS. ABG was ordered and patient was noted to be hypercarbic and with respiratory acidosis. Subsequently the patient developed hypoxemia as well. Patient was transferred to the ICU and BiPAP was started. F/U ABG post BiPAP showed no evidence of improvement and mental status continued to deteriorate so decision was made to intubate.   HPI/Subjective: 11/26 patient will open his eyes and answer all questions with nods of his head, follows all commands.  Assessment/Plan: Acute hypoxemic and hypercarbic respiratory failure/VDRF -Multifactorial including VAP, CHF/pulm edema, OSA, OHS, Rt-lung collapse S/P lavage on 11/22 - resolved 11/24 -vent management per pulmonary/CCM -continue ceftriaxone--total antibiotics day #5 -WBC improved -fever curve improving -06/20/2015 Blood cultures remain negative  Acute on chronic diastolic CHF/Pulmonary Hypertension -Strict in and out since admission +2.8 L -Daily weight bed; 11/26= 219 kg -06/14/2015 echocardiogram. 55-60%, grade 2 diastolic dysfunction -Lasix 40 mg daily  Acute kidney injury -Serum creatinine peaked at 2.11 -Resolved  Diabetes mellitus type 2 -06/13/2015 Hemoglobin A1c =10.[name] -Increase Lantus 35 units daily -Resistant SSI  Acute encephalopathy -Multifactorial including ICU delirium, metabolic derangement, infection, hypercarbia, medications -Appears to have resolved as patient is following all commands and nodding yes and no to questions. -Decrease Methadone to 2.5 mg TID and monitor QTC -Continue Risperdal 2 mg BID  Dysphagia -Patient will require Fiberoptic endoscopic evaluation of swallowing(FEES ) will be performed today or  tomorrow -If patient passes will remove NG tube  Hypernatremia -Resolved (upper limits of normal) -200 ml free water QID via NG tube  Nutrition, aspiration  Per NG tube  DM Type 2 Uncontrolled.  A1c this hospitalization was 10.[name] - resistant SSI  - Novolog to 6 units q4hrs while on tube feeds - Lantus to 30   Hypokalemia  -Potassium IV 40 mEq -Recheck potassium/magnesium 1400     Code Status: FULL Family Communication:  family present at time of exam Disposition Plan: Resolution respiratory failure    Consultants:   Procedure/Significant Events: CXR 11/13- with cardiomegally and pulmonary edema. Venous doppler 11/13- without DVT, superficial thrombosis, or Baker's cyst CXR 11/14- Hypoventilation with bibasilar atelectasis.  TTE 11/14- Mod concentric LV hypertrophy, EF 55-60%, grade 2 diastolic dysfunction, can't assess PV.  CXR 11/15- Slight interval deterioration in the pulmonary interstitium with increased pulmonary vascular prominence. Developing left lower lobe atelectasis is suspected.  CXR 11/17- Worsening pulmonary edema.  CXR 11/18- Slight improvement in pulmonary vascular congestion. Persistent bibasilar atelectasis or pneumonia CXR 11/19- Persistent mild lung base atelectasis and small effusions without pulmonary edema. CXR 11/21- Low lung vol, bibasilar atelectasis and small effusions Bronchoscopy 11/22- Impressive thin grey secretions with complete collapse rt main and all lobes rt and partial 50% left main  SIGNIFICANT EVENTS: 11/13 intubation for hypercarbic/hypoxic respiratory failure. 11/14- transitioned from Lovenox gtt to heparin gtt 11/15- removed PA catheter and replaced with CVL, decrease Lasix gtt  11/17- transitioned to intermittent Lasix 11/20- T 102.5, pan culture and Levaquin  11/22- bronchoscopy, tracheostomy in OR by ENT 11/23- severe delirium  11/24- improved with new cocktail neuro, off drips   Culture Blood cx  11/20> Urine cx 11/20> NGTD  BAL 11/22> NF   Antibiotics: Ceftaz 11/22>>11/24 Linazolid 11/22>>11/23 Levaquin 11/20>> 11/22 Ceftriaxone to 11/24 to stop date  DVT prophylaxis: Subcutaneous heparin   Devices Tracheostomy in place   LINES / TUBES:  OETT 11/13>>>11/22 R IJ PAC 11/13>>>11/5, replaced on 11/15 with CVL Trach 11/22>>  Continuous Infusions: . sodium chloride 10 mL/hr at 06/21/15 1936    Objective: VITAL SIGNS: Temp: 98.5 F (36.9 C) (11/26 1348) Temp Source: Axillary (11/26 1348) BP: 138/87 mmHg (11/26 1348) Pulse Rate: 103 (11/26 1348) SPO2; FIO2:   Intake/Output Summary (Last 24 hours) at 06/26/15 1916 Last data filed at 06/26/15 1600  Gross per 24 hour  Intake   1325 ml  Output   1250 ml  Net     75 ml     Exam: General: patient will open his eyes and answer all questions with nods of his head, follows all commands.positive acute respiratory distress (ventilator dependent), now on 10 L blow-by Eyes: negative scleral hemorrhage ENT: Negative Runny negative gingival bleeding, Neck:  Negative scars, masses, torticollis, lymphadenopathy, JVD, trachea present without discharge or signs of infection Lungs: Clear to auscultation bilaterally without wheezes or crackles Cardiovascular: Regular rate and rhythm without murmur gallop or rub normal S1 and S2 Abdomen: Morbidly Morbidly obese, negative abdominal pain,positive soft, bowel sounds, no rebound, no ascites, no appreciable mass Extremities: No significant cyanosis, clubbing, or edema bilateral lower extremities Psychiatric:  Negative depression, negative anxiety, negative fatigue, negative mania  Neurologic:  Cranial nerves II through XII intact, tongue/uvula midline, all extremities muscle strength 5/5, sensation intact throughout, unable to assess dysarthria, negative expressive aphasia, negative receptive aphasia.   Data Reviewed: Basic Metabolic Panel:  Recent Labs Lab 06/23/15 1530  06/24/15 0340 06/24/15 1559 06/25/15 0437 06/26/15 0530 06/26/15 1600  NA 147* 146* 145 144 145  --   K 3.4* 3.6 3.7 3.5 3.3* 4.0  CL 103 102 102 101 103  --   CO2 37* 38* 35* 38* 38*  --   GLUCOSE 230* 235* 291* 291* 228*  --   BUN 56* 41* 34* 30* 26*  --   CREATININE [name].56* [name].22 0.98 0.95 0.89  --   CALCIUM 8.9 9.0 8.9 8.9 9.0  --   MG  --   --   --   --   --  [name].9   Liver Function Tests: No results for input(s): AST, ALT, ALKPHOS, BILITOT, PROT, ALBUMIN in the last 168 hours. No results for input(s): LIPASE, AMYLASE in the last 168 hours. No results for input(s): AMMONIA in the last 168 hours. CBC:  Recent Labs Lab 06/22/15 0413 06/23/15 0409 06/24/15 0340 06/25/15 0437 06/26/15 0530  WBC 13.4* 13.4* 10.[name] 7.9 9.[name]  NEUTROABS  --   --  7.4  --   --   HGB 12.3* 10.9* 10.9* 10.8* 10.8*  HCT 42.2 37.9* 36.6* 36.9* 35.9*  MCV 97.0 97.4 94.6 94.9 94.5  PLT 242 202 216 205 213   Cardiac Enzymes: No results for input(s): CKTOTAL, CKMB, CKMBINDEX, TROPONINI in the last 168 hours. BNP (last 3 results)  Recent Labs  06/12/15 1539  BNP 86.7    ProBNP (last 3 results) No results for input(s): PROBNP in the last 8760 hours.  CBG:  Recent Labs Lab 06/26/15 0035 06/26/15 0419 06/26/15 0808 06/26/15 1242 06/26/15 1656  GLUCAP 215* 235* 182* 185* 180*    Recent Results (from the past 240 hour(s))  Culture, blood (routine x 2)     Status: None   Collection Time: 06/20/15  9:44 PM  Result Value Ref Range Status   Specimen Description BLOOD LEFT ANTECUBITAL  Final  Special Requests BOTTLES DRAWN AEROBIC AND ANAEROBIC 10CC  Final   Culture NO GROWTH 5 DAYS  Final   Report Status 06/25/2015 FINAL  Final  Culture, Urine     Status: None   Collection Time: 06/20/15  9:45 PM  Result Value Ref Range Status   Specimen Description URINE, CATHETERIZED  Final   Special Requests NONE  Final   Culture MULTIPLE SPECIES PRESENT, SUGGEST RECOLLECTION  Final   Report Status  06/22/2015 FINAL  Final  Culture, respiratory (NON-Expectorated)     Status: None   Collection Time: 06/20/15  9:48 PM  Result Value Ref Range Status   Specimen Description TRACHEAL ASPIRATE  Final   Special Requests NONE  Final   Gram Stain   Final    MODERATE WBC PRESENT, PREDOMINANTLY PMN NO SQUAMOUS EPITHELIAL CELLS SEEN RARE GRAM POSITIVE COCCI IN PAIRS IN CLUSTERS RARE GRAM NEGATIVE RODS RARE GRAM POSITIVE RODS    Culture   Final    Non-Pathogenic Oropharyngeal-type Flora Isolated. Performed at [facility]    Report Status 06/23/2015 FINAL  Final  Culture, blood (routine x 2)     Status: None   Collection Time: 06/20/15  9:49 PM  Result Value Ref Range Status   Specimen Description BLOOD RIGHT HAND  Final   Special Requests BOTTLES DRAWN AEROBIC ONLY 5CC  Final   Culture NO GROWTH 5 DAYS  Final   Report Status 06/25/2015 FINAL  Final  Culture, bal-quantitative     Status: None   Collection Time: 06/21/15 12:48 PM  Result Value Ref Range Status   Specimen Description BRONCHIAL ALVEOLAR LAVAGE  Final   Special Requests NONE  Final   Gram Stain   Final    ABUNDANT WBC PRESENT,BOTH PMN AND MONONUCLEAR NO SQUAMOUS EPITHELIAL CELLS SEEN FEW GRAM VARIABLE ROD RARE GRAM POSITIVE COCCI IN PAIRS    Colony Count   Final    >=100,000 COLONIES/ML Performed at [facility]    Culture   Final    Non-Pathogenic Oropharyngeal-type Flora Isolated. Performed at [facility]    Report Status 06/24/2015 FINAL  Final     Studies:  Recent x-ray studies have been reviewed in detail by the Attending Physician  Scheduled Meds:  Scheduled Meds: . alteplase  2 mg Intracatheter Once  . cefTRIAXone (ROCEPHIN)  IV  [name] g Intravenous Q24H  . chlorhexidine  15 mL Mouth Rinse BID  . feeding supplement (VITAL HIGH PROTEIN)  [name],000 mL Per Tube Q24H  . free water  200 mL Per Tube QID  . furosemide  40 mg Intravenous Daily  . heparin subcutaneous  5,000 Units  Subcutaneous 3 times per day  . insulin aspart  0-20 Units Subcutaneous 6 times per day  . insulin glargine  35 Units Subcutaneous QHS  . loratadine  10 mg Oral Daily  . methadone  2.5 mg Oral 3 times per day  . pantoprazole sodium  40 mg Per Tube Q24H  . polyethylene glycol  17 g Oral Daily  . risperiDONE  2 mg Oral BID    Time spent on care of this patient: 40 mins   WOODS, Roselind Messier , MD  [facility] Office  [phone] Pager [phone]  On-Call/Text Page:      [email or web address]      password [email or web address]  If 7PM-7AM, please contact night-coverage [email or web address] Password [email or web address] 06/26/2015, 7:16 PM   LOS: 14 days   Care during the described time interval was provided by me .  I have reviewed this patient's available data, including medical history, events of note, physical examination, and all test results as part of my evaluation. I have personally reviewed and interpreted all radiology studies.   Dia Crawford, MD [phone] Pager

## 2015-06-26 NOTE — Progress Notes (Signed)
Received call from RN regarding swallow eval, will plan for FEES assessment Monday.  Harlon DittyBonnie Amea Mcphail, MA CCC-SLP

## 2015-06-26 NOTE — Progress Notes (Signed)
PULMONARY / CRITICAL CARE MEDICINE   Name: Derrick HansenReginald A [name] MRN: [id number] DOB: 11-Aug-1980    ADMISSION DATE:  06/12/2015 CONSULTATION DATE:  06/13/2015  REFERRING MD :  TRH  CHIEF COMPLAINT:  Hypercarbic respiratory failure and AMS  INITIAL PRESENTATION: 34 year old male with morbid obesity, OSA and OHV who presents to PCCM with AMS.  ABG was ordered and patient was noted to be hypercarbic and with respiratory acidosis.  Subsequently the patient developed hypoxemia as well.  Patient was transferred to the ICU and BiPAP was started.  F/U ABG post BiPAP showed no evidence of improvement and mental status continued to deteriorate so decision was made to intubate.  STUDIES:  CXR 11/13- with cardiomegally and pulmonary edema. Venous doppler 11/13- without DVT, superficial thrombosis, or Baker's cyst CXR 11/14- Hypoventilation with bibasilar atelectasis.  TTE 11/14- Mod concentric LV hypertrophy, EF 55-60%, grade 2 diastolic dysfunction, can't assess PV.  CXR 11/15- Slight interval deterioration in the pulmonary interstitium with increased pulmonary vascular prominence. Developing left lower lobe atelectasis is suspected.  CXR 11/17- Worsening pulmonary edema.  CXR 11/18- Slight improvement in pulmonary vascular congestion. Persistent bibasilar atelectasis or pneumonia CXR 11/19- Persistent mild lung base atelectasis and small effusions without pulmonary edema. CXR 11/21- Low lung vol, bibasilar atelectasis and small effusions Bronchoscopy 11/22- Impressive thin grey secretions with complete collapse rt main and all lobes rt and partial 50% left main  SIGNIFICANT EVENTS: 11/13 intubation for hypercarbic/hypoxic respiratory failure. 11/14- transitioned from Lovenox gtt to heparin gtt 11/15- removed PA catheter and replaced with CVL, decrease Lasix gtt  11/17- transitioned to intermittent Lasix 11/20- T 102.5, pan culture and Levaquin  11/22- bronchoscopy, tracheostomy in OR by  ENT 11/23- severe delirium  11/24- improved with new cocktail neuro, off drips  SUBJECTIVE:   Lethargic, aroused to voice. SLP to supervise PMSV, feeding to continue per tube   VITAL SIGNS: Temp:  [98.6 F (37 C)-100.3 F (37.9 C)] 98.9 F (37.2 C) (11/26 0801) Pulse Rate:  [88-117] 88 (11/26 0801) Resp:  [14-37] 14 (11/26 0801) BP: (120-158)/(72-98) 120/72 mmHg (11/26 0801) SpO2:  [88 %-98 %] 98 % (11/26 0801) FiO2 (%):  [35 %-60 %] 60 % (11/26 0900) Weight:  [483 lb (219.087 kg)] 483 lb (219.087 kg) (11/26 0420) HEMODYNAMICS:   VENTILATOR SETTINGS: Vent Mode:  [-] PRVC FiO2 (%):  [35 %-60 %] 60 % Set Rate:  [14 bmp] 14 bmp Vt Set:  [580 mL] 580 mL PEEP:  [5 cmH20] 5 cmH20 Pressure Support:  [10 cmH20-18 cmH20] 10 cmH20 Plateau Pressure:  [23 cmH20] 23 cmH20 INTAKE / OUTPUT:  Intake/Output Summary (Last 24 hours) at 06/26/15 0935 Last data filed at 06/26/15 0900  Gross per 24 hour  Intake    790 ml  Output   1100 ml  Net   -310 ml    PHYSICAL EXAMINATION: General:  Obese male alert. Alert  Neuro:  Opens eyes, perrl, calm, cooperative Cardiovascular:  s1 s2 RRR distant Lungs: reduced. Now on T-collar, unlabored Abdomen:  Soft, obese, ND and +BS. Musculoskeletal:  No edema Skin:  Intact, no new rashes  LABS:  CBC  Recent Labs Lab 06/24/15 0340 06/25/15 0437 06/26/15 0530  WBC 10.1 7.9 9.1  HGB 10.9* 10.8* 10.8*  HCT 36.6* 36.9* 35.9*  PLT 216 205 213   Coag's  Recent Labs Lab 06/21/15 1211  INR 1.35   BMET  Recent Labs Lab 06/24/15 1559 06/25/15 0437 06/26/15 0530  NA 145 144 145  K  3.7 3.5 3.3*  CL 102 101 103  CO2 35* 38* 38*  BUN 34* 30* 26*  CREATININE 0.98 0.95 0.89  GLUCOSE 291* 291* 228*   Electrolytes  Recent Labs Lab 06/24/15 1559 06/25/15 0437 06/26/15 0530  CALCIUM 8.9 8.9 9.0   Sepsis Markers No results for input(s): LATICACIDVEN, PROCALCITON, O2SATVEN in the last 168 hours. ABG No results for input(s): PHART,  PCO2ART, PO2ART in the last 168 hours. Liver Enzymes No results for input(s): AST, ALT, ALKPHOS, BILITOT, ALBUMIN in the last 168 hours. Cardiac Enzymes No results for input(s): TROPONINI, PROBNP in the last 168 hours. Glucose  Recent Labs Lab 06/25/15 1231 06/25/15 1637 06/25/15 2016 06/26/15 0035 06/26/15 0419 06/26/15 0808  GLUCAP 303* 263* 204* 215* 235* 182*    Imaging No results found.   ASSESSMENT / PLAN:  PULMONARY OETT 11/13>>>11/22 Trach 11/22>> A: VDRF, hypercarbic, due to morbid obesity, OSA, OHV and CHF. PE less likely given history COLLAPSE RLL s/p broncho with lavage on 11/22 - resolved 11/24 Concerns for pneumonia VAP P:   - T-collar as tolerated -in line pmv per SLP -even balance goals  CARDIOVASCULAR R IJ PAC 11/13>>>11/5, replaced on 11/15 with CVL A: CHF exacerbation due to morbid obesity and likely pulmonary edema and pulmonary HTN. P:  -even goals  RENAL A:  Hypernatremia remains with rise crt P:   - d5w kvo -chem in am  GASTROINTESTINAL A:  Nutrition, aspiration TF in past? P:   -post pyloric feeding -slp noted, mayu need fees, consider this, secretions noted - Protonix daily  HEMATOLOGIC A:  Anemia  DVT ppx P:  - SQ heparin maintain - limit blood drws  INFECTIOUS A: Fevers overnight, leukocytosis: PNA, collapse - concern HCAP, R/O VAP P:   Blood cx 11/20> Urine cx 11/20> NGTD  BAL 11/22> NF  Ceftaz 11/22>>11/24 Linazolid 11/22>>11/23 Levaquin  11/20>> 11/22 Ceftriaxone to 11/24 to stop date   ENDOCRINE A:  DM.   A1c this hospitalization was 10.1 Hyperglycemia  P:   -  resistant SSI  -  Novolog to 6 units q4hrs while on tube feeds - Lantus to 30 and dc d5 to 10 cc/hr - CBGs.  NEUROLOGIC A:  AMS due to hypercarbia, severe agitation, delirium MAJOT better 11/24!!, failure to precedex P:   RASS goal: 0 Methadone 10 to q8h Reduce to daily  Risperdal to 2 mg BID - keep WUA  pt   CD Maple Hudson, MD [facility]  Pulmonary & Critical Care p- [phone] After 3:00 PM [phone] 06/26/2015 9:35 AM

## 2015-06-26 NOTE — Progress Notes (Signed)
@   0830 pt placed on 35% trach collar by RT Tolerated for about 25" than sats at 88 % encouraged pt to take a deep breath. Increased oxygen to 40 % waited 10 min sats still at 88 to 89 % increased oxygen to 60 % now sats are 92 to 93 % Pt is comfortable no resp distress rr 18

## 2015-06-27 DIAGNOSIS — I272 Other secondary pulmonary hypertension: Secondary | ICD-10-CM

## 2015-06-27 LAB — COMPREHENSIVE METABOLIC PANEL
ALT: 30 U/L (ref 17–63)
AST: 31 U/L (ref 15–41)
Albumin: 2.3 g/dL — ABNORMAL LOW (ref 3.5–5.0)
Alkaline Phosphatase: 67 U/L (ref 38–126)
Anion gap: 6 (ref 5–15)
BUN: 26 mg/dL — ABNORMAL HIGH (ref 6–20)
CO2: 38 mmol/L — ABNORMAL HIGH (ref 22–32)
Calcium: 9 mg/dL (ref 8.9–10.3)
Chloride: 101 mmol/L (ref 101–111)
Creatinine, Ser: 0.83 mg/dL (ref 0.61–1.24)
GFR calc Af Amer: >60 mL/min (ref >60)
GFR calc non Af Amer: >60 mL/min (ref >60)
Glucose, Bld: 216 mg/dL — ABNORMAL HIGH (ref 65–99)
Potassium: 3.8 mmol/L (ref 3.5–5.1)
Sodium: 145 mmol/L (ref 135–145)
Total Bilirubin: 0.2 mg/dL — ABNORMAL LOW (ref 0.3–1.2)
Total Protein: 6.9 g/dL (ref 6.5–8.1)

## 2015-06-27 LAB — CBC WITH DIFFERENTIAL/PLATELET
Basophils Absolute: 0 10*3/uL (ref 0.0–0.1)
Basophils Relative: 0 %
Eosinophils Absolute: 0.3 10*3/uL (ref 0.0–0.7)
Eosinophils Relative: 4 %
HCT: 36.4 % — ABNORMAL LOW (ref 39.0–52.0)
Hemoglobin: 10.9 g/dL — ABNORMAL LOW (ref 13.0–17.0)
Lymphocytes Relative: 23 %
Lymphs Abs: 2 10*3/uL (ref 0.7–4.0)
MCH: 28.5 pg (ref 26.0–34.0)
MCHC: 29.9 g/dL — ABNORMAL LOW (ref 30.0–36.0)
MCV: 95 fL (ref 78.0–100.0)
Monocytes Absolute: 0.5 10*3/uL (ref 0.1–1.0)
Monocytes Relative: 5 %
Neutro Abs: 6 10*3/uL (ref 1.7–7.7)
Neutrophils Relative %: 68 %
Platelets: 238 10*3/uL (ref 150–400)
RBC: 3.83 MIL/uL — ABNORMAL LOW (ref 4.22–5.81)
RDW: 13.6 % (ref 11.5–15.5)
WBC: 8.8 10*3/uL (ref 4.0–10.5)

## 2015-06-27 LAB — MAGNESIUM: Magnesium: 1.8 mg/dL (ref 1.7–2.4)

## 2015-06-27 LAB — GLUCOSE, CAPILLARY
Glucose-Capillary: 159 mg/dL — ABNORMAL HIGH (ref 65–99)
Glucose-Capillary: 191 mg/dL — ABNORMAL HIGH (ref 65–99)
Glucose-Capillary: 205 mg/dL — ABNORMAL HIGH (ref 65–99)
Glucose-Capillary: 206 mg/dL — ABNORMAL HIGH (ref 65–99)
Glucose-Capillary: 209 mg/dL — ABNORMAL HIGH (ref 65–99)
Glucose-Capillary: 233 mg/dL — ABNORMAL HIGH (ref 65–99)

## 2015-06-27 MED ORDER — FUROSEMIDE 10 MG/ML IJ SOLN
40.0000 mg | Freq: Two times a day (BID) | INTRAMUSCULAR | Status: DC
  Administered 2015-06-27 – 2015-06-29 (×4): 40 mg via INTRAVENOUS
  Filled 2015-06-27 (×4): qty 4

## 2015-06-27 MED ORDER — FREE WATER
100.0000 mL | Freq: Four times a day (QID) | Status: DC
  Administered 2015-06-27 – 2015-06-28 (×4): 100 mL

## 2015-06-27 MED ORDER — INSULIN GLARGINE 100 UNIT/ML ~~LOC~~ SOLN
45.0000 [IU] | Freq: Every day | SUBCUTANEOUS | Status: DC
Start: 2015-06-27 — End: 2015-06-30
  Administered 2015-06-27 – 2015-06-29 (×3): 45 [IU] via SUBCUTANEOUS
  Filled 2015-06-27 (×4): qty 0.45

## 2015-06-27 NOTE — Progress Notes (Signed)
PULMONARY / CRITICAL CARE MEDICINE   Name: Derrick HansenReginald A [name] MRN: [id number] DOB: 11-18-1980    ADMISSION DATE:  06/12/2015 CONSULTATION DATE:  06/13/2015  REFERRING MD :  TRH  CHIEF COMPLAINT:  Hypercarbic respiratory failure and AMS  INITIAL PRESENTATION: 34 year old male with morbid obesity, OSA and OHV who presents to PCCM with AMS.  ABG was ordered and patient was noted to be hypercarbic and with respiratory acidosis.  Subsequently the patient developed hypoxemia as well.  Patient was transferred to the ICU and BiPAP was started.  F/U ABG post BiPAP showed no evidence of improvement and mental status continued to deteriorate so decision was made to intubate.  STUDIES:  CXR 11/13- with cardiomegally and pulmonary edema. Venous doppler 11/13- without DVT, superficial thrombosis, or Baker's cyst CXR 11/14- Hypoventilation with bibasilar atelectasis.  TTE 11/14- Mod concentric LV hypertrophy, EF 55-60%, grade 2 diastolic dysfunction, can't assess PV.  CXR 11/15- Slight interval deterioration in the pulmonary interstitium with increased pulmonary vascular prominence. Developing left lower lobe atelectasis is suspected.  CXR 11/17- Worsening pulmonary edema.  CXR 11/18- Slight improvement in pulmonary vascular congestion. Persistent bibasilar atelectasis or pneumonia CXR 11/19- Persistent mild lung base atelectasis and small effusions without pulmonary edema. CXR 11/21- Low lung vol, bibasilar atelectasis and small effusions Bronchoscopy 11/22- Impressive thin grey secretions with complete collapse rt main and all lobes rt and partial 50% left main  SIGNIFICANT EVENTS: 11/13 intubation for hypercarbic/hypoxic respiratory failure. 11/14- transitioned from Lovenox gtt to heparin gtt 11/15- removed PA catheter and replaced with CVL, decrease Lasix gtt  11/17- transitioned to intermittent Lasix 11/20- T 102.5, pan culture and Levaquin  11/22- bronchoscopy, tracheostomy in OR by  ENT 11/23- severe delirium  11/24- improved with new cocktail neuro, off drips  SUBJECTIVE:   Lethargic, aroused to voice. SLP to supervise PMSV, feeding to continue per tube   VITAL SIGNS: Temp:  [98.5 F (36.9 C)-99.3 F (37.4 C)] 99.2 F (37.3 C) (11/27 0822) Pulse Rate:  [92-103] 95 (11/27 0822) Resp:  [14-27] 16 (11/27 0822) BP: (113-146)/(66-122) 128/78 mmHg (11/27 0822) SpO2:  [94 %-100 %] 94 % (11/27 1115) FiO2 (%):  [40 %-60 %] 60 % (11/27 1115) Weight:  [215.459 kg (475 lb)] 215.459 kg (475 lb) (11/27 0500) HEMODYNAMICS:   VENTILATOR SETTINGS: Vent Mode:  [-] PRVC FiO2 (%):  [40 %-60 %] 60 % Set Rate:  [14 bmp] 14 bmp Vt Set:  [580 mL] 580 mL PEEP:  [5 cmH20] 5 cmH20 Plateau Pressure:  [25 cmH20-27 cmH20] 25 cmH20 INTAKE / OUTPUT:  Intake/Output Summary (Last 24 hours) at 06/27/15 1122 Last data filed at 06/27/15 0900  Gross per 24 hour  Intake 2631.5 ml  Output    975 ml  Net 1656.5 ml    PHYSICAL EXAMINATION: General:  Obese male alert. Alert once awakened Neuro:  Opens eyes, perrl, calm, cooperative Cardiovascular:  s1 s2 RRR distant Lungs: reduced. Now on T-collar, unlabored Abdomen:  Soft, obese, ND and +BS. Musculoskeletal:  No edema Skin:  Intact, no new rashes  LABS:  CBC  Recent Labs Lab 06/25/15 0437 06/26/15 0530 06/27/15 0236  WBC 7.9 9.1 8.8  HGB 10.8* 10.8* 10.9*  HCT 36.9* 35.9* 36.4*  PLT 205 213 238   Coag's  Recent Labs Lab 06/21/15 1211  INR 1.35   BMET  Recent Labs Lab 06/25/15 0437 06/26/15 0530 06/26/15 1600 06/27/15 0236  NA 144 145  --  145  K 3.5 3.3* 4.0 3.8  CL 101 103  --  101  CO2 38* 38*  --  38*  BUN 30* 26*  --  26*  CREATININE 0.95 0.89  --  0.83  GLUCOSE 291* 228*  --  216*   Electrolytes  Recent Labs Lab 06/25/15 0437 06/26/15 0530 06/26/15 1600 06/27/15 0236  CALCIUM 8.9 9.0  --  9.0  MG  --   --  1.9 1.8   Sepsis Markers No results for input(s): LATICACIDVEN, PROCALCITON,  O2SATVEN in the last 168 hours. ABG No results for input(s): PHART, PCO2ART, PO2ART in the last 168 hours. Liver Enzymes  Recent Labs Lab 06/27/15 0236  AST 31  ALT 30  ALKPHOS 67  BILITOT 0.2*  ALBUMIN 2.3*   Cardiac Enzymes No results for input(s): TROPONINI, PROBNP in the last 168 hours. Glucose  Recent Labs Lab 06/26/15 1242 06/26/15 1656 06/26/15 2011 06/27/15 0056 06/27/15 0519 06/27/15 0851  GLUCAP 185* 180* 209* 191* 209* 233*    Imaging No results found.   ASSESSMENT / PLAN:  PULMONARY OETT 11/13>>>11/22 Trach 11/22>> A: VDRF, hypercarbic, due to morbid obesity, OSA, OHV and CHF. PE less likely given history COLLAPSE RLL s/p broncho with lavage on 11/22 - resolved 11/24 Concerns for pneumonia VAP P:   - T-collar as tolerated -in line pmv per SLP -even balance goals  CARDIOVASCULAR R IJ PAC 11/13>>>11/5, replaced on 11/15 with CVL A: CHF exacerbation due to morbid obesity and likely pulmonary edema and pulmonary HTN. P:  -even goals  RENAL A:  Hypernatremia remains with rise crt P:   - d5w kvo -chem in am  GASTROINTESTINAL A:  Nutrition, aspiration TF in past? P:   -post pyloric feeding -slp noted, mayu need fees, consider this, secretions noted - Protonix daily  HEMATOLOGIC A:  Anemia  DVT ppx P:  - SQ heparin maintain - limit blood drws  INFECTIOUS A: Fevers overnight, leukocytosis: PNA, collapse - concern HCAP, R/O VAP P:   Blood cx 11/20> Urine cx 11/20> NGTD  BAL 11/22> NF  Ceftaz 11/22>>11/24 Linazolid 11/22>>11/23 Levaquin  11/20>> 11/22 Ceftriaxone to 11/24 to stop date   ENDOCRINE A:  DM.   A1c this hospitalization was 10.1 Hyperglycemia  P:   -  resistant SSI  -  Novolog to 6 units q4hrs while on tube feeds - Lantus to 30 and dc d5 to 10 cc/hr - CBGs.  NEUROLOGIC A:  AMS due to hypercarbia, severe agitation, delirium better 11/24!!, failure to precedex P:   RASS goal: 0 Methadone 10 to q8h Reduce to  daily  Risperdal to 2 mg BID - keep WUA  pt   CD Maple Hudson, MD [facility] Pulmonary & Critical Care p- [phone] After 3:00 PM [phone] 06/27/2015 11:22 AM

## 2015-06-27 NOTE — Progress Notes (Signed)
[place] TEAM 1 - Stepdown/ICU TEAM Progress Note  Derrick [name]:[id number] DOB: [birth year] DOA: 06/12/2015 PCP: [facility]  Admit HPI / Brief Narrative: 34 year BM PMHx DM type 2 Uncontrolled, HTN with Morbid Obesity, OSA/OHS   Presents to PCCM with AMS. ABG was ordered and patient was noted to be hypercarbic and with respiratory acidosis. Subsequently the patient developed hypoxemia as well. Patient was transferred to the ICU and BiPAP was started. F/U ABG post BiPAP showed no evidence of improvement and mental status continued to deteriorate so decision was made to intubate.   HPI/Subjective: 11/27 patient will open his eyes and answer all questions with nods of his head, follows all commands.  Assessment/Plan: Acute hypoxemic and hypercarbic respiratory failure/VDRF -Multifactorial including VAP, CHF/pulm edema, OSA, OHS, Rt-lung collapse S/P lavage on 11/22 - resolved 11/24 -vent management per pulmonary/CCM -continue ceftriaxone--total antibiotics day #5 -06/20/2015 Blood cultures remain negative  Acute on chronic diastolic CHF/Pulmonary Hypertension -Strict in and out since admission + 4.7 L -Daily weight bed; 11/27= 215.4 kg -06/14/2015 echocardiogram. 55-60%, grade 2 diastolic dysfunction -Increase Lasix 40 mg BID  Acute kidney injury -Serum creatinine peaked at 2.11--> 0.83 -Resolved  Diabetes mellitus type 2 -06/13/2015 Hemoglobin A1c =10.1 -Increase Lantus 45 units daily -Resistant SSI  Acute encephalopathy -Multifactorial including ICU delirium, metabolic derangement, infection, hypercarbia, medications -Appears to have resolved as patient is following all commands and nodding yes and no to questions. -Decrease Methadone to 2.5 mg TID and monitor QTC -Continue Risperdal 2 mg BID  Dysphagia -Patient will require Fiberoptic endoscopic evaluation of swallowing(FEES ) will be performed Monday -If patient passes will  remove NG tube  Hypernatremia -Resolved (upper limits of normal) - decrease  To 100 ml free water QID via NG tube  Nutrition, aspiration  Per NG tube  DM Type 2 Uncontrolled.  -11/13 hemoglobin A1c = 10.1 - Continue resistant SSI  - Increase Lantus to 45 units QHS  Hypokalemia  -Potassium goal > 4      Code Status: FULL Family Communication:  family present at time of exam Disposition Plan: Resolution respiratory failure    Consultants:   Procedure/Significant Events: CXR 11/13- with cardiomegally and pulmonary edema. Venous doppler 11/13- without DVT, superficial thrombosis, or Baker's cyst CXR 11/14- Hypoventilation with bibasilar atelectasis.  TTE 11/14- Mod concentric LV hypertrophy, EF 55-60%, grade 2 diastolic dysfunction, can't assess PV.  CXR 11/15- Slight interval deterioration in the pulmonary interstitium with increased pulmonary vascular prominence. Developing left lower lobe atelectasis is suspected.  CXR 11/17- Worsening pulmonary edema.  CXR 11/18- Slight improvement in pulmonary vascular congestion. Persistent bibasilar atelectasis or pneumonia CXR 11/19- Persistent mild lung base atelectasis and small effusions without pulmonary edema. CXR 11/21- Low lung vol, bibasilar atelectasis and small effusions Bronchoscopy 11/22- Impressive thin grey secretions with complete collapse rt main and all lobes rt and partial 50% left main  SIGNIFICANT EVENTS: 11/13 intubation for hypercarbic/hypoxic respiratory failure. 11/14- transitioned from Lovenox gtt to heparin gtt 11/15- removed PA catheter and replaced with CVL, decrease Lasix gtt  11/17- transitioned to intermittent Lasix 11/20- T 102.5, pan culture and Levaquin  11/22- bronchoscopy, tracheostomy in OR by ENT 11/23- severe delirium  11/24- improved with new cocktail neuro, off drips   Culture 11/20 blood left  AC/right hand negative final  11/20 urine positive multiple species   11/20  tracheal aspirate normal flora  11/21 BAL normal flora    Antibiotics: Ceftaz 11/22>>11/24 Linazolid 11/22>>11/23 Levaquin 11/20>>  11/22 Ceftriaxone to 11/24 to stop date   DVT prophylaxis: Subcutaneous heparin   Devices Tracheostomy in place   LINES / TUBES:  OETT 11/13>>>11/22 R IJ PAC 11/13>>>11/5, replaced on 11/15 with CVL Trach 11/22>>  Continuous Infusions: . sodium chloride 10 mL/hr at 06/21/15 1936    Objective: VITAL SIGNS: Temp: 98.9 F (37.2 C) (11/27 0400) Temp Source: Oral (11/27 0400) BP: 113/66 mmHg (11/27 0400) Pulse Rate: 92 (11/27 0400) SPO2; FIO2:   Intake/Output Summary (Last 24 hours) at 06/27/15 0815 Last data filed at 06/27/15 0600  Gross per 24 hour  Intake 2386.5 ml  Output   1975 ml  Net  411.5 ml     Exam: General: patient will open his eyes and answer all questions with nods of his head, follows all commands.positive acute respiratory distress (ventilator dependent), now on 10 L blow-by. Will write questions on paper Eyes: negative scleral hemorrhage ENT: Negative Runny negative gingival bleeding, Neck:  Negative scars, masses, torticollis, lymphadenopathy, JVD, trachea present without discharge or signs of infection Lungs: Clear to auscultation bilaterally without wheezes or crackles Cardiovascular: Regular rate and rhythm without murmur gallop or rub normal S1 and S2 Abdomen: Morbidly Morbidly obese, negative abdominal pain,positive soft, bowel sounds, no rebound, no ascites, no appreciable mass Extremities: No significant cyanosis, clubbing, or edema bilateral lower extremities Psychiatric:  Negative depression, negative anxiety, negative fatigue, negative mania  Neurologic:  Cranial nerves II through XII intact, tongue/uvula midline, all extremities muscle strength 5/5, sensation intact throughout, unable to assess dysarthria, negative expressive aphasia, negative receptive aphasia.   Data Reviewed: Basic Metabolic  Panel:  Recent Labs Lab 06/24/15 0340 06/24/15 1559 06/25/15 0437 06/26/15 0530 06/26/15 1600 06/27/15 0236  NA 146* 145 144 145  --  145  K 3.6 3.7 3.5 3.3* 4.0 3.8  CL 102 102 101 103  --  101  CO2 38* 35* 38* 38*  --  38*  GLUCOSE 235* 291* 291* 228*  --  216*  BUN 41* 34* 30* 26*  --  26*  CREATININE 1.22 0.98 0.95 0.89  --  0.83  CALCIUM 9.0 8.9 8.9 9.0  --  9.0  MG  --   --   --   --  1.9 1.8   Liver Function Tests:  Recent Labs Lab 06/27/15 0236  AST 31  ALT 30  ALKPHOS 67  BILITOT 0.2*  PROT 6.9  ALBUMIN 2.3*   No results for input(s): LIPASE, AMYLASE in the last 168 hours. No results for input(s): AMMONIA in the last 168 hours. CBC:  Recent Labs Lab 06/23/15 0409 06/24/15 0340 06/25/15 0437 06/26/15 0530 06/27/15 0236  WBC 13.4* 10.1 7.9 9.1 8.8  NEUTROABS  --  7.4  --   --  6.0  HGB 10.9* 10.9* 10.8* 10.8* 10.9*  HCT 37.9* 36.6* 36.9* 35.9* 36.4*  MCV 97.4 94.6 94.9 94.5 95.0  PLT 202 216 205 213 238   Cardiac Enzymes: No results for input(s): CKTOTAL, CKMB, CKMBINDEX, TROPONINI in the last 168 hours. BNP (last 3 results)  Recent Labs  06/12/15 1539  BNP 86.7    ProBNP (last 3 results) No results for input(s): PROBNP in the last 8760 hours.  CBG:  Recent Labs Lab 06/26/15 1242 06/26/15 1656 06/26/15 2011 06/27/15 0056 06/27/15 0519  GLUCAP 185* 180* 209* 191* 209*    Recent Results (from the past 240 hour(s))  Culture, blood (routine x 2)     Status: None   Collection Time: 06/20/15  9:44  PM  Result Value Ref Range Status   Specimen Description BLOOD LEFT ANTECUBITAL  Final   Special Requests BOTTLES DRAWN AEROBIC AND ANAEROBIC 10CC  Final   Culture NO GROWTH 5 DAYS  Final   Report Status 06/25/2015 FINAL  Final  Culture, Urine     Status: None   Collection Time: 06/20/15  9:45 PM  Result Value Ref Range Status   Specimen Description URINE, CATHETERIZED  Final   Special Requests NONE  Final   Culture MULTIPLE SPECIES  PRESENT, SUGGEST RECOLLECTION  Final   Report Status 06/22/2015 FINAL  Final  Culture, respiratory (NON-Expectorated)     Status: None   Collection Time: 06/20/15  9:48 PM  Result Value Ref Range Status   Specimen Description TRACHEAL ASPIRATE  Final   Special Requests NONE  Final   Gram Stain   Final    MODERATE WBC PRESENT, PREDOMINANTLY PMN NO SQUAMOUS EPITHELIAL CELLS SEEN RARE GRAM POSITIVE COCCI IN PAIRS IN CLUSTERS RARE GRAM NEGATIVE RODS RARE GRAM POSITIVE RODS    Culture   Final    Non-Pathogenic Oropharyngeal-type Flora Isolated. Performed at [facility]    Report Status 06/23/2015 FINAL  Final  Culture, blood (routine x 2)     Status: None   Collection Time: 06/20/15  9:49 PM  Result Value Ref Range Status   Specimen Description BLOOD RIGHT HAND  Final   Special Requests BOTTLES DRAWN AEROBIC ONLY 5CC  Final   Culture NO GROWTH 5 DAYS  Final   Report Status 06/25/2015 FINAL  Final  Culture, bal-quantitative     Status: None   Collection Time: 06/21/15 12:48 PM  Result Value Ref Range Status   Specimen Description BRONCHIAL ALVEOLAR LAVAGE  Final   Special Requests NONE  Final   Gram Stain   Final    ABUNDANT WBC PRESENT,BOTH PMN AND MONONUCLEAR NO SQUAMOUS EPITHELIAL CELLS SEEN FEW GRAM VARIABLE ROD RARE GRAM POSITIVE COCCI IN PAIRS    Colony Count   Final    >=100,000 COLONIES/ML Performed at [facility]    Culture   Final    Non-Pathogenic Oropharyngeal-type Flora Isolated. Performed at [facility]    Report Status 06/24/2015 FINAL  Final     Studies:  Recent x-ray studies have been reviewed in detail by the Attending Physician  Scheduled Meds:  Scheduled Meds: . alteplase  2 mg Intracatheter Once  . cefTRIAXone (ROCEPHIN)  IV  1 g Intravenous Q24H  . chlorhexidine  15 mL Mouth Rinse BID  . feeding supplement (VITAL HIGH PROTEIN)  1,000 mL Per Tube Q24H  . free water  200 mL Per Tube QID  . furosemide  40 mg  Intravenous Daily  . heparin subcutaneous  5,000 Units Subcutaneous 3 times per day  . insulin aspart  0-20 Units Subcutaneous 6 times per day  . insulin glargine  35 Units Subcutaneous QHS  . loratadine  10 mg Oral Daily  . methadone  2.5 mg Oral 3 times per day  . pantoprazole sodium  40 mg Per Tube Q24H  . polyethylene glycol  17 g Oral Daily  . risperiDONE  2 mg Oral BID    Time spent on care of this patient: 40 mins   Hailee Hollick, Roselind Messier , MD  [facility] Office  [phone] Pager [phone]  On-Call/Text Page:      [email or web address]      password [email or web address]  If 7PM-7AM, please contact night-coverage [email or web address] Password [facility] 06/27/2015, 8:15  AM   LOS: 15 days   Care during the described time interval was provided by me .  I have reviewed this patient's available data, including medical history, events of note, physical examination, and all test results as part of my evaluation. I have personally reviewed and interpreted all radiology studies.   Carolyne Littlesurtis Glorine Hanratty, MD [phone] Pager

## 2015-06-27 NOTE — Progress Notes (Signed)
Subjective: On trach collar, off vent.  Objective: Vital signs in last 24 hours: Temp:  [98.5 F (36.9 C)-99.3 F (37.4 C)] 99.2 F (37.3 C) (11/27 0822) Pulse Rate:  [92-103] 95 (11/27 0822) Resp:  [14-27] 16 (11/27 0822) BP: (113-146)/(66-122) 128/78 mmHg (11/27 0822) SpO2:  [94 %-100 %] 94 % (11/27 1115) FiO2 (%):  [40 %-60 %] 60 % (11/27 1115) Weight:  [215.459 kg (475 lb)] 215.459 kg (475 lb) (11/27 0500)  PHYSICAL EXAMINATION: General: Morbidly obese male. On trach collar. Neuro:Sleeping. Neck: Obese. Trach in place. No bleeding. Oral: Normal mucosa. Lungs: Good air movement over the anterior chest wall bilaterally.    Recent Labs  06/26/15 0530 06/27/15 0236  WBC 9.1 8.8  HGB 10.8* 10.9*  HCT 35.9* 36.4*  PLT 213 238    Recent Labs  06/26/15 0530 06/26/15 1600 06/27/15 0236  NA 145  --  145  K 3.3* 4.0 3.8  CL 103  --  101  CO2 38*  --  38*  GLUCOSE 228*  --  216*  BUN 26*  --  26*  CREATININE 0.89  --  0.83  CALCIUM 9.0  --  9.0    Medications:  I have reviewed the patient's current medications. Scheduled: . alteplase  2 mg Intracatheter Once  . cefTRIAXone (ROCEPHIN)  IV  1 g Intravenous Q24H  . chlorhexidine  15 mL Mouth Rinse BID  . feeding supplement (VITAL HIGH PROTEIN)  1,000 mL Per Tube Q24H  . free water  200 mL Per Tube QID  . furosemide  40 mg Intravenous Daily  . heparin subcutaneous  5,000 Units Subcutaneous 3 times per day  . insulin aspart  0-20 Units Subcutaneous 6 times per day  . insulin glargine  45 Units Subcutaneous QHS  . loratadine  10 mg Oral Daily  . methadone  2.5 mg Oral 3 times per day  . pantoprazole sodium  40 mg Per Tube Q24H  . polyethylene glycol  17 g Oral Daily  . risperiDONE  2 mg Oral BID   VQQ:[id number]:bisacodyl, fentaNYL (SUBLIMAZE) injection, LORazepam, [DISCONTINUED] ondansetron **OR** ondansetron (ZOFRAN) IV, sennosides  Assessment/Plan: POD #5 s/p trach. Doing well, off vent during the day time. Will  change trach next week.   LOS: 15 days   Shatori Bertucci,SUI W 06/27/2015, 11:47 AM

## 2015-06-28 DIAGNOSIS — G9340 Encephalopathy, unspecified: Secondary | ICD-10-CM

## 2015-06-28 LAB — COMPREHENSIVE METABOLIC PANEL
ALK PHOS: 73 U/L (ref 38–126)
ALT: 35 U/L (ref 17–63)
AST: 33 U/L (ref 15–41)
Albumin: 2.4 g/dL — ABNORMAL LOW (ref 3.5–5.0)
Anion gap: 7 (ref 5–15)
BILIRUBIN TOTAL: 0.4 mg/dL (ref 0.3–1.2)
BUN: 28 mg/dL — AB (ref 6–20)
CO2: 39 mmol/L — AB (ref 22–32)
CREATININE: 0.83 mg/dL (ref 0.61–1.24)
Calcium: 9.2 mg/dL (ref 8.9–10.3)
Chloride: 99 mmol/L — ABNORMAL LOW (ref 101–111)
GFR calc Af Amer: >60 mL/min (ref >60)
Glucose, Bld: 217 mg/dL — ABNORMAL HIGH (ref 65–99)
POTASSIUM: 3.9 mmol/L (ref 3.5–5.1)
Sodium: 145 mmol/L (ref 135–145)
Total Protein: 6.9 g/dL (ref 6.5–8.1)

## 2015-06-28 LAB — CBC WITH DIFFERENTIAL/PLATELET
Basophils Absolute: 0 10*3/uL (ref 0.0–0.1)
Basophils Relative: 0 %
EOS ABS: 0.3 10*3/uL (ref 0.0–0.7)
EOS PCT: 3 %
HCT: 36.7 % — ABNORMAL LOW (ref 39.0–52.0)
HEMOGLOBIN: 11.1 g/dL — AB (ref 13.0–17.0)
LYMPHS ABS: 2.5 10*3/uL (ref 0.7–4.0)
Lymphocytes Relative: 27 %
MCH: 28.2 pg (ref 26.0–34.0)
MCHC: 30.2 g/dL (ref 30.0–36.0)
MCV: 93.4 fL (ref 78.0–100.0)
MONOS PCT: 5 %
Monocytes Absolute: 0.4 10*3/uL (ref 0.1–1.0)
NEUTROS PCT: 64 %
Neutro Abs: 5.8 10*3/uL (ref 1.7–7.7)
Platelets: 252 10*3/uL (ref 150–400)
RBC: 3.93 MIL/uL — ABNORMAL LOW (ref 4.22–5.81)
RDW: 13.2 % (ref 11.5–15.5)
WBC: 9 10*3/uL (ref 4.0–10.5)

## 2015-06-28 LAB — GLUCOSE, CAPILLARY
GLUCOSE-CAPILLARY: 121 mg/dL — AB (ref 65–99)
GLUCOSE-CAPILLARY: 147 mg/dL — AB (ref 65–99)
GLUCOSE-CAPILLARY: 164 mg/dL — AB (ref 65–99)
GLUCOSE-CAPILLARY: 213 mg/dL — AB (ref 65–99)
Glucose-Capillary: 161 mg/dL — ABNORMAL HIGH (ref 65–99)
Glucose-Capillary: 209 mg/dL — ABNORMAL HIGH (ref 65–99)
Glucose-Capillary: 209 mg/dL — ABNORMAL HIGH (ref 65–99)

## 2015-06-28 LAB — TRIGLYCERIDES: Triglycerides: 161 mg/dL — ABNORMAL HIGH (ref <150)

## 2015-06-28 LAB — MAGNESIUM: MAGNESIUM: 1.7 mg/dL (ref 1.7–2.4)

## 2015-06-28 MED ORDER — METHADONE HCL 5 MG PO TABS
2.5000 mg | ORAL_TABLET | Freq: Two times a day (BID) | ORAL | Status: DC
  Administered 2015-06-28 – 2015-06-29 (×2): 2.5 mg via ORAL
  Filled 2015-06-28 (×2): qty 1

## 2015-06-28 MED ORDER — RISPERIDONE 1 MG/ML PO SOLN
1.0000 mg | Freq: Two times a day (BID) | ORAL | Status: DC
  Administered 2015-06-28 – 2015-06-29 (×2): 1 mg via ORAL
  Filled 2015-06-28 (×3): qty 1

## 2015-06-28 NOTE — Progress Notes (Signed)
Physical Therapy Treatment Patient Details Name: Derrick [name] MRN: [id number] DOB: 1981/07/08 Today's Date: 06/28/2015    History of Present Illness Patient is a 34 y/o male presents with abdominal pain and found to have hypercarbic respiratory failure and AMS. Required intubation 11/13. Pt became ventilator dependent with worsening pulmonary edema s/p trach 11/22. PMH includes Morbid Obesity, DM2, HTN, OSA     PT Comments    Pt admitted with above diagnosis. Pt currently with functional limitations due to balance and endurance deficits.  Pt able to stand x2.  Did not get OOB as pt going for FEES.  Pt progressing well.  Very motivated to move. Pt will benefit from skilled PT to increase their independence and safety with mobility to allow discharge to the venue listed below.    Follow Up Recommendations  CIR     Equipment Recommendations  Other (comment) (TBA)    Recommendations for Other Services       Precautions / Restrictions Precautions Precautions: Fall Precaution Comments: trach; flexiseal Restrictions Weight Bearing Restrictions: No    Mobility  Bed Mobility Overal bed mobility: Needs Assistance Bed Mobility: Supine to Sit     Supine to sit: Max assist;+2 for physical assistance;+2 for safety/equipment;HOB elevated     General bed mobility comments: Cues to bring BLEs to EOB and assist to elevate trunk. Cues for technique.Incr time.   Transfers Overall transfer level: Needs assistance Equipment used: Rolling walker (2 wheeled) Transfers: Sit to/from Stand Sit to Stand: Min assist;+2 physical assistance;+2 safety/equipment;From elevated surface         General transfer comment: Stood x2 for up to 1 minute each attempt, able to stand upright both attempts with cues for proper foot/hand placement to RW.  Pt would sit back on bed after ~1 minute due to bil LEs fatigue per pt.    Ambulation/Gait                 Stairs             Wheelchair Mobility    Modified Rankin (Stroke Patients Only)       Balance Overall balance assessment: Needs assistance Sitting-balance support: Bilateral upper extremity supported;Feet supported Sitting balance-Leahy Scale: Poor Sitting balance - Comments: Requires Min A to min guard assist for sitting EOB esp when fatigued. Able to sit EOB unsupported for a few minutes.    Standing balance support: Bilateral upper extremity supported;During functional activity Standing balance-Leahy Scale: Poor Standing balance comment: reliant on RW for balance in standing.                     Cognition Arousal/Alertness: Awake/alert Behavior During Therapy: WFL for tasks assessed/performed Overall Cognitive Status: Difficult to assess                      Exercises General Exercises - Lower Extremity Ankle Circles/Pumps: AROM;Both;10 reps;Supine Quad Sets: AROM;Both;10 reps;Supine Gluteal Sets: AROM;Both;10 reps;Supine Long Arc Quad: AROM;Both;5 reps;Seated Heel Slides: AROM;Both;5 reps;Supine    General Comments        Pertinent Vitals/Pain Pain Assessment: No/denies pain  90-104 bpm, 93% on 40% trach collar, 133/74 pre BP, 127/87 post BP.     Home Living                      Prior Function            PT Goals (current goals can now be found in the care  plan section) Progress towards PT goals: Progressing toward goals    Frequency  Min 3X/week    PT Plan Current plan remains appropriate    Co-evaluation             End of Session Equipment Utilized During Treatment: Gait belt;Oxygen (trach collar) Activity Tolerance: Patient tolerated treatment well;Patient limited by fatigue Patient left: in bed;with call bell/phone within reach;with family/visitor present     Time: [id number] PT Time Calculation (min) (ACUTE ONLY): 32 min  Charges:  $Gait Training: 8-22 mins $Therapeutic Exercise: 8-22 mins                    G Codes:       WhiteAlvis Lemmings, Leidy Massar F 06/28/2015, 10:45 AM Kaled Allende,PT Acute Rehabilitation [phone] (pager)

## 2015-06-28 NOTE — Progress Notes (Addendum)
Pt. Asked for something to help him sleep. RN left the room to look at orders and when RN returned the pt was asleep. No medication was given. Will continue to monitor.

## 2015-06-28 NOTE — Progress Notes (Signed)
When DR Tyson AliasFeinstein made rounds today he stated that if pt passed FEEs study and had a tray we could d/c his feeding tube. Pt did both so [place]ak tube d/c'd. Tolerated d/c well. Has passeymuir speaking valve on tolerating po Fluids and food.

## 2015-06-28 NOTE — Progress Notes (Signed)
PULMONARY / CRITICAL CARE MEDICINE   Name: Derrick [name] MRN: [id number] DOB: 1981/05/27    ADMISSION DATE:  06/12/2015 CONSULTATION DATE:  06/13/2015  REFERRING MD :  TRH  CHIEF COMPLAINT:  Hypercarbic respiratory failure and AMS  INITIAL PRESENTATION: 34 year old male with morbid obesity, OSA and OHV who presents to PCCM with AMS.  ABG was ordered and patient was noted to be hypercarbic and with respiratory acidosis.  Subsequently the patient developed hypoxemia as well.  Patient was transferred to the ICU and BiPAP was started.  F/U ABG post BiPAP showed no evidence of improvement and mental status continued to deteriorate so decision was made to intubate.  STUDIES:  CXR 11/13 >> with cardiomegally and pulmonary edema. Venous doppler 11/13 >> without DVT, superficial thrombosis, or Baker's cyst CXR 11/14 >> Hypoventilation with bibasilar atelectasis.  TTE 11/14 >> Mod concentric LV hypertrophy, EF 55-60%, grade 2 diastolic dysfunction, can't assess PV.  CXR 11/15 >> Slight interval deterioration in the pulmonary interstitium with increased pulmonary vascular prominence. Developing left lower lobe atelectasis is suspected.  CXR 11/17 >> Worsening pulmonary edema.  CXR 11/18 >> Slight improvement in pulmonary vascular congestion. Persistent bibasilar atelectasis or pneumonia CXR 11/19 >> Persistent mild lung base atelectasis and small effusions without pulmonary edema. CXR 11/21 >> Low lung vol, bibasilar atelectasis and small effusions Bronchoscopy 11/22 >> Impressive thin grey secretions with complete collapse rt main and all lobes rt and partial 50% left main  SIGNIFICANT EVENTS: 11/13  intubation for hypercarbic/hypoxic respiratory failure. 11/14  transitioned from Lovenox gtt to heparin gtt 11/15  removed PA catheter and replaced with CVL, decrease Lasix gtt  11/17  transitioned to intermittent Lasix 11/20  T 102.5, pan culture and Levaquin  11/22  bronchoscopy,  tracheostomy in OR by ENT 11/23  severe delirium  11/24  improved with new cocktail neuro, off drips  SUBJECTIVE:   Pt reports feeling good - worked with PT this am.  Mother at bedside.  Pending SLP evaluation for swallowing, hopeful to be able to eat.     VITAL SIGNS: Temp:  [98.7 F (37.1 C)-99.1 F (37.3 C)] 98.8 F (37.1 C) (11/28 0821) Pulse Rate:  [85-100] 87 (11/28 0821) Resp:  [14-26] 16 (11/28 0821) BP: (121-165)/(65-96) 133/74 mmHg (11/28 0821) SpO2:  [92 %-97 %] 96 % (11/28 0821) FiO2 (%):  [40 %-60 %] 40 % (11/28 0815) Weight:  [474 lb (215.005 kg)] 474 lb (215.005 kg) (11/28 0400)   HEMODYNAMICS:     VENTILATOR SETTINGS: Vent Mode:  [-] PRVC FiO2 (%):  [40 %-60 %] 40 % Set Rate:  [14 bmp] 14 bmp Vt Set:  [580 mL] 580 mL PEEP:  [5 cmH20] 5 cmH20   INTAKE / OUTPUT:  Intake/Output Summary (Last 24 hours) at 06/28/15 0950 Last data filed at 06/28/15 0600  Gross per 24 hour  Intake   2980 ml  Output   2700 ml  Net    280 ml    PHYSICAL EXAMINATION: General:  Obese male in NAD Neuro:  AAOx4, MAE, generalized weakness, smiles when entering the room Cardiovascular:  s1 s2 RRR distant Lungs: even/non-labored, distant, coarse Abdomen:  Soft, obese, ND and +BS. Musculoskeletal:  No edema Skin:  Intact, no new rashes  LABS:  CBC  Recent Labs Lab 06/26/15 0530 06/27/15 0236 06/28/15 0205  WBC 9.1 8.8 9.0  HGB 10.8* 10.9* 11.1*  HCT 35.9* 36.4* 36.7*  PLT 213 238 252   Coag's  Recent Labs Lab  06/21/15 1211  INR 1.35   BMET  Recent Labs Lab 06/26/15 0530 06/26/15 1600 06/27/15 0236 06/28/15 0205  NA 145  --  145 145  K 3.3* 4.0 3.8 3.9  CL 103  --  101 99*  CO2 38*  --  38* 39*  BUN 26*  --  26* 28*  CREATININE 0.89  --  0.83 0.83  GLUCOSE 228*  --  216* 217*   Electrolytes  Recent Labs Lab 06/26/15 0530 06/26/15 1600 06/27/15 0236 06/28/15 0205  CALCIUM 9.0  --  9.0 9.2  MG  --  1.9 1.8 1.7   Sepsis Markers No results for  input(s): LATICACIDVEN, PROCALCITON, O2SATVEN in the last 168 hours.   ABG No results for input(s): PHART, PCO2ART, PO2ART in the last 168 hours.   Liver Enzymes  Recent Labs Lab 06/27/15 0236 06/28/15 0205  AST 31 33  ALT 30 35  ALKPHOS 67 73  BILITOT 0.2* 0.4  ALBUMIN 2.3* 2.4*   Cardiac Enzymes No results for input(s): TROPONINI, PROBNP in the last 168 hours.   Glucose  Recent Labs Lab 06/27/15 1305 06/27/15 1616 06/27/15 1949 06/28/15 0030 06/28/15 0446 06/28/15 0819  GLUCAP 205* 206* 159* 209* 209* 213*    Imaging No results found.   ASSESSMENT / PLAN:  PULMONARY OETT 11/13 >> 11/22 Trach 11/22 >> A:  Hypercarbic VDRF - due to morbid obesity, OSA, OHV and CHF. PE less likely given history COLLAPSE RLL s/p broncho with lavage on 11/22 - resolved 11/24 Concerns for pneumonia VAP P:   - ATC as tolerated  - SLP efforts for swallowing & PMV  - Intermittent CXR    CARDIOVASCULAR R IJ PAC 11/13 >> 11/5, replaced on 11/15 with CVL A:  CHF exacerbation due to morbid obesity and likely pulmonary edema and pulmonary HTN. P:  - Keep even - Monitor in SDU   RENAL A:   Hypernatremia - resolved  P:   - d5w kvo - Trend BMP  - Replace electrolytes as indicated   GASTROINTESTINAL A:   Nutrition, aspiration TF in past? P:   - post pyloric feeding - SLP following, pending swallow evaluation 11/28 - Protonix daily  HEMATOLOGIC A:   Anemia  DVT ppx P:  - SQ heparin for DVT prophylaxis  - limit blood draws  INFECTIOUS A:  PNA, collapse  improved P:   Blood cx 11/20 >> neg Urine cx 11/20 >> neg BAL 11/22 >> 100k normal flora   Ceftaz 11/22 >>11/24 Linazolid 11/22 >>11/23 Levaquin  11/20 >> 11/22 Ceftriaxone to 11/24 to stop date   ENDOCRINE A:   DM.   A1c this hospitalization was 10.1 Hyperglycemia  P:   -  resistant SSI  -  Novolog to 6 units q4hrs while on tube feeds -  Lantus 45 units QD -  CBGs.  NEUROLOGIC A:   AMS due  to hypercarbia, severe agitation, delirium  - improved P:   RASS goal: 0 Methadone 2.5 mg PO Q8, continue to wean to off  Risperdal to 2 mg BID - keep Push PT efforts    Canary BrimBrandi Ollis, NP-C [facility] Pgr: [phone] or if no answer [phone] 06/28/2015, 9:50 AM   STAFF NOTE: I, Rory Percyaniel Feinstein, MD FACP have personally reviewed patient's available data, including medical history, events of note, physical examination and test results as part of my evaluation. I have discussed with resident/NP and other care providers such as pharmacist, RN and RRT. In addition,  I personally evaluated patient and elicited key findings of: no distress on TC, he did 12 hr TC yesterday, his delirium resolved nicely with Risperdal and methadone, he remains int lethargic, would continued to wean slowly methadone to off, and reduce Risperdal to 1 mg today, he remains on TC, would have a goal of 24 hrs today, PMV, SLP today noted, need to discuss compliance with BIPAP in future if/when decannulation discussed, neg balance goals remain monitoring NA closely, very sensitive to hypernatremia  Mcarthur Rossetti. Tyson Alias, MD, FACP Pgr: [phone] [facility] 06/28/2015 10:51 AM

## 2015-06-28 NOTE — Progress Notes (Signed)
Subjective: Pt back on vent. No trach issues.  Objective: Vital signs in last 24 hours: Temp:  [98.7 F (37.1 C)-99.2 F (37.3 C)] 99.1 F (37.3 C) (11/28 0000) Pulse Rate:  [92-100] 94 (11/28 0000) Resp:  [14-26] 14 (11/28 0000) BP: (113-165)/(66-96) 136/77 mmHg (11/28 0000) SpO2:  [92 %-97 %] 92 % (11/28 0000) FiO2 (%):  [40 %-60 %] 40 % (11/27 2158) Weight:  [215.459 kg (475 lb)] 215.459 kg (475 lb) (11/27 0500)  PHYSICAL EXAMINATION: General: Morbidly obese male. On vent. Neuro:Sleeping. Neck: Obese. Trach in place. No bleeding. Oral: Normal mucosa. Lungs: Good air movement over the anterior chest wall bilaterally.    Recent Labs  06/26/15 0530 06/27/15 0236  WBC 9.1 8.8  HGB 10.8* 10.9*  HCT 35.9* 36.4*  PLT 213 238    Recent Labs  06/26/15 0530 06/26/15 1600 06/27/15 0236  NA 145  --  145  K 3.3* 4.0 3.8  CL 103  --  101  CO2 38*  --  38*  GLUCOSE 228*  --  216*  BUN 26*  --  26*  CREATININE 0.89  --  0.83  CALCIUM 9.0  --  9.0    Medications:  I have reviewed the patient's current medications. Scheduled: . alteplase  2 mg Intracatheter Once  . cefTRIAXone (ROCEPHIN)  IV  1 g Intravenous Q24H  . chlorhexidine  15 mL Mouth Rinse BID  . feeding supplement (VITAL HIGH PROTEIN)  1,000 mL Per Tube Q24H  . free water  100 mL Per Tube QID  . furosemide  40 mg Intravenous BID  . heparin subcutaneous  5,000 Units Subcutaneous 3 times per day  . insulin aspart  0-20 Units Subcutaneous 6 times per day  . insulin glargine  45 Units Subcutaneous QHS  . loratadine  10 mg Oral Daily  . methadone  2.5 mg Oral 3 times per day  . pantoprazole sodium  40 mg Per Tube Q24H  . polyethylene glycol  17 g Oral Daily  . risperiDONE  2 mg Oral BID   RUE:[id number]:bisacodyl, fentaNYL (SUBLIMAZE) injection, LORazepam, [DISCONTINUED] ondansetron **OR** ondansetron (ZOFRAN) IV, sennosides  Assessment/Plan: POD #7 s/p trach. Doing well, off vent during the day time. Will change  trach to a #6 XLT tomorrow.    LOS: 16 days   Franceska Strahm,SUI W 06/28/2015, 12:46 AM

## 2015-06-28 NOTE — Procedures (Signed)
Objective Swallowing Evaluation: FEES-Fiberoptic Endoscopic Evaluation of Swallow  Patient Details  Name: Derrick [name] MRN: [id number] Date of Birth: 05/22/1981  Today's Date: 06/28/2015 Time: SLP Start Time (ACUTE ONLY): 1345-SLP Stop Time (ACUTE ONLY): 1405 SLP Time Calculation (min) (ACUTE ONLY): 20 min  Past Medical History:  Past Medical History  Diagnosis Date  . Hypertension   . Reflux   . Diabetes mellitus without complication (HCC)   . Acute on chronic diastolic CHF (congestive heart failure), NYHA class 1 (HCC)    Past Surgical History:  Past Surgical History  Procedure Laterality Date  . Tracheostomy tube placement N/A 06/21/2015    Procedure: TRACHEOSTOMY;  Surgeon: Newman Pies, MD;  Location: [facility] OR;  Service: ENT;  Laterality: N/A;   HPI: 34 year old male with morbid obesity, OSA and OHV who presents to PCCM with AMS. ABG was ordered and patient was noted to be hypercarbic and with respiratory acidosis. Subsequently the patient developed hypoxemia as well. Patient was transferred to the ICU and BiPAP was started. F/U ABG post BiPAP showed no evidence of improvement and mental status continued to deteriorate so decision was made to intubate. Pt was intubated 11/13-11/22, at which time trach was placed.  Subjective: pt alert, eager to communicate and try POs  Assessment / Plan / Recommendation  CHL IP CLINICAL IMPRESSIONS 06/28/2015  Therapy Diagnosis Mild pharyngeal phase dysphagia  Clinical Impression Edematous laryngeal anatomy without visible pharyngeal secretions. Min-mild primarily sensory based pharygneal dysphagia with PMSV donned during FEES. Swallow initiation at pyriform sinuses with thin and trace inter-arytenoid space residue. Mild vallecular residue following solid that cleared with thin liquid wash. Positioning for upright position is challenging with the bariatric bed. Recommend regular texture and thin liquids, small sips with straw allowed, PMSV with  meals/snacks and pills whole in applesauce. Continue ST for safety.         Impact on safety and function (No Data)      CHL IP TREATMENT RECOMMENDATION 06/28/2015  Treatment Recommendations Therapy as outlined in treatment plan below     Prognosis 06/28/2015  Prognosis for Safe Diet Advancement Good  Barriers to Reach Goals --  Barriers/Prognosis Comment --    CHL IP DIET RECOMMENDATION 06/28/2015  SLP Diet Recommendations Thin liquid;Regular solids  Liquid Administration via Cup;Straw  Medication Administration Whole meds with puree  Compensations Slow rate;Small sips/bites;Follow solids with liquid  Postural Changes Seated upright at 90 degrees;Remain semi-upright after after feeds/meals (Comment)      CHL IP OTHER RECOMMENDATIONS 06/28/2015  Recommended Consults --  Oral Care Recommendations Oral care BID  Other Recommendations --      CHL IP FOLLOW UP RECOMMENDATIONS 06/28/2015  Follow up Recommendations (No Data)      CHL IP FREQUENCY AND DURATION 06/28/2015  Speech Therapy Frequency (ACUTE ONLY) min 2x/week  Treatment Duration 2 weeks           CHL IP ORAL PHASE 06/28/2015  Oral Phase WFL  Oral - Pudding Teaspoon --  Oral - Pudding Cup --  Oral - Honey Teaspoon --  Oral - Honey Cup --  Oral - Nectar Teaspoon --  Oral - Nectar Cup --  Oral - Nectar Straw --  Oral - Thin Teaspoon --  Oral - Thin Cup --  Oral - Thin Straw --  Oral - Puree --  Oral - Mech Soft --  Oral - Regular --  Oral - Multi-Consistency --  Oral - Pill --  Oral Phase - Comment --  CHL IP PHARYNGEAL PHASE 06/28/2015  Pharyngeal Phase Impaired  Pharyngeal- Pudding Teaspoon --  Pharyngeal --  Pharyngeal- Pudding Cup --  Pharyngeal --  Pharyngeal- Honey Teaspoon --  Pharyngeal --  Pharyngeal- Honey Cup --  Pharyngeal --  Pharyngeal- Nectar Teaspoon --  Pharyngeal --  Pharyngeal- Nectar Cup --  Pharyngeal --  Pharyngeal- Nectar Straw --  Pharyngeal --  Pharyngeal- Thin  Teaspoon --  Pharyngeal --  Pharyngeal- Thin Cup Delayed swallow initiation-pyriform sinuses;Inter-arytenoid space residue  Pharyngeal --  Pharyngeal- Thin Straw --  Pharyngeal --  Pharyngeal- Puree --  Pharyngeal --  Pharyngeal- Mechanical Soft --  Pharyngeal --  Pharyngeal- Regular Pharyngeal residue - valleculae;Delayed swallow initiation-vallecula  Pharyngeal --  Pharyngeal- Multi-consistency --  Pharyngeal --  Pharyngeal- Pill --  Pharyngeal --  Pharyngeal Comment --     CHL IP CERVICAL ESOPHAGEAL PHASE 06/28/2015  Cervical Esophageal Phase WFL  Pudding Teaspoon --  Pudding Cup --  Honey Teaspoon --  Honey Cup --  Nectar Teaspoon --  Nectar Cup --  Nectar Straw --  Thin Teaspoon --  Thin Cup --  Thin Straw --  Puree --  Mechanical Soft --  Regular --  Multi-consistency --  Pill --  Cervical Esophageal Comment --     Royce MacadamiaLitaker, Jakeline Dave Willis 06/28/2015, 2:46 PM   Breck CoonsLisa Willis Baran Kuhrt M.Ed [employer]SLP Pager [phone]

## 2015-06-28 NOTE — Progress Notes (Signed)
[facility] TEAM 1 - Stepdown/ICU TEAM PROGRESS NOTE  YASMIN DIBELLO WUJ:[id number] DOB: [birth year] DOA: 06/12/2015 PCP: [facility]  Admit HPI / Brief Narrative: 34yo M Hx DM2 Uncontrolled, HTN, Morbid Obesity, and OSA/OHS who presented with AMS. ABG noted to be hypercarbia and with respiratory acidosis. Subsequently the patient developed hypoxemia as well. Patient was transferred to the ICU and BiPAP was started. F/U ABG post BiPAP showed no evidence of improvement and mental status continued to deteriorate so decision was made to intubate.  HPI/Subjective: All active issues have been addressed by PCCM today.  I have therefore note examined the pt, and will not be billing him for care today.    Assessment/Plan:  Acute hypoxemic and hypercarbic respiratory failure / VDRF -Multifactorial including VAP, CHF/pulm edema, OSA, OHS, Rt-lung collapse S/P lavage on 11/22 -care per PCCM   Acute on chronic diastolic CHF / Pulmonary Hypertension -Strict in and out since admission - now positive ~5.5L thus far -Daily weight bed; 11/28 = 215.0 kg -06/14/2015 echocardiogram. 55-60%, grade 2 diastolic dysfunction -cont Lasix   Acute kidney injury -Serum creatinine peaked at 2.11--> 0.83 -Resolved  Diabetes mellitus type 2 -06/13/2015 A1c 10.1 -CBG improving   Acute encephalopathy -Multifactorial including ICU delirium, metabolic derangement, infection, hypercarbia, medications  Dysphagia -Patient will require Fiberoptic endoscopic evaluation of swallowing (FEES ) will be performed Monday -If patient passes will remove NG tube  Hypernatremia -Resolved  Nutrition  Hypokalemia  -Potassium at goal   Code Status: FULL Family Communication:  Disposition Plan:   Consultants: PCCM  Antibiotics: Ceftaz 11/22>>11/24 Linazolid 11/22>>11/23 Levaquin 11/20>> 11/22 Ceftriaxone 11/24 >  DVT prophylaxis: SQ heparin   Objective: Blood pressure  132/79, pulse 92, temperature 98.7 F (37.1 C), temperature source Oral, resp. rate 18, height  (1.778 m), weight 215.005 kg (474 lb), SpO2 94 %.  Intake/Output Summary (Last 24 hours) at 06/28/15 1324 Last data filed at 06/28/15 1100  Gross per 24 hour  Intake   3470 ml  Output   2700 ml  Net    770 ml   Exam: Pt not examined    Data Reviewed: Basic Metabolic Panel:  Recent Labs Lab 06/24/15 1559 06/25/15 0437 06/26/15 0530 06/26/15 1600 06/27/15 0236 06/28/15 0205  NA 145 144 145  --  145 145  K 3.7 3.5 3.3* 4.0 3.8 3.9  CL 102 101 103  --  101 99*  CO2 35* 38* 38*  --  38* 39*  GLUCOSE 291* 291* 228*  --  216* 217*  BUN 34* 30* 26*  --  26* 28*  CREATININE 0.98 0.95 0.89  --  0.83 0.83  CALCIUM 8.9 8.9 9.0  --  9.0 9.2  MG  --   --   --  1.9 1.8 1.7    CBC:  Recent Labs Lab 06/24/15 0340 06/25/15 0437 06/26/15 0530 06/27/15 0236 06/28/15 0205  WBC 10.1 7.9 9.1 8.8 9.0  NEUTROABS 7.4  --   --  6.0 5.8  HGB 10.9* 10.8* 10.8* 10.9* 11.1*  HCT 36.6* 36.9* 35.9* 36.4* 36.7*  MCV 94.6 94.9 94.5 95.0 93.4  PLT 216 205 213 238 252    Liver Function Tests:  Recent Labs Lab 06/27/15 0236 06/28/15 0205  AST 31 33  ALT 30 35  ALKPHOS 67 73  BILITOT 0.2* 0.4  PROT 6.9 6.9  ALBUMIN 2.3* 2.4*   CBG:  Recent Labs Lab 06/27/15 1949 06/28/15 0030 06/28/15 0446 06/28/15 0819 06/28/15 1221  GLUCAP 159* 209* 209* 213* 161*    Recent Results (from the past 240 hour(s))  Culture, blood (routine x 2)     Status: None   Collection Time: 06/20/15  9:44 PM  Result Value Ref Range Status   Specimen Description BLOOD LEFT ANTECUBITAL  Final   Special Requests BOTTLES DRAWN AEROBIC AND ANAEROBIC 10CC  Final   Culture NO GROWTH 5 DAYS  Final   Report Status 06/25/2015 FINAL  Final  Culture, Urine     Status: None   Collection Time: 06/20/15  9:45 PM  Result Value Ref Range Status   Specimen Description URINE, CATHETERIZED  Final   Special Requests  NONE  Final   Culture MULTIPLE SPECIES PRESENT, SUGGEST RECOLLECTION  Final   Report Status 06/22/2015 FINAL  Final  Culture, respiratory (NON-Expectorated)     Status: None   Collection Time: 06/20/15  9:48 PM  Result Value Ref Range Status   Specimen Description TRACHEAL ASPIRATE  Final   Special Requests NONE  Final   Gram Stain   Final    MODERATE WBC PRESENT, PREDOMINANTLY PMN NO SQUAMOUS EPITHELIAL CELLS SEEN RARE GRAM POSITIVE COCCI IN PAIRS IN CLUSTERS RARE GRAM NEGATIVE RODS RARE GRAM POSITIVE RODS    Culture   Final    Non-Pathogenic Oropharyngeal-type Flora Isolated. Performed at [facility]    Report Status 06/23/2015 FINAL  Final  Culture, blood (routine x 2)     Status: None   Collection Time: 06/20/15  9:49 PM  Result Value Ref Range Status   Specimen Description BLOOD RIGHT HAND  Final   Special Requests BOTTLES DRAWN AEROBIC ONLY 5CC  Final   Culture NO GROWTH 5 DAYS  Final   Report Status 06/25/2015 FINAL  Final  Culture, bal-quantitative     Status: None   Collection Time: 06/21/15 12:48 PM  Result Value Ref Range Status   Specimen Description BRONCHIAL ALVEOLAR LAVAGE  Final   Special Requests NONE  Final   Gram Stain   Final    ABUNDANT WBC PRESENT,BOTH PMN AND MONONUCLEAR NO SQUAMOUS EPITHELIAL CELLS SEEN FEW GRAM VARIABLE ROD RARE GRAM POSITIVE COCCI IN PAIRS    Colony Count   Final    >=100,000 COLONIES/ML Performed at [facility]    Culture   Final    Non-Pathogenic Oropharyngeal-type Flora Isolated. Performed at [facility]    Report Status 06/24/2015 FINAL  Final     Studies:   Recent x-ray studies have been reviewed in detail by the Attending Physician  Scheduled Meds:  Scheduled Meds: . alteplase  2 mg Intracatheter Once  . cefTRIAXone (ROCEPHIN)  IV  1 g Intravenous Q24H  . chlorhexidine  15 mL Mouth Rinse BID  . feeding supplement (VITAL HIGH PROTEIN)  1,000 mL Per Tube Q24H  . free water  100 mL  Per Tube QID  . furosemide  40 mg Intravenous BID  . heparin subcutaneous  5,000 Units Subcutaneous 3 times per day  . insulin aspart  0-20 Units Subcutaneous 6 times per day  . insulin glargine  45 Units Subcutaneous QHS  . loratadine  10 mg Oral Daily  . methadone  2.5 mg Oral Q12H  . pantoprazole sodium  40 mg Per Tube Q24H  . polyethylene glycol  17 g Oral Daily  . risperiDONE  1 mg Oral BID    Time spent on care of this patient: no charge   [facility] T , MD   [facility]  Office  [phone] Pager - Text Page per Loretha StaplerAmion as per below:  On-Call/Text Page:      [email or web address]      password [email or web address]  If 7PM-7AM, please contact night-coverage [email or web address] Password [email or web address] 06/28/2015, 1:24 PM   LOS: 16 days

## 2015-06-28 NOTE — Progress Notes (Addendum)
Nutrition Follow-up  DOCUMENTATION CODES:   Morbid obesity  INTERVENTION:    Continue Vital High Protein at goal rate of 90 ml/hr  TF regimen providing 2160 kcals, 189 gm protein, 1806 ml free water daily  NUTRITION DIAGNOSIS:   Inadequate oral intake related to inability to eat as evidenced by NPO status, ongoing  GOAL:   Provide needs based on ASPEN/SCCM guidelines, met  MONITOR:   Vent status, TF tolerance, Labs, Weight trends, I & O's  ASSESSMENT:   34 y.o. male with a history of Morbid Obesity, DM2, HTN, OSA who presented to the ED with complaints of colicky RUQ ABD Pain x 2-3 days. He denies any Nausea and Vomiting or Diarrhea, He reports his last BM was 3 days ago. He was evaluated in the ED and was found to have hypoxemia with O2sats in the 80's. He was also found to have hypercarbia with a pCO2 = 60.8. Due to weight constraints of the CT scanner a CTA of the Chest And ABD was not able to be performed and he was transferred for further workup.   Patient s/p procedure 11/21: TRACHEOSTOMY  Patient currently on ATC, however, still on and off vent support MV: 7.7 L/min Temp (24hrs), Avg:98.9 F (37.2 C), Min:98.7 F (37.1 C), Max:99.1 F (37.3 C)   Vital High Protein formula infusing via CORTRAK feeding tube (tip in duodenum) at 90 ml/hr providing 2160 kcals, 189 gm protein, 1806 ml of free water.    FEES planned for today.  Diet Order:  Diet NPO time specified  Skin:  Reviewed, no issues  Last BM:  11/28  Height:   Ht Readings from Last 1 Encounters:  06/13/15 $RemoveB'5\' 10"'hfLyBNTE$  (1.778 m)    Weight:   Wt Readings from Last 1 Encounters:  06/28/15 474 lb (215.005 kg)    Ideal Body Weight:  75.5 kg  BMI:  Body mass index is 68.01 kg/(m^2).  Estimated Nutritional Needs:   Kcal:  2170-2762  Protein:  189 gm  Fluid:  2.2-2.7 L  EDUCATION NEEDS:   No education needs identified at this time  Arthur Holms, RD, LDN Pager #:  [phone] After-Hours Pager #: [phone]

## 2015-06-29 ENCOUNTER — Encounter (HOSPITAL_COMMUNITY): Payer: Self-pay | Admitting: Physical Medicine and Rehabilitation

## 2015-06-29 ENCOUNTER — Inpatient Hospital Stay (HOSPITAL_COMMUNITY): Payer: 59

## 2015-06-29 DIAGNOSIS — J189 Pneumonia, unspecified organism: Secondary | ICD-10-CM | POA: Insufficient documentation

## 2015-06-29 DIAGNOSIS — R5381 Other malaise: Secondary | ICD-10-CM

## 2015-06-29 LAB — CBC WITH DIFFERENTIAL/PLATELET
BASOS ABS: 0 10*3/uL (ref 0.0–0.1)
BASOS PCT: 0 %
EOS ABS: 0.3 10*3/uL (ref 0.0–0.7)
EOS PCT: 3 %
HEMATOCRIT: 36.5 % — AB (ref 39.0–52.0)
Hemoglobin: 10.9 g/dL — ABNORMAL LOW (ref 13.0–17.0)
Lymphocytes Relative: 28 %
Lymphs Abs: 2.6 10*3/uL (ref 0.7–4.0)
MCH: 27.6 pg (ref 26.0–34.0)
MCHC: 29.9 g/dL — ABNORMAL LOW (ref 30.0–36.0)
MCV: 92.4 fL (ref 78.0–100.0)
MONO ABS: 0.4 10*3/uL (ref 0.1–1.0)
Monocytes Relative: 4 %
NEUTROS ABS: 5.9 10*3/uL (ref 1.7–7.7)
Neutrophils Relative %: 65 %
PLATELETS: 285 10*3/uL (ref 150–400)
RBC: 3.95 MIL/uL — ABNORMAL LOW (ref 4.22–5.81)
RDW: 13.1 % (ref 11.5–15.5)
WBC: 9.3 10*3/uL (ref 4.0–10.5)

## 2015-06-29 LAB — COMPREHENSIVE METABOLIC PANEL
ALBUMIN: 2.6 g/dL — AB (ref 3.5–5.0)
ALK PHOS: 86 U/L (ref 38–126)
ALT: 42 U/L (ref 17–63)
ANION GAP: 7 (ref 5–15)
AST: 42 U/L — ABNORMAL HIGH (ref 15–41)
BILIRUBIN TOTAL: 0.5 mg/dL (ref 0.3–1.2)
BUN: 29 mg/dL — ABNORMAL HIGH (ref 6–20)
CALCIUM: 9.2 mg/dL (ref 8.9–10.3)
CO2: 40 mmol/L — ABNORMAL HIGH (ref 22–32)
Chloride: 95 mmol/L — ABNORMAL LOW (ref 101–111)
Creatinine, Ser: 0.98 mg/dL (ref 0.61–1.24)
GFR calc Af Amer: >60 mL/min (ref >60)
GFR calc non Af Amer: >60 mL/min (ref >60)
GLUCOSE: 143 mg/dL — AB (ref 65–99)
Potassium: 4 mmol/L (ref 3.5–5.1)
Sodium: 142 mmol/L (ref 135–145)
TOTAL PROTEIN: 7 g/dL (ref 6.5–8.1)

## 2015-06-29 LAB — GLUCOSE, CAPILLARY
GLUCOSE-CAPILLARY: 139 mg/dL — AB (ref 65–99)
GLUCOSE-CAPILLARY: 135 mg/dL — AB (ref 65–99)
Glucose-Capillary: 178 mg/dL — ABNORMAL HIGH (ref 65–99)
Glucose-Capillary: 116 mg/dL — ABNORMAL HIGH (ref 65–99)
Glucose-Capillary: 164 mg/dL — ABNORMAL HIGH (ref 65–99)

## 2015-06-29 LAB — MAGNESIUM: MAGNESIUM: 1.8 mg/dL (ref 1.7–2.4)

## 2015-06-29 MED ORDER — METHADONE HCL 5 MG PO TABS
2.5000 mg | ORAL_TABLET | Freq: Every day | ORAL | Status: DC
  Administered 2015-06-30: 2.5 mg via ORAL
  Filled 2015-06-29: qty 1

## 2015-06-29 MED ORDER — RISPERIDONE 1 MG/ML PO SOLN
0.5000 mg | Freq: Every day | ORAL | Status: DC
  Filled 2015-06-29: qty 0.5

## 2015-06-29 MED ORDER — FUROSEMIDE 10 MG/ML IJ SOLN
60.0000 mg | Freq: Three times a day (TID) | INTRAMUSCULAR | Status: DC
  Administered 2015-06-29 – 2015-06-30 (×3): 60 mg via INTRAVENOUS
  Filled 2015-06-29 (×3): qty 6

## 2015-06-29 NOTE — Progress Notes (Signed)
PULMONARY / CRITICAL CARE MEDICINE   Name: Derrick [name] MRN: [id number] DOB: [birth year]    ADMISSION DATE:  06/12/2015 CONSULTATION DATE:  06/13/2015  REFERRING MD :  TRH  CHIEF COMPLAINT:  Hypercarbic respiratory failure and AMS  INITIAL PRESENTATION: 34 year old male with morbid obesity, OSA and OHV who presents to PCCM with AMS.  ABG was ordered and patient was noted to be hypercarbic and with respiratory acidosis.  Subsequently the patient developed hypoxemia as well.  Patient was transferred to the ICU and BiPAP was started.  F/U ABG post BiPAP showed no evidence of improvement and mental status continued to deteriorate so decision was made to intubate.  STUDIES:  CXR 11/13 >> with cardiomegally and pulmonary edema. Venous doppler 11/13 >> without DVT, superficial thrombosis, or Baker's cyst CXR 11/14 >> Hypoventilation with bibasilar atelectasis.  TTE 11/14 >> Mod concentric LV hypertrophy, EF 55-60%, grade 2 diastolic dysfunction, can't assess PV.  CXR 11/15 >> Slight interval deterioration in the pulmonary interstitium with increased pulmonary vascular prominence. Developing left lower lobe atelectasis is suspected.  CXR 11/17 >> Worsening pulmonary edema.  CXR 11/18 >> Slight improvement in pulmonary vascular congestion. Persistent bibasilar atelectasis or pneumonia CXR 11/19 >> Persistent mild lung base atelectasis and small effusions without pulmonary edema. CXR 11/21 >> Low lung vol, bibasilar atelectasis and small effusions Bronchoscopy 11/22 >> Impressive thin grey secretions with complete collapse rt main and all lobes rt and partial 50% left main  SIGNIFICANT EVENTS: 11/13  intubation for hypercarbic/hypoxic respiratory failure. 11/14  transitioned from Lovenox gtt to heparin gtt 11/15  removed PA catheter and replaced with CVL, decrease Lasix gtt  11/17  transitioned to intermittent Lasix 11/20  T 102.5, pan culture and Levaquin  11/22  bronchoscopy,  tracheostomy in OR by ENT 11/23  severe delirium  11/24  improved with new cocktail neuro, off drips  SUBJECTIVE:   Did 24 hr TC   VITAL SIGNS: Temp:  [97.4 F (36.3 C)-99.7 F (37.6 C)] 98.6 F (37 C) (11/29 0820) Pulse Rate:  [91-103] 93 (11/29 0820) Resp:  [18-27] 23 (11/29 0820) BP: (125-155)/(75-97) 155/97 mmHg (11/29 0820) SpO2:  [92 %-100 %] 94 % (11/29 0820) FiO2 (%):  [40 %-60 %] 40 % (11/29 0733) Weight:  [217.273 kg (479 lb)] 217.273 kg (479 lb) (11/29 0500)   HEMODYNAMICS:     VENTILATOR SETTINGS: Vent Mode:  [-]  FiO2 (%):  [40 %-60 %] 40 %   INTAKE / OUTPUT:  Intake/Output Summary (Last 24 hours) at 06/29/15 1202 Last data filed at 06/29/15 1010  Gross per 24 hour  Intake   1810 ml  Output   3100 ml  Net  -1290 ml    PHYSICAL EXAMINATION: General:  Obese male in NAD in chair Neuro:  AAOx4, nonfocal, calm Cardiovascular:  s1 s2 RRR distant Lungs: reduced Abdomen:  Soft, obese, ND and +BS. Musculoskeletal:  No edema Skin:  Intact, no new rashes  LABS:  CBC  Recent Labs Lab 06/27/15 0236 06/28/15 0205 06/29/15 0330  WBC 8.8 9.0 9.3  HGB 10.9* 11.1* 10.9*  HCT 36.4* 36.7* 36.5*  PLT 238 252 285   Coag's No results for input(s): APTT, INR in the last 168 hours. BMET  Recent Labs Lab 06/27/15 0236 06/28/15 0205 06/29/15 0330  NA 145 145 142  K 3.8 3.9 4.0  CL 101 99* 95*  CO2 38* 39* 40*  BUN 26* 28* 29*  CREATININE 0.83 0.83 0.98  GLUCOSE 216* 217*  143*   Electrolytes  Recent Labs Lab 06/27/15 0236 06/28/15 0205 06/29/15 0330  CALCIUM 9.0 9.2 9.2  MG 1.8 1.7 1.8   Sepsis Markers No results for input(s): LATICACIDVEN, PROCALCITON, O2SATVEN in the last 168 hours.   ABG No results for input(s): PHART, PCO2ART, PO2ART in the last 168 hours.   Liver Enzymes  Recent Labs Lab 06/27/15 0236 06/28/15 0205 06/29/15 0330  AST 31 33 42*  ALT 30 35 42  ALKPHOS 67 73 86  BILITOT 0.2* 0.4 0.5  ALBUMIN 2.3* 2.4* 2.6*    Cardiac Enzymes No results for input(s): TROPONINI, PROBNP in the last 168 hours.   Glucose  Recent Labs Lab 06/28/15 1221 06/28/15 1654 06/28/15 2027 06/28/15 2352 06/29/15 0413 06/29/15 0819  GLUCAP 161* 164* 147* 121* 139* 135*    Imaging Dg Chest Port 1 View  06/29/2015  CLINICAL DATA:  Pneumonia. EXAM: PORTABLE CHEST 1 VIEW COMPARISON:  06/24/2015. FINDINGS: Tracheostomy tube in good anatomic position. Cardiomegaly with normal pulmonary vascularity. Low lung volumes with progressive bibasilar atelectasis and/or infiltrate. Small pleural effusions cannot be excluded. No pneumothorax. IMPRESSION: 1. Tracheostomy tube in stable position. 2. Stable severe cardiomegaly.  No pulmonary venous congestion. 3. Low lung volumes with progressive bibasilar atelectasis and/or infiltrate. Small bilateral pleural effusions cannot be excluded. Electronically Signed   By: Maisie Fushomas  Register   On: 06/29/2015 07:27     ASSESSMENT / PLAN:  PULMONARY OETT 11/13 >> 11/22 Trach 11/22 >> A:  Hypercarbic VDRF - due to morbid obesity, OSA, OHV and CHF. PE less likely given history COLLAPSE RLL s/p broncho with lavage on 11/22 - resolved 11/24 Concerns for pneumonia VAP P:   - ATC successful to 24 hr, maintain off vent -dc vent from room -with any trach change by ENT, would change to cuffless -xray continues to improve -was neg 900 cc, maintain lasix -eating now, cuff down recommended -PMV increase daytime use  RENAL A:   Hypernatremia - resolved  P:   - d5w kvo - Trend BMP for na rise on lasix  GASTROINTESTINAL A:   Nutrition, aspiration TF in past? P:   - post pyloric feeding - dc - SLP following, pending swallow evaluation 11/28 - passed, on diet - Protonix daily  NEUROLOGIC A:   AMS due to hypercarbia, severe agitation, delirium  - improved P:   RASS goal: 0 Methadone 2.5 mg PO to daily x 2 days then dc Risperdal to 2 mg BID - keep but reduce further, liikley dc in 2  days Push PT efforts     [name] AliasFeinstein, MD, FACP Pgr: [phone] [facility]

## 2015-06-29 NOTE — Progress Notes (Signed)
Patient resting comfortably on ATC with no apparent signs of respiratory distress. Patient not placed on ventilator at this time but at bedside if needed. RT will continue to monitor throughout the night.

## 2015-06-29 NOTE — Consult Note (Addendum)
Physical Medicine and Rehabilitation Consult  Reason for Consult: Debility Referring Physician: Joseph Art   HPI: Derrick [name] is a 34 y.o. male with history of DM type 2, Morbid obesity, OSA, OSV who was admitted on 06/12/15 with hypercarbic respiratory failure and placed on BIPAP for support. BLE dopplers negative for DVT and unable to do chest CT due to body habitus.  He with developed hypoxemia with MS changes requiring intubation on 11/13. He was noted to have pulmonary edema secondary to CHF due to morbid obesity and was treated with diuresis. 2 D echo done revealing moderate concentric hypertrophy with EF 55-60%, grade 2 diastolic dysfunction and no wall abnormality.  He had difficulty with vent wean and underwent bronchoscopy for collapsed RLL with lavage and tracheostomy by Dr. Suszanne Conners on 11/22.  He developed fevers due to HCAP and was started on IV antibiotics for coverage.  Agitation due to delirium has resolved and he has been tolerating TC X 24 hours with PMSV with ST.  FEES done yesterday and he was started on regular diet. Trach to be changed out next week.  Therapy ongoing and CIR recommended due to deconditioned state.    Review of Systems  HENT: Negative for hearing loss.   Eyes: Negative for blurred vision and double vision.  Respiratory: Positive for cough and sputum production. Negative for shortness of breath and wheezing.   Cardiovascular: Negative for chest pain and palpitations.  Gastrointestinal: Negative for heartburn, nausea, abdominal pain and diarrhea.  Musculoskeletal: Positive for back pain (occasionally.). Negative for myalgias.  Neurological: Positive for speech change and weakness. Negative for dizziness, tingling, sensory change, focal weakness and headaches.  Psychiatric/Behavioral: The patient is not nervous/anxious and does not have insomnia.       Past Medical History  Diagnosis Date  . Hypertension   . Reflux   . Diabetes mellitus without  complication (HCC)   . Acute on chronic diastolic CHF (congestive heart failure), NYHA class 1 (HCC)     Past Surgical History  Procedure Laterality Date  . Tracheostomy tube placement N/A 06/21/2015    Procedure: TRACHEOSTOMY;  Surgeon: Newman Pies, MD;  Location: [facility] OR;  Service: ENT;  Laterality: N/A;    Family History  Problem Relation Age of Onset  . Diabetes Maternal Grandmother   . Hypertension Maternal Grandmother       Social History:   Lives with grandmother. Mother in town can check in after discharge.  Is a musician. He reports that he has never smoked. He has never used smokeless tobacco. He reports that he does not drink alcohol or use illicit drugs.    Allergies  Allergen Reactions  . Vancomycin Other (See Comments)  . Zosyn [Piperacillin Sod-Tazobactam So] Other (See Comments)    Medications Prior to Admission  Medication Sig Dispense Refill  . albuterol (PROVENTIL HFA;VENTOLIN HFA) 108 (90 BASE) MCG/ACT inhaler Inhale 2 puffs into the lungs every 2 (two) hours as needed for wheezing or shortness of breath (cough). 1 Inhaler 0  . allopurinol (ZYLOPRIM) 300 MG tablet Take 300 mg by mouth daily.    Marland Kitchen aspirin 81 MG tablet Take 81 mg by mouth daily.    . furosemide (LASIX) 40 MG tablet Take 40 mg by mouth daily.    Marland Kitchen glimepiride (AMARYL) 4 MG tablet Take 4 mg by mouth daily with breakfast.    . lisinopril-hydrochlorothiazide (PRINZIDE,ZESTORETIC) 20-25 MG per tablet Take 1 tablet by mouth daily.    Marland Kitchen loratadine (CLARITIN)  10 MG tablet Take 10 mg by mouth daily.      Home: Home Living Family/patient expects to be discharged to:: Private residence Living Arrangements: Spouse/significant other, Parent, Other relatives Available Help at Discharge: Family Type of Home: House Home Access: Stairs to enter Secretary/administratorntrance Stairs-Number of Steps: 4 Entrance Stairs-Rails: Right Home Equipment: Cane - single point, Environmental consultantWalker - 2 wheels  Functional History: Prior Function Level of  Independence: Independent Comments: Drives.  Functional Status:  Mobility: Bed Mobility Overal bed mobility: Needs Assistance Bed Mobility: Supine to Sit Supine to sit: Max assist, +2 for physical assistance, +2 for safety/equipment, HOB elevated, Mod assist General bed mobility comments: Cues to bring BLEs to EOB and assist to elevate trunk. Cues for technique.Incr time.  Transfers Overall transfer level: Needs assistance Equipment used: Rolling walker (2 wheeled) Transfers: Sit to/from Stand Sit to Stand: Min assist, +2 physical assistance, +2 safety/equipment, From elevated surface Stand pivot transfers: Max assist, +2 physical assistance, +2 safety/equipment General transfer comment: Pt needed cues for proper foot/hand placement to RW.   Ambulation/Gait Ambulation/Gait assistance: Min assist, +2 physical assistance, Mod assist, +2 safety/equipment Ambulation Distance (Feet): 20 Feet (15 feet and then 5 feet) Assistive device: Rolling walker (2 wheeled) Gait Pattern/deviations: Step-through pattern, Decreased stride length, Wide base of support, Drifts right/left General Gait Details: Pt able toambulate with standing rest breaks occasionally.  Pt needed cues to stay close to RW and for upright stance as pt flexes when gets fatigued.  Followed pt with chair as he has knee instability.  Pt rested 5 min in chair and walked another 5 feet.  Needs assist to control descent into chair.  Gait velocity interpretation: Below normal speed for age/gender    ADL:    Cognition: Cognition Overall Cognitive Status: Difficult to assess Orientation Level: Oriented X4 Cognition Arousal/Alertness: Awake/alert Behavior During Therapy: WFL for tasks assessed/performed Overall Cognitive Status: Difficult to assess Difficult to assess due to: Tracheostomy  Blood pressure 123/84, pulse 99, temperature 99.3 F (37.4 C), temperature source Oral, resp. rate 24, height 5\' 10"  (1.778 m), weight 217.273  kg (479 lb), SpO2 90 %. Physical Exam  Nursing note and vitals reviewed. Constitutional: He is oriented to person, place, and time. He appears well-developed and well-nourished.  Morbidly obese male, NAD  HENT:  Head: Normocephalic and atraumatic.  Mouth/Throat: Oropharynx is clear and moist.  Eyes: Conjunctivae are normal. Pupils are equal, round, and reactive to light.  Neck:  Cuffed Trach in place. Minimal blood tinged secretions noted.  Cardiovascular: Normal rate and regular rhythm.   No murmur heard. Respiratory: Effort normal. No accessory muscle usage. No respiratory distress. He has decreased breath sounds. He has wheezes. He exhibits no tenderness.  Upper airway sounds due to tracheal secretions.   GI: Soft. Bowel sounds are normal. He exhibits no distension. There is no tenderness.  Musculoskeletal: He exhibits no tenderness.  Neurological: He is alert and oriented to person, place, and time.  Able to phonate with trach occluded. Follows basic commands without difficulty. UE 4+ to 5/5 prox to distal. LE: 2/5 HF, 3/5 KE and 3+ bilateral ADF/APF  Skin: Skin is warm and dry. No rash noted. No erythema.  Psychiatric: He has a normal mood and affect. His behavior is normal. Thought content normal.    Results for orders placed or performed during the hospital encounter of 06/12/15 (from the past 24 hour(s))  Glucose, capillary     Status: Abnormal   Collection Time: 06/28/15  4:54 PM  Result Value Ref Range   Glucose-Capillary 164 (H) 65 - 99 mg/dL  Glucose, capillary     Status: Abnormal   Collection Time: 06/28/15  8:27 PM  Result Value Ref Range   Glucose-Capillary 147 (H) 65 - 99 mg/dL  Glucose, capillary     Status: Abnormal   Collection Time: 06/28/15 11:52 PM  Result Value Ref Range   Glucose-Capillary 121 (H) 65 - 99 mg/dL  Comprehensive metabolic panel     Status: Abnormal   Collection Time: 06/29/15  3:30 AM  Result Value Ref Range   Sodium 142 135 - 145 mmol/L     Potassium 4.0 3.5 - 5.1 mmol/L   Chloride 95 (L) 101 - 111 mmol/L   CO2 40 (H) 22 - 32 mmol/L   Glucose, Bld 143 (H) 65 - 99 mg/dL   BUN 29 (H) 6 - 20 mg/dL   Creatinine, Ser 8.46 0.61 - 1.24 mg/dL   Calcium 9.2 8.9 - 96.2 mg/dL   Total Protein 7.0 6.5 - 8.1 g/dL   Albumin 2.6 (L) 3.5 - 5.0 g/dL   AST 42 (H) 15 - 41 U/L   ALT 42 17 - 63 U/L   Alkaline Phosphatase 86 38 - 126 U/L   Total Bilirubin 0.5 0.3 - 1.2 mg/dL   GFR calc non Af Amer >60 >60 mL/min   GFR calc Af Amer >60 >60 mL/min   Anion gap 7 5 - 15  CBC with Differential/Platelet     Status: Abnormal   Collection Time: 06/29/15  3:30 AM  Result Value Ref Range   WBC 9.3 4.0 - 10.5 K/uL   RBC 3.95 (L) 4.22 - 5.81 MIL/uL   Hemoglobin 10.9 (L) 13.0 - 17.0 g/dL   HCT 95.2 (L) 84.1 - 32.4 %   MCV 92.4 78.0 - 100.0 fL   MCH 27.6 26.0 - 34.0 pg   MCHC 29.9 (L) 30.0 - 36.0 g/dL   RDW 40.1 02.7 - 25.3 %   Platelets 285 150 - 400 K/uL   Neutrophils Relative % 65 %   Neutro Abs 5.9 1.7 - 7.7 K/uL   Lymphocytes Relative 28 %   Lymphs Abs 2.6 0.7 - 4.0 K/uL   Monocytes Relative 4 %   Monocytes Absolute 0.4 0.1 - 1.0 K/uL   Eosinophils Relative 3 %   Eosinophils Absolute 0.3 0.0 - 0.7 K/uL   Basophils Relative 0 %   Basophils Absolute 0.0 0.0 - 0.1 K/uL  Magnesium     Status: None   Collection Time: 06/29/15  3:30 AM  Result Value Ref Range   Magnesium 1.8 1.7 - 2.4 mg/dL  Glucose, capillary     Status: Abnormal   Collection Time: 06/29/15  4:13 AM  Result Value Ref Range   Glucose-Capillary 139 (H) 65 - 99 mg/dL  Glucose, capillary     Status: Abnormal   Collection Time: 06/29/15  8:19 AM  Result Value Ref Range   Glucose-Capillary 135 (H) 65 - 99 mg/dL  Glucose, capillary     Status: Abnormal   Collection Time: 06/29/15 12:07 PM  Result Value Ref Range   Glucose-Capillary 178 (H) 65 - 99 mg/dL   Dg Chest Port 1 View  06/29/2015  CLINICAL DATA:  Pneumonia. EXAM: PORTABLE CHEST 1 VIEW COMPARISON:  06/24/2015.  FINDINGS: Tracheostomy tube in good anatomic position. Cardiomegaly with normal pulmonary vascularity. Low lung volumes with progressive bibasilar atelectasis and/or infiltrate. Small pleural effusions cannot be excluded. No pneumothorax. IMPRESSION: 1. Tracheostomy  tube in stable position. 2. Stable severe cardiomegaly.  No pulmonary venous congestion. 3. Low lung volumes with progressive bibasilar atelectasis and/or infiltrate. Small bilateral pleural effusions cannot be excluded. Electronically Signed   By: Maisie Fus  Register   On: 06/29/2015 07:27    Assessment/Plan: Diagnosis: encephalopathy, weakness, impaired balance and functional deficits secondary to debility after acute respiratory failure/CHF 1. Does the need for close, 24 hr/day medical supervision in concert with the patient's rehab needs make it unreasonable for this patient to be served in a less intensive setting? Yes 2. Co-Morbidities requiring supervision/potential complications: morbid obesity, htn, dm 3. Due to bladder management, bowel management, safety, skin/wound care, disease management, medication administration, pain management and patient education, does the patient require 24 hr/day rehab nursing? Yes 4. Does the patient require coordinated care of a physician, rehab nurse, PT (1-2 hrs/day, 5 days/week), OT (1-2 hrs/day, 5 days/week) and SLP (1-2 hrs/day, 5 days/week) to address physical and functional deficits in the context of the above medical diagnosis(es)? Yes Addressing deficits in the following areas: balance, endurance, locomotion, strength, transferring, bowel/bladder control, bathing, dressing, feeding, grooming, toileting, speech, swallowing and psychosocial support 5. Can the patient actively participate in an intensive therapy program of at least 3 hrs of therapy per day at least 5 days per week? Yes 6. The potential for patient to make measurable gains while on inpatient rehab is excellent 7. Anticipated  functional outcomes upon discharge from inpatient rehab are modified independent  with PT, modified independent with OT, modified independent with SLP. 8. Estimated rehab length of stay to reach the above functional goals is: 7-9 days 9. Does the patient have adequate social supports and living environment to accommodate these discharge functional goals? Yes 10. Anticipated D/C setting: Home 11. Anticipated post D/C treatments: HH therapy and Outpatient therapy 12. Overall Rehab/Functional Prognosis: excellent  RECOMMENDATIONS: This patient's condition is appropriate for continued rehabilitative care in the following setting: CIR Patient has agreed to participate in recommended program. Yes Note that insurance prior authorization may be required for reimbursement for recommended care.  Comment: Rehab Admissions Coordinator to follow up.  Thanks,  Ranelle Oyster, MD, [place] [facility]     06/29/2015

## 2015-06-29 NOTE — Progress Notes (Signed)
Speech Language Pathology Treatment: Dysphagia;Passy Muir Speaking valve  Patient Details Name: Derrick HansenReginald A [name] MRN: [id number] DOB: 1980/11/07 Today's Date: 06/29/2015 Time: [id number] SLP Time Calculation (min) (ACUTE ONLY): 9 min  Assessment / Plan / Recommendation Clinical Impression  PMSV (trach collar) and dysphagia intervention. Observation with snack with PMSV in place. Moderate verbal reminders to alternate solids and liquids. No s/s aspiration however pt's O2 sats fell to 79-91% (reliable wave form on monitor). RN called who reported pt given meds which may make him sleepy and therefore effect respirations. Pt not complaining of respiratory difficulty. Valve doffed to allow pt to rest. Continue regular texture/thin liquids with PMSV donned during meals/snacks and continue ST intervention. Trach planned to downsize to # 6 keeping cuff due to nocturnal vent when needed.   HPI HPI: 34 year old male with morbid obesity, OSA and OHV who presents to PCCM with AMS. ABG was ordered and patient was noted to be hypercarbic and with respiratory acidosis. Subsequently the patient developed hypoxemia as well. Patient was transferred to the ICU and BiPAP was started. F/U ABG post BiPAP showed no evidence of improvement and mental status continued to deteriorate so decision was made to intubate. Pt was intubated 11/13-11/22, at which time trach was placed.      SLP Plan  Continue with current plan of care     Recommendations  Diet recommendations: Regular;Thin liquid Liquids provided via: Straw;Cup Medication Administration: Whole meds with puree Supervision: Patient able to self feed;Intermittent supervision to cue for compensatory strategies Compensations: Slow rate;Small sips/bites;Follow solids with liquid Postural Changes and/or Swallow Maneuvers: Seated upright 90 degrees      Patient may use Passy-Muir Speech Valve: with SLP only PMSV Supervision: Full       Oral Care  Recommendations: Oral care QID Follow up Recommendations:  (tbd) Plan: Continue with current plan of care   Royce MacadamiaLitaker, Romone Shaff Willis 06/29/2015, 12:21 PM  Breck CoonsLisa Willis Lonell FaceLitaker M.Ed [employer]-SLP Pager [phone]

## 2015-06-29 NOTE — Progress Notes (Signed)
Pt OOB in chair (assist X 2) for 5 hours. Continues to have pain while urinating, 150- 200 max and blood tingled. Tolerating diet, desats intermittently , recovering when passe muir is removed.

## 2015-06-29 NOTE — Progress Notes (Signed)
Derrick [name] - Stepdown/ICU TEAM Progress Note  Derrick [name] ZOX:[id number] DOB: 01-12-1981 DOA: 06/12/2015 PCP: [facility]  Admit HPI / Brief Narrative: 34 year BM PMHx DM type 2 Uncontrolled, HTN with Morbid Obesity, OSA/OHS   Presents to PCCM with AMS. ABG was ordered and patient was noted to be hypercarbic and with respiratory acidosis. Subsequently the patient developed hypoxemia as well. Patient was transferred to the ICU and BiPAP was started. F/U ABG post BiPAP showed no evidence of improvement and mental status continued to deteriorate so decision was made to intubate.   HPI/Subjective: 11/29 A/O 4, sitting in chair comfortably.    Assessment/Plan: Acute hypoxemic and hypercarbic respiratory failure/VDRF -Multifactorial including VAP, CHF/pulm edema, OSA, OHS, Rt-lung collapse S/P lavage on 11/22 - resolved 11/24 -vent management per pulmonary/CCM -continue ceftriaxone--total antibiotics day #5 -06/20/2015 Blood cultures remain negative -Patient now on PMV during the day with blow-by as needed. Patient's trach scheduled for down sizing today  Acute on chronic diastolic CHF/Pulmonary Hypertension -Strict in and out since admission + 4.2 L -Daily weight bed; 11/29= 217.2 kg -06/14/2015 echocardiogram. 55-60%, grade 2 diastolic dysfunction -Increase Lasix 60 mg TID -Evaluation for CIR pending  Acute kidney injury -Serum creatinine peaked at 2.11--> 0.83--> 0.98 -Resolved  Diabetes mellitus type 2 -06/13/2015 Hemoglobin A1c =10.[name] -Increase Lantus 45 units daily -Resistant SSI  Acute encephalopathy -Multifactorial including ICU delirium, metabolic derangement, infection, hypercarbia, medications -Appears to have resolved as patient is following all commands and nodding yes and no to questions. -Decrease Methadone to 2.5 mg BID and monitor QTC -Continue Risperdal 2 mg BID  Dysphagia -Patient will require Fiberoptic  endoscopic evaluation of swallowing(FEES ) will be performed Monday -If patient passes will remove NG tube  Hypernatremia -Resolved (upper limits of normal) - decrease  To 100 ml free water QID via NG tube  Nutrition, aspiration  Per NG tube  DM Type 2 Uncontrolled.  -11/13 hemoglobin A1c = 10.[name] - Continue resistant SSI  -Continue Lantus to 45 units QHS  Hypokalemia  -Potassium goal > 4      Code Status: FULL Family Communication:  family present at time of exam Disposition Plan: Resolution respiratory failure    Consultants:   Procedure/Significant Events: CXR 11/13- with cardiomegally and pulmonary edema. Venous doppler 11/13- without DVT, superficial thrombosis, or Baker's cyst CXR 11/14- Hypoventilation with bibasilar atelectasis.  TTE 11/14- Mod concentric LV hypertrophy, EF 55-60%, grade 2 diastolic dysfunction, can't assess PV.  CXR 11/15- Slight interval deterioration in the pulmonary interstitium with increased pulmonary vascular prominence. Developing left lower lobe atelectasis is suspected.  CXR 11/17- Worsening pulmonary edema.  CXR 11/18- Slight improvement in pulmonary vascular congestion. Persistent bibasilar atelectasis or pneumonia CXR 11/19- Persistent mild lung base atelectasis and small effusions without pulmonary edema. CXR 11/21- Low lung vol, bibasilar atelectasis and small effusions Bronchoscopy 11/22- Impressive thin grey secretions with complete collapse rt main and all lobes rt and partial 50% left main  SIGNIFICANT EVENTS: 11/13 intubation for hypercarbic/hypoxic respiratory failure. 11/14- transitioned from Lovenox gtt to heparin gtt 11/15- removed PA catheter and replaced with CVL, decrease Lasix gtt  11/17- transitioned to intermittent Lasix 11/20- T 102.5, pan culture and Levaquin  11/22- bronchoscopy, tracheostomy in OR by ENT 11/23- severe delirium  11/24- improved with new cocktail neuro, off drips   Culture 11/20  blood left  AC/right hand negative final  11/20 urine positive multiple species   11/20 tracheal aspirate normal flora  11/21  BAL normal flora    Antibiotics: Ceftaz 11/22>>11/24 Linazolid 11/22>>11/23 Levaquin 11/20>> 11/22 Ceftriaxone to 11/24 >> 11/29    DVT prophylaxis: Subcutaneous heparin   Devices Tracheostomy in place   LINES / TUBES:  OETT 11/13>>>11/22 R IJ PAC 11/13>>>11/5, replaced on 11/15 with CVL Trach 11/22>>  Continuous Infusions: . sodium chloride 10 mL/hr at 06/21/15 1936    Objective: VITAL SIGNS: Temp: 98.6 F (37 C) (11/29 0820) Temp Source: Oral (11/29 0820) BP: 155/97 mmHg (11/29 0820) Pulse Rate: 93 (11/29 0820) SPO2; FIO2:   Intake/Output Summary (Last 24 hours) at 06/29/15 0834 Last data filed at 06/29/15 0635  Gross per 24 hour  Intake   1800 ml  Output   2800 ml  Net  -1000 ml     Exam: General:A/O 4, sitting in chair comfortably. Patient now tolerating PMV asks and answers questions appropriately  write questions on paper Eyes: negative scleral hemorrhage ENT: Negative Runny negative gingival bleeding, Neck:  Negative scars, masses, torticollis, lymphadenopathy, JVD, trachea present without discharge or signs of infection Lungs: diffuse rhonchi without wheezes or crackles Cardiovascular: Regular rate and rhythm without murmur gallop or rub normal S1 and S2 Abdomen: Morbidly Morbidly obese, negative abdominal pain,positive soft, bowel sounds, no rebound, no ascites, no appreciable mass Extremities: No significant cyanosis, clubbing, or edema bilateral lower extremities Psychiatric:  Negative depression, negative anxiety, negative fatigue, negative mania  Neurologic:  Cranial nerves II through XII intact, tongue/uvula midline, all extremities muscle strength 5/5, sensation intact throughout, unable to assess dysarthria, negative expressive aphasia, negative receptive aphasia.   Data Reviewed: Basic Metabolic  Panel:  Recent Labs Lab 06/25/15 0437 06/26/15 0530 06/26/15 1600 06/27/15 0236 06/28/15 0205 06/29/15 0330  NA 144 145  --  145 145 142  K 3.5 3.3* 4.0 3.8 3.9 4.0  CL 101 103  --  101 99* 95*  CO2 38* 38*  --  38* 39* 40*  GLUCOSE 291* 228*  --  216* 217* 143*  BUN 30* 26*  --  26* 28* 29*  CREATININE 0.95 0.89  --  0.83 0.83 0.98  CALCIUM 8.9 9.0  --  9.0 9.2 9.2  MG  --   --  [name].9 [name].8 [name].7 [name].8   Liver Function Tests:  Recent Labs Lab 06/27/15 0236 06/28/15 0205 06/29/15 0330  AST 31 33 42*  ALT 30 35 42  ALKPHOS 67 73 86  BILITOT 0.2* 0.4 0.5  PROT 6.9 6.9 7.0  ALBUMIN 2.3* 2.4* 2.6*   No results for input(s): LIPASE, AMYLASE in the last 168 hours. No results for input(s): AMMONIA in the last 168 hours. CBC:  Recent Labs Lab 06/24/15 0340 06/25/15 0437 06/26/15 0530 06/27/15 0236 06/28/15 0205 06/29/15 0330  WBC 10.[name] 7.9 9.[name] 8.8 9.0 9.3  NEUTROABS 7.4  --   --  6.0 5.8 5.9  HGB 10.9* 10.8* 10.8* 10.9* 11.[name]* 10.9*  HCT 36.6* 36.9* 35.9* 36.4* 36.7* 36.5*  MCV 94.6 94.9 94.5 95.0 93.4 92.4  PLT 216 205 213 238 252 285   Cardiac Enzymes: No results for input(s): CKTOTAL, CKMB, CKMBINDEX, TROPONINI in the last 168 hours. BNP (last 3 results)  Recent Labs  06/12/15 1539  BNP 86.7    ProBNP (last 3 results) No results for input(s): PROBNP in the last 8760 hours.  CBG:  Recent Labs Lab 06/28/15 1221 06/28/15 1654 06/28/15 2027 06/28/15 2352 06/29/15 0413  GLUCAP 161* 164* 147* 121* 139*    Recent Results (from the past 240 hour(s))  Culture, blood (routine x 2)     Status: None   Collection Time: 06/20/15  9:44 PM  Result Value Ref Range Status   Specimen Description BLOOD LEFT ANTECUBITAL  Final   Special Requests BOTTLES DRAWN AEROBIC AND ANAEROBIC 10CC  Final   Culture NO GROWTH 5 DAYS  Final   Report Status 06/25/2015 FINAL  Final  Culture, Urine     Status: None   Collection Time: 06/20/15  9:45 PM  Result Value Ref Range Status    Specimen Description URINE, CATHETERIZED  Final   Special Requests NONE  Final   Culture MULTIPLE SPECIES PRESENT, SUGGEST RECOLLECTION  Final   Report Status 06/22/2015 FINAL  Final  Culture, respiratory (NON-Expectorated)     Status: None   Collection Time: 06/20/15  9:48 PM  Result Value Ref Range Status   Specimen Description TRACHEAL ASPIRATE  Final   Special Requests NONE  Final   Gram Stain   Final    MODERATE WBC PRESENT, PREDOMINANTLY PMN NO SQUAMOUS EPITHELIAL CELLS SEEN RARE GRAM POSITIVE COCCI IN PAIRS IN CLUSTERS RARE GRAM NEGATIVE RODS RARE GRAM POSITIVE RODS    Culture   Final    Non-Pathogenic Oropharyngeal-type Flora Isolated. Performed at [facility]    Report Status 06/23/2015 FINAL  Final  Culture, blood (routine x 2)     Status: None   Collection Time: 06/20/15  9:49 PM  Result Value Ref Range Status   Specimen Description BLOOD RIGHT HAND  Final   Special Requests BOTTLES DRAWN AEROBIC ONLY 5CC  Final   Culture NO GROWTH 5 DAYS  Final   Report Status 06/25/2015 FINAL  Final  Culture, bal-quantitative     Status: None   Collection Time: 06/21/15 12:48 PM  Result Value Ref Range Status   Specimen Description BRONCHIAL ALVEOLAR LAVAGE  Final   Special Requests NONE  Final   Gram Stain   Final    ABUNDANT WBC PRESENT,BOTH PMN AND MONONUCLEAR NO SQUAMOUS EPITHELIAL CELLS SEEN FEW GRAM VARIABLE ROD RARE GRAM POSITIVE COCCI IN PAIRS    Colony Count   Final    >=100,000 COLONIES/ML Performed at [facility]    Culture   Final    Non-Pathogenic Oropharyngeal-type Flora Isolated. Performed at [facility]    Report Status 06/24/2015 FINAL  Final     Studies:  Recent x-ray studies have been reviewed in detail by the Attending Physician  Scheduled Meds:  Scheduled Meds: . alteplase  2 mg Intracatheter Once  . cefTRIAXone (ROCEPHIN)  IV  [name] g Intravenous Q24H  . chlorhexidine  15 mL Mouth Rinse BID  . furosemide  40 mg  Intravenous BID  . heparin subcutaneous  5,000 Units Subcutaneous 3 times per day  . insulin aspart  0-20 Units Subcutaneous 6 times per day  . insulin glargine  45 Units Subcutaneous QHS  . loratadine  10 mg Oral Daily  . methadone  2.5 mg Oral Q12H  . pantoprazole sodium  40 mg Per Tube Q24H  . polyethylene glycol  17 g Oral Daily  . risperiDONE  [name] mg Oral BID    Time spent on care of this patient: 40 mins   Ura Hausen, Roselind Messier , MD  [facility] Office  [phone] Pager [phone]  On-Call/Text Page:      [email or web address]      password [email or web address]  If 7PM-7AM, please contact night-coverage [email or web address] Password [email or web address] 06/29/2015, 8:34 AM   LOS: 17 days  Care during the described time interval was provided by me .  I have reviewed this patient's available data, including medical history, events of note, physical examination, and all test results as part of my evaluation. I have personally reviewed and interpreted all radiology studies.   Dia Crawford, MD [phone] Pager

## 2015-06-29 NOTE — Progress Notes (Signed)
Physical Therapy Treatment Patient Details Name: Derrick [name] MRN: [id number] DOB: 10-15-1980 Today's Date: 06/29/2015    History of Present Illness Patient is a 34 y/o male presents with abdominal pain and found to have hypercarbic respiratory failure and AMS. Required intubation 11/13. Pt became ventilator dependent with worsening pulmonary edema s/p trach 11/22. PMH includes Morbid Obesity, DM2, HTN, OSA     PT Comments    Pt admitted with above diagnosis. Pt currently with functional limitations due to continued strength, balance and endurance deficits. Pt able to ambulate a short distance, rest and ambulate even a little more.  Progressing daily. REady for Rehab.   Pt will benefit from skilled PT to increase their independence and safety with mobility to allow discharge to the venue listed below.    Follow Up Recommendations  CIR     Equipment Recommendations  Other (comment) (TBA)    Recommendations for Other Services       Precautions / Restrictions Precautions Precautions: Fall Precaution Comments: trach Restrictions Weight Bearing Restrictions: No    Mobility  Bed Mobility Overal bed mobility: Needs Assistance Bed Mobility: Supine to Sit     Supine to sit: Max assist;+2 for physical assistance;+2 for safety/equipment;HOB elevated;Mod assist     General bed mobility comments: Cues to bring BLEs to EOB and assist to elevate trunk. Cues for technique.Incr time.   Transfers Overall transfer level: Needs assistance Equipment used: Rolling walker (2 wheeled) Transfers: Sit to/from Stand Sit to Stand: Min assist;+2 physical assistance;+2 safety/equipment;From elevated surface         General transfer comment: Pt needed cues for proper foot/hand placement to RW.    Ambulation/Gait Ambulation/Gait assistance: Min assist;+2 physical assistance;Mod assist;+2 safety/equipment Ambulation Distance (Feet): 20 Feet (15 feet and then 5 feet) Assistive device:  Rolling walker (2 wheeled) Gait Pattern/deviations: Step-through pattern;Decreased stride length;Wide base of support;Drifts right/left   Gait velocity interpretation: Below normal speed for age/gender General Gait Details: Pt able toambulate with standing rest breaks occasionally.  Pt needed cues to stay close to RW and for upright stance as pt flexes when gets fatigued.  Followed pt with chair as he has knee instability.  Pt rested 5 min in chair and walked another 5 feet.  Needs assist to control descent into chair.    Stairs            Wheelchair Mobility    Modified Rankin (Stroke Patients Only)       Balance Overall balance assessment: Needs assistance Sitting-balance support: No upper extremity supported;Feet supported Sitting balance-Leahy Scale: Fair Sitting balance - Comments: Requires min guard assist for sitting EOB. Able to sit EOB unsupported for a few minutes.    Standing balance support: Bilateral upper extremity supported;During functional activity Standing balance-Leahy Scale: Poor Standing balance comment: Reliant on RW with min to mod assist for balance.                     Cognition Arousal/Alertness: Awake/alert Behavior During Therapy: WFL for tasks assessed/performed Overall Cognitive Status: Difficult to assess                      Exercises General Exercises - Lower Extremity Ankle Circles/Pumps: AROM;Both;10 reps;Supine Quad Sets: AROM;Both;10 reps;Supine Long Arc Quad: AROM;Both;5 reps;Seated Heel Slides: AROM;Both;5 reps;Supine    General Comments General comments (skin integrity, edema, etc.): Pt had breakfast in room.  ST came in and put valve on so pt could eat breakfast upon  PT finishing session.  Assisted pt with gettting his items opened and set up for meal.        Pertinent Vitals/Pain Pain Assessment: No/denies pain  VSS    Home Living                      Prior Function            PT Goals  (current goals can now be found in the care plan section) Progress towards PT goals: Progressing toward goals    Frequency  Min 3X/week    PT Plan Current plan remains appropriate    Co-evaluation             End of Session Equipment Utilized During Treatment: Gait belt;Oxygen (trach collar at 40%) Activity Tolerance: Patient tolerated treatment well;Patient limited by fatigue Patient left: in chair;with call bell/phone within reach     Time: 0847-0925 PT Time Calculation (min) (ACUTE ONLY): 38 min  Charges:  $Gait Training: 23-37 mins $Self Care/Home Management: 8-22                    G Codes:      Tyese Finken F 06/29/2015, 10:05 AM Eber Jonesawn Denese [place]ink,PT Acute Rehabilitation [phone] (pager)

## 2015-06-29 NOTE — Progress Notes (Signed)
Pt remains on 40% ATC, and has tolerated well throughout the night. Pt's SpO2 is fluctuating from 91%-93% at this time. Pt is sleeping and in no obvious respiratory distress, so will hold off on suctioning at this time unless notified. RT will continue to monitor.

## 2015-06-30 ENCOUNTER — Inpatient Hospital Stay
Admission: AD | Admit: 2015-06-30 | Discharge: 2015-07-22 | Disposition: A | Discharge status: Home Health | Payer: Self-pay | Source: Ambulatory Visit | Attending: Internal Medicine | Admitting: Internal Medicine

## 2015-06-30 DIAGNOSIS — J9690 Respiratory failure, unspecified, unspecified whether with hypoxia or hypercapnia: Secondary | ICD-10-CM

## 2015-06-30 DIAGNOSIS — K5900 Constipation, unspecified: Secondary | ICD-10-CM

## 2015-06-30 LAB — CBC WITH DIFFERENTIAL/PLATELET
BASOS ABS: 0 10*3/uL (ref 0.0–0.1)
Basophils Relative: 0 %
Eosinophils Absolute: 0.3 10*3/uL (ref 0.0–0.7)
Eosinophils Relative: 3 %
HCT: 37.4 % — ABNORMAL LOW (ref 39.0–52.0)
HEMOGLOBIN: 11.2 g/dL — AB (ref 13.0–17.0)
LYMPHS ABS: 2.4 10*3/uL (ref 0.7–4.0)
LYMPHS PCT: 27 %
MCH: 27.5 pg (ref 26.0–34.0)
MCHC: 29.9 g/dL — ABNORMAL LOW (ref 30.0–36.0)
MCV: 91.7 fL (ref 78.0–100.0)
Monocytes Absolute: 0.4 10*3/uL (ref 0.1–1.0)
Monocytes Relative: 4 %
NEUTROS ABS: 5.8 10*3/uL (ref 1.7–7.7)
NEUTROS PCT: 66 %
PLATELETS: 309 10*3/uL (ref 150–400)
RBC: 4.08 MIL/uL — AB (ref 4.22–5.81)
RDW: 13.2 % (ref 11.5–15.5)
WBC: 8.8 10*3/uL (ref 4.0–10.5)

## 2015-06-30 LAB — GLUCOSE, CAPILLARY
GLUCOSE-CAPILLARY: 155 mg/dL — AB (ref 65–99)
GLUCOSE-CAPILLARY: 156 mg/dL — AB (ref 65–99)
Glucose-Capillary: 130 mg/dL — ABNORMAL HIGH (ref 65–99)
Glucose-Capillary: 144 mg/dL — ABNORMAL HIGH (ref 65–99)

## 2015-06-30 LAB — COMPREHENSIVE METABOLIC PANEL
ALT: 45 U/L (ref 17–63)
ANION GAP: 5 (ref 5–15)
AST: 36 U/L (ref 15–41)
Albumin: 2.7 g/dL — ABNORMAL LOW (ref 3.5–5.0)
Alkaline Phosphatase: 89 U/L (ref 38–126)
BUN: 24 mg/dL — AB (ref 6–20)
CHLORIDE: 95 mmol/L — AB (ref 101–111)
CO2: 41 mmol/L — ABNORMAL HIGH (ref 22–32)
Calcium: 9.2 mg/dL (ref 8.9–10.3)
Creatinine, Ser: 0.98 mg/dL (ref 0.61–1.24)
Glucose, Bld: 134 mg/dL — ABNORMAL HIGH (ref 65–99)
POTASSIUM: 3.8 mmol/L (ref 3.5–5.1)
Sodium: 141 mmol/L (ref 135–145)
Total Bilirubin: 0.6 mg/dL (ref 0.3–1.2)
Total Protein: 7.5 g/dL (ref 6.5–8.1)

## 2015-06-30 LAB — MAGNESIUM: MAGNESIUM: 2 mg/dL (ref 1.7–2.4)

## 2015-06-30 MED ORDER — PANTOPRAZOLE SODIUM 40 MG PO PACK: 40.0000 mg | PACK | ORAL | Status: DC

## 2015-06-30 MED ORDER — INSULIN ASPART 100 UNIT/ML ~~LOC~~ SOLN: SUBCUTANEOUS | Status: DC

## 2015-06-30 MED ORDER — BISACODYL 10 MG RE SUPP: 10.0000 mg | Freq: Every day | RECTAL | Status: DC | PRN

## 2015-06-30 MED ORDER — POLYETHYLENE GLYCOL 3350 17 G PO PACK: 17.0000 g | PACK | Freq: Every day | ORAL | Status: DC

## 2015-06-30 MED ORDER — SENNOSIDES 8.8 MG/5ML PO SYRP: 5.0000 mL | ORAL_SOLUTION | Freq: Two times a day (BID) | ORAL | Status: DC | PRN

## 2015-06-30 MED ORDER — FUROSEMIDE 40 MG PO TABS: 60.0000 mg | ORAL_TABLET | Freq: Three times a day (TID) | ORAL | Status: DC

## 2015-06-30 MED ORDER — INSULIN GLARGINE 100 UNIT/ML ~~LOC~~ SOLN: 20.0000 [IU] | Freq: Every day | SUBCUTANEOUS | Status: DC

## 2015-06-30 MED ORDER — INFLUENZA VAC SPLIT QUAD 0.5 ML IM SUSY
0.5000 mL | PREFILLED_SYRINGE | Freq: Once | INTRAMUSCULAR | Status: AC
  Administered 2015-06-30: 0.5 mL via INTRAMUSCULAR

## 2015-06-30 MED ORDER — FUROSEMIDE 40 MG PO TABS: 40.0000 mg | ORAL_TABLET | Freq: Two times a day (BID) | ORAL | Status: DC

## 2015-06-30 MED ORDER — CHLORHEXIDINE GLUCONATE 0.12 % MT SOLN: 15.0000 mL | Freq: Two times a day (BID) | OROMUCOSAL | Status: DC

## 2015-06-30 MED ORDER — METHADONE HCL 5 MG PO TABS: 2.5000 mg | ORAL_TABLET | Freq: Every day | ORAL | Status: DC

## 2015-06-30 MED ORDER — RISPERIDONE 1 MG/ML PO SOLN: 0.5000 mg | Freq: Every day | ORAL | Status: DC

## 2015-06-30 MED ORDER — INSULIN ASPART 100 UNIT/ML ~~LOC~~ SOLN
0.0000 [IU] | Freq: Three times a day (TID) | SUBCUTANEOUS | Status: DC
  Administered 2015-06-30: 3 [IU] via SUBCUTANEOUS
  Administered 2015-06-30: 4 [IU] via SUBCUTANEOUS

## 2015-06-30 NOTE — Progress Notes (Signed)
Assisted Dr Suszanne Connerseoh in changing pt # 7 XLT to #6XLT trach at bedside. No Complications./ Pt stable. Per Dr Suszanne Connerseoh, if pt remains off vent, RT may change to cuffless trach next week. Positive color change on etco2, bilateral BS. RT will continue to monitor.

## 2015-06-30 NOTE — Discharge Summary (Signed)
Physician Discharge Summary  Derrick [name] MRN: [id number] DOB/AGE: 02-21-1981 34 y.o.  PCP: [facility]   Admit date: 06/12/2015 Discharge date: 06/30/2015  Discharge Diagnoses:     Active Problems:   CO2 retention   Hypoxemia   Morbid obesity (HCC)   Generalized abdominal pain   Diabetes mellitus without complication (HCC)   Essential hypertension   Constipation   Abdominal pain   RUQ abdominal pain   Acute respiratory failure with hypoxia and hypercarbia (HCC)   OSA (obstructive sleep apnea)   Obesity hypoventilation syndrome (HCC)   Acute on chronic diastolic heart failure (HCC)   Acute respiratory failure (HCC)   Collapse, lung   Acute encephalopathy   Dysphagia   Acute on chronic diastolic CHF (congestive heart failure), NYHA class 1 ([place])   Pulmonary hypertension ([place])   HCAP (healthcare-associated pneumonia)    Follow-up recommendations Follow-up with PCP in 3-5 days , including all  additional recommended appointments as below Follow-up CBC, CMP in 3-5 days Trach changed to a cuffed #6 XLT by Leta Baptist, MD. If continues to do well off vent, may change to a cuffles trach in 1 week. Speech therapy recommendations Regular;Thin liquid Liquids provided via: Straw;Cup Medication Administration: Whole meds with puree Supervision: Patient able to self feed;Intermittent supervision to cue for compensatory strategies Compensations: Slow rate;Small sips/bites;Follow solids with liquid Postural Changes and/or Swallow Maneuvers: Seated upright 90 degrees        Patient may use Passy-Muir Speech Valve: with SLP only PMSV Supervision: Full           Medication List    STOP taking these medications        lisinopril-hydrochlorothiazide 20-25 MG tablet  Commonly known as:  PRINZIDE,ZESTORETIC      TAKE these medications        albuterol 108 (90 BASE) MCG/ACT inhaler  Commonly known as:  PROVENTIL HFA;VENTOLIN HFA   Inhale 2 puffs into the lungs every 2 (two) hours as needed for wheezing or shortness of breath (cough).     allopurinol 300 MG tablet  Commonly known as:  ZYLOPRIM  Take 300 mg by mouth daily.     aspirin 81 MG tablet  Take 81 mg by mouth daily.     bisacodyl 10 MG suppository  Commonly known as:  DULCOLAX  Place 1 suppository (10 mg total) rectally daily as needed for moderate constipation.     chlorhexidine 0.12 % solution  Commonly known as:  PERIDEX  15 mLs by Mouth Rinse route 2 (two) times daily.     Furosemide 60 MG tablet  Commonly known as:  LASIX  Take 1 tablet (60 mg total) by mouth 3 times daily.     glimepiride 4 MG tablet  Commonly known as:  AMARYL  Take 4 mg by mouth daily with breakfast.     insulin aspart 100 UNIT/ML injection  Commonly known as:  novoLOG  QuestionAnswerComment Correction coverage:Resistant (obese, steroids) CBG < 70:implement hypoglycemia protocol CBG 70 - 120:0 units CBG 121 - 150:3 units CBG 151 - 200:4 units CBG 201 - 250:7 units CBG 251 - 300:11 units CBG 301 - 350:15 units CBG 351 - 400:20 units CBG > 400call MD and obtain STAT lab verification     insulin glargine 100 UNIT/ML injection  Commonly known as:  LANTUS  Inject  45 Units total) into the skin at bedtime.     loratadine 10 MG tablet  Commonly known as:  CLARITIN  Take 10 mg by mouth daily.     methadone 5 MG tablet  Commonly known as:  DOLOPHINE  Take 0.5 tablets (2.5 mg total) by mouth daily.     pantoprazole sodium 40 mg/20 mL Pack  Commonly known as:  PROTONIX  Place 20 mLs (40 mg total) into feeding tube daily.     polyethylene glycol packet  Commonly known as:  MIRALAX / GLYCOLAX  Take 17 g by mouth daily.     risperiDONE 1 MG/ML oral solution  Commonly known as:  RISPERDAL  Take 0.5 mLs (0.5 mg total) by mouth at bedtime.     sennosides 8.8 MG/5ML syrup  Commonly known as:  SENOKOT  Place 5 mLs into feeding tube 2 (two) times daily as  needed for mild constipation.         Discharge Condition: *Guarded   Discharge Instructions       Discharge Instructions    Diet - low sodium heart healthy    Complete by:  As directed      Increase activity slowly    Complete by:  As directed            Allergies  Allergen Reactions  . Vancomycin Other (See Comments)  . Zosyn [Piperacillin Sod-Tazobactam So] Other (See Comments)      Disposition: 01-Home or Self Care   Consults:  Critical care ENT     Significant Diagnostic Studies:  Dg Abd 1 View  06/13/2015  CLINICAL DATA:  Status post NG tube placement today. EXAM: ABDOMEN - 1 VIEW COMPARISON:  None. FINDINGS: NG tube is in place with the tip in the distal stomach in good position. IMPRESSION: As above. Electronically Signed   By: Inge Rise M.D.   On: 06/13/2015 15:10   US Abdomen Complete  06/13/2015  CLINICAL DATA:  Right upper quadrant pain EXAM: ULTRASOUND ABDOMEN COMPLETE COMPARISON:  None. FINDINGS: Gallbladder: No gallstones or wall thickening visualized. No sonographic Murphy sign noted. Common bile duct: Diameter: 5 mm Liver: Increased in echogenicity consistent with fatty infiltration. No focal mass lesion is noted. IVC: No abnormality visualized. Pancreas: Visualized portion unremarkable. Spleen: Size and appearance within normal limits. Right Kidney: Length: 13.2 cm. Echogenicity within normal limits. No mass or hydronephrosis visualized. Left Kidney: Length: 13.3 cm. Echogenicity within normal limits. No mass or hydronephrosis visualized. Abdominal aorta: No aneurysm visualized. Other findings: None. IMPRESSION: Fatty liver.  No acute abnormality is noted. Electronically Signed   By: Inez Catalina M.D.   On: 06/13/2015 18:13   Dg Chest Port 1 View  06/29/2015  CLINICAL DATA:  Pneumonia. EXAM: PORTABLE CHEST 1 VIEW COMPARISON:  06/24/2015. FINDINGS: Tracheostomy tube in good anatomic position. Cardiomegaly with normal pulmonary vascularity.  Low lung volumes with progressive bibasilar atelectasis and/or infiltrate. Small pleural effusions cannot be excluded. No pneumothorax. IMPRESSION: 1. Tracheostomy tube in stable position. 2. Stable severe cardiomegaly.  No pulmonary venous congestion. 3. Low lung volumes with progressive bibasilar atelectasis and/or infiltrate. Small bilateral pleural effusions cannot be excluded. Electronically Signed   By: [place]   On: 06/29/2015 07:27   Dg Chest Port 1 View  06/24/2015  CLINICAL DATA:  Lung collapse. EXAM: PORTABLE CHEST 1 VIEW COMPARISON:  Chest x-rays dated 06/23/2015, 06/22/2015 and 06/15/2015 FINDINGS: Tracheostomy tube remains well positioned with tip just above the level of the carina. Enteric tube passes below the diaphragm. Right IJ central line is stable in position with tip likely at the junction of the brachiocephalic vein and  SVC. Cardiomegaly is stable. Compared to the most recent studies, there is decreased interstitial edema suggesting improved fluid status. Vague opacity at the right lung base persists, likely atelectasis and/or small effusion. No new lung abnormality. No pneumothorax seen. IMPRESSION: Decreased interstitial edema suggesting improved fluid status. Cardiomegaly, stable. Persistent vague opacity at the right lung base, most likely atelectasis and/or small effusion as suggested on previous reports. Tubes and lines are stable in position. Electronically Signed   By: Franki Cabot M.D.   On: 06/24/2015 07:25   Dg Chest Port 1 View  06/23/2015  CLINICAL DATA:  Ventilator dependent respiratory failure. History of hypertension. EXAM: PORTABLE CHEST 1 VIEW COMPARISON:  06/22/2015 FINDINGS: Opacity persists at the right lung base, likely a combination of atelectasis and a small pleural effusion. There is hazy bilateral perihilar airspace lung opacity likely due to the low lung volumes and semi-erect positioning leading to bronchovascular crowding. There may be a degree of  edema. Cardiac silhouette is moderately enlarged. Tracheostomy tube and right internal jugular central venous line are stable. No pneumothorax. IMPRESSION: 1. No significant change from the prior study. 2. Persistent opacity at the right lung base. This is likely atelectasis with a small effusion. It could reflect pneumonia. 3. Possible mild pulmonary edema. Electronically Signed   By: Lajean Manes M.D.   On: 06/23/2015 08:32   Dg Chest Port 1 View  06/22/2015  CLINICAL DATA:  Respiratory failure EXAM: PORTABLE CHEST 1 VIEW COMPARISON:  June 21, 2015 FINDINGS: Tracheostomy catheter tip is 2.4 cm above the carina. Central catheter tip is in the superior vena cava. Nasogastric tube is no longer appreciable. No pneumothorax. There is right base consolidation with right effusion. Mild left upper lobe atelectasis remains. No new opacity. Heart is prominent with pulmonary vascularity within normal limits. No adenopathy appreciable. IMPRESSION: Tube and catheter positions as described without apparent pneumothorax. Stable consolidation with effusion right base. Stable left upper lobe atelectasis. No new opacity. No change in cardiac silhouette. Electronically Signed   By: Lowella Grip III M.D.   On: 06/22/2015 07:08   Dg Chest Port 1 View  06/21/2015  CLINICAL DATA:  Bronchoscopy EXAM: PORTABLE CHEST 1 VIEW COMPARISON:  06/21/2015 FINDINGS: The heart remains markedly enlarged. Endotracheal and NG tubes are stable. Right jugular central venous catheter is stable. Low lung volumes with dense opacity at the right base is stable. There is no pneumothorax. Atelectasis at the left base is stable. IMPRESSION: No pneumothorax after bronchoscopy. Stable opacity at the right lung base and minimal atelectasis at the left base. Electronically Signed   By: Marybelle Killings M.D.   On: 06/21/2015 13:28   Dg Chest Port 1 View  06/21/2015  CLINICAL DATA:  Respiratory failure EXAM: PORTABLE CHEST 1 VIEW COMPARISON:   Yesterday FINDINGS: Endotracheal tube tip at the clavicular heads. Orogastric tube tip reaches the stomach. Right IJ central line, tip at the SVC level. Stable cardiopericardial enlargement with vascular pedicle widening. Persistent low lung volumes with streaky basilar opacities and small right pleural effusion. Pulmonary venous congestion accentuated by volumes. No pneumothorax. IMPRESSION: 1. Unchanged positioning of tubes and central line. 2. Low volumes with bibasilar atelectasis and small effusions. Electronically Signed   By: Monte Fantasia M.D.   On: 06/21/2015 06:57   Dg Chest Port 1 View  06/20/2015  CLINICAL DATA:  Patient with acute respiratory failure. EXAM: PORTABLE CHEST 1 VIEW COMPARISON:  Chest radiograph 06/19/2015. FINDINGS: ET tube terminates in the mid trachea. Right IJ central venous  catheter tip projects over the superior vena cava. Enteric tube courses inferior to the diaphragm. Multiple monitoring leads overlie the patient. Stable cardiomegaly. Low lung volumes. Unchanged heterogeneous opacities left mid lower lung, favored to represent atelectasis. Small bilateral pleural effusions. IMPRESSION: Stable support apparatus. Cardiomegaly. Small bilateral pleural effusions, low lung volumes and basilar atelectasis. Electronically Signed   By: Lovey Newcomer M.D.   On: 06/20/2015 09:28   Dg Chest Port 1 View  06/19/2015  CLINICAL DATA:  Respiratory failure EXAM: PORTABLE CHEST 1 VIEW COMPARISON:  06/18/2015 FINDINGS: There is hazy lung base opacity similar to the prior study consistent with a combination of atelectasis and small effusions. No pulmonary edema. No pneumothorax. Cardiac silhouette is mildly enlarged. Endotracheal tube, right internal jugular central venous line and orogastric tube are stable in well positioned. No change from the previous day's study. IMPRESSION: 1. No change from the previous day's exam. 2. Persistent mild lung base atelectasis and small effusions. No  pulmonary edema. 3. Support apparatus is well positioned. Electronically Signed   By: Lajean Manes M.D.   On: 06/19/2015 07:03   Dg Chest Port 1 View  06/18/2015  CLINICAL DATA:  Acute respiratory failure, CHF, diabetes, morbid obesity EXAM: PORTABLE CHEST 1 VIEW COMPARISON:  Portable chest x-ray of June 17, 2015 FINDINGS: The lungs are mildly hypoinflated. Persistent bibasal with basilar interstitial and alveolar opacities. No definite pleural effusion. The cardiac silhouette remains enlarged. The pulmonary vascularity remains engorged but is more distinct today. The endotracheal tube tip lies approximately 1.7 cm above the carina. The esophagogastric tube tip projects below the inferior margin of the image. The right internal jugular venous catheter tip projects over the proximal SVC. IMPRESSION: 1. The endotracheal tube tip has migrated further distally and now is 1.6 cm above the carina. Withdrawal by 2-3 cm is recommended. 2. Slight improvement in pulmonary vascular congestion. Persistent bibasilar atelectasis or pneumonia. No definite pleural effusion. Electronically Signed   By: David  Martinique M.D.   On: 06/18/2015 07:17   Dg Chest Port 1 View  06/17/2015  CLINICAL DATA:  Acute respiratory failure, shortness of breath. EXAM: PORTABLE CHEST 1 VIEW COMPARISON:  Portable chest x-ray of June 16, 2015 FINDINGS: The lungs remain mildly hypoinflated. The interstitial and alveolar opacities persist bilaterally. The cardiac silhouette remains enlarged. The pulmonary vascularity engorged and less distinct today. No significant pleural effusion is observed. There is no pneumothorax. The endotracheal tube tip lies 2.5 cm above the carina. The esophagogastric tube tip projects below the inferior margin of the image. The right internal jugular venous catheter tip projects over the proximal SVC. IMPRESSION: Mild interval deterioration in the appearance of the pulmonary interstitium and pulmonary vascularity  consistent with worsening of pulmonary edema. Left lower lobe atelectasis or infiltrate persists. No significant pleural effusion is observed. The support tubes are in reasonable position. Electronically Signed   By: David  Martin[place] M.D.   On: 06/17/2015 07:12   Dg Chest Port 1 View  06/16/2015  CLINICAL DATA:  Acute respiratory failure, diabetes, hypertension, obesity. EXAM: PORTABLE CHEST 1 VIEW COMPARISON:  Portable chest x-ray of June 15, 2015 FINDINGS: The lungs remain mildly hypoinflated. The left hemidiaphragm remains partially obscured. The cardiac silhouette remains enlarged. The pulmonary vascularity is engorged but more distinct today. Pulmonary interstitial edema has decreased. The endotracheal tube tip lies 2.2 cm above the carina. The esophagogastric tube tip and proximal port lie below the GE junction. The right internal jugular venous catheter tip projects over the proximal  SVC. IMPRESSION: Mild interval improvement in the pulmonary interstitium consistent with decreased edema. Persistent left lower lobe atelectasis and probable small left pleural effusion. Stable cardiomegaly. The support tubes are stable in position. Electronically Signed   By: David  Martin[place] M.D.   On: 06/16/2015 07:36   Dg Chest Port 1 View  06/15/2015  CLINICAL DATA:  34 year old with ventilator dependent respiratory failure, acute on chronic diastolic heart failure, with placement of a new bedside right internal jugular catheter. EXAM: PORTABLE CHEST 1 VIEW 1503 hr: COMPARISON:  Portable chest x-ray earlier same day 0316 hr and previously. FINDINGS: New right jugular dual-lumen central venous catheter tip projects over the mid SVC. No evidence of pneumothorax or mediastinal hematoma. Endotracheal tube tip low, approximately 2 cm above the carina. Nasogastric tube courses below the diaphragm into the stomach though its tip is not included. Stable marked cardiomegaly. Pulmonary venous hypertension which has improved  since earlier in the day. Focal airspace consolidation in the right upper lobe, right lower lobe, inferior left upper lobe and dense airspace consolidation in the left lower lobe, progressive since earlier in the day. IMPRESSION: 1. New right jugular central venous catheter tip projects over the mid SVC. No acute complicating features. 2. Endotracheal tube tip somewhat low, approximately 2 cm above carina. 3. Mild pulmonary venous hypertension, improved since earlier in the day. 4. Worsening atelectasis and/or pneumonia throughout both lungs since earlier in the day. Electronically Signed   By: Evangeline Dakin M.D.   On: 06/15/2015 15:21   Dg Chest Port 1 View  06/15/2015  CLINICAL DATA:  Hypoxia, hypercapnia, respiratory failure with intubation, morbid obesity. EXAM: PORTABLE CHEST 1 VIEW COMPARISON:  Portable chest x-ray of June 14, 2015 FINDINGS: The lungs are mildly hypoinflated. The interstitial markings remain increased. The left hemidiaphragm is less well demonstrated today. The cardiac silhouette remains enlarged. The pulmonary vascularity is engorged and less distinct today. The endotracheal tube tip projects of approximately 1.5 cm above the carina. The esophagogastric tube tip projects below the inferior margin of the image. The subclavian catheter is again seen to extend into the upper portion of the IVC and loop upon itself and is not extend into the distal right atrium near the tricuspid valve. IMPRESSION: Slight interval deterioration in the pulmonary interstitium with increased pulmonary vascular prominence. Developing left lower lobe atelectasis is suspected. The endotracheal tube tip lies 1.5 cm above the carina. See the note above regarding positioning of the Swan-Ganz catheter. Electronically Signed   By: David  [place] M.D.   On: 06/15/2015 07:31   Dg Chest Port 1 View  06/14/2015  CLINICAL DATA:  Endotracheal tube position EXAM: PORTABLE CHEST 1 VIEW COMPARISON:  06/13/2015  FINDINGS: Right jugular endotracheal tube extends into the inferior vena cava then loops back into the right atrium. The tip is difficult to see due to underpenetration but is probably within the right atrium, similar to the prior study. No pneumothorax. Endotracheal tube in good position.  NG tube enters the stomach Hypoventilation with vascular congestion and bibasilar atelectasis. IMPRESSION: Swan-Ganz catheter tip extends into the inferior vena cava and then loops back into the right atrium similar to yesterday. Hypoventilation with bibasilar atelectasis Endotracheal tube in satisfactory position. Electronically Signed   By: Franchot Gallo M.D.   On: 06/14/2015 07:13   Dg Chest Port 1 View  06/13/2015  CLINICAL DATA:  Central line placement. EXAM: PORTABLE CHEST 1 VIEW COMPARISON:  Chest x-ray dated 06/12/2015. FINDINGS: Endotracheal tube has been placed with tip  well positioned just above the level of the carina. Right internal jugular Swan-Ganz catheter appears adequately positioned with tip just to the left of midline. Swan-Ganz catheter confirmed with patient's nurse Rip Harbour. Enteric tube passes below diaphragm. Cardiomegaly appears stable.  Lungs are clear.  No pneumothorax. IMPRESSION: Right internal jugular Swan-Ganz catheter appears adequately positioned with tip just to the left of midline. No pneumothorax or other procedural complicating feature. Stable cardiomegaly. Endotracheal tube well positioned with tip just above the level of the carina. Electronically Signed   By: Franki Cabot M.D.   On: 06/13/2015 15:19   Dg Chest Port 1 View  06/12/2015  CLINICAL DATA:  Patient with hypoxemia. Also complaining of abdominal pain and nausea since yesterday. EXAM: PORTABLE CHEST 1 VIEW COMPARISON:  06/19/2014 FINDINGS: Mild to moderate enlargement of the cardiopericardial silhouette, stable. No mediastinal or hilar masses or evidence of adenopathy. Lungs are clear. No convincing pulmonary edema. No  pleural effusion or pneumothorax. Bony thorax is unremarkable. IMPRESSION: No acute cardiopulmonary disease. Electronically Signed   By: Lajean Manes M.D.   On: 06/12/2015 16:06   Dg Abd Portable 1v  06/23/2015  CLINICAL DATA:  Encounter for feeding tube placement. EXAM: PORTABLE ABDOMEN - 1 VIEW COMPARISON:  06/22/2015 FINDINGS: Enteric tube tip appears to be in the second portion of the duodenum, retracted from the previous day's study. IMPRESSION: Enteric feeding tube tip now projects in the second portion of the duodenum. Electronically Signed   By: Lajean Manes M.D.   On: 06/23/2015 11:31   Dg Abd Portable 1v  06/22/2015  CLINICAL DATA:  Feeding tube placement EXAM: PORTABLE ABDOMEN - 1 VIEW COMPARISON:  June 13, 2015 FINDINGS: Feeding tube tip is at the level of the fourth portion of the duodenum. The bowel gas pattern is within normal limits. IMPRESSION: Feeding tube tip at level of fourth portion of duodenum. Bowel gas pattern within normal limits. Electronically Signed   By: Lowella Grip III M.D.   On: 06/22/2015 12:30   Dg Abd Portable 1v  06/13/2015  CLINICAL DATA:  34 year old male with abdominal pain. EXAM: PORTABLE ABDOMEN - 1 VIEW COMPARISON:  None. FINDINGS: The bowel gas pattern is normal. No radio-opaque calculi or other significant radiographic abnormality are seen. IMPRESSION: Negative. Electronically Signed   By: Margarette Canada M.D.   On: 06/13/2015 09:33    2-D echo LV EF: 55% -  60%  ------------------------------------------------------------------- Indications:   CHF - 428.0.  ------------------------------------------------------------------- History:  Risk factors: Hypertension. Diabetes mellitus. Morbidly obese.  ------------------------------------------------------------------- Study Conclusions  - Left ventricle: The cavity size was normal. There was moderate concentric hypertrophy. Systolic function was normal. The estimated ejection  fraction was in the range of 55% to 60%. Wall motion was normal; there were no regional wall motion abnormalities. Features are consistent with a pseudonormal left ventricular filling pattern, with concomitant abnormal relaxation and increased filling pressure (grade 2 diastolic dysfunction). - Pulmonic valve: Poorly visualized.   Filed Weights   06/28/15 0400 06/29/15 0500 06/30/15 0500  Weight: 215.005 kg (474 lb) 217.273 kg (479 lb) 204.572 kg (451 lb)     Microbiology: Recent Results (from the past 240 hour(s))  Culture, blood (routine x 2)     Status: None   Collection Time: 06/20/15  9:44 PM  Result Value Ref Range Status   Specimen Description BLOOD LEFT ANTECUBITAL  Final   Special Requests BOTTLES DRAWN AEROBIC AND ANAEROBIC 10CC  Final   Culture NO GROWTH 5 DAYS  Final  Report Status 06/25/2015 FINAL  Final  Culture, Urine     Status: None   Collection Time: 06/20/15  9:45 PM  Result Value Ref Range Status   Specimen Description URINE, CATHETERIZED  Final   Special Requests NONE  Final   Culture MULTIPLE SPECIES PRESENT, SUGGEST RECOLLECTION  Final   Report Status 06/22/2015 FINAL  Final  Culture, respiratory (NON-Expectorated)     Status: None   Collection Time: 06/20/15  9:48 PM  Result Value Ref Range Status   Specimen Description TRACHEAL ASPIRATE  Final   Special Requests NONE  Final   Gram Stain   Final    MODERATE WBC PRESENT, PREDOMINANTLY PMN NO SQUAMOUS EPITHELIAL CELLS SEEN RARE GRAM POSITIVE COCCI IN PAIRS IN CLUSTERS RARE GRAM NEGATIVE RODS RARE GRAM POSITIVE RODS    Culture   Final    Non-Pathogenic Oropharyngeal-type Flora Isolated. Performed at [facility]    Report Status 06/23/2015 FINAL  Final  Culture, blood (routine x 2)     Status: None   Collection Time: 06/20/15  9:49 PM  Result Value Ref Range Status   Specimen Description BLOOD RIGHT HAND  Final   Special Requests BOTTLES DRAWN AEROBIC ONLY 5CC  Final   Culture  NO GROWTH 5 DAYS  Final   Report Status 06/25/2015 FINAL  Final  Culture, bal-quantitative     Status: None   Collection Time: 06/21/15 12:48 PM  Result Value Ref Range Status   Specimen Description BRONCHIAL ALVEOLAR LAVAGE  Final   Special Requests NONE  Final   Gram Stain   Final    ABUNDANT WBC PRESENT,BOTH PMN AND MONONUCLEAR NO SQUAMOUS EPITHELIAL CELLS SEEN FEW GRAM VARIABLE ROD RARE GRAM POSITIVE COCCI IN PAIRS    Colony Count   Final    >=100,000 COLONIES/ML Performed at [facility]    Culture   Final    Non-Pathogenic Oropharyngeal-type Flora Isolated. Performed at [facility]    Report Status 06/24/2015 FINAL  Final       Blood Culture    Component Value Date/Time   SDES BRONCHIAL ALVEOLAR LAVAGE 06/21/2015 1248   SPECREQUEST NONE 06/21/2015 1248   CULT  06/21/2015 1248    Non-Pathogenic Oropharyngeal-type Flora Isolated. Performed at [facility] 06/24/2015 FINAL 06/21/2015 1248      Labs: Results for orders placed or performed during the hospital encounter of 06/12/15 (from the past 48 hour(s))  Glucose, capillary     Status: Abnormal   Collection Time: 06/28/15 12:21 PM  Result Value Ref Range   Glucose-Capillary 161 (H) 65 - 99 mg/dL  Glucose, capillary     Status: Abnormal   Collection Time: 06/28/15  4:54 PM  Result Value Ref Range   Glucose-Capillary 164 (H) 65 - 99 mg/dL  Glucose, capillary     Status: Abnormal   Collection Time: 06/28/15  8:27 PM  Result Value Ref Range   Glucose-Capillary 147 (H) 65 - 99 mg/dL  Glucose, capillary     Status: Abnormal   Collection Time: 06/28/15 11:52 PM  Result Value Ref Range   Glucose-Capillary 121 (H) 65 - 99 mg/dL  Comprehensive metabolic panel     Status: Abnormal   Collection Time: 06/29/15  3:30 AM  Result Value Ref Range   Sodium 142 135 - 145 mmol/L   Potassium 4.0 3.5 - 5.1 mmol/L   Chloride 95 (L) 101 - 111 mmol/L   CO2 40 (H) 22 - 32 mmol/L  Glucose, Bld 143 (H) 65 - 99 mg/dL   BUN 29 (H) 6 - 20 mg/dL   Creatinine, Ser 0.98 0.61 - 1.24 mg/dL   Calcium 9.2 8.9 - 10.3 mg/dL   Total Protein 7.0 6.5 - 8.1 g/dL   Albumin 2.6 (L) 3.5 - 5.0 g/dL   AST 42 (H) 15 - 41 U/L   ALT 42 17 - 63 U/L   Alkaline Phosphatase 86 38 - 126 U/L   Total Bilirubin 0.5 0.3 - 1.2 mg/dL   GFR calc non Af Amer >60 >60 mL/min   GFR calc Af Amer >60 >60 mL/min    Comment: (NOTE) The eGFR has been calculated using the CKD EPI equation. This calculation has not been validated in all clinical situations. eGFR's persistently <60 mL/min signify possible Chronic Kidney Disease.    Anion gap 7 5 - 15  CBC with Differential/Platelet     Status: Abnormal   Collection Time: 06/29/15  3:30 AM  Result Value Ref Range   WBC 9.3 4.0 - 10.5 K/uL   RBC 3.95 (L) 4.22 - 5.81 MIL/uL   Hemoglobin 10.9 (L) 13.0 - 17.0 g/dL   HCT 36.5 (L) 39.0 - 52.0 %   MCV 92.4 78.0 - 100.0 fL   MCH 27.6 26.0 - 34.0 pg   MCHC 29.9 (L) 30.0 - 36.0 g/dL   RDW 13.1 11.5 - 15.5 %   Platelets 285 150 - 400 K/uL   Neutrophils Relative % 65 %   Neutro Abs 5.9 1.7 - 7.7 K/uL   Lymphocytes Relative 28 %   Lymphs Abs 2.6 0.7 - 4.0 K/uL   Monocytes Relative 4 %   Monocytes Absolute 0.4 0.1 - 1.0 K/uL   Eosinophils Relative 3 %   Eosinophils Absolute 0.3 0.0 - 0.7 K/uL   Basophils Relative 0 %   Basophils Absolute 0.0 0.0 - 0.1 K/uL  Magnesium     Status: None   Collection Time: 06/29/15  3:30 AM  Result Value Ref Range   Magnesium 1.8 1.7 - 2.4 mg/dL  Glucose, capillary     Status: Abnormal   Collection Time: 06/29/15  4:13 AM  Result Value Ref Range   Glucose-Capillary 139 (H) 65 - 99 mg/dL  Glucose, capillary     Status: Abnormal   Collection Time: 06/29/15  8:19 AM  Result Value Ref Range   Glucose-Capillary 135 (H) 65 - 99 mg/dL  Glucose, capillary     Status: Abnormal   Collection Time: 06/29/15 12:07 PM  Result Value Ref Range   Glucose-Capillary 178 (H) 65 - 99 mg/dL   Glucose, capillary     Status: Abnormal   Collection Time: 06/29/15  5:50 PM  Result Value Ref Range   Glucose-Capillary 116 (H) 65 - 99 mg/dL  Glucose, capillary     Status: Abnormal   Collection Time: 06/29/15  8:04 PM  Result Value Ref Range   Glucose-Capillary 164 (H) 65 - 99 mg/dL  Glucose, capillary     Status: Abnormal   Collection Time: 06/30/15 12:24 AM  Result Value Ref Range   Glucose-Capillary 144 (H) 65 - 99 mg/dL  Comprehensive metabolic panel     Status: Abnormal   Collection Time: 06/30/15  4:05 AM  Result Value Ref Range   Sodium 141 135 - 145 mmol/L   Potassium 3.8 3.5 - 5.1 mmol/L   Chloride 95 (L) 101 - 111 mmol/L   CO2 41 (H) 22 - 32 mmol/L   Glucose, Bld 134 (  H) 65 - 99 mg/dL   BUN 24 (H) 6 - 20 mg/dL   Creatinine, Ser 0.98 0.61 - 1.24 mg/dL   Calcium 9.2 8.9 - 10.3 mg/dL   Total Protein 7.5 6.5 - 8.1 g/dL   Albumin 2.7 (L) 3.5 - 5.0 g/dL   AST 36 15 - 41 U/L   ALT 45 17 - 63 U/L   Alkaline Phosphatase 89 38 - 126 U/L   Total Bilirubin 0.6 0.3 - 1.2 mg/dL   GFR calc non Af Amer >60 >60 mL/min   GFR calc Af Amer >60 >60 mL/min    Comment: (NOTE) The eGFR has been calculated using the CKD EPI equation. This calculation has not been validated in all clinical situations. eGFR's persistently <60 mL/min signify possible Chronic Kidney Disease.    Anion gap 5 5 - 15  CBC with Differential/Platelet     Status: Abnormal   Collection Time: 06/30/15  4:05 AM  Result Value Ref Range   WBC 8.8 4.0 - 10.5 K/uL   RBC 4.08 (L) 4.22 - 5.81 MIL/uL   Hemoglobin 11.2 (L) 13.0 - 17.0 g/dL   HCT 37.4 (L) 39.0 - 52.0 %   MCV 91.7 78.0 - 100.0 fL   MCH 27.5 26.0 - 34.0 pg   MCHC 29.9 (L) 30.0 - 36.0 g/dL   RDW 13.2 11.5 - 15.5 %   Platelets 309 150 - 400 K/uL   Neutrophils Relative % 66 %   Neutro Abs 5.8 1.7 - 7.7 K/uL   Lymphocytes Relative 27 %   Lymphs Abs 2.4 0.7 - 4.0 K/uL   Monocytes Relative 4 %   Monocytes Absolute 0.4 0.1 - 1.0 K/uL   Eosinophils  Relative 3 %   Eosinophils Absolute 0.3 0.0 - 0.7 K/uL   Basophils Relative 0 %   Basophils Absolute 0.0 0.0 - 0.1 K/uL  Magnesium     Status: None   Collection Time: 06/30/15  4:05 AM  Result Value Ref Range   Magnesium 2.0 1.7 - 2.4 mg/dL  Glucose, capillary     Status: Abnormal   Collection Time: 06/30/15  5:04 AM  Result Value Ref Range   Glucose-Capillary 155 (H) 65 - 99 mg/dL  Glucose, capillary     Status: Abnormal   Collection Time: 06/30/15  8:18 AM  Result Value Ref Range   Glucose-Capillary 156 (H) 65 - 99 mg/dL     Lipid Panel     Component Value Date/Time   TRIG 161* 06/28/2015 0205     Lab Results  Component Value Date   HGBA1C 10.1* 06/13/2015     Lab Results  Component Value Date   CREATININE 0.98 06/30/2015     HPI :34 y.o. male with history of DM type 2, Morbid obesity, OSA, OSV who was admitted on 06/12/15 with hypercarbic respiratory failure and placed on BIPAP for support. BLE dopplers negative for DVT and unable to do chest CT due to body habitus. He with developed hypoxemia with MS changes requiring intubation on 11/13. He was noted to have pulmonary edema secondary to CHF due to morbid obesity and was treated with diuresis. 2 D echo done revealing moderate concentric hypertrophy with EF 26-71%, grade 2 diastolic dysfunction and no wall abnormality. He had difficulty with vent wean and underwent bronchoscopy for collapsed RLL with lavage and tracheostomy by Dr. Benjamine Mola on 11/22. He developed fevers due to HCAP and was started on IV antibiotics for coverage. Agitation due to delirium has resolved and  he has been tolerating TC X 24 hours with PMSV with ST. FEES done yesterday and he was started on regular diet. Trach to be changed out next week. Therapy ongoing and CIR recommended due to deconditioned state.    STUDIES:   CXR 11/13 >> with cardiomegally and pulmonary edema. Venous doppler 11/13 >> without DVT, superficial thrombosis, or Baker's  cyst CXR 11/14 >> Hypoventilation with bibasilar atelectasis.  TTE 11/14 >> Mod concentric LV hypertrophy, EF 76-80%, grade 2 diastolic dysfunction, can't assess PV.  CXR 11/15 >> Slight interval deterioration in the pulmonary interstitium with increased pulmonary vascular prominence. Developing left lower lobe atelectasis is suspected.  CXR 11/17 >> Worsening pulmonary edema.  CXR 11/18 >> Slight improvement in pulmonary vascular congestion. Persistent bibasilar atelectasis or pneumonia CXR 11/19 >> Persistent mild lung base atelectasis and small effusions without pulmonary edema. CXR 11/21 >> Low lung vol, bibasilar atelectasis and small effusions Bronchoscopy 11/22 >> Impressive thin grey secretions with complete collapse rt main and all lobes rt and partial 50% left main  SIGNIFICANT EVENTS: 11/13 intubation for hypercarbic/hypoxic respiratory failure. 11/14 transitioned from Lovenox gtt to heparin gtt 11/15 removed PA catheter and replaced with CVL, decrease Lasix gtt  11/17 transitioned to intermittent Lasix 11/20 T 102.5, pan culture and Levaquin  11/22 bronchoscopy, tracheostomy in OR by ENT 11/23 severe delirium  11/24 improved with new cocktail neuro, off drips   HOSPITAL COURSE:    Hypercarbic VDRF - due to morbid obesity, OSA, OHV and CHF. PE less likely given history COLLAPSE RLL s/p broncho with lavage on 11/22 - resolved 11/24 Concerns for pneumonia VAP OETT 11/13 >> 11/22, Trach 11/22 >> Patient started on ceftazidime 11/22, linazolid  11/22, Levaquin 11/20, switched to ceftriaxone on 11/24, discontinued 11/29,06/20/2015 Blood cultures remain negative - ATC successful to 24 hr, maintain off vent --ENT changed Trach to a cuffed #6 XLT today without difficulty. If continues to do well off vent, may change to a cuffles trach in 1 week. -xray continues to improve Continues to diurese with Lasix renal function stable -eating now, cuff down recommended -PMV  increase daytime use  Acute on chronic diastolic CHF/Pulmonary Hypertension -Strict in and out since admission + 4.2 L -Daily weight bed; 11/29= 217.2 kg -06/14/2015 echocardiogram. 88-11%, grade 2 diastolic dysfunction -Increase Lasix 60 mg TID Monitor renal function closely, BMP every 3-5 days  Hypernatremia - resolved  Sodium 141 prior to discharge  Probable aspiration - post pyloric feeding - dc - SLP following, pending swallow evaluation 11/28 - now patient is on carb modified diet with thin liquids - Protonix daily  Acute kidney injury -Serum creatinine peaked at 2.11--> 0.83--> 0.98 -Resolved  Diabetes mellitus type 2 -06/13/2015 Hemoglobin A1c =10.1 -Increase Lantus 45 units daily Cont  SSI  Acute encephalopathy -Multifactorial including ICU delirium, metabolic derangement, infection, hypercarbia, medications -Appears to have resolved as patient is following all commands and nodding yes and no to questions. -Decrease Methadone to 2.5 mg BID x 2 days then dc -Continue Risperdal 2 mg BID,keep but reduce further, liikley dc in 2 days  Dysphagia -Patient will require Fiberoptic endoscopic evaluation of swallowing(FEES ) will be performed Monday -If patient passes will remove NG tube  Hypernatremia -Resolved (upper limits of normal) - decrease To 100 ml free water QID via NG tube  Nutrition, aspiration  Per NG tube  DM Type 2 Uncontrolled.  -11/13 hemoglobin A1c = 10.1 - Continue resistant SSI  -Continue Lantus to 45 units QHS  Hypokalemia  -  Potassium goal > 4     Discharge Exam:  Blood pressure 139/83, pulse 95, temperature 99.3 F (37.4 C), temperature source Oral, resp. rate 16, height _0  (1.778 m), weight 204.572 kg (451 lb), SpO2 98 %.  General: Obese male in NAD in chair Neuro: AAOx4, nonfocal, calm Cardiovascular: s1 s2 RRR distant Lungs: reduced Abdomen: Soft, obese, ND and +BS. Musculoskeletal: No edema Skin: Intact, no new  rashes      Signed: Zamyia Gowell 06/30/2015, 11:54 AM        Time spent >45 mins

## 2015-06-30 NOTE — Progress Notes (Signed)
Speech Language Pathology Treatment: Dysphagia;Passy Muir Speaking valve  Patient Details Name: Derrick HansenReginald A [name] MRN: [id number] DOB: August 29, 1980 Today's Date: 06/30/2015 Time: [id number] SLP Time Calculation (min) (ACUTE ONLY): 14 min  Assessment / Plan / Recommendation Clinical Impression  Pt progressing very well with speaking valve and dysphagia. Vocal quality low at times with cues to inhale deeply for increased intensity. Educated/demonstrated for pt's mom donning and doffing valve only if she is with pt and he coughs it off (not for initial placement although educated her re: cuff deflated). Consumed regular solid cracker and thin liquids without s/s aspiration; independently recalled alternating solids and liquids strategy and SLP reminded of 2 swallows. Plans for LTAC transfer today.    HPI HPI: 34 year old male with morbid obesity, OSA and OHV who presents to PCCM with AMS. ABG was ordered and patient was noted to be hypercarbic and with respiratory acidosis. Subsequently the patient developed hypoxemia as well. Patient was transferred to the ICU and BiPAP was started. F/U ABG post BiPAP showed no evidence of improvement and mental status continued to deteriorate so decision was made to intubate. Pt was intubated 11/13-11/22, at which time trach was placed.      SLP Plan  Continue with current plan of care     Recommendations  Diet recommendations: Regular;Thin liquid Liquids provided via: Straw;Cup Medication Administration: Whole meds with puree Supervision: Patient able to self feed;Intermittent supervision to cue for compensatory strategies Compensations: Slow rate;Small sips/bites;Follow solids with liquid Postural Changes and/or Swallow Maneuvers: Seated upright 90 degrees      Patient may use Passy-Muir Speech Valve: with SLP only PMSV Supervision: Full MD: Please consider changing trach tube to : Cuffless       Oral Care Recommendations: Oral care QID Follow  up Recommendations:  (tbd) Plan: Continue with current plan of care   Royce MacadamiaLitaker, Elizabethann Lackey Willis 06/30/2015, 2:59 PM   Breck CoonsLisa Willis Ledonna Dormer M.Ed [employer]SLP Pager [phone]

## 2015-06-30 NOTE — Progress Notes (Signed)
PULMONARY / CRITICAL CARE MEDICINE   Name: Derrick HansenReginald A Sirek MRN: [id number] DOB: 10/23/1980    ADMISSION DATE:  06/12/2015 CONSULTATION DATE:  06/13/2015  REFERRING MD :  TRH  CHIEF COMPLAINT:  Hypercarbic respiratory failure and AMS  INITIAL PRESENTATION: 34 year old male with morbid obesity, OSA and OHV who presents to PCCM with AMS.  ABG was ordered and patient was noted to be hypercarbic and with respiratory acidosis.  Subsequently the patient developed hypoxemia as well.  Patient was transferred to the ICU and BiPAP was started.  F/U ABG post BiPAP showed no evidence of improvement and mental status continued to deteriorate so decision was made to intubate.  STUDIES:  CXR 11/13 >> with cardiomegally and pulmonary edema. Venous doppler 11/13 >> without DVT, superficial thrombosis, or Baker's cyst CXR 11/14 >> Hypoventilation with bibasilar atelectasis.  TTE 11/14 >> Mod concentric LV hypertrophy, EF 55-60%, grade 2 diastolic dysfunction, can't assess PV.  CXR 11/15 >> Slight interval deterioration in the pulmonary interstitium with increased pulmonary vascular prominence. Developing left lower lobe atelectasis is suspected.  CXR 11/17 >> Worsening pulmonary edema.  CXR 11/18 >> Slight improvement in pulmonary vascular congestion. Persistent bibasilar atelectasis or pneumonia CXR 11/19 >> Persistent mild lung base atelectasis and small effusions without pulmonary edema. CXR 11/21 >> Low lung vol, bibasilar atelectasis and small effusions Bronchoscopy 11/22 >> Impressive thin grey secretions with complete collapse rt main and all lobes rt and partial 50% left main  SIGNIFICANT EVENTS: 11/13  intubation for hypercarbic/hypoxic respiratory failure. 11/14  transitioned from Lovenox gtt to heparin gtt 11/15  removed PA catheter and replaced with CVL, decrease Lasix gtt  11/17  transitioned to intermittent Lasix 11/20  T 102.5, pan culture and Levaquin  11/22  bronchoscopy,  tracheostomy in OR by ENT 11/23  severe delirium  11/24  improved with new cocktail neuro, off drips  SUBJECTIVE:   Did 24 hr TC, trach was changed today   VITAL SIGNS: Temp:  [98.8 F (37.1 C)-99.4 F (37.4 C)] 99.3 F (37.4 C) (11/30 0400) Pulse Rate:  [93-108] 93 (11/30 1136) Resp:  [10-28] 18 (11/30 1136) BP: (135-155)/(79-102) 135/90 mmHg (11/30 1136) SpO2:  [90 %-98 %] 90 % (11/30 1136) FiO2 (%):  [40 %] 40 % (11/30 1136) Weight:  [204.572 kg (451 lb)] 204.572 kg (451 lb) (11/30 0500)   HEMODYNAMICS:     VENTILATOR SETTINGS: Vent Mode:  [-]  FiO2 (%):  [40 %] 40 %   INTAKE / OUTPUT:  Intake/Output Summary (Last 24 hours) at 06/30/15 1227 Last data filed at 06/30/15 0520  Gross per 24 hour  Intake    838 ml  Output    200 ml  Net    638 ml    PHYSICAL EXAMINATION: General:  No distress Neuro:  AAOx4, nonfocal, calm Cardiovascular:  s1 s2 RRR distant Lungs: reduced, no rhonci Abdomen:  Soft, obese, ND and +BS. Musculoskeletal:  No edema Skin:  Intact, no new rashes  LABS:  CBC  Recent Labs Lab 06/28/15 0205 06/29/15 0330 06/30/15 0405  WBC 9.0 9.3 8.8  HGB 11.1* 10.9* 11.2*  HCT 36.7* 36.5* 37.4*  PLT 252 285 309   Coag's No results for input(s): APTT, INR in the last 168 hours. BMET  Recent Labs Lab 06/28/15 0205 06/29/15 0330 06/30/15 0405  NA 145 142 141  K 3.9 4.0 3.8  CL 99* 95* 95*  CO2 39* 40* 41*  BUN 28* 29* 24*  CREATININE 0.83 0.98 0.98  GLUCOSE 217* 143* 134*   Electrolytes  Recent Labs Lab 06/28/15 0205 06/29/15 0330 06/30/15 0405  CALCIUM 9.2 9.2 9.2  MG 1.7 1.8 2.0   Sepsis Markers No results for input(s): LATICACIDVEN, PROCALCITON, O2SATVEN in the last 168 hours.   ABG No results for input(s): PHART, PCO2ART, PO2ART in the last 168 hours.   Liver Enzymes  Recent Labs Lab 06/28/15 0205 06/29/15 0330 06/30/15 0405  AST 33 42* 36  ALT 35 42 45  ALKPHOS 73 86 89  BILITOT 0.4 0.5 0.6  ALBUMIN 2.4*  2.6* 2.7*   Cardiac Enzymes No results for input(s): TROPONINI, PROBNP in the last 168 hours.   Glucose  Recent Labs Lab 06/29/15 1207 06/29/15 1750 06/29/15 2004 06/30/15 0024 06/30/15 0504 06/30/15 0818  GLUCAP 178* 116* 164* 144* 155* 156*    Imaging No results found.   ASSESSMENT / PLAN:  PULMONARY OETT 11/13 >> 11/22 Trach 11/22 >> A:  Hypercarbic VDRF - due to morbid obesity, OSA, OHV and CHF. PE less likely given history COLLAPSE RLL s/p broncho with lavage on 11/22 - resolved 11/24 Concerns for pneumonia VAP P:   - maintain off vent -but need abg at 5 am on tc to determine nocturnal vent needs!!!! -Would prefer cuffless trach , but we can assess abg first in am to finalize this decision -increase PMV use!!!  RENAL A:   Hypernatremia - resolved  P:   - d5w kvo - na intermittent labs  GASTROINTESTINAL A:   Nutrition, takes by mouth with trach P:    - can eat off vent - Protonix daily  NEUROLOGIC A:   AMS due to hypercarbia, severe agitation, delirium  - improved P:   RASS goal: 0 Methadone and Risperdal, titrate to off over 24 hrs   Mcarthur Rossetti. Tyson Alias, MD, FACP Pgr: [phone] [facility]

## 2015-06-30 NOTE — Care Management Note (Signed)
Case Management Note  Patient Details  Name: Derrick [name] MRN: [id number] Date of Birth: 1981-04-09  Subjective/Objective:      Pt and mother have discussed LTAC transfer with previous case manager, [facility], has now received insurance authorization and will transfer to [facility] today.                        Expected Discharge Plan:  Long Term Acute Care (LTAC)  Discharge planning Services  CM Consult  Status of Service:  Completed, signed off  Izola Teague, Chapman FitchHenrietta T, CaliforniaRN 06/30/2015, 2:25 PM

## 2015-06-30 NOTE — Progress Notes (Signed)
Subjective: Doing well on trach collar. Of vent for the past 48 hours.   Objective: Vital signs in last 24 hours: Temp:  [98.6 F (37 C)-99.4 F (37.4 C)] 99.3 F (37.4 C) (11/30 0400) Pulse Rate:  [93-108] 96 (11/30 0325) Resp:  [10-28] 10 (11/30 0400) BP: (123-155)/(79-102) 137/87 mmHg (11/30 0400) SpO2:  [90 %-96 %] 91 % (11/30 0400) FiO2 (%):  [40 %] 40 % (11/30 0325) Weight:  [204.572 kg (451 lb)] 204.572 kg (451 lb) (11/30 0500)  PHYSICAL EXAMINATION: General: Morbidly obese male. On trach collar. Neuro:Awake and responsive. Neck: Obese. Trach in place. No bleeding. Oral: Normal mucosa. Lungs: Good air movement over the anterior chest wall bilaterally.    Recent Labs  06/29/15 0330 06/30/15 0405  WBC 9.3 8.8  HGB 10.9* 11.2*  HCT 36.5* 37.4*  PLT 285 309    Recent Labs  06/29/15 0330 06/30/15 0405  NA 142 141  K 4.0 3.8  CL 95* 95*  CO2 40* 41*  GLUCOSE 143* 134*  BUN 29* 24*  CREATININE 0.98 0.98  CALCIUM 9.2 9.2    Medications:  I have reviewed the patient's current medications. Scheduled: . alteplase  2 mg Intracatheter Once  . chlorhexidine  15 mL Mouth Rinse BID  . furosemide  60 mg Intravenous TID  . heparin subcutaneous  5,000 Units Subcutaneous 3 times per day  . insulin aspart  0-20 Units Subcutaneous TID AC & HS  . insulin glargine  45 Units Subcutaneous QHS  . loratadine  10 mg Oral Daily  . methadone  2.5 mg Oral Daily  . pantoprazole sodium  40 mg Per Tube Q24H  . polyethylene glycol  17 g Oral Daily  . risperiDONE  0.5 mg Oral QHS   JYN:WGNFAOZHYPRN:bisacodyl, fentaNYL (SUBLIMAZE) injection, LORazepam, [DISCONTINUED] ondansetron **OR** ondansetron (ZOFRAN) IV, sennosides  Assessment/Plan: POD #9 s/p trach. Doing well, off vent for the past 48 hours. Trach changed to a cuffed #6 XLT today without difficulty. If continues to do well off vent, may change to a cuffles trach in 1 week.    LOS: 18 days   Rion Schnitzer,SUI W 06/30/2015, 8:10 AM

## 2015-06-30 NOTE — Progress Notes (Signed)
[facility] Thompson RN RN is aware that pt will need baribed. Says one is available. Pt rescreened for FLU vaccine  - had refused it upon admission but pt and family agreed that it would be a good idea- pt given Education sheet on flu vaccine. Pt given Flu shot in Lt Deltoid and Immunization record given to pt's mother as he wanted .

## 2015-06-30 NOTE — Progress Notes (Signed)
[facility] accompanied by RN & 2  NT with all belongings. Family aware  of pt transfer

## 2015-07-02 LAB — BLOOD GAS, ARTERIAL
ACID-BASE EXCESS: 12.9 mmol/L — AB (ref 0.0–2.0)
Bicarbonate: 39.2 mEq/L — ABNORMAL HIGH (ref 20.0–24.0)
FIO2: 0.4
O2 SAT: 91.7 %
PATIENT TEMPERATURE: 97.8
PCO2 ART: 74.4 mmHg — AB (ref 35.0–45.0)
TCO2: 41.5 mmol/L (ref 0–100)
pH, Arterial: 7.339 — ABNORMAL LOW (ref 7.350–7.450)
pO2, Arterial: 66.7 mmHg — ABNORMAL LOW (ref 80.0–100.0)

## 2015-07-02 LAB — BASIC METABOLIC PANEL
ANION GAP: 7 (ref 5–15)
BUN: 17 mg/dL (ref 6–20)
CALCIUM: 8.9 mg/dL (ref 8.9–10.3)
CO2: 37 mmol/L — AB (ref 22–32)
CREATININE: 0.92 mg/dL (ref 0.61–1.24)
Chloride: 91 mmol/L — ABNORMAL LOW (ref 101–111)
Glucose, Bld: 239 mg/dL — ABNORMAL HIGH (ref 65–99)
Potassium: 4.1 mmol/L (ref 3.5–5.1)
Sodium: 135 mmol/L (ref 135–145)

## 2015-07-02 LAB — MAGNESIUM: Magnesium: 1.7 mg/dL (ref 1.7–2.4)

## 2015-07-03 LAB — BASIC METABOLIC PANEL
Anion gap: 3 — ABNORMAL LOW (ref 5–15)
BUN: 17 mg/dL (ref 6–20)
CALCIUM: 9 mg/dL (ref 8.9–10.3)
CHLORIDE: 91 mmol/L — AB (ref 101–111)
CO2: 42 mmol/L — ABNORMAL HIGH (ref 22–32)
CREATININE: 0.92 mg/dL (ref 0.61–1.24)
GFR calc Af Amer: >60 mL/min (ref >60)
GFR calc non Af Amer: >60 mL/min (ref >60)
Glucose, Bld: 171 mg/dL — ABNORMAL HIGH (ref 65–99)
Potassium: 3.7 mmol/L (ref 3.5–5.1)
SODIUM: 136 mmol/L (ref 135–145)

## 2015-07-03 LAB — CBC
HCT: 36.5 % — ABNORMAL LOW (ref 39.0–52.0)
Hemoglobin: 11 g/dL — ABNORMAL LOW (ref 13.0–17.0)
MCH: 27.6 pg (ref 26.0–34.0)
MCHC: 30.1 g/dL (ref 30.0–36.0)
MCV: 91.5 fL (ref 78.0–100.0)
PLATELETS: 325 10*3/uL (ref 150–400)
RBC: 3.99 MIL/uL — ABNORMAL LOW (ref 4.22–5.81)
RDW: 13.1 % (ref 11.5–15.5)
WBC: 8.2 10*3/uL (ref 4.0–10.5)

## 2015-07-03 LAB — URIC ACID: URIC ACID, SERUM: 9.5 mg/dL — AB (ref 4.4–7.6)

## 2015-07-05 ENCOUNTER — Other Ambulatory Visit (HOSPITAL_COMMUNITY): Payer: Self-pay

## 2015-07-05 LAB — CBC WITH DIFFERENTIAL/PLATELET
BASOS ABS: 0 10*3/uL (ref 0.0–0.1)
BASOS PCT: 0 %
EOS ABS: 0.2 10*3/uL (ref 0.0–0.7)
EOS PCT: 2 %
HCT: 37.2 % — ABNORMAL LOW (ref 39.0–52.0)
HEMOGLOBIN: 11.2 g/dL — AB (ref 13.0–17.0)
Lymphocytes Relative: 20 %
Lymphs Abs: 2.1 10*3/uL (ref 0.7–4.0)
MCH: 27.2 pg (ref 26.0–34.0)
MCHC: 30.1 g/dL (ref 30.0–36.0)
MCV: 90.3 fL (ref 78.0–100.0)
Monocytes Absolute: 0.5 10*3/uL (ref 0.1–1.0)
Monocytes Relative: 4 %
NEUTROS PCT: 74 %
Neutro Abs: 7.6 10*3/uL (ref 1.7–7.7)
PLATELETS: 337 10*3/uL (ref 150–400)
RBC: 4.12 MIL/uL — AB (ref 4.22–5.81)
RDW: 13.5 % (ref 11.5–15.5)
WBC: 10.4 10*3/uL (ref 4.0–10.5)

## 2015-07-05 LAB — COMPREHENSIVE METABOLIC PANEL
ALBUMIN: 2.9 g/dL — AB (ref 3.5–5.0)
ALT: 35 U/L (ref 17–63)
AST: 26 U/L (ref 15–41)
Alkaline Phosphatase: 103 U/L (ref 38–126)
Anion gap: 7 (ref 5–15)
BUN: 13 mg/dL (ref 6–20)
CHLORIDE: 90 mmol/L — AB (ref 101–111)
CO2: 39 mmol/L — AB (ref 22–32)
CREATININE: 0.9 mg/dL (ref 0.61–1.24)
Calcium: 9.2 mg/dL (ref 8.9–10.3)
GFR calc non Af Amer: >60 mL/min (ref >60)
GLUCOSE: 192 mg/dL — AB (ref 65–99)
Potassium: 4.1 mmol/L (ref 3.5–5.1)
SODIUM: 136 mmol/L (ref 135–145)
Total Bilirubin: 0.4 mg/dL (ref 0.3–1.2)
Total Protein: 7.6 g/dL (ref 6.5–8.1)

## 2015-07-05 LAB — PHOSPHORUS: PHOSPHORUS: 4.1 mg/dL (ref 2.5–4.6)

## 2015-07-05 LAB — MAGNESIUM: MAGNESIUM: 1.5 mg/dL — AB (ref 1.7–2.4)

## 2015-07-11 LAB — BASIC METABOLIC PANEL
ANION GAP: 10 (ref 5–15)
BUN: 22 mg/dL — ABNORMAL HIGH (ref 6–20)
CALCIUM: 9.4 mg/dL (ref 8.9–10.3)
CO2: 39 mmol/L — ABNORMAL HIGH (ref 22–32)
Chloride: 90 mmol/L — ABNORMAL LOW (ref 101–111)
Creatinine, Ser: 0.99 mg/dL (ref 0.61–1.24)
GFR calc Af Amer: >60 mL/min (ref >60)
Glucose, Bld: 156 mg/dL — ABNORMAL HIGH (ref 65–99)
Potassium: 4 mmol/L (ref 3.5–5.1)
SODIUM: 139 mmol/L (ref 135–145)

## 2015-07-12 LAB — COMPREHENSIVE METABOLIC PANEL
ALBUMIN: 3 g/dL — AB (ref 3.5–5.0)
ALT: 57 U/L (ref 17–63)
AST: 40 U/L (ref 15–41)
Alkaline Phosphatase: 119 U/L (ref 38–126)
Anion gap: 6 (ref 5–15)
BUN: 20 mg/dL (ref 6–20)
CHLORIDE: 93 mmol/L — AB (ref 101–111)
CO2: 39 mmol/L — AB (ref 22–32)
Calcium: 9.1 mg/dL (ref 8.9–10.3)
Creatinine, Ser: 0.75 mg/dL (ref 0.61–1.24)
GFR calc Af Amer: >60 mL/min (ref >60)
GFR calc non Af Amer: >60 mL/min (ref >60)
GLUCOSE: 142 mg/dL — AB (ref 65–99)
POTASSIUM: 4 mmol/L (ref 3.5–5.1)
SODIUM: 138 mmol/L (ref 135–145)
Total Bilirubin: 0.6 mg/dL (ref 0.3–1.2)
Total Protein: 7.5 g/dL (ref 6.5–8.1)

## 2015-07-12 LAB — MAGNESIUM: Magnesium: 1.7 mg/dL (ref 1.7–2.4)

## 2015-07-12 LAB — CBC WITH DIFFERENTIAL/PLATELET
Basophils Absolute: 0 10*3/uL (ref 0.0–0.1)
Basophils Relative: 1 %
Eosinophils Absolute: 0.1 10*3/uL (ref 0.0–0.7)
Eosinophils Relative: 2 %
HCT: 35.7 % — ABNORMAL LOW (ref 39.0–52.0)
Hemoglobin: 11.1 g/dL — ABNORMAL LOW (ref 13.0–17.0)
Lymphocytes Relative: 36 %
Lymphs Abs: 1.9 10*3/uL (ref 0.7–4.0)
MCH: 28 pg (ref 26.0–34.0)
MCHC: 31.1 g/dL (ref 30.0–36.0)
MCV: 90.2 fL (ref 78.0–100.0)
Monocytes Absolute: 0.3 10*3/uL (ref 0.1–1.0)
Monocytes Relative: 6 %
Neutro Abs: 2.9 10*3/uL (ref 1.7–7.7)
Neutrophils Relative %: 55 %
Platelets: 179 10*3/uL (ref 150–400)
RBC: 3.96 MIL/uL — ABNORMAL LOW (ref 4.22–5.81)
RDW: 13.9 % (ref 11.5–15.5)
WBC: 5.2 10*3/uL (ref 4.0–10.5)

## 2015-07-12 LAB — PHOSPHORUS: Phosphorus: 5.2 mg/dL — ABNORMAL HIGH (ref 2.5–4.6)

## 2015-07-14 ENCOUNTER — Other Ambulatory Visit (HOSPITAL_COMMUNITY): Payer: Self-pay

## 2015-07-15 DIAGNOSIS — J9622 Acute and chronic respiratory failure with hypercapnia: Secondary | ICD-10-CM

## 2015-07-15 DIAGNOSIS — Z930 Tracheostomy status: Secondary | ICD-10-CM

## 2015-07-15 NOTE — Progress Notes (Signed)
PULMONARY / CRITICAL CARE MEDICINE   Name: Derrick [name] MRN: [id number] DOB: [birth year]    ADMISSION DATE:  06/30/2015 CONSULTATION DATE:  06/13/2015  REFERRING MD :  TRH  CHIEF COMPLAINT:  Hypercarbic respiratory failure and AMS  INITIAL PRESENTATION: 34 year old male with morbid obesity, OSA and OHV who presents to PCCM with AMS.  ABG was ordered and patient was noted to be hypercarbic and with respiratory acidosis.  Subsequently the patient developed hypoxemia as well.  Patient was transferred to the ICU and BiPAP was started.  F/U ABG post BiPAP showed no evidence of improvement and mental status continued to deteriorate so decision was made to intubate.   Ultimately underwent tracheostomy placement 11/22 and successfully liberated from vent.  PCCM called back 12/15 to assess for possible decannulation at pt/family request.   STUDIES:  CXR 11/13 >> with cardiomegally and pulmonary edema. Venous doppler 11/13 >> without DVT, superficial thrombosis, or Baker's cyst CXR 11/14 >> Hypoventilation with bibasilar atelectasis.  TTE 11/14 >> Mod concentric LV hypertrophy, EF 55-60%, grade 2 diastolic dysfunction, can't assess PV.  CXR 11/15 >> Slight interval deterioration in the pulmonary interstitium with increased pulmonary vascular prominence. Developing left lower lobe atelectasis is suspected.  CXR 11/17 >> Worsening pulmonary edema.  CXR 11/18 >> Slight improvement in pulmonary vascular congestion. Persistent bibasilar atelectasis or pneumonia CXR 11/19 >> Persistent mild lung base atelectasis and small effusions without pulmonary edema. CXR 11/21 >> Low lung vol, bibasilar atelectasis and small effusions Bronchoscopy 11/22 >> Impressive thin grey secretions with complete collapse rt main and all lobes rt and partial 50% left main  SIGNIFICANT EVENTS: 11/13  intubation for hypercarbic/hypoxic respiratory failure. 11/22  bronchoscopy, tracheostomy in OR by ENT   SUBJECTIVE:    Tol ATC 24/7 with PMV daytime.     VITAL SIGNS:   VSS. Reviewed at bedside. Abnormal values discussed in Imp/Plan.     INTAKE / OUTPUT: No intake or output data in the 24 hours ending 07/15/15 1620  PHYSICAL EXAMINATION: General:  Pleasant, morbidly obese male, NAD sitting OOB in chair  Neuro:  AAOx4, nonfocal, calm Cardiovascular:  s1 s2 RRR distant Lungs: resps even non labored on ATC, reduced, no rhonci Abdomen:  Soft, obese, ND and +BS. Musculoskeletal:  Scant BLE edema  Skin:  Intact, no new rashes  LABS:  CBC  Recent Labs Lab 07/12/15 0830  WBC 5.2  HGB 11.1*  HCT 35.7*  PLT 179   Coag's No results for input(s): APTT, INR in the last 168 hours. BMET  Recent Labs Lab 07/11/15 0605 07/12/15 0830  NA 139 138  K 4.0 4.0  CL 90* 93*  CO2 39* 39*  BUN 22* 20  CREATININE 0.99 0.75  GLUCOSE 156* 142*   Electrolytes  Recent Labs Lab 07/11/15 0605 07/12/15 0830  CALCIUM 9.4 9.1  MG  --  1.7  PHOS  --  5.2*   Sepsis Markers No results for input(s): LATICACIDVEN, PROCALCITON, O2SATVEN in the last 168 hours.   ABG No results for input(s): PHART, PCO2ART, PO2ART in the last 168 hours.   Liver Enzymes  Recent Labs Lab 07/12/15 0830  AST 40  ALT 57  ALKPHOS 119  BILITOT 0.6  ALBUMIN 3.0*   Cardiac Enzymes No results for input(s): TROPONINI, PROBNP in the last 168 hours.   Glucose No results for input(s): GLUCAP in the last 168 hours.  Imaging No results found.   ASSESSMENT / PLAN:  PULMONARY OETT 11/13 >> 11/22  Trach 11/22 >> A:  Hypercarbic VDRF - due to morbid obesity, OSA, OHV and CHF. P:   Cont ATC daytime as tolerated  Cont PMV  Consider trial occlusive cap daytime only  NOT candidate for decannulation until CONSIDERABLE weight loss given OSA/OHS  Once he has lost 150-200 lbs would repeat sleep study with trach capped before deciding on decannulation    Discussed at length with pt and his mom (30 mins).  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  However, he understands the severity of his OHS/OSA and now understands the long term treatment plan.  He seems motivated to lose the weight.    PCCM singing off please call back if needed.    Dirk Dress, NP 07/15/2015  4:20 PM Pager: [phone] or [phone]  Attending Note:  34 year old male with morbid obesity with trach in place for hypercarbic respiratory failure from excessive weight who would not wean.  PCCM was following but signed off.  Called back for ? Of decannulation.  I reviewed CXR myself, trach in good position.  Discussed with RT, PCCM-NP and RN.  Lengthy discussion with patient and mother.  Explained to them that decannulation can not be done right given weight.  Will need to loose 200 lbs or so then will perform a sleep study with the trach cap then will consider decannulation then and only then.  Keep trach in for now, no decannulation.  Patient seen and examined, agree with above note.  I dictated the care and orders written for this patient under my direction.  Alyson Reedy, MD [phone]

## 2015-07-21 LAB — CBC WITH DIFFERENTIAL/PLATELET
BASOS ABS: 0 10*3/uL (ref 0.0–0.1)
BASOS PCT: 0 %
EOS ABS: 0.2 10*3/uL (ref 0.0–0.7)
EOS PCT: 3 %
HCT: 33.4 % — ABNORMAL LOW (ref 39.0–52.0)
HEMOGLOBIN: 10.6 g/dL — AB (ref 13.0–17.0)
Lymphocytes Relative: 36 %
Lymphs Abs: 2.1 10*3/uL (ref 0.7–4.0)
MCH: 28.7 pg (ref 26.0–34.0)
MCHC: 31.7 g/dL (ref 30.0–36.0)
MCV: 90.5 fL (ref 78.0–100.0)
Monocytes Absolute: 0.3 10*3/uL (ref 0.1–1.0)
Monocytes Relative: 4 %
NEUTROS PCT: 57 %
Neutro Abs: 3.4 10*3/uL (ref 1.7–7.7)
PLATELETS: 178 10*3/uL (ref 150–400)
RBC: 3.69 MIL/uL — AB (ref 4.22–5.81)
RDW: 14.6 % (ref 11.5–15.5)
WBC: 5.9 10*3/uL (ref 4.0–10.5)

## 2015-07-21 LAB — BASIC METABOLIC PANEL
ANION GAP: 9 (ref 5–15)
BUN: 13 mg/dL (ref 6–20)
CO2: 31 mmol/L (ref 22–32)
Calcium: 9 mg/dL (ref 8.9–10.3)
Chloride: 99 mmol/L — ABNORMAL LOW (ref 101–111)
Creatinine, Ser: 0.81 mg/dL (ref 0.61–1.24)
Glucose, Bld: 171 mg/dL — ABNORMAL HIGH (ref 65–99)
POTASSIUM: 3.9 mmol/L (ref 3.5–5.1)
SODIUM: 139 mmol/L (ref 135–145)

## 2015-07-30 ENCOUNTER — Encounter (HOSPITAL_BASED_OUTPATIENT_CLINIC_OR_DEPARTMENT_OTHER): Payer: Self-pay | Admitting: *Deleted

## 2015-07-30 ENCOUNTER — Emergency Department (HOSPITAL_BASED_OUTPATIENT_CLINIC_OR_DEPARTMENT_OTHER): Payer: 59

## 2015-07-30 ENCOUNTER — Emergency Department (HOSPITAL_BASED_OUTPATIENT_CLINIC_OR_DEPARTMENT_OTHER)
Admission: EM | Admit: 2015-07-30 | Discharge: 2015-07-30 | Disposition: A | Discharge status: Home / Self Care | Payer: 59 | Attending: Emergency Medicine | Admitting: Emergency Medicine

## 2015-07-30 DIAGNOSIS — I10 Essential (primary) hypertension: Secondary | ICD-10-CM | POA: Diagnosis not present

## 2015-07-30 DIAGNOSIS — Z7982 Long term (current) use of aspirin: Secondary | ICD-10-CM | POA: Insufficient documentation

## 2015-07-30 DIAGNOSIS — Z79899 Other long term (current) drug therapy: Secondary | ICD-10-CM | POA: Diagnosis not present

## 2015-07-30 DIAGNOSIS — L299 Pruritus, unspecified: Secondary | ICD-10-CM | POA: Diagnosis present

## 2015-07-30 DIAGNOSIS — Z930 Tracheostomy status: Secondary | ICD-10-CM | POA: Insufficient documentation

## 2015-07-30 DIAGNOSIS — E119 Type 2 diabetes mellitus without complications: Secondary | ICD-10-CM | POA: Insufficient documentation

## 2015-07-30 DIAGNOSIS — Z794 Long term (current) use of insulin: Secondary | ICD-10-CM | POA: Insufficient documentation

## 2015-07-30 DIAGNOSIS — M79672 Pain in left foot: Secondary | ICD-10-CM | POA: Insufficient documentation

## 2015-07-30 DIAGNOSIS — I5033 Acute on chronic diastolic (congestive) heart failure: Secondary | ICD-10-CM | POA: Diagnosis not present

## 2015-07-30 DIAGNOSIS — K219 Gastro-esophageal reflux disease without esophagitis: Secondary | ICD-10-CM | POA: Diagnosis not present

## 2015-07-30 DIAGNOSIS — L03311 Cellulitis of abdominal wall: Secondary | ICD-10-CM | POA: Diagnosis not present

## 2015-07-30 LAB — CBC WITH DIFFERENTIAL/PLATELET
BASOS PCT: 1 %
Basophils Absolute: 0.1 10*3/uL (ref 0.0–0.1)
EOS ABS: 0.1 10*3/uL (ref 0.0–0.7)
Eosinophils Relative: 1 %
HCT: 33.7 % — ABNORMAL LOW (ref 39.0–52.0)
Hemoglobin: 10.8 g/dL — ABNORMAL LOW (ref 13.0–17.0)
LYMPHS ABS: 2.2 10*3/uL (ref 0.7–4.0)
Lymphocytes Relative: 18 %
MCH: 28.6 pg (ref 26.0–34.0)
MCHC: 32 g/dL (ref 30.0–36.0)
MCV: 89.2 fL (ref 78.0–100.0)
MONO ABS: 0.7 10*3/uL (ref 0.1–1.0)
MONOS PCT: 5 %
Neutro Abs: 9.4 10*3/uL — ABNORMAL HIGH (ref 1.7–7.7)
Neutrophils Relative %: 75 %
Platelets: 225 10*3/uL (ref 150–400)
RBC: 3.78 MIL/uL — ABNORMAL LOW (ref 4.22–5.81)
RDW: 14.6 % (ref 11.5–15.5)
WBC: 12.4 10*3/uL — ABNORMAL HIGH (ref 4.0–10.5)

## 2015-07-30 LAB — BASIC METABOLIC PANEL
Anion gap: 9 (ref 5–15)
BUN: 13 mg/dL (ref 6–20)
CALCIUM: 8.7 mg/dL — AB (ref 8.9–10.3)
CO2: 31 mmol/L (ref 22–32)
CREATININE: 0.87 mg/dL (ref 0.61–1.24)
Chloride: 96 mmol/L — ABNORMAL LOW (ref 101–111)
GFR calc non Af Amer: >60 mL/min (ref >60)
Glucose, Bld: 150 mg/dL — ABNORMAL HIGH (ref 65–99)
Potassium: 3.1 mmol/L — ABNORMAL LOW (ref 3.5–5.1)
SODIUM: 136 mmol/L (ref 135–145)

## 2015-07-30 MED ORDER — POTASSIUM CHLORIDE CRYS ER 20 MEQ PO TBCR
40.0000 meq | EXTENDED_RELEASE_TABLET | Freq: Once | ORAL | Status: AC
  Administered 2015-07-30: 40 meq via ORAL
  Filled 2015-07-30: qty 2

## 2015-07-30 MED ORDER — DOXYCYCLINE HYCLATE 100 MG PO TABS
100.0000 mg | ORAL_TABLET | Freq: Once | ORAL | Status: AC
  Administered 2015-07-30: 100 mg via ORAL
  Filled 2015-07-30: qty 1

## 2015-07-30 MED ORDER — CEFTRIAXONE SODIUM 1 G IJ SOLR
1.0000 g | Freq: Once | INTRAMUSCULAR | Status: AC
  Administered 2015-07-30: 1 g via INTRAMUSCULAR
  Filled 2015-07-30: qty 10

## 2015-07-30 MED ORDER — OXYCODONE-ACETAMINOPHEN 5-325 MG PO TABS
2.0000 | ORAL_TABLET | Freq: Once | ORAL | Status: AC
  Administered 2015-07-30: 2 via ORAL
  Filled 2015-07-30: qty 2

## 2015-07-30 MED ORDER — POTASSIUM CHLORIDE CRYS ER 20 MEQ PO TBCR: 40.0000 meq | EXTENDED_RELEASE_TABLET | Freq: Two times a day (BID) | ORAL | Status: DC

## 2015-07-30 MED ORDER — DOXYCYCLINE HYCLATE 100 MG PO CAPS: 100.0000 mg | ORAL_CAPSULE | Freq: Two times a day (BID) | ORAL | Status: DC

## 2015-07-30 MED ORDER — LIDOCAINE HCL (PF) 1 % IJ SOLN
INTRAMUSCULAR | Status: AC
  Administered 2015-07-30: 5 mL
  Filled 2015-07-30: qty 5

## 2015-07-30 MED ORDER — DEXTROSE 5 % IV SOLN: 1.0000 g | Freq: Once | INTRAVENOUS | Status: DC

## 2015-07-30 NOTE — Discharge Instructions (Signed)
Please take all antibiotics as prescribed. Return to the hospital if there is any spreading of the redness, fever, or you feel more ill. Take potassium for the next 5 days as prescribed. Recheck with your doctor Monday.  Cellulitis Cellulitis is an infection of the skin and the tissue beneath it. The infected area is usually red and tender. Cellulitis occurs most often in the arms and lower legs.  CAUSES  Cellulitis is caused by bacteria that enter the skin through cracks or cuts in the skin. The most common types of bacteria that cause cellulitis are staphylococci and streptococci. SIGNS AND SYMPTOMS   Redness and warmth.  Swelling.  Tenderness or pain.  Fever. DIAGNOSIS  Your health care provider can usually determine what is wrong based on a physical exam. Blood tests may also be done. TREATMENT  Treatment usually involves taking an antibiotic medicine. HOME CARE INSTRUCTIONS   Take your antibiotic medicine as directed by your health care provider. Finish the antibiotic even if you start to feel better.  Keep the infected arm or leg elevated to reduce swelling.  Apply a warm cloth to the affected area up to 4 times per day to relieve pain.  Take medicines only as directed by your health care provider.  Keep all follow-up visits as directed by your health care provider. SEEK MEDICAL CARE IF:   You notice red streaks coming from the infected area.  Your red area gets larger or turns dark in color.  Your bone or joint underneath the infected area becomes painful after the skin has healed.  Your infection returns in the same area or another area.  You notice a swollen bump in the infected area.  You develop new symptoms.  You have a fever. SEEK IMMEDIATE MEDICAL CARE IF:   You feel very sleepy.  You develop vomiting or diarrhea.  You have a general ill feeling (malaise) with muscle aches and pains.   This information is not intended to replace advice given to you  by your health care provider. Make sure you discuss any questions you have with your health care provider.   Document Released: 04/26/2005 Document Revised: 04/07/2015 Document Reviewed: 10/02/2011 Elsevier Interactive Patient Education [employer]2016 Elsevier Inc.

## 2015-07-30 NOTE — ED Notes (Signed)
Pts mother called RN to room. Patient requesting pain medication for foot pain after transferring from bed to wheelchair. EDP notified.

## 2015-07-30 NOTE — ED Notes (Signed)
Left foot is swelling and painful. He has an abscess on her upper abdomen. It feels warm to touch, painful and swollen. States was was in the hospital for 40 days for CHF and was just released last week.

## 2015-07-30 NOTE — ED Provider Notes (Signed)
CSN: [id number]     Arrival date & time 07/30/15  1740 History  By signing my name below, I, Emmanuella Mensah, attest that this documentation has been prepared under the direction and in the presence of Margarita Grizzle, MD. Electronically Signed: Angelene Giovanni, ED Scribe. 07/30/2015. 7:11 PM.    Chief Complaint  Patient presents with  . Foot Pain   Patient is a 34 y.o. male presenting with lower extremity pain. The history is provided by the patient. No language interpreter was used.  Foot Pain This is a new problem. The current episode started more than 2 days ago. The problem occurs rarely. The problem has been gradually worsening. Nothing aggravates the symptoms. Nothing relieves the symptoms. He has tried acetaminophen for the symptoms.   HPI Comments: Derrick [name] is a 34 y.o. male with a hx of HTN, DM, and CHF who presents to the Emergency Department complaining of a non-painful itchy area of redness and swelling on his abdomen onset several days ago. He explains that the area is only painful to touch.  Pt reports that he was in [facility] days for CHF and then stayed in Rehab for 2 weeks. He denies currently being on antibiotics. No alleviating factors noted. Not rapidly spreading, no fever, chills, some clear discharge.  Pt also c/o gradually worsening intermittent left foot pain and swelling onset several days ago. He denies any recent injuries or trauma. He adds that he recently finished physical therapy and he has a hx of gout. He explains that the pain is worse when he is sitting down. He states that he took pain medication yesterday with no relief.   PCP: Dr. Adah Perl Upcoming appointment 08/10/15.    Past Medical History  Diagnosis Date  . Hypertension   . Reflux   . Diabetes mellitus without complication (HCC)   . Acute on chronic diastolic CHF (congestive heart failure), NYHA class 1 (HCC)    Past Surgical History  Procedure Laterality Date  .  Tracheostomy tube placement N/A 06/21/2015    Procedure: TRACHEOSTOMY;  Surgeon: Newman Pies, MD;  Location: [facility] OR;  Service: ENT;  Laterality: N/A;   Family History  Problem Relation Age of Onset  . Diabetes Maternal Grandmother   . Hypertension Maternal Grandmother    Social History  Substance Use Topics  . Smoking status: Never Smoker   . Smokeless tobacco: Never Used  . Alcohol Use: No    Review of Systems  Constitutional: Negative for fever.  Musculoskeletal: Positive for joint swelling and arthralgias.  Skin:       Area of swelling on abdomen  All other systems reviewed and are negative.     Allergies  Vancomycin and Zosyn  Home Medications   Prior to Admission medications   Medication Sig Start Date End Date Taking? Authorizing Provider  albuterol (PROVENTIL HFA;VENTOLIN HFA) 108 (90 BASE) MCG/ACT inhaler Inhale 2 puffs into the lungs every 2 (two) hours as needed for wheezing or shortness of breath (cough). 06/19/14   Wayland Salinas, MD  allopurinol (ZYLOPRIM) 300 MG tablet Take 300 mg by mouth daily.    Historical Provider, MD  aspirin 81 MG tablet Take 81 mg by mouth daily.    Historical Provider, MD  bisacodyl (DULCOLAX) 10 MG suppository Place 1 suppository (10 mg total) rectally daily as needed for moderate constipation. 06/30/15   Richarda Overlie, MD  chlorhexidine (PERIDEX) 0.12 % solution 15 mLs by Mouth Rinse route 2 (two) times daily. 06/30/15  Richarda Overlie, MD  furosemide (LASIX) 40 MG tablet Take 1.5 tablets (60 mg total) by mouth 3 (three) times daily. 06/30/15   Richarda Overlie, MD  glimepiride (AMARYL) 4 MG tablet Take 4 mg by mouth daily with breakfast.    Historical Provider, MD  insulin aspart (NOVOLOG) 100 UNIT/ML injection Question Answer Comment    Correction coverage: Resistant (obese, steroids)    CBG < 70: implement hypoglycemia protocol    CBG 70 - 120: 0 units    CBG 121 - 150: 3 units    CBG 151 - 200: 4 units    CBG 201 - 250: 7  units    CBG 251 - 300: 11 units    CBG 301 - 350: 15 units    CBG 351 - 400: 20 units    CBG > 400 call MD and obtain STAT lab verification 06/30/15   Richarda Overlie, MD  insulin glargine (LANTUS) 100 UNIT/ML injection Inject 0.2 mLs (20 Units total) into the skin at bedtime. 06/30/15   Richarda Overlie, MD  loratadine (CLARITIN) 10 MG tablet Take 10 mg by mouth daily.    Historical Provider, MD  methadone (DOLOPHINE) 5 MG tablet Take 0.5 tablets (2.5 mg total) by mouth daily. 06/30/15   Richarda Overlie, MD  pantoprazole sodium (PROTONIX) 40 mg/20 mL PACK Place 20 mLs (40 mg total) into feeding tube daily. 06/30/15   Richarda Overlie, MD  polyethylene glycol (MIRALAX / GLYCOLAX) packet Take 17 g by mouth daily. 06/30/15   Richarda Overlie, MD  risperiDONE (RISPERDAL) 1 MG/ML oral solution Take 0.5 mLs (0.5 mg total) by mouth at bedtime. 06/30/15   Richarda Overlie, MD  sennosides (SENOKOT) 8.8 MG/5ML syrup Place 5 mLs into feeding tube 2 (two) times daily as needed for mild constipation. 06/30/15   Richarda Overlie, MD   BP 163/105 mmHg  Pulse 110  Temp(Src) 97.9 F (36.6 C) (Oral)  Resp 18  Ht  (1.778 m)  Wt 426 lb (193.232 kg)  BMI 61.12 kg/m2  SpO2 97% Physical Exam  Constitutional: He is oriented to person, place, and time. He appears well-developed and well-nourished. No distress.  Morbidly obese  HENT:  Head: Normocephalic and atraumatic.  Right Ear: External ear normal.  Left Ear: External ear normal.  Nose: Nose normal.  Mouth/Throat: Oropharynx is clear and moist.  Eyes: Conjunctivae and EOM are normal.  Neck: Neck supple. No tracheal deviation present.  Tracheostomy in place  Cardiovascular: Normal rate.   Pulmonary/Chest: Effort normal and breath sounds normal. No respiratory distress.  Abdominal: Soft.    2 x 3 cm area of erythema with some induration with no area of fluctuance noted  Musculoskeletal: Normal range of motion. He exhibits tenderness.        Feet:  Neurological: He is alert and oriented to person, place, and time.  Skin: Skin is warm and dry.  Psychiatric: He has a normal mood and affect. His behavior is normal.  Nursing note and vitals reviewed.   ED Course  Procedures (including critical care time) DIAGNOSTIC STUDIES: Oxygen Saturation is 97% on RA, adequate by my interpretation.    COORDINATION OF CARE: 7:07 PM- Pt advised of plan for treatment and pt agrees. Pt will receive lab work and x-Brettany Sydney for further evaluation.    Labs Review Labs Reviewed - No data to display  Imaging Review No results found.   Margarita Grizzle, MD has personally reviewed and evaluated these images and lab results as part of my  medical decision-making.   EKG Interpretation None      MDM   1- cellulitis- abdominal wall cellulitis- patient given Rocephin and doxycycline here. White blood cell count is elevated at 12,400 with no left shift noted. He is afebrile. CT done reveals no evidence of abscess but is consistent with cellulitis. I have discussed the need for close follow-up and return precautions if this is to become rapidly spreading, fever, or appears more ill in any way. I discussed with the patient that he has a tenuous medical condition and we would like to be able to treat this as an outpatient has hospitalization could incur further complications. However, I have explained that any deterioration would require his return for hospitalization. 2 left ankle pain-patient states this is consistent with his previous gout. X-Korine Winton reveals no evidence of fracture. We discussed using ibuprofen and ice and elevation. He is able to walk on it during his physical therapy. I've advised that he use his home pain medicine prudently and uses it only prior to physical therapy. 3 hypokalemia patient with mild hypokalemia here and is given oral repletion with a prescription for 3 days duration 4 diabetes patient's blood sugar here is 150 and he reveals no  signs of DKA. I personally performed the services described in this documentation, which was scribed in my presence. The recorded information has been reviewed and considered.   Margarita Grizzleanielle Tangia Pinard, MD 07/31/15 [phone]

## 2015-08-10 ENCOUNTER — Encounter: Payer: Self-pay | Admitting: Internal Medicine

## 2015-08-10 ENCOUNTER — Other Ambulatory Visit (INDEPENDENT_AMBULATORY_CARE_PROVIDER_SITE_OTHER): Payer: 59

## 2015-08-10 ENCOUNTER — Ambulatory Visit (INDEPENDENT_AMBULATORY_CARE_PROVIDER_SITE_OTHER): Payer: 59 | Admitting: Internal Medicine

## 2015-08-10 VITALS — BP 130/92 | HR 93 | Temp 98.1°F | Resp 18 | Ht 70.0 in | Wt >= 6400 oz

## 2015-08-10 DIAGNOSIS — M25572 Pain in left ankle and joints of left foot: Secondary | ICD-10-CM | POA: Diagnosis not present

## 2015-08-10 DIAGNOSIS — E1169 Type 2 diabetes mellitus with other specified complication: Secondary | ICD-10-CM

## 2015-08-10 DIAGNOSIS — G4733 Obstructive sleep apnea (adult) (pediatric): Secondary | ICD-10-CM

## 2015-08-10 DIAGNOSIS — E669 Obesity, unspecified: Secondary | ICD-10-CM

## 2015-08-10 DIAGNOSIS — I10 Essential (primary) hypertension: Secondary | ICD-10-CM

## 2015-08-10 DIAGNOSIS — E662 Morbid (severe) obesity with alveolar hypoventilation: Secondary | ICD-10-CM

## 2015-08-10 DIAGNOSIS — I5032 Chronic diastolic (congestive) heart failure: Secondary | ICD-10-CM

## 2015-08-10 DIAGNOSIS — E119 Type 2 diabetes mellitus without complications: Secondary | ICD-10-CM

## 2015-08-10 LAB — CBC
HCT: 35.8 % — ABNORMAL LOW (ref 39.0–52.0)
Hemoglobin: 11.4 g/dL — ABNORMAL LOW (ref 13.0–17.0)
MCHC: 31.7 g/dL (ref 30.0–36.0)
MCV: 88.1 fl (ref 78.0–100.0)
PLATELETS: 257 10*3/uL (ref 150.0–400.0)
RBC: 4.06 Mil/uL — ABNORMAL LOW (ref 4.22–5.81)
RDW: 16.1 % — ABNORMAL HIGH (ref 11.5–15.5)
WBC: 11.4 10*3/uL — ABNORMAL HIGH (ref 4.0–10.5)

## 2015-08-10 LAB — LIPID PANEL
Cholesterol: 155 mg/dL (ref 0–200)
HDL: 51.5 mg/dL (ref >39.00)
LDL Cholesterol: 76 mg/dL (ref 0–99)
NONHDL: 103.57
Total CHOL/HDL Ratio: 3
Triglycerides: 138 mg/dL (ref 0.0–149.0)
VLDL: 27.6 mg/dL (ref 0.0–40.0)

## 2015-08-10 LAB — COMPREHENSIVE METABOLIC PANEL
ALT: 30 U/L (ref 0–53)
AST: 24 U/L (ref 0–37)
Albumin: 4.2 g/dL (ref 3.5–5.2)
Alkaline Phosphatase: 152 U/L — ABNORMAL HIGH (ref 39–117)
BILIRUBIN TOTAL: 0.5 mg/dL (ref 0.2–1.2)
BUN: 7 mg/dL (ref 6–23)
CO2: 39 meq/L — AB (ref 19–32)
CREATININE: 0.71 mg/dL (ref 0.40–1.50)
Calcium: 9.7 mg/dL (ref 8.4–10.5)
Chloride: 97 mEq/L (ref 96–112)
GFR: 164.12 mL/min (ref >60.00)
GLUCOSE: 143 mg/dL — AB (ref 70–99)
Potassium: 3.5 mEq/L (ref 3.5–5.1)
SODIUM: 142 meq/L (ref 135–145)
Total Protein: 8.1 g/dL (ref 6.0–8.3)

## 2015-08-10 LAB — URIC ACID: URIC ACID, SERUM: 4.8 mg/dL (ref 4.0–7.8)

## 2015-08-10 LAB — HEMOGLOBIN A1C: HEMOGLOBIN A1C: 7.5 % — AB (ref 4.6–6.5)

## 2015-08-10 MED ORDER — ACCU-CHEK NANO SMARTVIEW W/DEVICE KIT: PACK | Status: DC

## 2015-08-10 NOTE — Progress Notes (Signed)
Pre visit review using our clinic review tool, if applicable. No additional management support is needed unless otherwise documented below in the visit note. 

## 2015-08-10 NOTE — Patient Instructions (Addendum)
We are checking the labs today and will call you back about the results.   We will let you know if we can change the diabetes medicine and if you need potassium.   Please remember to get your eye exam as this is important for your health.   The surgery visits are through [facility] and we have placed a referral and you will hear back about that appointment.   Please keep the visit with the lung doctor and talk to them about the sleep study to see what they think.

## 2015-08-11 DIAGNOSIS — E118 Type 2 diabetes mellitus with unspecified complications: Secondary | ICD-10-CM | POA: Insufficient documentation

## 2015-08-11 DIAGNOSIS — Z8639 Personal history of other endocrine, nutritional and metabolic disease: Secondary | ICD-10-CM | POA: Insufficient documentation

## 2015-08-11 DIAGNOSIS — M25572 Pain in left ankle and joints of left foot: Secondary | ICD-10-CM | POA: Insufficient documentation

## 2015-08-11 NOTE — Assessment & Plan Note (Signed)
Suspect coming from multiple etiology including morbid obesity, arthritis (seen on recent x-ray) and his deconditioning. Refer to ortho for evaluation.

## 2015-08-11 NOTE — Assessment & Plan Note (Addendum)
Checking HgA1c, not able to get to foot exam today with complicated history and multiple concerns. Address at next visit. Currently taking metformin 1000 mg BID with some loose stools. Adjust as needed. Rx for meter given at visit.

## 2015-08-11 NOTE — Assessment & Plan Note (Signed)
BP is slightly above goal today with recheck very similar. He is taking amlodipine 10 mg, lasix 60 mg daily, lopressor 50 mg BID. Not fluid overloaded. Checking labs today as not on potassium. Unclear why he is not on ACE-I or ARB since he is diabetic.

## 2015-08-11 NOTE — Assessment & Plan Note (Signed)
S/P trach. Informed him that he will need to discuss sleep study with pulmonary (visit next week) and that it would not be my advice to have them work on removing trach at this time.

## 2015-08-11 NOTE — Assessment & Plan Note (Signed)
No exacerbation today, he is taking lasix without potassium due to running out so checking CMP today and adjust as needed.

## 2015-08-11 NOTE — Assessment & Plan Note (Addendum)
Weight is up from discharge from rehab facility. Talked to him about the importance of losing weight. They would like to pursue weight loss surgery. Referral done to general surgery. They have completed the online webinar from [facility]. Complicated by hypertension, diabetes, arthritis.

## 2015-08-11 NOTE — Progress Notes (Signed)
   Subjective:    Patient ID: Derrick [name], male    DOB: Jun 29, 1981, 35 y.o.   MRN: [id number]  HPI The patient is a new 35 YO man coming in with several concerns. He had trach currently from OHS/OSA and was in the hospital with acute then chronic respiratory failure and was not able to wean from vent. The trach was established and he was told at that time he would need to lose 200 pounds to get it removed as his disease is that severe. He states that some doctor came to his house and told him that given his progress with physical activity she would do another sleep test and get it removed. He wants to know if we can get that done.  Next concern is his sugars. He has been taking metformin since leaving the hospital (was on lantus and novolog in rehab and hospital) and has not been monitoring his sugars. Weight is up about 7 pounds since discharge from rehab but down from initial admission to hospital. He denies low sugars. He is having some loose stools from the metformin and wants to see if he can change. He is still working with PT at home.  Next concern is abdominal cellulitis which is improving although there is still an open spot. Next concern is his left ankle pain which we are not able to fully address.   [facility], social history reviewed and updated.   Review of Systems  Constitutional: Positive for activity change. Negative for fever, chills, appetite change and fatigue.  HENT: Negative.   Eyes: Negative.   Respiratory: Negative for cough, chest tightness, shortness of breath and wheezing.   Cardiovascular: Positive for leg swelling. Negative for chest pain and palpitations.  Gastrointestinal: Positive for diarrhea. Negative for nausea, vomiting, abdominal pain, constipation, blood in stool and abdominal distention.  Musculoskeletal: Positive for arthralgias. Negative for myalgias, back pain and gait problem.  Skin: Positive for wound. Negative for color change, pallor and rash.    Neurological: Negative for dizziness, weakness, light-headedness and headaches.  Psychiatric/Behavioral: Negative.       Objective:   Physical Exam  Constitutional: He is oriented to person, place, and time. He appears well-developed and well-nourished.  Morbidly obese  HENT:  Head: Normocephalic and atraumatic.  Nose: Nose normal.  Mouth/Throat: Oropharynx is clear and moist.  Eyes: EOM are normal.  Neck: Normal range of motion.  Does not wish to show his trach site but states clean and dry  Cardiovascular: Normal rate and regular rhythm.   Pulmonary/Chest: Effort normal and breath sounds normal. No respiratory distress. He has no wheezes. He has no rales.  Abdominal: Soft. Bowel sounds are normal. He exhibits no distension. There is no tenderness. There is no rebound.  Spot on stomach without stigmata of cellulitis, there is a small sore without purulent discharge or fluctuance  Musculoskeletal:  1+ bilateral edema  Neurological: He is alert and oriented to person, place, and time. Coordination normal.  Skin: Skin is warm and dry.  Psychiatric: He has a normal mood and affect.   Filed Vitals:   08/10/15 1351  BP: 130/92  Pulse: 93  Temp: 98.1 F (36.7 C)  TempSrc: Oral  Resp: 18  Height: 5\' 10"  (1.778 m)  Weight: 433 lb 12.8 oz (196.77 kg)  SpO2: 97%      Assessment & Plan:

## 2015-08-11 NOTE — Assessment & Plan Note (Signed)
S/P trach, weight stable but not decreasing significantly.

## 2015-08-12 ENCOUNTER — Telehealth: Payer: Self-pay

## 2015-08-12 NOTE — Telephone Encounter (Signed)
Patty called back to check up on this request at 5:40pm. Advise her that Dr. Okey Duprerawford left for today and offer her to speak to the nurse line (patty refuse to speak to the nurse line and request for our office to call the pt and explain why he has not get the call back today). Ask susan to help

## 2015-08-12 NOTE — Telephone Encounter (Signed)
Message from Home health was routed p hours; the message per the receptionist who [facility] demanded someone call this patient. Was told this was after hours; hte HHN stated patient was angry and needed call and refused to call after hours number.  Call to the patient; the patient was not upset; Stated that he was concerned about his BP; but had been up and ankle was hurting and had not taken pain med. The patient BP was 150 over 100 something; Denies chest pain; sob, blurred vision, loss of sensation, numbness or other difficulty;  Also stated foot really had not changed; was not discolored; toes were warm and dry, no red streaks or other. Was confused about how to take lasix and reviewed order per medication for 1.5 tablets po 3 times per day.  The patient stated he would go to ER if any symptoms changed; but was not concerned at this time; will take his pain med and lie down and rest.  Stated I will fup with Dr. Okey Duprerawford tomorrow for any further recommendation. The patient stated he was ok. Has the after hours number and will go to ER if he determines that he needs to go.

## 2015-08-12 NOTE — Telephone Encounter (Signed)
Recd call from patty/home health--patient has been consistently running 160's and 170's systolic---110 diastolic----patient currently on amlodipine and metoprolol---more noticeable swelling in left ankle measuring 3 cm larger than right ankle---looks like patient was here a couple of days ago with lab work----please advise, i will call patty back---also patty is requesting to extend home health orders in order to follow bp, swelling issues with patient

## 2015-08-13 ENCOUNTER — Telehealth: Payer: Self-pay | Admitting: Geriatric Medicine

## 2015-08-13 ENCOUNTER — Telehealth: Payer: Self-pay | Admitting: Internal Medicine

## 2015-08-13 MED ORDER — POTASSIUM CHLORIDE CRYS ER 20 MEQ PO TBCR: 20.0000 meq | EXTENDED_RELEASE_TABLET | Freq: Two times a day (BID) | ORAL | Status: DC

## 2015-08-13 NOTE — Telephone Encounter (Signed)
Called and spoke with patient. He gave me verbal ok to speak to his mother, [place]. I informed Rosa that the patient should be taking 1.5 pills of lasix TID and that Dr. Okey Duprerawford has sent in potassium as well. If the pressure does not come down patient will need to come in for an ov. Patient and mother agreed.

## 2015-08-13 NOTE — Telephone Encounter (Signed)
Patty with [facility] wants to know if you can refer this patient to a nutritionist. Please advise, thanks.

## 2015-08-13 NOTE — Telephone Encounter (Signed)
Patient bp yesterday was 160/110

## 2015-08-13 NOTE — Telephone Encounter (Signed)
Needs call back on patients BP.

## 2015-08-13 NOTE — Telephone Encounter (Signed)
In review of his blood work it appears that he is not taking his lasix which is likely why his swelling is increased and the blood pressure is increased. Would recommend to start taking per our direction (explained by Darl PikesSusan last night) of 1.5 pills TID lasix (40 mg pills). We have sent in potassium pills to be taken 1 pill twice daily with the lasix to prevent low potassium pills. Okay to extend home health. If BP not back to normal with his lasix recommend visit.

## 2015-08-13 NOTE — Addendum Note (Signed)
Addended by: Hillard DankerRAWFORD, Beverely Suen A on: 08/13/2015 08:30 AM   Modules accepted: Orders

## 2015-08-13 NOTE — Telephone Encounter (Signed)
Will address at next visit 

## 2015-08-17 ENCOUNTER — Telehealth: Payer: Self-pay | Admitting: Internal Medicine

## 2015-08-17 NOTE — Telephone Encounter (Signed)
Chris call from [facility] state that Mr. Derrick [name] went the orthopedic appt today but they can not see him because they do not have the x-ray with him. Pt has ankle pain. 10 of 10 and they were wondering what Dr. Okey Dupre recommend what to do about this until he has an appt reschedule with them. Please give her a call back

## 2015-08-17 NOTE — Telephone Encounter (Signed)
He can use tylenol for pain as needed. Up to 3000 mg tylenol daily.

## 2015-08-17 NOTE — Telephone Encounter (Signed)
Called and left a message on voice mail informing [name] that patient can take up to 3000 mg of Tylenol daily.

## 2015-08-18 ENCOUNTER — Institutional Professional Consult (permissible substitution): Payer: Self-pay | Admitting: Internal Medicine

## 2015-08-23 ENCOUNTER — Other Ambulatory Visit: Payer: Self-pay | Admitting: Geriatric Medicine

## 2015-08-23 ENCOUNTER — Telehealth: Payer: Self-pay | Admitting: Internal Medicine

## 2015-08-23 MED ORDER — METFORMIN HCL 1000 MG PO TABS: 1000.0000 mg | ORAL_TABLET | Freq: Two times a day (BID) | ORAL | Status: DC

## 2015-08-23 MED ORDER — FUROSEMIDE 40 MG PO TABS: 60.0000 mg | ORAL_TABLET | Freq: Three times a day (TID) | ORAL | Status: DC

## 2015-08-23 MED ORDER — AMLODIPINE BESYLATE 10 MG PO TABS
10.0000 mg | ORAL_TABLET | Freq: Every day | ORAL | Status: DC
Start: 2015-08-23 — End: 2015-08-25

## 2015-08-23 MED ORDER — METOPROLOL TARTRATE 50 MG PO TABS: 50.0000 mg | ORAL_TABLET | Freq: Two times a day (BID) | ORAL | Status: DC

## 2015-08-23 NOTE — Telephone Encounter (Signed)
Sent to pharmacy 

## 2015-08-23 NOTE — Telephone Encounter (Signed)
Pt called stated that he needs all of this med refill and send to [facility]. Pt is very concern about the lasix because he is taking 1.5 pill a daty (he wants to make sure he got enough). Please help, he said he is trying to get with our office since last week but never heard anything back.

## 2015-08-25 ENCOUNTER — Other Ambulatory Visit: Payer: Self-pay | Admitting: Geriatric Medicine

## 2015-08-25 ENCOUNTER — Telehealth: Payer: Self-pay | Admitting: Geriatric Medicine

## 2015-08-25 MED ORDER — LORATADINE 10 MG PO TABS: 10.0000 mg | ORAL_TABLET | Freq: Every day | ORAL | Status: DC

## 2015-08-25 MED ORDER — AMLODIPINE BESYLATE 10 MG PO TABS: 10.0000 mg | ORAL_TABLET | Freq: Every day | ORAL | Status: DC

## 2015-08-25 MED ORDER — FAMOTIDINE 20 MG PO TABS: 20.0000 mg | ORAL_TABLET | Freq: Every day | ORAL | Status: DC

## 2015-08-25 MED ORDER — ALLOPURINOL 300 MG PO TABS
300.0000 mg | ORAL_TABLET | Freq: Every day | ORAL | Status: DC
Start: 2015-08-25 — End: 2016-02-03

## 2015-08-25 NOTE — Telephone Encounter (Signed)
Scheduled patient tomorrow at 1:30 with Marcos Eke for hypertension.

## 2015-08-25 NOTE — Telephone Encounter (Signed)
Patty with [facility] called to report patient's blood pressure at 186/114, with a pulse of 105. He took his bp medication an hour ago. Please advise if anything needs to be done about his blood pressure.

## 2015-08-25 NOTE — Telephone Encounter (Signed)
Please disregard previous message. Appt has been scheduled for tomorrow.

## 2015-08-26 ENCOUNTER — Ambulatory Visit: Payer: 59 | Admitting: Family

## 2015-09-08 ENCOUNTER — Ambulatory Visit (HOSPITAL_COMMUNITY): Payer: 59

## 2015-09-09 ENCOUNTER — Ambulatory Visit (HOSPITAL_COMMUNITY): Payer: 59

## 2015-09-16 ENCOUNTER — Other Ambulatory Visit: Payer: Self-pay | Admitting: Acute Care

## 2015-09-16 ENCOUNTER — Ambulatory Visit (HOSPITAL_COMMUNITY)
Admission: RE | Admit: 2015-09-16 | Discharge: 2015-09-16 | Disposition: A | Discharge status: Home / Self Care | Payer: 59 | Source: Ambulatory Visit | Attending: Acute Care | Admitting: Acute Care

## 2015-09-16 DIAGNOSIS — R051 Acute cough: Secondary | ICD-10-CM | POA: Insufficient documentation

## 2015-09-16 DIAGNOSIS — R059 Cough, unspecified: Secondary | ICD-10-CM | POA: Insufficient documentation

## 2015-09-16 DIAGNOSIS — I10 Essential (primary) hypertension: Secondary | ICD-10-CM | POA: Insufficient documentation

## 2015-09-16 DIAGNOSIS — J309 Allergic rhinitis, unspecified: Secondary | ICD-10-CM | POA: Insufficient documentation

## 2015-09-16 DIAGNOSIS — Z430 Encounter for attention to tracheostomy: Secondary | ICD-10-CM | POA: Diagnosis not present

## 2015-09-16 DIAGNOSIS — Z881 Allergy status to other antibiotic agents status: Secondary | ICD-10-CM | POA: Insufficient documentation

## 2015-09-16 DIAGNOSIS — I519 Heart disease, unspecified: Secondary | ICD-10-CM | POA: Insufficient documentation

## 2015-09-16 DIAGNOSIS — R05 Cough: Secondary | ICD-10-CM | POA: Diagnosis not present

## 2015-09-16 DIAGNOSIS — K219 Gastro-esophageal reflux disease without esophagitis: Secondary | ICD-10-CM | POA: Insufficient documentation

## 2015-09-16 DIAGNOSIS — G4733 Obstructive sleep apnea (adult) (pediatric): Secondary | ICD-10-CM | POA: Insufficient documentation

## 2015-09-16 DIAGNOSIS — L929 Granulomatous disorder of the skin and subcutaneous tissue, unspecified: Secondary | ICD-10-CM

## 2015-09-16 DIAGNOSIS — E119 Type 2 diabetes mellitus without complications: Secondary | ICD-10-CM | POA: Diagnosis not present

## 2015-09-16 DIAGNOSIS — Z930 Tracheostomy status: Secondary | ICD-10-CM | POA: Diagnosis not present

## 2015-09-16 DIAGNOSIS — J3089 Other allergic rhinitis: Secondary | ICD-10-CM

## 2015-09-16 MED ORDER — BETAMETHASONE VALERATE 0.1 % EX OINT: 1.0000 "application " | TOPICAL_OINTMENT | Freq: Two times a day (BID) | CUTANEOUS | Status: DC

## 2015-09-16 MED ORDER — FLUTICASONE PROPIONATE 50 MCG/ACT NA SUSP: 2.0000 | Freq: Every day | NASAL | Status: DC

## 2015-09-16 NOTE — Progress Notes (Signed)
Tracheostomy Procedure Note  COALTON ARCH [id number] [birth year]  Pre Procedure Tracheostomy Information  Trach Brand: Shiley Size: 5.0 XLT Proximal Style: Uncuffed Secured by: Velcro   Procedure: Pt. Came in for trach change. All vitals good, BP 187/102, Sat 95%, Pulse 103, RR 16    Post Procedure Tracheostomy Information  Trach Brand: Shiley Size: 5.0 XLT Proximal uncuffed Style: Uncuffed Secured by: Velcro   Post Procedure Evaluation:  ETCO2 positive color change from yellow to purple : Yes.   Vital signs:blood pressure 184/121, pulse 94 respirations 16 and pulse oximetry 94 % Patients current condition: stable Complications: No apparent complications Trach site exam: clean, dry Wound care done: 4 x 4 gauze Patient did tolerate procedure well.   Education: none  Prescription needs: none    Additional needs: Pt. To return to clinic in 3 months for follow up, also given 2 trach cleaning kits, an 4 5XLT inner cannulas.

## 2015-09-16 NOTE — Progress Notes (Signed)
CC:  Trach clinic initial visit  Brief History   HPI :35 y.o. male with history of DM type 2, Morbid obesity, OSA, OSV who was admitted on 06/12/15 with hypercarbic respiratory failure and placed on BIPAP for support. BLE dopplers negative for DVT and unable to do chest CT due to body habitus. He with developed hypoxemia with MS changes requiring intubation on 11/13. He was noted to have pulmonary edema secondary to CHF due to morbid obesity and was treated with diuresis. 2 D echo done revealing moderate concentric hypertrophy with EF 55-60%, grade 2 diastolic dysfunction and no wall abnormality. He had difficulty with vent wean ultimately underwent tracheostomy by Dr. Suszanne Conners on 11/22. He has since been d/c'd to home w/ chronic trach and presents today to get established in trach clinic.    Past Medical History  Diagnosis Date  . Hypertension   . Reflux   . Diabetes mellitus without complication (HCC)   . Acute on chronic diastolic CHF (congestive heart failure), NYHA class 1 (HCC)   . GERD (gastroesophageal reflux disease)    Family History  Problem Relation Age of Onset  . Diabetes Maternal Grandmother   . Hypertension Maternal Grandmother    Allergies  Allergen Reactions  . Vancomycin Other (See Comments)  . Zosyn [Piperacillin Sod-Tazobactam So] Other (See Comments)   Social History   Social History  . Marital Status: Married    Spouse Name: N/A  . Number of Children: N/A  . Years of Education: N/A   Occupational History  . Not on file.   Social History Main Topics  . Smoking status: Never Smoker   . Smokeless tobacco: Never Used  . Alcohol Use: No  . Drug Use: No  . Sexual Activity: Not on file   Other Topics Concern  . Not on file   Social History Narrative   @  Review of Systems:   Bolds are positive  Constitutional: weight loss; intentional, gain, night sweats, Fevers, chills, fatigue .  HEENT: headaches, Sore throat, sneezing, nasal  congestion, post nasal drip, Difficulty swallowing, Tooth/dental problems, visual complaints visual changes, ear ache CV:  chest pain, radiates: ,Orthopnea, PND, swelling in lower extremities, dizziness, palpitations, syncope.  GI  heartburn, indigestion, abdominal pain, nausea, vomiting, diarrhea, change in bowel habits, loss of appetite, bloody stools.  Resp: cough PND, productive: , hemoptysis, dyspnea, chest pain, pleuritic.  Skin: rash or itching or icterus GU: dysuria, change in color of urine, urgency or frequency. flank pain, hematuria  MS: joint pain or swelling. decreased range of motion  Psych: change in mood or affect. depression or anxiety.  Neuro: difficulty with speech, weakness, numbness, ataxia   Exam  BP 187/102, Sat 95%, Pulse 103, RR 16   General appearance: 35 Year old male, in no distress Mouth:  membranes and no mucosal ulcerations; normal hard and soft palate Neck: Trachea midline, # 5 xlt prox trach, uncuffed trach stoma w granulation tissue on superior stoma from approx 10 to 3 oclock..  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   Discussion Mozes is doing well w/ the home trach. His only complaint is a nocturnal cough which seems most likely c/w nasal gtt and he also reported raised tissue around his trach stoma which is granulation tissue. We changed his trach out today. Kept same size  and model.   Plan -re trach: will see him again in 3 months. We discussed at length trach and weight loss as well as need to pass PSG prior to decannulation. I told him I would be happy to refer him to one of my colleagues after he has lost about 100 lbs then we could see about a PSG and titration study.  -re: granulation tissue: will trial 2 week course of topical steroid.. I will call him at home to check on progress here in a couple weeks -Re cough: will add nasal steroid.   [name] ACNP-BC [facility] Pager # [phone] OR # [phone] if no answer

## 2015-09-27 ENCOUNTER — Other Ambulatory Visit: Payer: Self-pay | Admitting: Acute Care

## 2015-09-27 ENCOUNTER — Telehealth: Payer: Self-pay | Admitting: Acute Care

## 2015-09-27 DIAGNOSIS — G4733 Obstructive sleep apnea (adult) (pediatric): Secondary | ICD-10-CM

## 2015-09-27 DIAGNOSIS — I272 Pulmonary hypertension, unspecified: Secondary | ICD-10-CM

## 2015-09-27 DIAGNOSIS — Z930 Tracheostomy status: Secondary | ICD-10-CM

## 2015-09-27 MED ORDER — TRACHEOSTOMY CARE KIT: 30.0000 | PACK | Freq: Every day | Status: DC

## 2015-10-29 ENCOUNTER — Telehealth: Payer: Self-pay | Admitting: Internal Medicine

## 2015-10-29 NOTE — Telephone Encounter (Signed)
Would like to know if FMLA forms were received by fax on 3/28

## 2015-10-29 NOTE — Telephone Encounter (Signed)
I have not received any paperwork for this patient.  

## 2015-11-01 ENCOUNTER — Other Ambulatory Visit (HOSPITAL_COMMUNITY): Payer: Self-pay | Admitting: General Surgery

## 2015-11-01 NOTE — Telephone Encounter (Signed)
Patient is going to have form faxed to us again. He said his legs are still swelling. He said he is elevating them at night and taking his fluid pill. I told him to elevate them during the day when he can and to call us if he has a weight gain of 3 or 4 pounds in a day. He has an appt on 11/08/15. If it gets worse he will call us. He may want to discuss compression stockings at his next office visit.

## 2015-11-04 ENCOUNTER — Ambulatory Visit (HOSPITAL_COMMUNITY)
Admission: RE | Admit: 2015-11-04 | Discharge: 2015-11-04 | Disposition: A | Discharge status: Home / Self Care | Payer: 59 | Source: Ambulatory Visit | Attending: General Surgery | Admitting: General Surgery

## 2015-11-04 ENCOUNTER — Other Ambulatory Visit: Payer: Self-pay

## 2015-11-04 DIAGNOSIS — J9811 Atelectasis: Secondary | ICD-10-CM | POA: Insufficient documentation

## 2015-11-08 ENCOUNTER — Ambulatory Visit: Payer: 59 | Admitting: Internal Medicine

## 2015-11-08 DIAGNOSIS — Z0289 Encounter for other administrative examinations: Secondary | ICD-10-CM

## 2015-11-15 ENCOUNTER — Ambulatory Visit: Payer: 59 | Admitting: Dietician

## 2015-11-16 ENCOUNTER — Ambulatory Visit: Payer: 59 | Admitting: Dietician

## 2015-11-17 ENCOUNTER — Ambulatory Visit: Payer: 59 | Admitting: Internal Medicine

## 2015-11-24 ENCOUNTER — Encounter: Payer: Self-pay | Admitting: Internal Medicine

## 2015-11-24 ENCOUNTER — Other Ambulatory Visit (INDEPENDENT_AMBULATORY_CARE_PROVIDER_SITE_OTHER): Payer: BLUE CROSS/BLUE SHIELD

## 2015-11-24 ENCOUNTER — Ambulatory Visit (INDEPENDENT_AMBULATORY_CARE_PROVIDER_SITE_OTHER): Payer: BLUE CROSS/BLUE SHIELD | Admitting: Internal Medicine

## 2015-11-24 VITALS — BP 144/86 | HR 92 | Temp 98.2°F | Resp 18 | Ht 69.0 in | Wt >= 6400 oz

## 2015-11-24 DIAGNOSIS — I5032 Chronic diastolic (congestive) heart failure: Secondary | ICD-10-CM | POA: Diagnosis not present

## 2015-11-24 DIAGNOSIS — E118 Type 2 diabetes mellitus with unspecified complications: Secondary | ICD-10-CM

## 2015-11-24 DIAGNOSIS — I10 Essential (primary) hypertension: Secondary | ICD-10-CM | POA: Diagnosis not present

## 2015-11-24 DIAGNOSIS — Z23 Encounter for immunization: Secondary | ICD-10-CM

## 2015-11-24 LAB — CBC
HCT: 37.7 % — ABNORMAL LOW (ref 39.0–52.0)
Hemoglobin: 12.4 g/dL — ABNORMAL LOW (ref 13.0–17.0)
MCHC: 32.7 g/dL (ref 30.0–36.0)
MCV: 87.9 fl (ref 78.0–100.0)
Platelets: 216 10*3/uL (ref 150.0–400.0)
RBC: 4.29 Mil/uL (ref 4.22–5.81)
RDW: 14.3 % (ref 11.5–15.5)
WBC: 9.6 10*3/uL (ref 4.0–10.5)

## 2015-11-24 LAB — HEMOGLOBIN A1C: HEMOGLOBIN A1C: 8.6 % — AB (ref 4.6–6.5)

## 2015-11-24 LAB — LIPID PANEL
CHOL/HDL RATIO: 3
Cholesterol: 154 mg/dL (ref 0–200)
HDL: 54.8 mg/dL (ref >39.00)
LDL CALC: 83 mg/dL (ref 0–99)
NONHDL: 99.11
Triglycerides: 82 mg/dL (ref 0.0–149.0)
VLDL: 16.4 mg/dL (ref 0.0–40.0)

## 2015-11-24 LAB — VITAMIN B12: VITAMIN B 12: 376 pg/mL (ref 211–911)

## 2015-11-24 LAB — FOLATE: Folate: 10.4 ng/mL (ref >5.9)

## 2015-11-24 LAB — COMPREHENSIVE METABOLIC PANEL
ALK PHOS: 144 U/L — AB (ref 39–117)
ALT: 28 U/L (ref 0–53)
AST: 23 U/L (ref 0–37)
Albumin: 3.9 g/dL (ref 3.5–5.2)
BUN: 11 mg/dL (ref 6–23)
CHLORIDE: 93 meq/L — AB (ref 96–112)
CO2: 40 meq/L — AB (ref 19–32)
Calcium: 9.2 mg/dL (ref 8.4–10.5)
Creatinine, Ser: 0.79 mg/dL (ref 0.40–1.50)
GFR: 144.84 mL/min (ref >60.00)
GLUCOSE: 176 mg/dL — AB (ref 70–99)
POTASSIUM: 3.7 meq/L (ref 3.5–5.1)
Sodium: 140 mEq/L (ref 135–145)
Total Bilirubin: 0.7 mg/dL (ref 0.2–1.2)
Total Protein: 8 g/dL (ref 6.0–8.3)

## 2015-11-24 LAB — URIC ACID: Uric Acid, Serum: 8.9 mg/dL — ABNORMAL HIGH (ref 4.0–7.8)

## 2015-11-24 LAB — TSH: TSH: 1.44 u[IU]/mL (ref 0.35–4.50)

## 2015-11-24 LAB — BRAIN NATRIURETIC PEPTIDE: Pro B Natriuretic peptide (BNP): 25 pg/mL (ref 0.0–100.0)

## 2015-11-24 NOTE — Progress Notes (Signed)
Pre visit review using our clinic review tool, if applicable. No additional management support is needed unless otherwise documented below in the visit note. 

## 2015-11-24 NOTE — Patient Instructions (Signed)
We are checking the blood work today and given you the prescription for the compression stockings.   We will adjust the medicine for the diabetes and for the tingling.   Diabetes and Standards of Medical Care Diabetes is complicated. You may find that your diabetes team includes a dietitian, nurse, diabetes educator, eye doctor, and more. To help everyone know what is going on and to help you get the care you deserve, the following schedule of care was developed to help keep you on track. Below are the tests, exams, vaccines, medicines, education, and plans you will need. HbA1c test This test shows how well you have controlled your glucose over the past 2-3 months. It is used to see if your diabetes management plan needs to be adjusted.   It is performed at least 2 times a year if you are meeting treatment goals.  It is performed 4 times a year if therapy has changed or if you are not meeting treatment goals. Blood pressure test  This test is performed at every routine medical visit. The goal is less than 140/90 mm Hg for most people, but 130/80 mm Hg in some cases. Ask your health care provider about your goal. Dental exam  Follow up with the dentist regularly. Eye exam  If you are diagnosed with type 1 diabetes as a child, get an exam upon reaching the age of 34 years or older and having had diabetes for 3-5 years. Yearly eye exams are recommended after that initial eye exam.  If you are diagnosed with type 1 diabetes as an adult, get an exam within 5 years of diagnosis and then yearly.  If you are diagnosed with type 2 diabetes, get an exam as soon as possible after the diagnosis and then yearly. Foot care exam  Visual foot exams are performed at every routine medical visit. The exams check for cuts, injuries, or other problems with the feet.  You should have a complete foot exam performed every year. This exam includes an inspection of the structure and skin of your feet, a check of  the pulses in your feet, and a check of the sensation in your feet.  Type 1 diabetes: The first exam is performed 5 years after diagnosis.  Type 2 diabetes: The first exam is performed at the time of diagnosis.  Check your feet nightly for cuts, injuries, or other problems with your feet. Tell your health care provider if anything is not healing. Kidney function test (urine microalbumin)  This test is performed once a year.  Type 1 diabetes: The first test is performed 5 years after diagnosis.  Type 2 diabetes: The first test is performed at the time of diagnosis.  A serum creatinine and estimated glomerular filtration rate (eGFR) test is done once a year to assess the level of chronic kidney disease (CKD), if present. Lipid profile (cholesterol, HDL, LDL, triglycerides)  Performed every 5 years for most people.  The goal for LDL is less than 100 mg/dL. If you are at high risk, the goal is less than 70 mg/dL.  The goal for HDL is 40 mg/dL-50 mg/dL for men and 50 mg/dL-60 mg/dL for women. An HDL cholesterol of 60 mg/dL or higher gives some protection against heart disease.  The goal for triglycerides is less than 150 mg/dL. Immunizations  The flu (influenza) vaccine is recommended yearly for every person 63 months of age or older who has diabetes.  The pneumonia (pneumococcal) vaccine is recommended for every person  33 years of age or older who has diabetes. Adults 76 years of age or older may receive the pneumonia vaccine as a series of two separate shots.  The hepatitis B vaccine is recommended for adults shortly after they have been diagnosed with diabetes.  The Tdap (tetanus, diphtheria, and pertussis) vaccine should be given:  According to normal childhood vaccination schedules, for children.  Every 10 years, for adults who have diabetes. Diabetes self-management education  Education is recommended at diagnosis and ongoing as needed. Treatment plan  Your treatment plan is  reviewed at every medical visit.   This information is not intended to replace advice given to you by your health care provider. Make sure you discuss any questions you have with your health care provider.   Document Released: 05/14/2009 Document Revised: 08/07/2014 Document Reviewed: 12/17/2012 Elsevier Interactive Patient Education [employer].

## 2015-11-25 ENCOUNTER — Telehealth: Payer: Self-pay | Admitting: Internal Medicine

## 2015-11-25 NOTE — Telephone Encounter (Signed)
Please follow up on lab results

## 2015-11-26 NOTE — Telephone Encounter (Signed)
Patient called to check on his lab results. He said his neuropathy is really keeping him up at night. I let him know we have not forgotten about him and you would look at his labs when you could.

## 2015-11-27 MED ORDER — GABAPENTIN 300 MG PO CAPS: 300.0000 mg | ORAL_CAPSULE | Freq: Two times a day (BID) | ORAL | Status: DC | PRN

## 2015-11-27 MED ORDER — CANAGLIFLOZIN 300 MG PO TABS: 300.0000 mg | ORAL_TABLET | Freq: Every day | ORAL | Status: DC

## 2015-11-27 NOTE — Assessment & Plan Note (Signed)
Rx for compression stockings and checking BNP for signs of fluid overload and adjust lasix as needed.

## 2015-11-27 NOTE — Progress Notes (Signed)
   Subjective:    Patient ID: Derrick [name], male    DOB: Feb 10, 1981, 35 y.o.   MRN: [id number]  HPI The patient is a 35 YO man coming in for follow up of his diabetes. He is having some new numbness in his legs that goes to midshin and is present much of the time. He is also having some stomach problems from the diabetes and wants to switch medicines. He is not able to leave the house due to worrying about that. He is still taking the medicine now.   Review of Systems  Constitutional: Positive for activity change. Negative for fever, chills, appetite change and fatigue.  HENT: Negative.   Eyes: Negative.   Respiratory: Negative for cough, chest tightness, shortness of breath and wheezing.   Cardiovascular: Positive for leg swelling. Negative for chest pain and palpitations.  Gastrointestinal: Positive for diarrhea. Negative for nausea, vomiting, abdominal pain, constipation, blood in stool and abdominal distention.  Musculoskeletal: Positive for arthralgias. Negative for myalgias, back pain and gait problem.  Skin: Negative for color change, pallor and rash.  Neurological: Negative for dizziness, weakness, light-headedness and headaches.  Psychiatric/Behavioral: Negative.       Objective:   Physical Exam  Constitutional: He is oriented to person, place, and time. He appears well-developed and well-nourished.  Morbidly obese  HENT:  Head: Normocephalic and atraumatic.  Eyes: EOM are normal.  Neck: Normal range of motion.  Does not wish to show his trach site but states clean and dry  Cardiovascular: Normal rate and regular rhythm.   Pulmonary/Chest: Effort normal and breath sounds normal. No respiratory distress. He has no wheezes. He has no rales.  Abdominal: Soft. Bowel sounds are normal. He exhibits no distension. There is no tenderness. There is no rebound.  Musculoskeletal:  1+ bilateral edema  Neurological: He is alert and oriented to person, place, and time.  Coordination normal.  Skin: Skin is warm and dry.  Psychiatric: He has a normal mood and affect.   Filed Vitals:   11/24/15 1137 11/24/15 1355  BP: 152/90 144/86  Pulse: 92   Temp: 98.2 F (36.8 C)   TempSrc: Oral   Resp: 18   Height: 5\' 9"  (1.753 m)   Weight: 429 lb 1.9 oz (194.648 kg)   SpO2: 94%       Assessment & Plan:  Tdap and pneumonia 23 given at visit.

## 2015-11-27 NOTE — Assessment & Plan Note (Signed)
BP close to goal with amlodipine and lasix and metoprolol. Next medicine to add for BP would be ACE-I for concurrent diabetes.

## 2015-11-27 NOTE — Assessment & Plan Note (Signed)
He is not always taking his metformin due to complications. He wishes to switch. Checking HgA1c, diet is not great overall. Complicated by new neuropathy in his legs. Will start gabapentin bid prn for the pain. Adjust regimen as needed.

## 2015-11-29 ENCOUNTER — Other Ambulatory Visit: Payer: Self-pay | Admitting: Internal Medicine

## 2015-11-29 NOTE — Telephone Encounter (Signed)
Tried to reach patient, no answer. Will try again later.

## 2015-11-29 NOTE — Telephone Encounter (Signed)
Tried to reach patient, no answer. I left a message.

## 2015-11-29 NOTE — Telephone Encounter (Signed)
Patient called back about labs. I informed him of his labs and he has new RX. But he had a few other questions and wanted Amy to call him when he could. Please follow up. Thank you.

## 2015-11-29 NOTE — Telephone Encounter (Signed)
Please see result note 

## 2015-12-06 ENCOUNTER — Ambulatory Visit: Payer: 59 | Admitting: Dietician

## 2015-12-13 ENCOUNTER — Other Ambulatory Visit: Payer: Self-pay | Admitting: Internal Medicine

## 2015-12-15 ENCOUNTER — Ambulatory Visit: Payer: BLUE CROSS/BLUE SHIELD | Admitting: Medical

## 2015-12-15 ENCOUNTER — Ambulatory Visit (INDEPENDENT_AMBULATORY_CARE_PROVIDER_SITE_OTHER): Payer: BLUE CROSS/BLUE SHIELD | Admitting: Medical

## 2015-12-15 ENCOUNTER — Ambulatory Visit (HOSPITAL_BASED_OUTPATIENT_CLINIC_OR_DEPARTMENT_OTHER)
Admission: RE | Admit: 2015-12-15 | Discharge: 2015-12-15 | Disposition: A | Discharge status: Home / Self Care | Payer: BLUE CROSS/BLUE SHIELD | Source: Ambulatory Visit | Attending: Medical | Admitting: Medical

## 2015-12-15 ENCOUNTER — Encounter: Payer: Self-pay | Admitting: Medical

## 2015-12-15 VITALS — BP 128/82 | HR 89 | Temp 98.1°F | Ht 70.0 in | Wt >= 6400 oz

## 2015-12-15 DIAGNOSIS — J209 Acute bronchitis, unspecified: Secondary | ICD-10-CM | POA: Diagnosis not present

## 2015-12-15 DIAGNOSIS — Z8679 Personal history of other diseases of the circulatory system: Secondary | ICD-10-CM | POA: Diagnosis not present

## 2015-12-15 DIAGNOSIS — R062 Wheezing: Secondary | ICD-10-CM

## 2015-12-15 MED ORDER — FLUTICASONE PROPIONATE HFA 110 MCG/ACT IN AERO: 2.0000 | INHALATION_SPRAY | Freq: Two times a day (BID) | RESPIRATORY_TRACT | Status: DC

## 2015-12-15 MED ORDER — DOXYCYCLINE HYCLATE 100 MG PO TABS: 100.0000 mg | ORAL_TABLET | Freq: Two times a day (BID) | ORAL | Status: DC

## 2015-12-15 MED ORDER — BENZONATATE 100 MG PO CAPS: 100.0000 mg | ORAL_CAPSULE | Freq: Three times a day (TID) | ORAL | Status: DC | PRN

## 2015-12-15 MED ORDER — ALBUTEROL SULFATE HFA 108 (90 BASE) MCG/ACT IN AERS
2.0000 | INHALATION_SPRAY | Freq: Four times a day (QID) | RESPIRATORY_TRACT | Status: DC | PRN
Start: 2015-12-15 — End: 2016-03-06

## 2015-12-15 NOTE — Progress Notes (Signed)
Subjective:    Patient ID: Derrick [name], male    DOB: [birth year], 35 y.o.   MRN: [id number]  HPI   Pt in states he has been feeling sick for about a week.   Pt desrcibes  feeling some chest congestion and bringing up mucous when coughs.   Pt not sure if he runs a fever. No chills. Sweats a lot usually.  Pt states June 11, 2015 he was admitted to hospital per pt. Admission/ed eval for hypoxia.  Pt feels nasal congestion over past week.   On 08-11-2015 note states chf class 1 NYHA. Pt does not weight himself. Pt is on lasix 3 times day.   Pt has thinks mild wheezing 2 week while lifting some boxes. But not sustained.       Review of Systems  Constitutional: Negative for fever, chills and diaphoresis.  HENT: Negative for congestion, ear discharge, ear pain, nosebleeds, postnasal drip and rhinorrhea.   Respiratory: Positive for cough and wheezing. Negative for shortness of breath.   Gastrointestinal: Negative for abdominal pain.  Musculoskeletal: Negative for back pain.  Neurological: Negative for dizziness, syncope, weakness, light-headedness and numbness.  Hematological: Negative for adenopathy. Does not bruise/bleed easily.   Past Medical History  Diagnosis Date  . Hypertension   . Reflux   . Diabetes mellitus without complication ([place])   . Acute on chronic diastolic CHF (congestive heart failure), NYHA class 1 ([place])   . GERD (gastroesophageal reflux disease)      Social History   Social History  . Marital Status: Married    Spouse Name: N/A  . Number of Children: N/A  . Years of Education: N/A   Occupational History  . Not on file.   Social History Main Topics  . Smoking status: Never Smoker   . Smokeless tobacco: Never Used  . Alcohol Use: No  . Drug Use: No  . Sexual Activity: Not on file   Other Topics Concern  . Not on file   Social History Narrative    Past Surgical History  Procedure Laterality Date  . Tracheostomy tube  placement N/A 06/21/2015    Procedure: TRACHEOSTOMY;  Surgeon: Leta Baptist, MD;  Location: [facility] OR;  Service: ENT;  Laterality: N/A;    Family History  Problem Relation Age of Onset  . Diabetes Maternal Grandmother   . Hypertension Maternal Grandmother     Allergies  Allergen Reactions  . Vancomycin Other (See Comments)  . Zosyn [Piperacillin Sod-Tazobactam So] Other (See Comments)    Current Outpatient Prescriptions on File Prior to Visit  Medication Sig Dispense Refill  . allopurinol (ZYLOPRIM) 300 MG tablet Take 1 tablet (300 mg total) by mouth daily. 30 tablet 3  . amLODipine (NORVASC) 10 MG tablet Take 1 tablet (10 mg total) by mouth daily. 30 tablet 3  . canagliflozin (INVOKANA) 300 MG TABS tablet Take 1 tablet (300 mg total) by mouth daily before breakfast. 30 tablet 6  . famotidine (PEPCID) 20 MG tablet Take 1 tablet (20 mg total) by mouth daily. 30 tablet 3  . fluticasone (FLONASE) 50 MCG/ACT nasal spray Place 2 sprays into both nostrils daily. 16 g 6  . furosemide (LASIX) 40 MG tablet TAKE ONE & ONE-HALF TABLETS BY MOUTH THREE TIMES DAILY 120 tablet 0  . gabapentin (NEURONTIN) 300 MG capsule Take 1 capsule (300 mg total) by mouth 2 (two) times daily as needed. 60 capsule 3  . loratadine (CLARITIN) 10 MG tablet Take 1 tablet (10 mg total)  by mouth daily. 30 tablet 3  . metFORMIN (GLUCOPHAGE) 1000 MG tablet Take 1 tablet (1,000 mg total) by mouth 2 (two) times daily with a meal. 60 tablet 3  . metoprolol (LOPRESSOR) 50 MG tablet Take 1 tablet (50 mg total) by mouth 2 (two) times daily. 60 tablet 3  . nitroGLYCERIN (NITROSTAT) 0.4 MG SL tablet Place 0.4 mg under the tongue every 5 (five) minutes as needed for chest pain.    . potassium chloride SA (K-DUR,KLOR-CON) 20 MEQ tablet Take 1 tablet (20 mEq total) by mouth 2 (two) times daily. 60 tablet 3  . scopolamine (TRANSDERM-SCOP) 1 MG/3DAYS Place 1 patch onto the skin every 3 (three) days. Reported on 11/24/2015    . Tracheostomy Care  KIT 30 kits by Does not apply route daily. 30 each 6   No current facility-administered medications on file prior to visit.    BP 128/82 mmHg  Pulse 89  Temp(Src) 98.1 F (36.7 C) (Oral)  Ht _0  (1.778 m)  Wt 419 lb 9.6 oz (190.329 kg)  BMI 60.21 kg/m2  SpO2 96%       Objective:   Physical Exam  General  Mental Status - Alert. General Appearance - Well groomed. Not in acute distress.  Skin Rashes- No Rashes.  HEENT Head- Normal. Ear Auditory Canal - Left- Normal. Right - Normal.Tympanic Membrane- Left- Normal. Right- Normal. Eye Sclera/Conjunctiva- Left- Normal. Right- Normal. Nose & Sinuses Nasal Mucosa- Left-  Boggy and Congested. Right-  Boggy and  Congested.Bilateral maxillary and frontal sinus pressure. Mouth & Throat Lips: Upper Lip- Normal: no dryness, cracking, pallor, cyanosis, or vesicular eruption. Lower Lip-Normal: no dryness, cracking, pallor, cyanosis or vesicular eruption. Buccal Mucosa- Bilateral- No Aphthous ulcers. Oropharynx- No Discharge or Erythema. Tonsils: Characteristics- Bilateral- No Erythema or Congestion. Size/Enlargement- Bilateral- No enlargement. Discharge- bilateral-None.  Neck Neck- Supple. No Masses.   Chest and Lung Exam Auscultation: Breath Sounds:-Clear even and unlabored.  Cardiovascular Auscultation:Rythm- Regular, rate and rhythm. Murmurs & Other Heart Sounds:Ausculatation of the heart reveal- No Murmurs.  Lymphatic Head & Neck General Head & Neck Lymphatics: Bilateral: Description- No Localized lymphadenopathy.  Lower ext- large calfs bilaterally. No pedal edema.       Assessment & Plan:  You have some symptoms of bronchitis today. Will rx doxycycline antibiotic and benzonatate cough medication.  For wheezing and history of low oxygen  will rx flovent steroid inhaler and albuterol inhaler.  For hx of chf will repeat cmp, bnp and cxr. Will get cxr today but tomorrow morning we will get cmp and bnp tomorrow  stat.(lab closed at time of exam)  We will follow your cxr and labs. If you worsen at any point in time then would advise ED evaluaion.  Follow up 2-5 days or as needed

## 2015-12-15 NOTE — Patient Instructions (Addendum)
You have some symptoms of bronchitis today. Will rx doxycycline antibiotic and benzonatate cough medication.  For wheezing and history of low oxygen  will rx flovent steroid inhaler and albuterol inhaler.  For hx of chf will repeat cmp, bnp and cxr. Will get cxr today but tomorrow morning we will get cmp and bnp tomorrow stat.   We will follow your  cxr and labs. If you worsen at any point in time then would advise ED evaluaion.  Follow up 2-5 days or as needed

## 2015-12-15 NOTE — Progress Notes (Signed)
Pre visit review using our clinic review tool, if applicable. No additional management support is needed unless otherwise documented below in the visit note. 

## 2015-12-16 ENCOUNTER — Other Ambulatory Visit (INDEPENDENT_AMBULATORY_CARE_PROVIDER_SITE_OTHER): Payer: BLUE CROSS/BLUE SHIELD

## 2015-12-16 DIAGNOSIS — Z8679 Personal history of other diseases of the circulatory system: Secondary | ICD-10-CM | POA: Diagnosis not present

## 2015-12-16 LAB — COMPREHENSIVE METABOLIC PANEL
ALBUMIN: 3.8 g/dL (ref 3.6–5.1)
ALK PHOS: 158 U/L — AB (ref 40–115)
ALT: 33 U/L (ref 9–46)
AST: 26 U/L (ref 10–40)
BILIRUBIN TOTAL: 0.5 mg/dL (ref 0.2–1.2)
BUN: 14 mg/dL (ref 7–25)
CALCIUM: 8.8 mg/dL (ref 8.6–10.3)
CO2: 36 mmol/L — AB (ref 20–31)
CREATININE: 0.87 mg/dL (ref 0.60–1.35)
Chloride: 90 mmol/L — ABNORMAL LOW (ref 98–110)
Glucose, Bld: 223 mg/dL — ABNORMAL HIGH (ref 65–99)
Potassium: 3.3 mmol/L — ABNORMAL LOW (ref 3.5–5.3)
SODIUM: 139 mmol/L (ref 135–146)
TOTAL PROTEIN: 7.5 g/dL (ref 6.1–8.1)

## 2015-12-16 LAB — BRAIN NATRIURETIC PEPTIDE: Brain Natriuretic Peptide: 8.6 pg/mL (ref <100)

## 2015-12-29 ENCOUNTER — Ambulatory Visit (HOSPITAL_COMMUNITY)
Admission: RE | Admit: 2015-12-29 | Discharge: 2015-12-29 | Disposition: A | Discharge status: Home / Self Care | Payer: BLUE CROSS/BLUE SHIELD | Source: Ambulatory Visit | Attending: Acute Care | Admitting: Acute Care

## 2015-12-29 DIAGNOSIS — E119 Type 2 diabetes mellitus without complications: Secondary | ICD-10-CM | POA: Diagnosis not present

## 2015-12-29 DIAGNOSIS — Z430 Encounter for attention to tracheostomy: Secondary | ICD-10-CM | POA: Insufficient documentation

## 2015-12-29 DIAGNOSIS — G4733 Obstructive sleep apnea (adult) (pediatric): Secondary | ICD-10-CM | POA: Diagnosis not present

## 2015-12-29 DIAGNOSIS — Z930 Tracheostomy status: Secondary | ICD-10-CM | POA: Diagnosis not present

## 2015-12-29 NOTE — Progress Notes (Signed)
Tracheostomy Procedure Note  Derrick HansenReginald A [name] [id number] 03-30-1981  Pre Procedure Tracheostomy Information  Trach Brand: Shiley Size: 5.0 xlt Style: Proximal Secured by: Velcro   Procedure: trach change    Post Procedure Tracheostomy Information  Trach Brand: Shiley Size: 5.0 xlt Style: Proximal Secured by: Velcro   Post Procedure Evaluation:  ETCO2 positive color change from yellow to purple : Yes.   Vital signs:blood pressure 125/74, pulse 104, respirations 20 and pulse oximetry 97 % Patients current condition: stable Complications: No apparent complications Trach site exam: clean, dry Wound care done: dry Patient did tolerate procedure well.   Education: none  Prescription needs: New passey muir valve    Additional needs:

## 2015-12-29 NOTE — Progress Notes (Signed)
    Patient ID: Derrick HansenReginald A [name], male    DOB: February 08, 1981, 35 y.o.   MRN: [id number]  Subjective:  Routine trach clinic follow up HPI  HPI :35 y.o. male with history of DM type 2, Morbid obesity, OSA, OSV who was admitted on 06/12/15 with hypercarbic respiratory failure and placed on BIPAP for support. BLE dopplers negative for DVT and unable to do chest CT due to body habitus. He with developed hypoxemia with MS changes requiring intubation on 11/13. He was noted to have pulmonary edema secondary to CHF due to morbid obesity and was treated with diuresis. 2 D echo done revealing moderate concentric hypertrophy with EF 55-60%, grade 2 diastolic dysfunction and no wall abnormality. He had difficulty with vent wean ultimately underwent tracheostomy by Dr. Suszanne Connerseoh on 11/22. He has since been d/c'd to home w/ chronic trach and presents today for routine f/u. He looks excellent. Has lost 75 lbs! Feels well. Is currently seeing a nutritionist and working on getting set up for gastric bypass surgery.   Review of Systems  Constitutional: Negative.   HENT: Negative.   Eyes: Negative.   Respiratory: Negative.   Cardiovascular: Positive for leg swelling.  Endocrine: Negative.   Genitourinary: Negative.   Musculoskeletal: Negative.   Skin: Negative.   Allergic/Immunologic: Negative.   Neurological: Negative.   Hematological: Negative.   Psychiatric/Behavioral: Negative.    125/74, pulse 104, respirations 20 and pulse oximetry 97 %    Objective:   Physical Exam  Constitutional: He is oriented to person, place, and time. He appears well-developed. No distress.  HENT:  Head: Normocephalic and atraumatic.  Mouth/Throat: No oropharyngeal exudate.  Eyes: Conjunctivae and EOM are normal. Pupils are equal, round, and reactive to light.  Neck: Normal range of motion.  Trach unremarkable   Cardiovascular: Normal rate and normal heart sounds.   Pulmonary/Chest: Effort normal and breath sounds normal.  No respiratory distress. He has no wheezes. He has no rales.  Abdominal: Soft. Bowel sounds are normal. He exhibits no distension. There is no tenderness. There is no rebound.  Musculoskeletal: Normal range of motion. He exhibits edema.  Neurological: He is alert and oriented to person, place, and time.  Skin: Skin is warm. He is not diaphoretic. No erythema.  Psychiatric: He has a normal mood and affect. His behavior is normal. Thought content normal.    Trach changed w/out difficulty.       Assessment & Plan:   Tracheostomy dependence in setting of severe OSA Plan Cont routine trach care ROV 3 months Eventually he will undergo gastric bypass surg After he has had sig wt loss we will need to get him set back up for PSG   Simonne MartinetPeter E Nesha Counihan ACNP-BC [facility] Pager # [phone] OR # [phone] if no answer

## 2016-01-02 ENCOUNTER — Other Ambulatory Visit: Payer: Self-pay | Admitting: Medical

## 2016-01-03 NOTE — Telephone Encounter (Signed)
Called patient regarding refill on Doxycicline. States he just picked up att his empty bottles and sent them to be refilled. Asked if he neede hif Doxy refilled. Advised he would need to seek refill from his PCP. Dr. Okey Duprerawford. Patient agreed.

## 2016-01-03 NOTE — Telephone Encounter (Signed)
I saw pt more than 2 weeks ago. I already gave him a prescription. Pt pcp is Dr. Okey Duprerawford. Not sure why he is calling and asking for another rx doxy. But if he thinks he needs rx would need to be seen. It would appropiate for him to see pcp Dr. Okey Duprerawford. Arrange follow with pcp. He has complicated medical and respiratory history.

## 2016-01-05 ENCOUNTER — Ambulatory Visit: Payer: 59 | Admitting: Dietician

## 2016-01-11 ENCOUNTER — Encounter: Payer: BLUE CROSS/BLUE SHIELD | Attending: General Surgery | Admitting: Dietician

## 2016-01-11 ENCOUNTER — Encounter: Payer: Self-pay | Admitting: Dietician

## 2016-01-11 DIAGNOSIS — Z01818 Encounter for other preprocedural examination: Secondary | ICD-10-CM | POA: Diagnosis not present

## 2016-01-11 NOTE — Progress Notes (Signed)
  Pre-Op Assessment Visit:  Pre-Operative sleeve gastrectomy Surgery  Medical Nutrition Therapy:  Appt start time: 1605   End time:  1640.  Patient was seen on 01/11/2016 for Pre-Operative Nutrition Assessment. [facility] on 01/11/2016.   Preferred Learning Style:   No preference indicated   Learning Readiness:   Ready  Handouts given during visit include:  Pre-Op Goals Bariatric Surgery Protein Shakes   During the appointment today the following Pre-Op Goals were reviewed with the patient: Maintain or lose weight as instructed by your surgeon Make healthy food choices Begin to limit portion sizes Limited concentrated sugars and fried foods Keep fat/sugar in the single digits per serving on   food labels Practice CHEWING your food  (aim for 30 chews per bite or until applesauce consistency) Practice not drinking 15 minutes before, during, and 30 minutes after each meal/snack Avoid all carbonated beverages  Avoid/limit caffeinated beverages  Avoid all sugar-sweetened beverages Consume 3 meals per day; eat every 3-5 hours Make a list of non-food related activities Aim for 64-100 ounces of FLUID daily  Aim for at least 60-80 grams of PROTEIN daily Look for a liquid protein source that contain ?15 g protein and ?5 g carbohydrate  (ex: shakes, drinks, shots)  Patient-Centered Goals: Goals: lose weight so he can lose trach/improve sleep apnea, be able to play with son  7 confidence/10 importance scale   Demonstrated degree of understanding via:  Teach Back  Teaching Method Utilized:  Visual Auditory Hands on  Barriers to learning/adherence to lifestyle change: none  Patient to call the [facility] to enroll in Pre-Op and Post-Op Nutrition Education when surgery date is scheduled.

## 2016-01-11 NOTE — Patient Instructions (Signed)
Follow Pre-Op Goals Try Protein Shakes Call [facility] at [phone] when surgery is scheduled to enroll in Pre-Op Class  Things to remember:  Please always be honest with us. We want to support you!  If you have any questions or concerns in between appointments, please call or email Liz, Leslie, or Laurie.  The diet after surgery will be high protein and low in carbohydrate.  Vitamins and calcium need to be taken for the rest of your life.  Feel free to include support people in any classes or appointments.   Supplement recommendations:  Complete" Multivitamin: Sleeve Gastrectomy and RYGB patients take a double dose of MVI. LAGB patients take single dose as it is written on the package. Vitamin must be liquid or chewable but not gummy. Examples of these include Flintstones Complete and Centrum Complete. If the vitamin is bariatric-specific, take 1 dose as it is already formulated for bariatric surgery patients. Examples of these are Bariatric Advantage, Celebrate, and Wellesse. These can be found at the [facility] and/or online.     Calcium citrate: 1500 mg/day of Calcium citrate (also chewable or liquid) is recommended for all procedures. The body is only able to absorb 500-600 mg of Calcium at one time so 3 daily doses of 500 mg are recommended. Calcium doses must be taken a minimum of 2 hours apart. Additionally, Calcium must be taken 2 hours apart from iron-containing MVI. Examples of brands include Celebrate, Bariatric Advantage, and Wellesse. These brands must be purchased online or at the [facility]. Citracal Petites is the only Calcium citrate supplement found in general grocery stores and pharmacies. This is in tablet form and may be recommended for patients who do not tolerate chewable Calcium.  Continued or added Vitamin D supplementation based on individual needs.    Vitamin B12: 300-500 mcg/day for Sleeve Gastrectomy and RYGB. Optional for  LAGB patients as stomach remains fully intact. Must be taken intramuscularly, sublingually, or inhaled nasally. Oral route is not recommended. 

## 2016-01-12 ENCOUNTER — Telehealth: Payer: Self-pay | Admitting: *Deleted

## 2016-01-12 MED ORDER — GABAPENTIN 300 MG PO CAPS: 300.0000 mg | ORAL_CAPSULE | Freq: Three times a day (TID) | ORAL | Status: DC

## 2016-01-12 NOTE — Telephone Encounter (Signed)
Rec'd fax stating currently have script for Gabapentin 300 mg take 1 cap by mouth twice a day as needed. Requesting to get a new rx for TID # 90. Pt is consisently getting a week early and we can not fill early w/o directions change by MD.../lmb

## 2016-01-12 NOTE — Telephone Encounter (Signed)
Rec'd call pt states pharmacy will not fill his Gabapentin. Don't understand how they are saying its too soon to refill. Inform pt pharmacy fax MD requesting directions to be change to three times a day, and which she sent new rx should be able to get medication today...Raechel Chute/lmb

## 2016-01-12 NOTE — Telephone Encounter (Signed)
Sent in new rx 

## 2016-01-18 ENCOUNTER — Other Ambulatory Visit: Payer: Self-pay | Admitting: Internal Medicine

## 2016-01-28 ENCOUNTER — Ambulatory Visit: Payer: BLUE CROSS/BLUE SHIELD | Admitting: Dietician

## 2016-01-31 ENCOUNTER — Telehealth: Payer: Self-pay | Admitting: Emergency Medicine

## 2016-01-31 NOTE — Telephone Encounter (Signed)
Patient called and wanted you to call him back about the fluid in legs. Discuss last visit but still swelling

## 2016-01-31 NOTE — Telephone Encounter (Signed)
Needs acute visit to evaluate.

## 2016-01-31 NOTE — Telephone Encounter (Signed)
Patient said the fluid in his legs is not going down with his daily medication and it seems to be getting worse. His neuropathy in his feet and legs is still hurting as well. Patient is currently furosemide 40 mg 3 times daily for fluid. Patient wants to know what to do because the neuropathy is really bothering him.

## 2016-01-31 NOTE — Telephone Encounter (Signed)
Spoke to patient and scheduled an office visit. 

## 2016-02-03 ENCOUNTER — Encounter: Payer: BLUE CROSS/BLUE SHIELD | Attending: General Surgery | Admitting: Skilled Nursing Facility1

## 2016-02-03 ENCOUNTER — Encounter: Payer: Self-pay | Admitting: Internal Medicine

## 2016-02-03 ENCOUNTER — Ambulatory Visit (INDEPENDENT_AMBULATORY_CARE_PROVIDER_SITE_OTHER): Payer: BLUE CROSS/BLUE SHIELD | Admitting: Internal Medicine

## 2016-02-03 ENCOUNTER — Ambulatory Visit: Payer: BLUE CROSS/BLUE SHIELD | Admitting: Dietician

## 2016-02-03 ENCOUNTER — Encounter: Payer: Self-pay | Admitting: Skilled Nursing Facility1

## 2016-02-03 ENCOUNTER — Other Ambulatory Visit (INDEPENDENT_AMBULATORY_CARE_PROVIDER_SITE_OTHER): Payer: BLUE CROSS/BLUE SHIELD

## 2016-02-03 VITALS — BP 160/82 | HR 98 | Temp 98.0°F | Resp 22 | Ht 68.0 in | Wt >= 6400 oz

## 2016-02-03 DIAGNOSIS — IMO0002 Reserved for concepts with insufficient information to code with codable children: Secondary | ICD-10-CM

## 2016-02-03 DIAGNOSIS — B379 Candidiasis, unspecified: Secondary | ICD-10-CM | POA: Diagnosis not present

## 2016-02-03 DIAGNOSIS — E1142 Type 2 diabetes mellitus with diabetic polyneuropathy: Secondary | ICD-10-CM

## 2016-02-03 DIAGNOSIS — E118 Type 2 diabetes mellitus with unspecified complications: Secondary | ICD-10-CM | POA: Diagnosis not present

## 2016-02-03 DIAGNOSIS — E1165 Type 2 diabetes mellitus with hyperglycemia: Secondary | ICD-10-CM

## 2016-02-03 DIAGNOSIS — Z01818 Encounter for other preprocedural examination: Secondary | ICD-10-CM | POA: Diagnosis not present

## 2016-02-03 DIAGNOSIS — I5032 Chronic diastolic (congestive) heart failure: Secondary | ICD-10-CM

## 2016-02-03 LAB — COMPREHENSIVE METABOLIC PANEL
ALT: 29 U/L (ref 0–53)
AST: 25 U/L (ref 0–37)
Albumin: 4.2 g/dL (ref 3.5–5.2)
Alkaline Phosphatase: 132 U/L — ABNORMAL HIGH (ref 39–117)
BUN: 13 mg/dL (ref 6–23)
CALCIUM: 9.5 mg/dL (ref 8.4–10.5)
CHLORIDE: 95 meq/L — AB (ref 96–112)
CO2: 35 meq/L — AB (ref 19–32)
CREATININE: 0.87 mg/dL (ref 0.40–1.50)
GFR: 129.43 mL/min (ref >60.00)
GLUCOSE: 236 mg/dL — AB (ref 70–99)
Potassium: 3.1 mEq/L — ABNORMAL LOW (ref 3.5–5.1)
Sodium: 137 mEq/L (ref 135–145)
Total Bilirubin: 0.6 mg/dL (ref 0.2–1.2)
Total Protein: 8.2 g/dL (ref 6.0–8.3)

## 2016-02-03 LAB — HEMOGLOBIN A1C: HEMOGLOBIN A1C: 8.6 % — AB (ref 4.6–6.5)

## 2016-02-03 LAB — BRAIN NATRIURETIC PEPTIDE: PRO B NATRI PEPTIDE: 16 pg/mL (ref 0.0–100.0)

## 2016-02-03 MED ORDER — GABAPENTIN 300 MG PO CAPS: 300.0000 mg | ORAL_CAPSULE | Freq: Four times a day (QID) | ORAL | Status: DC

## 2016-02-03 MED ORDER — FUROSEMIDE 80 MG PO TABS: 80.0000 mg | ORAL_TABLET | Freq: Four times a day (QID) | ORAL | Status: DC

## 2016-02-03 MED ORDER — AMLODIPINE BESYLATE 10 MG PO TABS: 10.0000 mg | ORAL_TABLET | Freq: Every day | ORAL | Status: DC

## 2016-02-03 MED ORDER — FLUCONAZOLE 150 MG PO TABS: 150.0000 mg | ORAL_TABLET | ORAL | Status: DC

## 2016-02-03 MED ORDER — LORATADINE 10 MG PO TABS: 10.0000 mg | ORAL_TABLET | Freq: Every day | ORAL | Status: DC

## 2016-02-03 MED ORDER — ALLOPURINOL 300 MG PO TABS: 300.0000 mg | ORAL_TABLET | Freq: Every day | ORAL | Status: DC

## 2016-02-03 MED ORDER — FAMOTIDINE 20 MG PO TABS: 20.0000 mg | ORAL_TABLET | Freq: Every day | ORAL | Status: DC

## 2016-02-03 MED ORDER — POTASSIUM CHLORIDE CRYS ER 20 MEQ PO TBCR: 20.0000 meq | EXTENDED_RELEASE_TABLET | Freq: Three times a day (TID) | ORAL | Status: DC

## 2016-02-03 MED ORDER — METFORMIN HCL 500 MG PO TABS: 500.0000 mg | ORAL_TABLET | Freq: Every day | ORAL | Status: DC

## 2016-02-03 MED ORDER — METOPROLOL TARTRATE 50 MG PO TABS: 50.0000 mg | ORAL_TABLET | Freq: Two times a day (BID) | ORAL | Status: DC

## 2016-02-03 NOTE — Progress Notes (Signed)
   Subjective:    Patient ID: Derrick [name], male    DOB: 1981/07/30, 35 y.o.   MRN: [id number]  HPI The patient is a 35 YO man coming in for neuropathy in his legs as well as edema. He has been using gabapentin for the pain and it is not helping much. He is taking 300 mg TID. He is taking lasix TID for the edema. He does not have compression stockings and does not elevate his legs at home. He is up 10 pounds since 2 months ago when seen for bronchitis. He is taking the invokana but thinks he has a yeast infection with some white discharge. Same partner and no new contacts. They are monogamous.   Review of Systems  Constitutional: Positive for activity change, appetite change and unexpected weight change. Negative for fever, chills and fatigue.  HENT: Negative.   Eyes: Negative.   Respiratory: Negative for cough, chest tightness, shortness of breath and wheezing.   Cardiovascular: Positive for leg swelling. Negative for chest pain and palpitations.  Gastrointestinal: Positive for diarrhea. Negative for nausea, vomiting, abdominal pain, constipation, blood in stool and abdominal distention.  Musculoskeletal: Positive for arthralgias. Negative for myalgias, back pain and gait problem.  Skin: Negative for color change, pallor and rash.  Neurological: Negative for dizziness, weakness, light-headedness and headaches.  Psychiatric/Behavioral: Negative.       Objective:   Physical Exam  Constitutional: He is oriented to person, place, and time. He appears well-developed and well-nourished.  Morbidly obese  HENT:  Head: Normocephalic and atraumatic.  Eyes: EOM are normal.  Neck: Normal range of motion.  Cardiovascular: Normal rate and regular rhythm.   Pulmonary/Chest: Effort normal and breath sounds normal. No respiratory distress. He has no wheezes. He has no rales.  Abdominal: Soft. Bowel sounds are normal. He exhibits no distension. There is no tenderness. There is no rebound.    Musculoskeletal: He exhibits edema.  1-2+ bilateral edema pitting to knees  Neurological: He is alert and oriented to person, place, and time. Coordination normal.  Skin: Skin is warm and dry.  Psychiatric: He has a normal mood and affect.   Filed Vitals:   02/03/16 1317  BP: 160/82  Pulse: 98  Temp: 98 F (36.7 C)  TempSrc: Oral  Resp: 22  Height: 5\' 8"  (1.727 m)  Weight: 429 lb (194.593 kg)  SpO2: 93%      Assessment & Plan:

## 2016-02-03 NOTE — Progress Notes (Signed)
  Pre-Op Assessment Visit:  Pre-Operative sleeve gastrectomy Surgery  Medical Nutrition Therapy:  Appt start time: 1605   End time:  1640.  Pt returns for his first SWL appointment having lost 12 pounds. Pt states one of his Medications makes him thirsty. Pt states every now and then he wants lemonade. Pt states he has cut out eating at restaurants and only eats fried foods once a month. Pt states he was in the hospital for 40 days in November. Pt states he has been looking up recipes online for meal prep. Pt states he loves carrots and celery.  Preferred Learning Style:   No preference indicated   Learning Readiness:   Ready  Handouts given during visit include:  Pre-Op Goals Bariatric Surgery Protein Shakes  Physical activity: ADL's BMI: 64.3   Patient-Centered Goals: Goals: lose weight so he can lose trach/improve sleep apnea, be able to play with son  7 confidence/10 importance scale   Demonstrated degree of understanding via:  Teach Back  Teaching Method Utilized:  Visual Auditory Hands on  Barriers to learning/adherence to lifestyle change: none  Patient to call the [facility] to enroll in Pre-Op and Post-Op Nutrition Education when surgery date is scheduled.

## 2016-02-03 NOTE — Patient Instructions (Signed)
We have sent in the lasix 80 mg to take 4 times per day.   We have sent in the lower dose metformin to take daily.   We have sent in the refills in today.  We have sent in the diflucan to take for the yeast. Take 1 pill today, then 1 pill on Sunday, then 1 pill on Tuesday.   We have given you the compression stocking prescription.

## 2016-02-03 NOTE — Progress Notes (Signed)
Pre visit review using our clinic review tool, if applicable. No additional management support is needed unless otherwise documented below in the visit note. 

## 2016-02-04 DIAGNOSIS — B379 Candidiasis, unspecified: Secondary | ICD-10-CM | POA: Insufficient documentation

## 2016-02-04 NOTE — Assessment & Plan Note (Signed)
Complicated by neuropathy which is worsening, increase gabapentin to 300 mg QID. Suspect he is not taking his metformin as advised due to stomach symptoms so will decrease to 500 mg daily (from 1000 mg daily). He will continue the invokana. Checking HGA1c and adjust as needed.

## 2016-02-04 NOTE — Assessment & Plan Note (Signed)
Do not suspect flare today. Will change lasix to 80 mg TID with additional dose prn. Legs are swollen but suspect some from morbid obesity and not heart failure. Checking BNP for fluid status.

## 2016-02-04 NOTE — Assessment & Plan Note (Signed)
Rx for diflucan times 3 doses 72 hours apart. This is complicated by the diabetes concurrent which is not well controlled. He is at risk for this infection due to the invokana. If he has another infection we would need to change agents. Continue invokana for now.

## 2016-02-22 ENCOUNTER — Ambulatory Visit: Payer: 59 | Admitting: Internal Medicine

## 2016-02-23 ENCOUNTER — Encounter: Payer: Self-pay | Admitting: Podiatry

## 2016-02-23 ENCOUNTER — Ambulatory Visit (INDEPENDENT_AMBULATORY_CARE_PROVIDER_SITE_OTHER): Payer: BLUE CROSS/BLUE SHIELD

## 2016-02-23 ENCOUNTER — Ambulatory Visit: Payer: BLUE CROSS/BLUE SHIELD

## 2016-02-23 ENCOUNTER — Ambulatory Visit (INDEPENDENT_AMBULATORY_CARE_PROVIDER_SITE_OTHER): Payer: BLUE CROSS/BLUE SHIELD | Admitting: Podiatry

## 2016-02-23 VITALS — BP 129/81 | HR 85 | Resp 16 | Ht 70.0 in | Wt >= 6400 oz

## 2016-02-23 DIAGNOSIS — M79671 Pain in right foot: Secondary | ICD-10-CM | POA: Diagnosis not present

## 2016-02-23 DIAGNOSIS — M79672 Pain in left foot: Secondary | ICD-10-CM | POA: Diagnosis not present

## 2016-02-23 DIAGNOSIS — M779 Enthesopathy, unspecified: Secondary | ICD-10-CM

## 2016-02-23 MED ORDER — TRIAMCINOLONE ACETONIDE 10 MG/ML IJ SUSP
10.0000 mg | Freq: Once | INTRAMUSCULAR | Status: AC
  Administered 2016-02-23: 10 mg

## 2016-02-23 NOTE — Progress Notes (Signed)
Subjective:     Patient ID: Derrick [name], male   DOB: Oct 27, 1980, 35 y.o.   MRN: [id number]  HPI patient presents stating that he is having pain in both of his ankles and his feet and he saw person extreme obesity but is hopefully trying to lose weight   Review of Systems  All other systems reviewed and are negative.      Objective:   Physical Exam  Constitutional: He is oriented to person, place, and time.  Cardiovascular: Intact distal pulses.   Musculoskeletal: Normal range of motion.  Neurological: He is oriented to person, place, and time.  Skin: Skin is warm and dry.  Nursing note and vitals reviewed.  neurovascular status is found to be intact with +2 pitting edema around the ankles bilateral and negative Homans sign bilateral. Patient has extreme obesity and his weight up to 480 pounds and is found to have exquisite discomfort in the ankles bilateral with moderate depression of the arch bilateral and indications of breakdown of feet secondary to the extreme weight on them. He does have good digital perfusion and is well oriented 3     Assessment:     Very poor health individual with pain in the ankle region bilateral with depression of the arch and chronic nature to condition with diabetes also    Plan:     H&P and all conditions reviewed and weight loss encouraged. I injected the ankle region bilateral 3 mg Kenalog 5 mg Xylocaine and did go ahead and scanned him for custom [employer] orthotics to try to reduce plantar pressures on his feet. Discussed long-term orthotics which may be a necessity  X-ray report indicates depression of the arch with severe

## 2016-02-23 NOTE — Progress Notes (Signed)
   Subjective:    Patient ID: Derrick [name], male    DOB: [birth year], 35 y.o.   MRN: [id number]  HPI Chief Complaint  Patient presents with  . Foot Pain    Bilateral; plantar; Right foot-Dorsal & lateral; Left foot-Back of heel & lateral; pt stated, "Pain moves around in feet, not consistent in one area"; pt diabetic type 2; sugar=did not take today; A1C=8.5      Review of Systems  Cardiovascular: Positive for leg swelling.  All other systems reviewed and are negative.      Objective:   Physical Exam        Assessment & Plan:

## 2016-02-25 ENCOUNTER — Ambulatory Visit: Payer: BLUE CROSS/BLUE SHIELD | Admitting: Internal Medicine

## 2016-02-28 ENCOUNTER — Encounter: Payer: Self-pay | Admitting: Internal Medicine

## 2016-02-28 ENCOUNTER — Ambulatory Visit (INDEPENDENT_AMBULATORY_CARE_PROVIDER_SITE_OTHER): Payer: BLUE CROSS/BLUE SHIELD | Admitting: Internal Medicine

## 2016-02-28 VITALS — BP 122/72 | HR 97 | Temp 98.5°F | Ht 70.0 in | Wt >= 6400 oz

## 2016-02-28 DIAGNOSIS — I5032 Chronic diastolic (congestive) heart failure: Secondary | ICD-10-CM

## 2016-02-28 NOTE — Assessment & Plan Note (Signed)
Swelling in his legs is stable to improved today and recent BNP is low. Appears to be euvolemic and informed him that he does not need more fluid medication. Given another prescription for compression stockings as the best alternative. I suspect his morbid obesity is part of the cause of this problem.

## 2016-02-28 NOTE — Progress Notes (Signed)
   Subjective:    Patient ID: Derrick [name], male    DOB: 1981-04-03, 35 y.o.   MRN: [id number]  HPI The patient is a 35 YO man coming in for follow up of his leg swelling. Once again he has lost the prescription for the compression stockings. They are doing okay overall. They do swell up during the day. He is still having pain in his feet and is getting inserts from podiatry soon which he is hopeful may help. Does try to prop his legs when he can and does mild amounts of walking.   Review of Systems  Constitutional: Negative for activity change, appetite change, chills, fatigue, fever and unexpected weight change.  Respiratory: Negative for cough, chest tightness, shortness of breath and wheezing.   Cardiovascular: Positive for leg swelling. Negative for chest pain and palpitations.  Gastrointestinal: Negative for abdominal distention, abdominal pain, blood in stool, constipation, nausea and vomiting.  Musculoskeletal: Positive for arthralgias. Negative for back pain, gait problem and myalgias.  Skin: Negative for color change, pallor and rash.  Neurological: Negative for dizziness, weakness, light-headedness and headaches.      Objective:   Physical Exam  Constitutional: He is oriented to person, place, and time. He appears well-developed and well-nourished.  Morbidly obese  HENT:  Head: Normocephalic and atraumatic.  Eyes: EOM are normal.  Neck: Normal range of motion.  Cardiovascular: Normal rate and regular rhythm.   Pulmonary/Chest: Effort normal and breath sounds normal. No respiratory distress. He has no wheezes. He has no rales.  Abdominal: Soft. Bowel sounds are normal. He exhibits no distension. There is no tenderness. There is no rebound.  Musculoskeletal: He exhibits edema.  1+ bilateral edema pitting to knees, overall stable to improved  Neurological: He is alert and oriented to person, place, and time. Coordination normal.  Skin: Skin is warm and dry.   Vitals:     02/28/16 0943  BP: 122/72  Pulse: 97  Temp: 98.5 F (36.9 C)  SpO2: 93%  Weight: (!) 419 lb (190.1 kg)  Height: 5\' 10"  (1.778 m)      Assessment & Plan:

## 2016-02-28 NOTE — Progress Notes (Signed)
Pre visit review using our clinic review tool, if applicable. No additional management support is needed unless otherwise documented below in the visit note. 

## 2016-02-28 NOTE — Patient Instructions (Signed)
We have given you a prescription for the compression stockings that you can get to help with the swelling.

## 2016-03-06 ENCOUNTER — Ambulatory Visit (HOSPITAL_BASED_OUTPATIENT_CLINIC_OR_DEPARTMENT_OTHER)
Admission: RE | Admit: 2016-03-06 | Discharge: 2016-03-06 | Disposition: A | Discharge status: Home / Self Care | Payer: BLUE CROSS/BLUE SHIELD | Source: Ambulatory Visit | Attending: Medical | Admitting: Medical

## 2016-03-06 ENCOUNTER — Encounter: Payer: Self-pay | Admitting: Medical

## 2016-03-06 ENCOUNTER — Ambulatory Visit (INDEPENDENT_AMBULATORY_CARE_PROVIDER_SITE_OTHER): Payer: BLUE CROSS/BLUE SHIELD | Admitting: Medical

## 2016-03-06 VITALS — BP 124/80 | HR 67 | Temp 98.0°F | Resp 16 | Ht 70.0 in | Wt >= 6400 oz

## 2016-03-06 DIAGNOSIS — Z8679 Personal history of other diseases of the circulatory system: Secondary | ICD-10-CM

## 2016-03-06 DIAGNOSIS — J209 Acute bronchitis, unspecified: Secondary | ICD-10-CM

## 2016-03-06 DIAGNOSIS — R05 Cough: Secondary | ICD-10-CM | POA: Diagnosis present

## 2016-03-06 DIAGNOSIS — J3489 Other specified disorders of nose and nasal sinuses: Secondary | ICD-10-CM | POA: Diagnosis not present

## 2016-03-06 DIAGNOSIS — J9811 Atelectasis: Secondary | ICD-10-CM | POA: Diagnosis not present

## 2016-03-06 DIAGNOSIS — R5383 Other fatigue: Secondary | ICD-10-CM

## 2016-03-06 DIAGNOSIS — R1013 Epigastric pain: Secondary | ICD-10-CM

## 2016-03-06 DIAGNOSIS — R062 Wheezing: Secondary | ICD-10-CM | POA: Diagnosis not present

## 2016-03-06 DIAGNOSIS — M79662 Pain in left lower leg: Secondary | ICD-10-CM

## 2016-03-06 MED ORDER — ALBUTEROL SULFATE HFA 108 (90 BASE) MCG/ACT IN AERS: 2.0000 | INHALATION_SPRAY | Freq: Four times a day (QID) | RESPIRATORY_TRACT | 0 refills | Status: DC | PRN

## 2016-03-06 MED ORDER — FLUTICASONE PROPIONATE 50 MCG/ACT NA SUSP: 2.0000 | Freq: Every day | NASAL | 1 refills | Status: DC

## 2016-03-06 MED ORDER — RANITIDINE HCL 150 MG PO CAPS: 150.0000 mg | ORAL_CAPSULE | Freq: Two times a day (BID) | ORAL | 0 refills | Status: DC

## 2016-03-06 MED ORDER — AZITHROMYCIN 250 MG PO TABS: ORAL_TABLET | ORAL | 0 refills | Status: DC

## 2016-03-06 MED ORDER — FLUTICASONE PROPIONATE HFA 110 MCG/ACT IN AERO: 2.0000 | INHALATION_SPRAY | Freq: Two times a day (BID) | RESPIRATORY_TRACT | 3 refills | Status: DC

## 2016-03-06 NOTE — Progress Notes (Signed)
Pre visit review using our clinic review tool, if applicable. No additional management support is needed unless otherwise documented below in the visit note./HSM  

## 2016-03-06 NOTE — Progress Notes (Signed)
Subjective:    Patient ID: Derrick [name], male    DOB: Dec 11, 1980, 35 y.o.   MRN: [id number]  HPI  Pt in for evaluation. Pt reports feeling fatigued all over.  Pt feels some chest congestion and some nasal congestion.(For about 2 wks) Pt feels like needs to cough up mucous but can't.No fever, no chills or sweats.  Pt has hx of chf.(Last EF was 55-60%)  BNP- 1 month  Pt last cxr showed borderline heart enlargement.But stable.  Pt faint stuffiness in nose.  Pt has gotten his compression stocking filled.  Pt has diabetic. Pt sees Dr. Sharlet Salina for diabetes as well. Pt is on metformin 1000 mg a day.   Pt does have some history of wheezing. I did rx flovent and albuterol on his last visit.  Pt has had some calf pain left side since Saturday.   Pt also states upset stomach since getting up on Saturday. Upset before and after eating. No vomiting but some dry heaving. Denies heart burn. No diarrhea or constipation.     Review of Systems  Constitutional: Positive for fatigue. Negative for chills and fever.  HENT: Positive for congestion. Negative for sinus pressure.   Respiratory: Positive for cough, shortness of breath and wheezing. Negative for chest tightness.   Cardiovascular: Negative for chest pain and palpitations.  Gastrointestinal: Positive for abdominal pain. Negative for abdominal distention, blood in stool, constipation, diarrhea, nausea, rectal pain and vomiting.  Musculoskeletal: Negative for back pain.       Left calf pain.  Skin: Negative for rash.  Neurological: Negative for dizziness, numbness and headaches.  Hematological: Negative for adenopathy. Does not bruise/bleed easily.  Psychiatric/Behavioral: Negative for behavioral problems and confusion.   Past Medical History:  Diagnosis Date  . Acute on chronic diastolic CHF (congestive heart failure), NYHA class 1 ([place])   . Diabetes mellitus without complication ([place])   . GERD (gastroesophageal reflux  disease)   . Hypertension   . Reflux      Social History   Social History  . Marital status: Married    Spouse name: N/A  . Number of children: N/A  . Years of education: N/A   Occupational History  . Not on file.   Social History Main Topics  . Smoking status: Never Smoker  . Smokeless tobacco: Never Used  . Alcohol use No  . Drug use: No  . Sexual activity: Not on file   Other Topics Concern  . Not on file   Social History Narrative  . No narrative on file    Past Surgical History:  Procedure Laterality Date  . TRACHEOSTOMY TUBE PLACEMENT N/A 06/21/2015   Procedure: TRACHEOSTOMY;  Surgeon: Leta Baptist, MD;  Location: [facility] OR;  Service: ENT;  Laterality: N/A;    Family History  Problem Relation Age of Onset  . Diabetes Maternal Grandmother   . Hypertension Maternal Grandmother     Allergies  Allergen Reactions  . Shrimp [Shellfish Allergy]   . Vancomycin Other (See Comments)  . Zosyn [Piperacillin Sod-Tazobactam So] Other (See Comments)    Current Outpatient Prescriptions on File Prior to Visit  Medication Sig Dispense Refill  . albuterol (PROVENTIL HFA;VENTOLIN HFA) 108 (90 Base) MCG/ACT inhaler Inhale 2 puffs into the lungs every 6 (six) hours as needed for wheezing or shortness of breath. 1 Inhaler 0  . allopurinol (ZYLOPRIM) 300 MG tablet Take 1 tablet (300 mg total) by mouth daily. 30 tablet 3  . amLODipine (NORVASC) 10 MG tablet  Take 1 tablet (10 mg total) by mouth daily. 30 tablet 3  . canagliflozin (INVOKANA) 300 MG TABS tablet Take 1 tablet (300 mg total) by mouth daily before breakfast. 30 tablet 6  . famotidine (PEPCID) 20 MG tablet Take 1 tablet (20 mg total) by mouth daily. 30 tablet 3  . fluconazole (DIFLUCAN) 150 MG tablet Take 1 tablet (150 mg total) by mouth every 3 (three) days. 3 tablet 0  . fluticasone (FLONASE) 50 MCG/ACT nasal spray Place 2 sprays into both nostrils daily. 16 g 6  . fluticasone (FLOVENT HFA) 110 MCG/ACT inhaler Inhale 2 puffs  into the lungs 2 (two) times daily. 1 Inhaler 12  . furosemide (LASIX) 80 MG tablet Take 1 tablet (80 mg total) by mouth 4 (four) times daily. 120 tablet 3  . gabapentin (NEURONTIN) 300 MG capsule Take 1 capsule (300 mg total) by mouth 4 (four) times daily. 120 capsule 3  . loratadine (CLARITIN) 10 MG tablet Take 1 tablet (10 mg total) by mouth daily. 30 tablet 3  . metFORMIN (GLUCOPHAGE) 500 MG tablet Take 1 tablet (500 mg total) by mouth daily with breakfast. 90 tablet 3  . metoprolol (LOPRESSOR) 50 MG tablet Take 1 tablet (50 mg total) by mouth 2 (two) times daily. 60 tablet 3  . nitroGLYCERIN (NITROSTAT) 0.4 MG SL tablet Place 0.4 mg under the tongue every 5 (five) minutes as needed for chest pain.    . potassium chloride SA (K-DUR,KLOR-CON) 20 MEQ tablet Take 1 tablet (20 mEq total) by mouth 3 (three) times daily. 270 tablet 3  . scopolamine (TRANSDERM-SCOP) 1 MG/3DAYS Place 1 patch onto the skin every 3 (three) days. Reported on 11/24/2015    . Tracheostomy Care KIT 30 kits by Does not apply route daily. 30 each 6   No current facility-administered medications on file prior to visit.     BP 124/80 (BP Location: Left Arm, Patient Position: Sitting, Cuff Size: Large)   Pulse 67   Temp 98 F (36.7 C) (Oral)   Resp 16   Ht '5\' 10"'$  (1.778 m)   Wt (!) 419 lb (190.1 kg)   SpO2 92%   BMI 60.12 kg/m       Objective:   Physical Exam  General  Mental Status - Alert. General Appearance - Well groomed. Not in acute distress.  Skin Rashes- No Rashes.  HEENT Head- Normal. Ear Auditory Canal - Left- Normal. Right - Normal.Tympanic Membrane- Left- Normal. Right- Normal. Eye Sclera/Conjunctiva- Left- Normal. Right- Normal. Nose & Sinuses Nasal Mucosa- Left-  Boggy and Congested. Right-  Boggy and  Congested.Bilateral maxillary and frontal sinus pressure. Mouth & Throat Lips: Upper Lip- Normal: no dryness, cracking, pallor, cyanosis, or vesicular eruption. Lower Lip-Normal: no dryness,  cracking, pallor, cyanosis or vesicular eruption. Buccal Mucosa- Bilateral- No Aphthous ulcers. Oropharynx- No Discharge or Erythema. Tonsils: Characteristics- Bilateral- No Erythema or Congestion. Size/Enlargement- Bilateral- No enlargement. Discharge- bilateral-None.  Neck Neck- Supple. No Masses.   Chest and Lung Exam Auscultation: Breath Sounds:-Clear even and unlabored.  Cardiovascular Auscultation:Rythm- Regular, rate and rhythm. Murmurs & Other Heart Sounds:Ausculatation of the heart reveal- No Murmurs.  Lymphatic Head & Neck General Head & Neck Lymphatics: Bilateral: Description- No Localized lymphadenopathy.  Lower ext- calfs symmetric. No swelling. Left calf upper calf section tender to palpation. Faint + homan sign      Assessment & Plan:  For your bronchitis type symptom and sinus pressure will rx azithromycin and rx flonase for nasal congestion/pressure.  For wheezing  rx flovent and albuterol.  To assess chest congestion get cxr today.  For chf hx and fatigue get labs.  For abdomen pain will get labs and rx ranitidine.  For left lower ext/calf pain will get left lower ext [place] stat.  Follow up in 7 days or as needed   Titilayo Hagans, Percell Miller, [employer]

## 2016-03-06 NOTE — Patient Instructions (Addendum)
For your bronchitis type symptom and sinus pressure will rx azithromycin and rx flonase for nasal congestion/pressure.  For wheezing rx flovent and albuterol.  To assess chest congestion get cxr today.  For chf hx and fatigue get labs.  For abdomen pain will get labs and rx ranitidine.  For left lower ext/calf pain will get left lower ext US stat.  Follow up in 7 days or as needed

## 2016-03-08 ENCOUNTER — Other Ambulatory Visit: Payer: BLUE CROSS/BLUE SHIELD

## 2016-03-09 ENCOUNTER — Ambulatory Visit: Payer: BLUE CROSS/BLUE SHIELD | Admitting: Skilled Nursing Facility1

## 2016-03-15 ENCOUNTER — Ambulatory Visit (INDEPENDENT_AMBULATORY_CARE_PROVIDER_SITE_OTHER): Payer: BLUE CROSS/BLUE SHIELD

## 2016-03-15 ENCOUNTER — Ambulatory Visit (INDEPENDENT_AMBULATORY_CARE_PROVIDER_SITE_OTHER): Payer: BLUE CROSS/BLUE SHIELD | Admitting: Podiatry

## 2016-03-15 DIAGNOSIS — M79672 Pain in left foot: Secondary | ICD-10-CM | POA: Diagnosis not present

## 2016-03-15 DIAGNOSIS — M779 Enthesopathy, unspecified: Secondary | ICD-10-CM | POA: Diagnosis not present

## 2016-03-15 NOTE — Progress Notes (Signed)
Patient ID: [name], male   DOB: [birth year], 35 y.o.   MRN: [id number]  Patient presents for orthotic pick up.  Verbal and written break in and wear instructions given.  Patient will follow up in 4 weeks if symptoms worsen or fail to improve.

## 2016-03-15 NOTE — Progress Notes (Signed)
Subjective:     Patient ID: Derrick [name], male   DOB: 27-May-1981, 35 y.o.   MRN: [id number]  HPI extremely obese male with chief complaint of pain in the posterior calf muscle left and swelling in the left foot with obesity been such a complicating factor   Review of Systems     Objective:   Physical Exam Neurovascular status found to be hard to ascertain due to the obesity the patient has with patient stating that he's already had a Doppler done to rule out a blood clot of his left lower leg    Assessment:     Probable inflammatory condition with no other pathology currently noted except for the severe obesity    Plan:     Went ahead and recommended heat therapy and if he should develop any other swelling or any other issues to go straight to the emergency room but at this time he has been ruled out from a blood clot according to him and we will not pursue that but I did give him strict instructions that if he should start to develop more swelling or pain that he needs to go straight to the emergency room

## 2016-03-15 NOTE — Patient Instructions (Signed)

## 2016-03-16 ENCOUNTER — Ambulatory Visit: Payer: BLUE CROSS/BLUE SHIELD | Admitting: Skilled Nursing Facility1

## 2016-03-22 ENCOUNTER — Encounter: Payer: BLUE CROSS/BLUE SHIELD | Attending: General Surgery | Admitting: Skilled Nursing Facility1

## 2016-03-22 ENCOUNTER — Encounter: Payer: Self-pay | Admitting: Skilled Nursing Facility1

## 2016-03-22 DIAGNOSIS — Z01818 Encounter for other preprocedural examination: Secondary | ICD-10-CM | POA: Diagnosis not present

## 2016-03-22 NOTE — Progress Notes (Signed)
  Pre-Op Assessment Visit:  Pre-Operative sleeve gastrectomy Surgery  Medical Nutrition Therapy:  Appt start time: 1605   End time:  1640.  Pt returns for his second SWL appointment having gained 10 pounds.  Pt states his insurance starts over January first. Pt states his eating style has changed tremendously. Pt states he has not eaten fried food at all. Pt states he has been working on drinking more water. Pt states he has been trying to eliminate meat this past month-Dietitian educated the pt on the recommendations after surgery. Pt states he has been working on chewing but it has been hard because he is a Technical sales engineermusician. Pt states he has been working on eating before 7pm. Pt states he avoids temptation by having someone grocery shop for him.    Preferred Learning Style:   No preference indicated   Learning Readiness:   Ready  Handouts given during visit include:  Pre-Op Goals Bariatric Surgery Protein Shakes  Physical activity: ADL's    Patient-Centered Goals: Goals: lose weight so he can lose trach/improve sleep apnea, be able to play with son  7 confidence/10 importance scale   Demonstrated degree of understanding via:  Teach Back  Teaching Method Utilized:  Visual Auditory Hands on  Barriers to learning/adherence to lifestyle change: none  [facility] to enroll in Pre-Op and Post-Op Nutrition Education when surgery date is scheduled.

## 2016-03-30 ENCOUNTER — Telehealth: Payer: Self-pay | Admitting: Internal Medicine

## 2016-03-30 NOTE — Telephone Encounter (Signed)
Patient states that mucinex is not helping with congestion.  States he has already seen Dr. Okey Duprerawford for this.  He is requesting something to [facility] [place] on [address].

## 2016-03-30 NOTE — Telephone Encounter (Signed)
Patient does want referral to lung doctor. Since referral takes about two weeks for process.  Patient has scheduled appt with Dr. Okey Duprerawford to be seen in the morning.

## 2016-03-30 NOTE — Telephone Encounter (Signed)
He has not seen me in about 1 month and since has seen someone else and been given antibiotics. He can either come see me again or be referred to lung doctor to evaluate.

## 2016-03-31 ENCOUNTER — Encounter: Payer: Self-pay | Admitting: Internal Medicine

## 2016-03-31 ENCOUNTER — Ambulatory Visit (INDEPENDENT_AMBULATORY_CARE_PROVIDER_SITE_OTHER): Payer: BLUE CROSS/BLUE SHIELD | Admitting: Internal Medicine

## 2016-03-31 DIAGNOSIS — J3089 Other allergic rhinitis: Secondary | ICD-10-CM | POA: Diagnosis not present

## 2016-03-31 DIAGNOSIS — I10 Essential (primary) hypertension: Secondary | ICD-10-CM

## 2016-03-31 DIAGNOSIS — E118 Type 2 diabetes mellitus with unspecified complications: Secondary | ICD-10-CM

## 2016-03-31 DIAGNOSIS — I5032 Chronic diastolic (congestive) heart failure: Secondary | ICD-10-CM

## 2016-03-31 MED ORDER — LEVOCETIRIZINE DIHYDROCHLORIDE 5 MG PO TABS: 5.0000 mg | ORAL_TABLET | Freq: Every evening | ORAL | 3 refills | Status: DC

## 2016-03-31 MED ORDER — TRIAMTERENE-HCTZ 37.5-25 MG PO TABS: 1.0000 | ORAL_TABLET | Freq: Every day | ORAL | 3 refills | Status: DC

## 2016-03-31 MED ORDER — GLIPIZIDE ER 10 MG PO TB24: 10.0000 mg | ORAL_TABLET | Freq: Every day | ORAL | 6 refills | Status: DC

## 2016-03-31 NOTE — Progress Notes (Signed)
Pre visit review using our clinic review tool, if applicable. No additional management support is needed unless otherwise documented below in the visit note. 

## 2016-03-31 NOTE — Progress Notes (Signed)
   Subjective:    Patient ID: Derrick [name], male    DOB: 04/24/1981, 35 y.o.   MRN: [id number]  HPI The patient is a 35 YO man coming in with multiple concerns including his leg swelling (finally got and wearing his compression stockings, wants to know if it is related to meds given he did not have it before, overall improved some with the stockings but they bother him, taking lasix with good output, recent US without DVT), head congestion (taking claritin, not taking the flonase and cannot do a nose spray, this is draining and bothering his trach and breathing, no sinus pressure or fevers or chills or ear pain, going on for months, took a course of antibiotics about 1 month ago which did not help much), and his diabetes (he has stopped taking invokana without our advice and is taking less of his metformin due to GI side effects, last HgA1c is up and he is worried about that some, denies changes to diet or exercise, weight stable). Some other minor concerns which we are not able to address today.   Review of Systems  Constitutional: Negative for activity change, appetite change, chills, fatigue, fever and unexpected weight change.  HENT: Positive for congestion, postnasal drip and rhinorrhea. Negative for ear discharge, ear pain, sinus pressure, sore throat and trouble swallowing.   Eyes: Negative.   Respiratory: Negative for cough, chest tightness, shortness of breath and wheezing.   Cardiovascular: Positive for leg swelling. Negative for chest pain and palpitations.  Gastrointestinal: Negative for abdominal distention, abdominal pain, blood in stool, constipation, nausea and vomiting.  Musculoskeletal: Positive for arthralgias. Negative for back pain, gait problem and myalgias.  Skin: Negative for color change, pallor and rash.  Neurological: Negative for dizziness, weakness, light-headedness and headaches.      Objective:   Physical Exam  Constitutional: He is oriented to person,  place, and time. He appears well-developed and well-nourished.  Morbidly obese  HENT:  Head: Normocephalic and atraumatic.  Oropharynx with redness and clear drainage, no purulent discharge. No sinus pressure.   Eyes: EOM are normal.  Neck: Normal range of motion. No JVD present.  Cardiovascular: Normal rate and regular rhythm.   Pulmonary/Chest: Effort normal and breath sounds normal. No respiratory distress. He has no wheezes. He has no rales.  Abdominal: Soft. Bowel sounds are normal. He exhibits no distension. There is no tenderness. There is no rebound.  Musculoskeletal: He exhibits edema.  1+ bilateral edema pitting to knees, overall stable to improved, stockings in place  Lymphadenopathy:    He has no cervical adenopathy.  Neurological: He is alert and oriented to person, place, and time. Coordination normal.  Skin: Skin is warm and dry.   Vitals:   03/31/16 1124  BP: (!) 148/70  Pulse: 88  Resp: (!) 22  Temp: 98.1 F (36.7 C)  TempSrc: Oral  SpO2: 90%  Weight: (!) 428 lb (194.1 kg)  Height: 5\' 9"  (1.753 m)      Assessment & Plan:

## 2016-03-31 NOTE — Assessment & Plan Note (Signed)
BP is mildly elevated today. Will stop amlodipine due to possibly worsening his leg swelling. Continue lasix and add triamterne/hctz today for BP. Also on metoprolol. HR okay.

## 2016-03-31 NOTE — Assessment & Plan Note (Signed)
Leg swelling is down some with the compression hose. He wants this to be gone and we talked today about the fact that he may continue to have swelling and that this could be related to the injury in the hospital. He is not happy about that. We will try to stop amlodipine (could be worsening the swelling) and switch it to hctz/triamterne to see if this helps. No signs of volume overload and recent CXR and BNP normal.

## 2016-03-31 NOTE — Patient Instructions (Signed)
We are sending in a new blood pressure medicine called triamterene/hctz which will replace the amlodipine. Take 1 pill daily.  We have sent in xyzal to replace the claritin. Take 1 pill daily.   We have sent in glipizide to help replace the invokana. Take 1 pill daily with food (this can decrease the sugar so it is important to take with food).

## 2016-03-31 NOTE — Assessment & Plan Note (Signed)
Since he is not taking his invokana and not taking metformin as directed will add glipizide since last HgA1c not at goal. Does not need labs today. We talked about the fact that his neuropathy can worsen and he can develop other medical problems with untreated diabetes.

## 2016-03-31 NOTE — Assessment & Plan Note (Signed)
He is taking the claritin but not flonase. He states that he will not take this due to not liking nose sprays. Will stop claritin and rx xyzal for better relief. Have referred to pulmonary as well for the breathing problems to adjust his meds if needed. Lungs are clear on exam today and suspect upper sinus source.

## 2016-04-04 ENCOUNTER — Ambulatory Visit: Payer: BLUE CROSS/BLUE SHIELD | Admitting: Skilled Nursing Facility1

## 2016-04-05 ENCOUNTER — Inpatient Hospital Stay (HOSPITAL_COMMUNITY)
Admission: RE | Admit: 2016-04-05 | Discharge: 2016-04-05 | Disposition: A | Discharge status: Home / Self Care | Payer: BLUE CROSS/BLUE SHIELD | Source: Ambulatory Visit

## 2016-04-05 ENCOUNTER — Ambulatory Visit (HOSPITAL_COMMUNITY): Payer: BLUE CROSS/BLUE SHIELD

## 2016-04-05 NOTE — Progress Notes (Signed)
   Patient ID: Derrick HansenReginald A Cangelosi, male    DOB: 1980-10-13, 35 y.o.   MRN: [id number]  Subjective:     HPI  Review of Systems     Objective:   Physical Exam        Assessment & Plan:  Tracheostomy dependence in setting of severe OSA Plan Appointment rescheduled

## 2016-04-19 ENCOUNTER — Ambulatory Visit (HOSPITAL_COMMUNITY): Payer: BLUE CROSS/BLUE SHIELD

## 2016-04-20 ENCOUNTER — Ambulatory Visit (HOSPITAL_COMMUNITY): Admission: RE | Admit: 2016-04-20 | Payer: BLUE CROSS/BLUE SHIELD | Source: Ambulatory Visit

## 2016-04-21 ENCOUNTER — Ambulatory Visit (HOSPITAL_COMMUNITY)
Admission: RE | Admit: 2016-04-21 | Discharge: 2016-04-21 | Disposition: A | Discharge status: Home / Self Care | Payer: BLUE CROSS/BLUE SHIELD | Source: Ambulatory Visit | Attending: Acute Care | Admitting: Acute Care

## 2016-04-21 DIAGNOSIS — Z430 Encounter for attention to tracheostomy: Secondary | ICD-10-CM | POA: Insufficient documentation

## 2016-04-21 NOTE — Progress Notes (Signed)
Tracheostomy Procedure Note  Mitzi HansenReginald A Grenz [id number] Jul 12, 1981  Pre Procedure Tracheostomy Information  Trach Brand: Shiley Size: 5 Style: Proximal and Uncuffed Secured by: Velcro  VS- BP-127/86, HR-98, RR-18, SpO2-98% on RA with PMV  Procedure: trach change    Post Procedure Tracheostomy Information  Trach Brand: Shiley Size: 5 Style: Proximal and Uncuffed Secured by: Velcro   Post Procedure Evaluation:  ETCO2 positive color change from yellow to purple : Yes.   Vital signs:blood pressure 130/88, pulse 98, respirations 18 and pulse oximetry 98% on RA with PMV Patients current condition: stable Complications: No apparent complications Trach site exam: clean, dry Wound care done: 4 x 4 gauze Patient did tolerate procedure well.   Education: None  Pt given new PMV. See back in trach clinic in 3 months

## 2016-04-24 ENCOUNTER — Ambulatory Visit: Payer: BLUE CROSS/BLUE SHIELD | Admitting: Skilled Nursing Facility1

## 2016-04-25 NOTE — Addendum Note (Signed)
Encounter addended by: Simonne MartinetPeter E Takina Busser, NP on: 04/25/2016 12:36 PM<BR>    Actions taken: Sign clinical note

## 2016-04-27 ENCOUNTER — Encounter: Payer: Self-pay | Admitting: Dietician

## 2016-04-27 ENCOUNTER — Encounter: Payer: BLUE CROSS/BLUE SHIELD | Attending: General Surgery | Admitting: Dietician

## 2016-04-27 DIAGNOSIS — Z01818 Encounter for other preprocedural examination: Secondary | ICD-10-CM | POA: Insufficient documentation

## 2016-04-27 NOTE — Patient Instructions (Signed)
-  Find a sugar free lemonade that you like -Keep up the good work!

## 2016-04-27 NOTE — Progress Notes (Signed)
  Pre-Op Assessment Visit:  Pre-Operative sleeve gastrectomy Surgery  Medical Nutrition Therapy:  Appt start time: 1100   End time:  1115  Pt returns for his third SWL appointment having lost 10 pounds since last visit. States he has been cutting back on portion sizes. He is trying not to skip breakfast and has increased his exercise to 5x a week. Patient reports he is struggling to cut back on lemonade.  Weight: 419 lbs BMI: 60.2  Preferred Learning Style:   No preference indicated   Learning Readiness:   Ready  Handouts given during visit include:  Pre-Op Goals Bariatric Surgery Protein Shakes  Physical activity: ADL's    Patient-Centered Goals: Goals: lose weight so he can lose trach/improve sleep apnea, be able to play with son  7 confidence/10 importance scale   Demonstrated degree of understanding via:  Teach Back  Teaching Method Utilized:  Visual Auditory Hands on  Barriers to learning/adherence to lifestyle change: none  Patient to [facility] the [facility] to enroll in Pre-Op and Post-Op Nutrition Education when surgery date is scheduled.

## 2016-05-04 ENCOUNTER — Ambulatory Visit: Payer: BLUE CROSS/BLUE SHIELD | Admitting: Dietician

## 2016-05-05 ENCOUNTER — Ambulatory Visit: Payer: BLUE CROSS/BLUE SHIELD | Admitting: Skilled Nursing Facility1

## 2016-05-09 ENCOUNTER — Other Ambulatory Visit: Payer: Self-pay | Admitting: *Deleted

## 2016-05-09 MED ORDER — FUROSEMIDE 80 MG PO TABS: 80.0000 mg | ORAL_TABLET | Freq: Four times a day (QID) | ORAL | 3 refills | Status: DC

## 2016-05-17 ENCOUNTER — Telehealth: Payer: Self-pay | Admitting: Internal Medicine

## 2016-05-17 ENCOUNTER — Emergency Department (HOSPITAL_BASED_OUTPATIENT_CLINIC_OR_DEPARTMENT_OTHER): Payer: Self-pay

## 2016-05-17 ENCOUNTER — Encounter (HOSPITAL_BASED_OUTPATIENT_CLINIC_OR_DEPARTMENT_OTHER): Payer: Self-pay | Admitting: *Deleted

## 2016-05-17 ENCOUNTER — Emergency Department (HOSPITAL_BASED_OUTPATIENT_CLINIC_OR_DEPARTMENT_OTHER)
Admission: EM | Admit: 2016-05-17 | Discharge: 2016-05-17 | Disposition: A | Discharge status: Home / Self Care | Payer: Self-pay | Attending: Emergency Medicine | Admitting: Emergency Medicine

## 2016-05-17 DIAGNOSIS — Z7984 Long term (current) use of oral hypoglycemic drugs: Secondary | ICD-10-CM | POA: Insufficient documentation

## 2016-05-17 DIAGNOSIS — I5033 Acute on chronic diastolic (congestive) heart failure: Secondary | ICD-10-CM | POA: Insufficient documentation

## 2016-05-17 DIAGNOSIS — R079 Chest pain, unspecified: Secondary | ICD-10-CM | POA: Insufficient documentation

## 2016-05-17 DIAGNOSIS — Z79899 Other long term (current) drug therapy: Secondary | ICD-10-CM | POA: Insufficient documentation

## 2016-05-17 DIAGNOSIS — E119 Type 2 diabetes mellitus without complications: Secondary | ICD-10-CM | POA: Insufficient documentation

## 2016-05-17 DIAGNOSIS — I110 Hypertensive heart disease with heart failure: Secondary | ICD-10-CM | POA: Insufficient documentation

## 2016-05-17 LAB — COMPREHENSIVE METABOLIC PANEL
ALBUMIN: 4 g/dL (ref 3.5–5.0)
ALT: 34 U/L (ref 17–63)
AST: 32 U/L (ref 15–41)
Alkaline Phosphatase: 125 U/L (ref 38–126)
Anion gap: 8 (ref 5–15)
BUN: 25 mg/dL — AB (ref 6–20)
CHLORIDE: 92 mmol/L — AB (ref 101–111)
CO2: 36 mmol/L — AB (ref 22–32)
CREATININE: 1.12 mg/dL (ref 0.61–1.24)
Calcium: 9.4 mg/dL (ref 8.9–10.3)
GFR calc Af Amer: >60 mL/min (ref >60)
GFR calc non Af Amer: >60 mL/min (ref >60)
GLUCOSE: 123 mg/dL — AB (ref 65–99)
POTASSIUM: 3.4 mmol/L — AB (ref 3.5–5.1)
SODIUM: 136 mmol/L (ref 135–145)
Total Bilirubin: 0.5 mg/dL (ref 0.3–1.2)
Total Protein: 8.3 g/dL — ABNORMAL HIGH (ref 6.5–8.1)

## 2016-05-17 LAB — CBC WITH DIFFERENTIAL/PLATELET
BASOS ABS: 0.1 10*3/uL (ref 0.0–0.1)
BASOS PCT: 1 %
EOS PCT: 1 %
Eosinophils Absolute: 0.1 10*3/uL (ref 0.0–0.7)
HCT: 39 % (ref 39.0–52.0)
Hemoglobin: 12.4 g/dL — ABNORMAL LOW (ref 13.0–17.0)
LYMPHS PCT: 24 %
Lymphs Abs: 2.3 10*3/uL (ref 0.7–4.0)
MCH: 29.6 pg (ref 26.0–34.0)
MCHC: 31.8 g/dL (ref 30.0–36.0)
MCV: 93.1 fL (ref 78.0–100.0)
MONO ABS: 0.6 10*3/uL (ref 0.1–1.0)
Monocytes Relative: 6 %
Neutro Abs: 6.6 10*3/uL (ref 1.7–7.7)
Neutrophils Relative %: 68 %
PLATELETS: 238 10*3/uL (ref 150–400)
RBC: 4.19 MIL/uL — AB (ref 4.22–5.81)
RDW: 13.6 % (ref 11.5–15.5)
WBC: 9.6 10*3/uL (ref 4.0–10.5)

## 2016-05-17 LAB — TROPONIN I: Troponin I: <0.03 ng/mL (ref <0.03)

## 2016-05-17 NOTE — ED Notes (Signed)
Pt back to room.

## 2016-05-17 NOTE — ED Triage Notes (Signed)
Pt c/o chest pain x 2 days, denies SOB or nausea

## 2016-05-17 NOTE — Telephone Encounter (Signed)
Patient Name: Derrick [name]  DOB: 1981/03/24    Initial Comment Caller states having chest pain, a lot of mucus, and pain in back of head   Nurse Assessment  Nurse: Scarlette ArStandifer, RN, Heather Date/Time (Eastern Time): 05/17/2016 1:45:22 PM  Confirm and document reason for call. If symptomatic, describe symptoms. You must click the next button to save text entered. ---Caller states having chest pain, and pain in back of head, it started yesterday, it is off and on.  Has the patient traveled out of the country within the last 30 days? ---Not Applicable  Does the patient have any new or worsening symptoms? ---Yes  Will a triage be completed? ---Yes  Related visit to physician within the last 2 weeks? ---No  Does the PT have any chronic conditions? (i.e. diabetes, asthma, etc.) ---Yes  List chronic conditions. ---See MR  Is this a behavioral health or substance abuse call? ---No     Guidelines    Guideline Title Affirmed Question Affirmed Notes  Chest Pain Patient sounds very sick or weak to the triager    Final Disposition User   Go to ED Now (or PCP triage) Scarlette ArStandifer, RN, Heather    [facility]  [facility]   Disagree/Comply: Comply

## 2016-05-17 NOTE — ED Provider Notes (Signed)
[facility] Provider Note   CSN: [id number] Arrival date & time: 05/17/16  1408     History   Chief Complaint Chief Complaint  Patient presents with  . Chest Pain    HPI Derrick [name] is a 35 y.o. male. He presents for evaluation of chest pain lasted for 2-3 minutes while he was at [employer].  States he was walking around [place] with his family. He states he felt like he had a lot of gas. He describes some pain in the center of his chest into the left that felt like "a gas bubble". He drank some soda and it seemed to go away. He estimates it lasted less than 10 minutes. He did not have associated sweats, nausea, difficult breathing lightheadedness syncope or other symptoms.  Patient has history of morbid obesity with pickwickian syndrome sleep apnea and tracheostomy pulmonary hypertension.  lso CHF, GERD, hypertension,  DM 2.  HPI  Past Medical History:  Diagnosis Date  . Acute on chronic diastolic CHF (congestive heart failure), NYHA class 1 ([place])   . Diabetes mellitus without complication ([place])   . GERD (gastroesophageal reflux disease)   . Hypertension   . Reflux     Patient Active Problem List   Diagnosis Date Noted  . Tracheostomy status ([place])   . Other allergic rhinitis   . Diabetes mellitus type 2 with complications ([place]) 73/71/0626  . Left ankle pain 08/11/2015  . Dysphagia   . Pulmonary hypertension   . Morbid obesity ([place]) 06/13/2015  . Generalized abdominal pain 06/13/2015  . Essential hypertension 06/13/2015  . OSA (obstructive sleep apnea) 06/13/2015  . Obesity hypoventilation syndrome ([place]) 06/13/2015  . Chronic diastolic heart failure ([place]) 06/13/2015  . CO2 retention 06/12/2015    Past Surgical History:  Procedure Laterality Date  . TRACHEOSTOMY TUBE PLACEMENT N/A 06/21/2015   Procedure: TRACHEOSTOMY;  Surgeon: Leta Baptist, MD;  Location: [facility] OR;  Service: ENT;  Laterality: N/A;       Home Medications    Prior to Admission  medications   Medication Sig Start Date End Date Taking? Authorizing Provider  albuterol (PROVENTIL HFA;VENTOLIN HFA) 108 (90 Base) MCG/ACT inhaler Inhale 2 puffs into the lungs every 6 (six) hours as needed for wheezing or shortness of breath. 03/06/16   Percell Miller Saguier, PA-C  allopurinol (ZYLOPRIM) 300 MG tablet Take 1 tablet (300 mg total) by mouth daily. 02/03/16   Hoyt Koch, MD  famotidine (PEPCID) 20 MG tablet Take 1 tablet (20 mg total) by mouth daily. 02/03/16   Hoyt Koch, MD  fluticasone (FLONASE) 50 MCG/ACT nasal spray Place 2 sprays into both nostrils daily. Patient not taking: Reported on 03/31/2016 09/16/15   Erick Colace, NP  fluticasone (FLOVENT HFA) 110 MCG/ACT inhaler Inhale 2 puffs into the lungs 2 (two) times daily. 12/15/15   Percell Miller Saguier, PA-C  furosemide (LASIX) 80 MG tablet Take 1 tablet (80 mg total) by mouth 4 (four) times daily. 05/09/16   Hoyt Koch, MD  gabapentin (NEURONTIN) 300 MG capsule Take 1 capsule (300 mg total) by mouth 4 (four) times daily. 02/03/16   Hoyt Koch, MD  glipiZIDE (GLUCOTROL XL) 10 MG 24 hr tablet Take 1 tablet (10 mg total) by mouth daily with breakfast. 03/31/16   Hoyt Koch, MD  levocetirizine (XYZAL ALLERGY 24HR) 5 MG tablet Take 1 tablet (5 mg total) by mouth every evening. 03/31/16   Hoyt Koch, MD  metFORMIN (GLUCOPHAGE) 500 MG tablet Take 1  tablet (500 mg total) by mouth daily with breakfast. 02/03/16   Hoyt Koch, MD  metoprolol (LOPRESSOR) 50 MG tablet Take 1 tablet (50 mg total) by mouth 2 (two) times daily. 02/03/16   Hoyt Koch, MD  nitroGLYCERIN (NITROSTAT) 0.4 MG SL tablet Place 0.4 mg under the tongue every 5 (five) minutes as needed for chest pain.    Historical Provider, MD  potassium chloride SA (K-DUR,KLOR-CON) 20 MEQ tablet Take 1 tablet (20 mEq total) by mouth 3 (three) times daily. 02/03/16   Hoyt Koch, MD  Tracheostomy Care KIT 30 kits by Does not apply  route daily. 09/27/15   Erick Colace, NP  triamterene-hydrochlorothiazide (MAXZIDE-25) 37.5-25 MG tablet Take 1 tablet by mouth daily. 03/31/16   Hoyt Koch, MD    Family History Family History  Problem Relation Age of Onset  . Diabetes Maternal Grandmother   . Hypertension Maternal Grandmother     Social History Social History  Substance Use Topics  . Smoking status: Never Smoker  . Smokeless tobacco: Never Used  . Alcohol use No     Allergies   Shrimp [shellfish allergy]; Vancomycin; and Zosyn [piperacillin sod-tazobactam so]   Review of Systems Review of Systems  Constitutional: Negative for appetite change, chills, diaphoresis, fatigue and fever.  HENT: Negative for mouth sores, sore throat and trouble swallowing.   Eyes: Negative for visual disturbance.  Respiratory: Positive for chest tightness. Negative for cough, shortness of breath and wheezing.   Cardiovascular: Negative for chest pain.  Gastrointestinal: Negative for abdominal distention, abdominal pain, diarrhea, nausea and vomiting.  Endocrine: Negative for polydipsia, polyphagia and polyuria.  Genitourinary: Negative for dysuria, frequency and hematuria.  Musculoskeletal: Negative for gait problem.  Skin: Negative for color change, pallor and rash.  Neurological: Negative for dizziness, syncope, light-headedness and headaches.  Hematological: Does not bruise/bleed easily.  Psychiatric/Behavioral: Negative for behavioral problems and confusion.     Physical Exam Updated Vital Signs BP 128/82   Pulse 91   Temp 98.2 F (36.8 C) (Oral)   Resp 20   Ht 5' 10" (1.778 m)   Wt (!) 419 lb (190.1 kg)   SpO2 95%   BMI 60.12 kg/m   Physical Exam  Constitutional: He is oriented to person, place, and time. He appears well-developed and well-nourished. No distress.  HENT:  Head: Normocephalic.  Eyes: Conjunctivae are normal. Pupils are equal, round, and reactive to light. No scleral icterus.  Neck:  Normal range of motion. Neck supple. No thyromegaly present.  Cardiovascular: Normal rate and regular rhythm.  Exam reveals no gallop and no friction rub.   No murmur heard. Regular rate and rhythm. Clear lungs. Tracheotomy site appears intact. No dyspnea.  Pulmonary/Chest: Effort normal and breath sounds normal. No respiratory distress. He has no wheezes. He has no rales.  Abdominal: Soft. Bowel sounds are normal. He exhibits no distension. There is no tenderness. There is no rebound.  Musculoskeletal: Normal range of motion.  Neurological: He is alert and oriented to person, place, and time.  Skin: Skin is warm and dry. No rash noted.  Psychiatric: He has a normal mood and affect. His behavior is normal.     ED Treatments / Results  Labs (all labs ordered are listed, but only abnormal results are displayed) Labs Reviewed  CBC WITH DIFFERENTIAL/PLATELET - Abnormal; Notable for the following:       Result Value   RBC 4.19 (*)    Hemoglobin 12.4 (*)  All other components within normal limits  COMPREHENSIVE METABOLIC PANEL - Abnormal; Notable for the following:    Potassium 3.4 (*)    Chloride 92 (*)    CO2 36 (*)    Glucose, Bld 123 (*)    BUN 25 (*)    Total Protein 8.3 (*)    All other components within normal limits  TROPONIN I  TROPONIN I    EKG  EKG Interpretation  Date/Time:  Wednesday May 17 2016 14:16:43 EDT Ventricular Rate:  107 PR Interval:  220 QRS Duration: 78 QT Interval:  316 QTC Calculation: 421 R Axis:   31 Text Interpretation:  Sinus tachycardia with 1st degree A-V block Low voltage QRS Borderline ECG No significant change since last tracing Confirmed by BEATON  MD, ROBERT ([id number]) on 05/17/2016 2:21:58 PM       Radiology Dg Chest 2 View  Result Date: 05/17/2016 CLINICAL DATA:  Pt c/o chest pain x 2 days EXAM: CHEST  2 VIEW COMPARISON:  03/06/2016 FINDINGS: Heart is mildly enlarged. No focal consolidations or pleural effusions. No pulmonary  edema. Tracheostomy tube appears unchanged. IMPRESSION: Mild cardiomegaly.  No evidence for acute pulmonary abnormality. Electronically Signed   By: Nolon Nations M.D.   On: 05/17/2016 15:01    Procedures Procedures (including critical care time)  Medications Ordered in ED Medications - No data to display   Initial Impression / Assessment and Plan / ED Course  I have reviewed the triage vital signs and the nursing notes.  Pertinent labs & imaging results that were available during my care of the patient were reviewed by me and considered in my medical decision making (see chart for details).  Clinical Course    Brief chest pain relieved with soda. Normal EKG normal initial, and delta troponin. Appropriate for discharge. Primary care follow-up. ER with worsening or recurrence.  Final Clinical Impressions(s) / ED Diagnoses   Final diagnoses:  Chest pain, unspecified type    New Prescriptions New Prescriptions   No medications on file     Tanna Furry, MD 05/17/16 [phone]

## 2016-05-17 NOTE — ED Notes (Signed)
Crackers, cheese and Ginger Ale provided to pt and visitor. Verified that pt can eat with MD.

## 2016-05-19 ENCOUNTER — Encounter: Payer: Self-pay | Attending: General Surgery | Admitting: Skilled Nursing Facility1

## 2016-05-19 DIAGNOSIS — Z01818 Encounter for other preprocedural examination: Secondary | ICD-10-CM | POA: Insufficient documentation

## 2016-05-19 NOTE — Patient Instructions (Addendum)
If your insurance covers strips for testing twice a day test before you eat anything in the morning and 2 hours after you have eaten a meal (does not matter the meal check at different times throughout the week)  If your insurance does not cover that many testing strips just change it up throughout the week  If your blood sugars are 200 or higher: drink water and go for a walk    -Look into getting a protein shake: premier protein  -Make sure your food is very moist and be sure to chew very well (30 chews per bite)   -To feel more satisfied have a fruit and vegetable with your protein shake

## 2016-05-19 NOTE — Progress Notes (Signed)
  Pre-Op Assessment Visit:  Pre-Operative sleeve gastrectomy Surgery  Medical Nutrition Therapy:  Appt start time: 1100   End time:  1115  Pt returns for SWL having gained about 4 pounds. Pt states he Had a ED visit due to chest pains which turned out to not be a concern. Pt states his blood sugars usually stay about 140 and below fastings. Pt states he has been drinking gingerale to settle his stomach. Pt states he has not been drinking lemonade he just puts lemon juice in it and he likes dropping some fruit in his drink. Pt states his exercise has been going well, walking better and better shoes with orthotics:30-45 minutes of chair exercises and walks down his road for about 20 minutes. Pt states he has been eating a lot of cucumbers.  Pt states his son was sick so he did not leave the house much resulting in some wt gain. Pt states he has not started protein shakes. Pt states he is still working on chewing. Pt states he has been eating a lot of spinach. Pt is worried about not having surgery before the new year.  Premier Protein sample given: expiration: sept. 11, 2018   Weight: 423.4 lbs BMI: 60.75  Preferred Learning Style:   No preference indicated   Learning Readiness:   Ready  Handouts given during visit include:  Pre-Op Goals Bariatric Surgery Protein Shakes  Physical activity: walking Goals: If your insurance covers strips for testing twice a day test before you eat anything in the morning and 2 hours after you have eaten a meal (does not matter the meal check at different times throughout the week)  If your insurance does not cover that many testing strips just change it up throughout the week  If your blood sugars are 200 or higher: drink water and go for a walk    -Look into getting a protein shake: premier protein  -Make sure your food is very moist and be sure to chew very well (30 chews per bite)   -To feel more satisfied have a fruit and vegetable with your  protein shake    Patient-Centered Goals: Goals: lose weight so he can lose trach/improve sleep apnea, be able to play with son  7 confidence/10 importance scale   Demonstrated degree of understanding via:  Teach Back  Teaching Method Utilized:  Visual Auditory Hands on  Barriers to learning/adherence to lifestyle change: none  [facility] to [facility] to enroll in Pre-Op and Post-Op Nutrition Education when surgery date is scheduled.

## 2016-05-26 IMAGING — CR DG CHEST 1V PORT
1 series · 1 of 1 positions shown · non-contrast
Comparison: June 29, 2015.

CLINICAL DATA: Respiratory failure.

EXAM:
PORTABLE CHEST 1 VIEW

[AP]
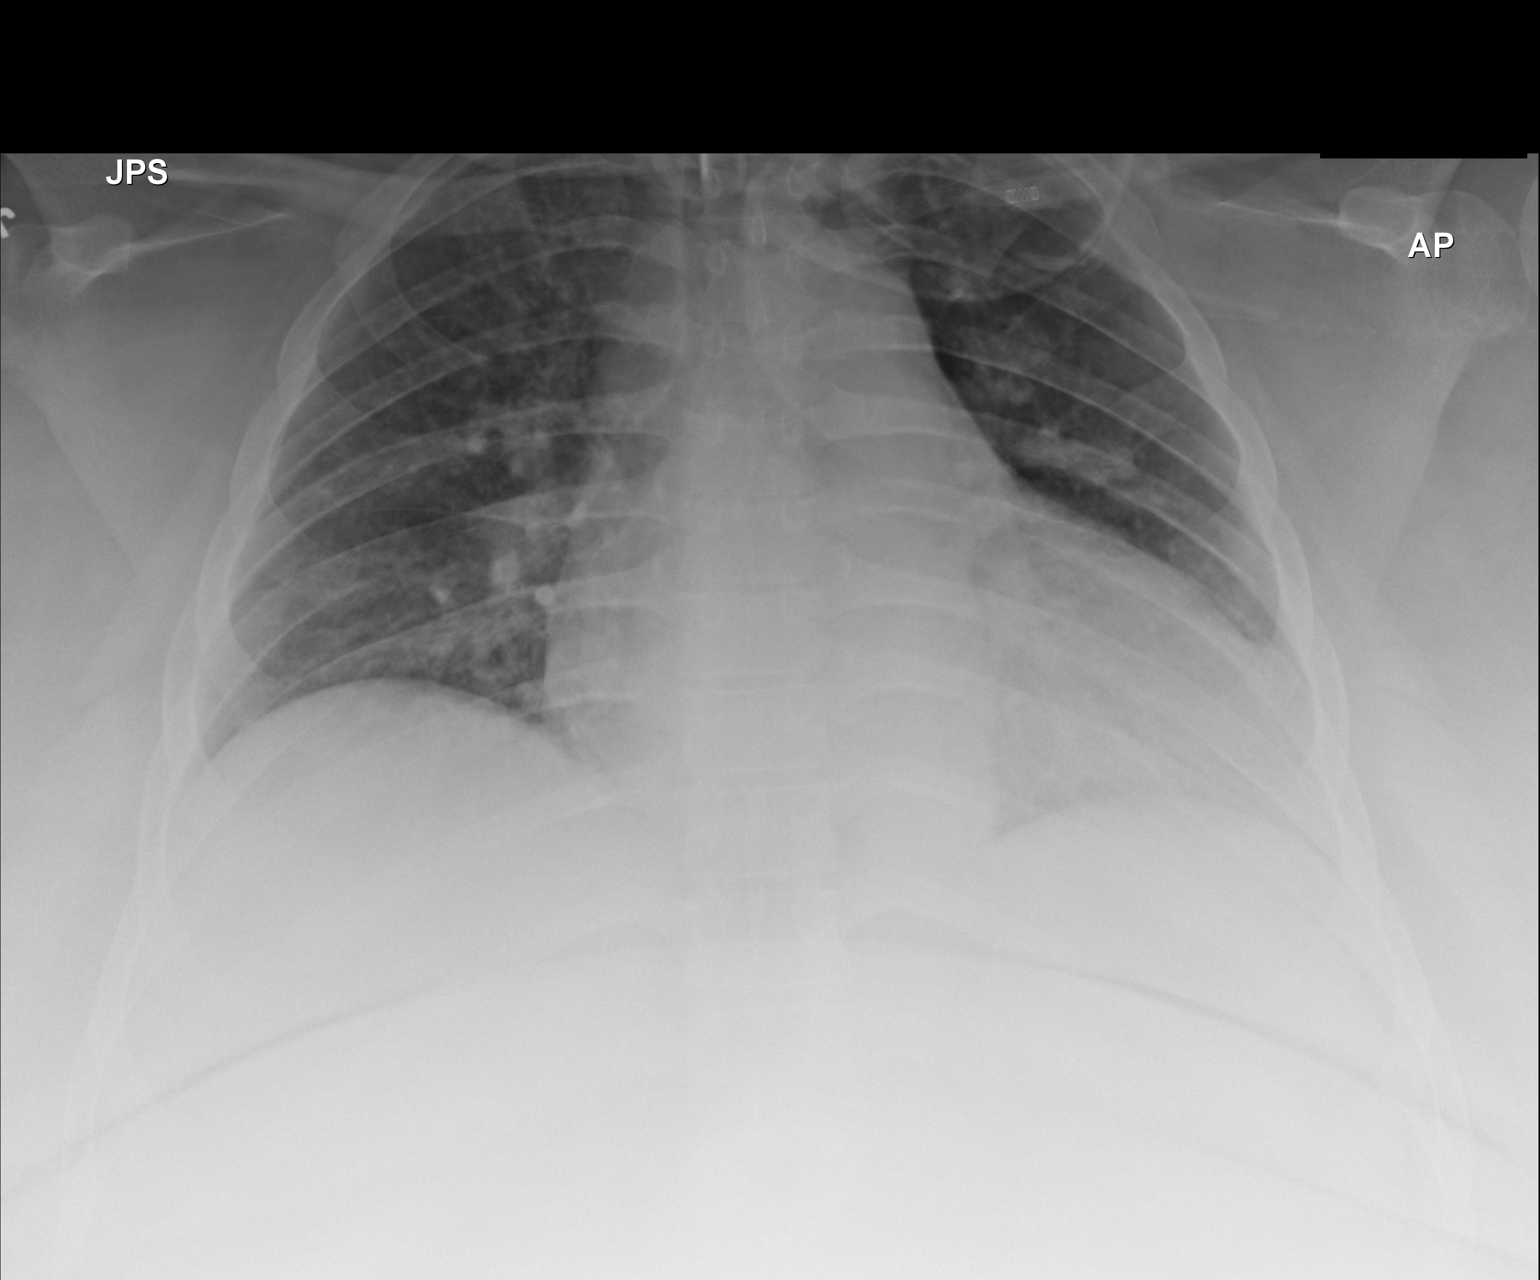

[1 of 1 positions shown; findings below may reference images not displayed]

FINDINGS: Stable cardiomegaly. Tracheostomy is in grossly good position. No
pneumothorax or pleural effusion is noted. No acute pulmonary
disease is noted. Bony thorax is intact.
IMPRESSION: No acute cardiopulmonary abnormality seen.

## 2016-05-30 ENCOUNTER — Ambulatory Visit: Payer: BLUE CROSS/BLUE SHIELD | Admitting: Internal Medicine

## 2016-06-02 ENCOUNTER — Ambulatory Visit (INDEPENDENT_AMBULATORY_CARE_PROVIDER_SITE_OTHER): Payer: Self-pay | Admitting: Internal Medicine

## 2016-06-02 ENCOUNTER — Encounter: Payer: Self-pay | Admitting: Internal Medicine

## 2016-06-02 DIAGNOSIS — N522 Drug-induced erectile dysfunction: Secondary | ICD-10-CM

## 2016-06-02 DIAGNOSIS — E662 Morbid (severe) obesity with alveolar hypoventilation: Secondary | ICD-10-CM

## 2016-06-02 DIAGNOSIS — N529 Male erectile dysfunction, unspecified: Secondary | ICD-10-CM | POA: Insufficient documentation

## 2016-06-02 MED ORDER — GABAPENTIN 300 MG PO CAPS: 300.0000 mg | ORAL_CAPSULE | Freq: Four times a day (QID) | ORAL | 3 refills | Status: DC

## 2016-06-02 MED ORDER — HYDROCODONE-HOMATROPINE 5-1.5 MG/5ML PO SYRP: 5.0000 mL | ORAL_SOLUTION | Freq: Three times a day (TID) | ORAL | 0 refills | Status: DC | PRN

## 2016-06-02 MED ORDER — PREDNISONE 20 MG PO TABS: 40.0000 mg | ORAL_TABLET | Freq: Every day | ORAL | 0 refills | Status: DC

## 2016-06-02 NOTE — Assessment & Plan Note (Signed)
With exacerbation today and rx for prednisone and hycodan cough syrup for the wheezing on exam. Needs follow up if not improved. Advised to use his flonase again and humidifier in the room.

## 2016-06-02 NOTE — Patient Instructions (Signed)
We have sent in the prednisone, take 2 pill daily for 5 days for the cough.   We have given you a prescription for the cough syrup.

## 2016-06-02 NOTE — Progress Notes (Signed)
Pre visit review using our clinic review tool, if applicable. No additional management support is needed unless otherwise documented below in the visit note. 

## 2016-06-02 NOTE — Progress Notes (Signed)
   Subjective:    Patient ID: Derrick [name], male    DOB: 09-21-1980, 35 y.o.   MRN: [id number]  HPI The patient is a 35 YO man coming in for 5 days of cough and sputum with SOB. He does have tracheostomy and this production is causing that to clog and he is having to switch more often. He is taking mucinex over the counter. He denies fevers but some chills. Mild sinus congestion and drainage.  Next concern is ED, worse lately. Some before his trach and hospital stay but is worse since being on all the new medicines. Not able to achieve adequate erection for penetration. Still has desire. Has not tried anything to help.   Review of Systems  Constitutional: Positive for activity change and chills. Negative for appetite change, fatigue, fever and unexpected weight change.  HENT: Positive for congestion, postnasal drip and rhinorrhea. Negative for ear discharge, ear pain, facial swelling, sinus pressure, sore throat and trouble swallowing.   Eyes: Negative.   Respiratory: Positive for cough and shortness of breath. Negative for wheezing.   Cardiovascular: Negative.   Gastrointestinal: Negative.   Genitourinary:       ED  Musculoskeletal: Negative.       Objective:   Physical Exam  Constitutional: He is oriented to person, place, and time. He appears well-developed and well-nourished.  Morbidly obese  HENT:  Head: Normocephalic and atraumatic.  Right Ear: External ear normal.  Left Ear: External ear normal.  Oropharynx with redness and drainage  Eyes: EOM are normal.  Neck: Normal range of motion.  Cardiovascular: Normal rate and regular rhythm.   Pulmonary/Chest: Effort normal. No respiratory distress. He has wheezes. He has no rales. He exhibits no tenderness.  Wheezing left upper lung  Abdominal: Soft. He exhibits no distension. There is no tenderness. There is no rebound.  Neurological: He is alert and oriented to person, place, and time.  Skin: Skin is warm and dry.    Vitals:   06/02/16 1328  BP: 116/80  Pulse: 93  Resp: 20  Temp: 98.2 F (36.8 C)  TempSrc: Oral  SpO2: 93%  Weight: (!) 429 lb (194.6 kg)  Height: 5\' 10"  (1.778 m)      Assessment & Plan:

## 2016-06-02 NOTE — Assessment & Plan Note (Signed)
Likely from metoprolol but he does not want to switch regimen. We talked about the fact that medications like viagra are not covered by insurance and when he heard the cost he declines this medication. Will monitor and treat when appropriate.

## 2016-06-09 ENCOUNTER — Ambulatory Visit: Payer: Self-pay | Admitting: Internal Medicine

## 2016-06-09 ENCOUNTER — Ambulatory Visit: Payer: Self-pay | Admitting: Skilled Nursing Facility1

## 2016-06-15 ENCOUNTER — Ambulatory Visit: Payer: Self-pay | Admitting: Internal Medicine

## 2016-06-16 ENCOUNTER — Encounter: Payer: Self-pay | Admitting: Internal Medicine

## 2016-06-16 ENCOUNTER — Ambulatory Visit (INDEPENDENT_AMBULATORY_CARE_PROVIDER_SITE_OTHER): Payer: BLUE CROSS/BLUE SHIELD | Admitting: Internal Medicine

## 2016-06-16 ENCOUNTER — Other Ambulatory Visit (INDEPENDENT_AMBULATORY_CARE_PROVIDER_SITE_OTHER): Payer: BLUE CROSS/BLUE SHIELD

## 2016-06-16 ENCOUNTER — Ambulatory Visit: Payer: Self-pay | Admitting: Skilled Nursing Facility1

## 2016-06-16 ENCOUNTER — Telehealth: Payer: Self-pay | Admitting: Internal Medicine

## 2016-06-16 VITALS — BP 120/84 | HR 99 | Temp 97.9°F | Resp 20 | Ht 70.0 in | Wt >= 6400 oz

## 2016-06-16 DIAGNOSIS — E662 Morbid (severe) obesity with alveolar hypoventilation: Secondary | ICD-10-CM

## 2016-06-16 DIAGNOSIS — E118 Type 2 diabetes mellitus with unspecified complications: Secondary | ICD-10-CM

## 2016-06-16 LAB — CBC
HCT: 39.5 % (ref 39.0–52.0)
Hemoglobin: 13.2 g/dL (ref 13.0–17.0)
MCHC: 33.3 g/dL (ref 30.0–36.0)
MCV: 88.5 fl (ref 78.0–100.0)
PLATELETS: 214 10*3/uL (ref 150.0–400.0)
RBC: 4.47 Mil/uL (ref 4.22–5.81)
RDW: 14.1 % (ref 11.5–15.5)
WBC: 13.4 10*3/uL — AB (ref 4.0–10.5)

## 2016-06-16 LAB — COMPREHENSIVE METABOLIC PANEL
ALBUMIN: 4.3 g/dL (ref 3.5–5.2)
ALK PHOS: 133 U/L — AB (ref 39–117)
ALT: 29 U/L (ref 0–53)
AST: 21 U/L (ref 0–37)
BILIRUBIN TOTAL: 0.7 mg/dL (ref 0.2–1.2)
BUN: 28 mg/dL — AB (ref 6–23)
CO2: 41 mEq/L — ABNORMAL HIGH (ref 19–32)
Calcium: 10.1 mg/dL (ref 8.4–10.5)
Chloride: 86 mEq/L — ABNORMAL LOW (ref 96–112)
Creatinine, Ser: 1.19 mg/dL (ref 0.40–1.50)
GFR: 89.97 mL/min (ref >60.00)
GLUCOSE: 309 mg/dL — AB (ref 70–99)
Potassium: 3.6 mEq/L (ref 3.5–5.1)
Sodium: 135 mEq/L (ref 135–145)
TOTAL PROTEIN: 8.2 g/dL (ref 6.0–8.3)

## 2016-06-16 LAB — HEMOGLOBIN A1C: HEMOGLOBIN A1C: 8.8 % — AB (ref 4.6–6.5)

## 2016-06-16 MED ORDER — DOXYCYCLINE HYCLATE 100 MG PO TABS: 100.0000 mg | ORAL_TABLET | Freq: Two times a day (BID) | ORAL | 0 refills | Status: DC

## 2016-06-16 NOTE — Progress Notes (Signed)
Pre visit review using our clinic review tool, if applicable. No additional management support is needed unless otherwise documented below in the visit note. 

## 2016-06-16 NOTE — Telephone Encounter (Signed)
Patient states that he seen Dr. Okey Duprerawford today and she was suppose to send scripts for cough syrup and nasal spray to [facility] on [address].

## 2016-06-16 NOTE — Patient Instructions (Signed)
We have sent in doxycycline for the cough. Take 1 pill twice a day for 10 days.   Keep taking the flonase and the xyzal for the allergies which are causing some of your symptoms.

## 2016-06-16 NOTE — Progress Notes (Signed)
   Subjective:    Patient ID: Derrick [name], male    DOB: 1980-10-29, 35 y.o.   MRN: [id number]  HPI The patient is a 35 YO man coming in for cough which did not improve after our last visit with prednisone. This is clogging his trach and he is having to change that more often. He denies fevers or chills. Some nasal symptoms. Sputum green, yellow, clear throughout the day. He is having some more SOB and limiting his activities. Has tried the prednisone and is taking xyzal and the nose spray without much benefit.   Review of Systems  Constitutional: Positive for activity change. Negative for appetite change, fatigue, fever and unexpected weight change.  HENT: Positive for congestion, postnasal drip and rhinorrhea. Negative for ear discharge, ear pain, sinus pain, sinus pressure, sore throat and voice change.   Eyes: Negative.   Respiratory: Positive for cough, chest tightness and shortness of breath. Negative for wheezing.   Cardiovascular: Negative.   Gastrointestinal: Negative.   Musculoskeletal: Negative.        Objective:   Physical Exam  Constitutional: He is oriented to person, place, and time. He appears well-developed and well-nourished.  Obese  HENT:  Head: Normocephalic and atraumatic.  Right Ear: External ear normal.  Left Ear: External ear normal.  Oropharynx with drainage, nose with redness no crusting.   Eyes: EOM are normal.  Neck: Normal range of motion.  Cardiovascular: Normal rate and regular rhythm.   Pulmonary/Chest: Effort normal. No respiratory distress. He has no wheezes. He has no rales.  Rhonchi coarse diffuse which partially clears with cough.   Abdominal: Soft.  Lymphadenopathy:    He has no cervical adenopathy.  Neurological: He is alert and oriented to person, place, and time.  Skin: Skin is warm and dry.    Vitals:   06/16/16 1344  BP: 120/84  Pulse: 99  Resp: 20  Temp: 97.9 F (36.6 C)  TempSrc: Oral  SpO2: 92%  Weight: (!) 420 lb  (190.5 kg)  Height: 5\' 10"  (1.778 m)      Assessment & Plan:

## 2016-06-17 NOTE — Assessment & Plan Note (Signed)
Rx for doxycycline to help with this exacerbation. Has not had benefit from prednisone.

## 2016-06-19 ENCOUNTER — Encounter: Payer: BLUE CROSS/BLUE SHIELD | Attending: General Surgery | Admitting: Skilled Nursing Facility1

## 2016-06-19 ENCOUNTER — Encounter: Payer: Self-pay | Admitting: Skilled Nursing Facility1

## 2016-06-19 DIAGNOSIS — Z01818 Encounter for other preprocedural examination: Secondary | ICD-10-CM | POA: Diagnosis not present

## 2016-06-19 DIAGNOSIS — E6609 Other obesity due to excess calories: Secondary | ICD-10-CM

## 2016-06-19 NOTE — Progress Notes (Signed)
  Pre-Op Assessment Visit:  Pre-Operative sleeve gastrectomy Surgery  Medical Nutrition Therapy:  Appt start time: 1100   End time:  1115  Pt returns for SWL having lost about 5 pounds. Pt states he has a Plan for thanksgiving. Pt states he Wants to be below 400 pounds before January first. Pts A1C has gone up to 8.8 and had many diabetes questions. Goals: u-Always bring your meter with you everywhere you go -Always Properly dispose of your needles:  -Discard in a hard plastic/metal container with a lid (something the needle can't puncture)  -Write Do Not Recycle on the outside of the container  -Example: A laundry detergent bottle -Never use the same needle more than once -Eat 2-3 carbohydrate choices for each meal and 1 for each snack -A meal: carbohydrates, protein, vegetable -A snack: A Fruit OR Vegetable AND Protein  -Try to be more active -Always pay attention to your body keeping watchful of possible low blood sugar (below 70) or high blood sugar (above 200)  Living with diabetes was given to the pt.  Weight: 418 lbs BMI: 59.98  Preferred Learning Style:   No preference indicated   Learning Readiness:   Ready  Handouts given during visit include:  Pre-Op Goals Bariatric Surgery Protein Shakes  Physical activity: walking Goals: If your insurance covers strips for testing twice a day test before you eat anything in the morning and 2 hours after you have eaten a meal (does not matter the meal check at different times throughout the week)  If your insurance does not cover that many testing strips just change it up throughout the week  If your blood sugars are 200 or higher: drink water and go for a walk    -Look into getting a protein shake: premier protein  -Make sure your food is very moist and be sure to chew very well (30 chews per bite)   -To feel more satisfied have a fruit and vegetable with your protein shake    Patient-Centered Goals: Goals: lose  weight so he can lose trach/improve sleep apnea, be able to play with son  7 confidence/10 importance scale   Demonstrated degree of understanding via:  Teach Back  Teaching Method Utilized:  Visual Auditory Hands on  Barriers to learning/adherence to lifestyle change: none  Patient to call the [facility] to enroll in Pre-Op and Post-Op Nutrition Education when surgery date is scheduled.

## 2016-06-19 NOTE — Telephone Encounter (Signed)
Tried to reach patient, left message.

## 2016-06-19 NOTE — Patient Instructions (Addendum)
u-Always bring your meter with you everywhere you go -Always Properly dispose of your needles:  -Discard in a hard plastic/metal container with a lid (something the needle can't puncture)  -Write Do Not Recycle on the outside of the container  -Example: A laundry detergent bottle -Never use the same needle more than once -Eat 2-3 carbohydrate choices for each meal and 1 for each snack -A meal: carbohydrates, protein, vegetable -A snack: A Fruit OR Vegetable AND Protein  -Try to be more active -Always pay attention to your body keeping watchful of possible low blood sugar (below 70) or high blood sugar (above 200)

## 2016-06-20 NOTE — Telephone Encounter (Signed)
Tried to reach patient a second time to see if he got the medicine he needed. The mailbox is full.

## 2016-06-21 ENCOUNTER — Other Ambulatory Visit: Payer: Self-pay | Admitting: Internal Medicine

## 2016-07-01 ENCOUNTER — Telehealth: Payer: Self-pay

## 2016-07-01 NOTE — Telephone Encounter (Signed)
Pt lmom rq test strips.  Contacted pt but vm was full.   RE: we need to know the brand of test strips to send.

## 2016-07-04 MED ORDER — GLUCOSE BLOOD VI STRP: 1.0000 | ORAL_STRIP | Freq: Two times a day (BID) | 5 refills | Status: DC

## 2016-07-04 MED ORDER — ACCU-CHEK NANO SMARTVIEW W/DEVICE KIT: PACK | 0 refills | Status: DC

## 2016-07-04 MED ORDER — ACCU-CHEK FASTCLIX LANCETS MISC: 5 refills | Status: DC

## 2016-07-04 NOTE — Addendum Note (Signed)
Addended by: Deatra JamesBRAND, LUCY M on: 07/04/2016 02:59 PM   Modules accepted: Orders

## 2016-07-04 NOTE — Telephone Encounter (Signed)
Rec'd fax stating pt is needing rx for Accu-chek Nano kit and supplies to check blood sugars. Faxed script to [facility]...Derrick [name]

## 2016-07-12 ENCOUNTER — Ambulatory Visit (HOSPITAL_COMMUNITY): Payer: BLUE CROSS/BLUE SHIELD

## 2016-07-19 ENCOUNTER — Inpatient Hospital Stay (HOSPITAL_COMMUNITY): Admission: RE | Admit: 2016-07-19 | Payer: BLUE CROSS/BLUE SHIELD | Source: Ambulatory Visit

## 2016-07-20 ENCOUNTER — Ambulatory Visit (HOSPITAL_COMMUNITY)
Admission: RE | Admit: 2016-07-20 | Discharge: 2016-07-20 | Disposition: A | Discharge status: Home / Self Care | Payer: BLUE CROSS/BLUE SHIELD | Source: Ambulatory Visit | Attending: Acute Care | Admitting: Acute Care

## 2016-07-20 DIAGNOSIS — Z430 Encounter for attention to tracheostomy: Secondary | ICD-10-CM | POA: Diagnosis not present

## 2016-07-20 NOTE — Progress Notes (Signed)
Tracheostomy Procedure Note  Mitzi HansenReginald A Greb [id number] 06/08/1981  Pre Procedure Tracheostomy Information  Trach Brand: Shiley Size: 5.0 Style: Proximal and Uncuffed Secured by: Velcro   Procedure: trach change    Post Procedure Tracheostomy Information  Trach Brand: Portex Size: 6.0 Style: Proximal and Uncuffed Secured by: Velcro   Post Procedure Evaluation:  ETCO2 positive color change from yellow to purple : Yes.   Vital signs:blood pressure 135/78, pulse 93, respirations 20 and pulse oximetry 93 % Patients current condition: stable Complications: No apparent complications Trach site exam: clean Wound care done: 4 x 4 gauze Patient did tolerate procedure well.   Education: Changed to bivona 6.0 extended length fixed neck.  Taught how to do trach care with new trach   Prescription needs: Extra trach     Additional needs: none

## 2016-07-21 ENCOUNTER — Ambulatory Visit: Payer: BLUE CROSS/BLUE SHIELD | Admitting: Skilled Nursing Facility1

## 2016-07-28 ENCOUNTER — Ambulatory Visit: Payer: BLUE CROSS/BLUE SHIELD | Admitting: Skilled Nursing Facility1

## 2016-08-03 ENCOUNTER — Other Ambulatory Visit: Payer: Self-pay | Admitting: Medical

## 2016-08-07 ENCOUNTER — Encounter: Payer: BLUE CROSS/BLUE SHIELD | Attending: General Surgery | Admitting: Skilled Nursing Facility1

## 2016-08-07 ENCOUNTER — Encounter: Payer: Self-pay | Admitting: Skilled Nursing Facility1

## 2016-08-07 DIAGNOSIS — Z01818 Encounter for other preprocedural examination: Secondary | ICD-10-CM | POA: Diagnosis not present

## 2016-08-07 DIAGNOSIS — E118 Type 2 diabetes mellitus with unspecified complications: Secondary | ICD-10-CM

## 2016-08-07 NOTE — Progress Notes (Signed)
  Pre-Op Assessment Visit:  Pre-Operative sleeve gastrectomy Surgery  Medical Nutrition Therapy:  Appt start time: 1100   End time:  1115  Pt arrives having gained 8 pounds. Pt states he has been very stressed lately. Pt states he thinks his DM is going well averaging 170 glucose readings maybe after he ate-he is not sure. Pt states he did have a reading of 190'something where he had eaten pancake and syrup. Pt states he had a different trach put in but now it seems he has to chew better.  Pt states he lives with his grandmother and states he has changed her cooking.   Goals: -Chew 30 times each bite -do not drink with meals -stay aware of your stress eating tendencies Living with diabetes was given to the pt.  Weight: 418 lbs BMI: 59.98  Preferred Learning Style:   No preference indicated   Learning Readiness:   Ready  Handouts given during visit include:  Pre-Op Goals Bariatric Surgery Protein Shakes  Physical activity: walking Goals: If your insurance covers strips for testing twice a day test before you eat anything in the morning and 2 hours after you have eaten a meal (does not matter the meal check at different times throughout the week)  If your insurance does not cover that many testing strips just change it up throughout the week  If your blood sugars are 200 or higher: drink water and go for a walk    -Look into getting a protein shake: premier protein  -Make sure your food is very moist and be sure to chew very well (30 chews per bite)   -To feel more satisfied have a fruit and vegetable with your protein shake    Patient-Centered Goals: Goals: lose weight so he can lose trach/improve sleep apnea, be able to play with son  7 confidence/10 importance scale   Demonstrated degree of understanding via:  Teach Back  Teaching Method Utilized:  Visual Auditory Hands on  Barriers to learning/adherence to lifestyle change: none  Patient to call the  [facility] to enroll in Pre-Op and Post-Op Nutrition Education when surgery date is scheduled.

## 2016-09-01 ENCOUNTER — Other Ambulatory Visit (INDEPENDENT_AMBULATORY_CARE_PROVIDER_SITE_OTHER): Payer: BLUE CROSS/BLUE SHIELD

## 2016-09-01 ENCOUNTER — Other Ambulatory Visit: Payer: Self-pay | Admitting: Internal Medicine

## 2016-09-01 ENCOUNTER — Encounter: Payer: Self-pay | Admitting: Internal Medicine

## 2016-09-01 ENCOUNTER — Ambulatory Visit (INDEPENDENT_AMBULATORY_CARE_PROVIDER_SITE_OTHER): Payer: BLUE CROSS/BLUE SHIELD | Admitting: Internal Medicine

## 2016-09-01 VITALS — BP 120/70 | HR 96 | Temp 98.1°F | Ht 70.0 in | Wt >= 6400 oz

## 2016-09-01 DIAGNOSIS — E118 Type 2 diabetes mellitus with unspecified complications: Secondary | ICD-10-CM | POA: Diagnosis not present

## 2016-09-01 DIAGNOSIS — J069 Acute upper respiratory infection, unspecified: Secondary | ICD-10-CM

## 2016-09-01 DIAGNOSIS — B9789 Other viral agents as the cause of diseases classified elsewhere: Secondary | ICD-10-CM

## 2016-09-01 LAB — HEMOGLOBIN A1C: Hgb A1c MFr Bld: 10.7 % — ABNORMAL HIGH (ref 4.6–6.5)

## 2016-09-01 LAB — COMPREHENSIVE METABOLIC PANEL
ALT: 32 U/L (ref 0–53)
AST: 25 U/L (ref 0–37)
Albumin: 4 g/dL (ref 3.5–5.2)
Alkaline Phosphatase: 163 U/L — ABNORMAL HIGH (ref 39–117)
BILIRUBIN TOTAL: 0.7 mg/dL (ref 0.2–1.2)
BUN: 22 mg/dL (ref 6–23)
CALCIUM: 9.8 mg/dL (ref 8.4–10.5)
CO2: 44 meq/L — AB (ref 19–32)
CREATININE: 1.13 mg/dL (ref 0.40–1.50)
Chloride: 85 mEq/L — ABNORMAL LOW (ref 96–112)
GFR: 95.39 mL/min (ref >60.00)
GLUCOSE: 305 mg/dL — AB (ref 70–99)
Potassium: 3.1 mEq/L — ABNORMAL LOW (ref 3.5–5.1)
SODIUM: 134 meq/L — AB (ref 135–145)
Total Protein: 8 g/dL (ref 6.0–8.3)

## 2016-09-01 LAB — LIPID PANEL
CHOL/HDL RATIO: 3
Cholesterol: 128 mg/dL (ref 0–200)
HDL: 43.8 mg/dL (ref >39.00)
LDL Cholesterol: 55 mg/dL (ref 0–99)
NONHDL: 83.8
TRIGLYCERIDES: 143 mg/dL (ref 0.0–149.0)
VLDL: 28.6 mg/dL (ref 0.0–40.0)

## 2016-09-01 MED ORDER — HYDROCODONE-HOMATROPINE 5-1.5 MG/5ML PO SYRP: 5.0000 mL | ORAL_SOLUTION | Freq: Three times a day (TID) | ORAL | 0 refills | Status: DC | PRN

## 2016-09-01 MED ORDER — CETIRIZINE HCL 10 MG PO TABS: 10.0000 mg | ORAL_TABLET | Freq: Every day | ORAL | 11 refills | Status: DC

## 2016-09-01 NOTE — Patient Instructions (Signed)
We have sent in the allergy medicine which should be cheaper called cetirizine.  We have given you the cough syrup medicine that you can use for cough.

## 2016-09-01 NOTE — Progress Notes (Signed)
   Subjective:    Patient ID: Derrick [name], male    DOB: November 12, 1980, 36 y.o.   MRN: [id number]  HPI The patient is a 36 YO man coming in for cold symptoms. He is having them for about 1 week or so. Started with cough and congestion. More thick drainage from his trach. No SOB. Mild cough. Mild nose congestion. Around sick contacts. Not taking anything currently. Tried mucinex but this was too strong for him. No fevers or chills.   Review of Systems  Constitutional: Negative for activity change, appetite change, chills, fatigue, fever and unexpected weight change.  HENT: Positive for congestion and postnasal drip. Negative for ear discharge, ear pain, rhinorrhea, sinus pain, sinus pressure and sore throat.   Respiratory: Positive for cough. Negative for chest tightness, shortness of breath and wheezing.   Cardiovascular: Negative.   Gastrointestinal: Negative.   Musculoskeletal: Negative.       Objective:   Physical Exam  Constitutional: He is oriented to person, place, and time. He appears well-developed and well-nourished.  HENT:  Head: Normocephalic and atraumatic.  Oropharynx with redness and mild drainage  Eyes: EOM are normal.  Neck: Normal range of motion.  Cardiovascular: Normal rate and regular rhythm.   Pulmonary/Chest: Effort normal and breath sounds normal. No respiratory distress. He has no wheezes. He has no rales.  Abdominal: Soft. He exhibits no distension. There is no tenderness. There is no rebound.  Neurological: He is alert and oriented to person, place, and time.  Skin: Skin is warm and dry.   Vitals:   09/01/16 1540  BP: 120/70  Pulse: 96  Temp: 98.1 F (36.7 C)  TempSrc: Oral  SpO2: 90%  Weight: (!) 424 lb (192.3 kg)  Height: 5\' 10"  (1.778 m)      Assessment & Plan:

## 2016-09-01 NOTE — Progress Notes (Signed)
Pre visit review using our clinic review tool, if applicable. No additional management support is needed unless otherwise documented below in the visit note. 

## 2016-09-03 DIAGNOSIS — J029 Acute pharyngitis, unspecified: Secondary | ICD-10-CM | POA: Insufficient documentation

## 2016-09-03 DIAGNOSIS — J069 Acute upper respiratory infection, unspecified: Secondary | ICD-10-CM | POA: Insufficient documentation

## 2016-09-03 DIAGNOSIS — B9789 Other viral agents as the cause of diseases classified elsewhere: Secondary | ICD-10-CM

## 2016-09-03 NOTE — Assessment & Plan Note (Signed)
Rx for cetirizine as he could not afford the xyzal. Can use otc medicines. Rx for hycodan for the cough for better sleeping. No indications for antibiotics or steroids.

## 2016-09-06 ENCOUNTER — Ambulatory Visit: Payer: BLUE CROSS/BLUE SHIELD | Admitting: Skilled Nursing Facility1

## 2016-09-18 ENCOUNTER — Ambulatory Visit (INDEPENDENT_AMBULATORY_CARE_PROVIDER_SITE_OTHER): Payer: BLUE CROSS/BLUE SHIELD | Admitting: Internal Medicine

## 2016-09-18 ENCOUNTER — Encounter: Payer: Self-pay | Admitting: Internal Medicine

## 2016-09-18 VITALS — BP 138/76 | HR 101 | Temp 97.9°F | Ht 70.0 in | Wt >= 6400 oz

## 2016-09-18 DIAGNOSIS — L989 Disorder of the skin and subcutaneous tissue, unspecified: Secondary | ICD-10-CM | POA: Diagnosis not present

## 2016-09-18 DIAGNOSIS — Z930 Tracheostomy status: Secondary | ICD-10-CM | POA: Diagnosis not present

## 2016-09-18 DIAGNOSIS — Z0000 Encounter for general adult medical examination without abnormal findings: Secondary | ICD-10-CM

## 2016-09-18 DIAGNOSIS — I872 Venous insufficiency (chronic) (peripheral): Secondary | ICD-10-CM | POA: Diagnosis not present

## 2016-09-18 DIAGNOSIS — E118 Type 2 diabetes mellitus with unspecified complications: Secondary | ICD-10-CM | POA: Diagnosis not present

## 2016-09-18 MED ORDER — GUAIFENESIN ER 600 MG PO TB12: 600.0000 mg | ORAL_TABLET | Freq: Two times a day (BID) | ORAL | 11 refills | Status: DC

## 2016-09-18 MED ORDER — DULAGLUTIDE 1.5 MG/0.5ML ~~LOC~~ SOAJ: 1.5000 mg | SUBCUTANEOUS | 6 refills | Status: DC

## 2016-09-18 NOTE — Patient Instructions (Signed)
We have sent in the trulicity for the sugars. This is the once a week injection. You can bring it to our office to get taught how to do this if needed otherwise they have videos on their website and the pharmacist can show you as well.   You need to keep taking the metformin as well to help the sugars as they are higher than they should be.   It is important to help with diet and exercise as well to bring the weight down to help with the sugars. Make sure to get the eyes checked as well.    Diabetes Mellitus and Standards of Medical Care Managing diabetes (diabetes mellitus) can be complicated. Your diabetes treatment may be managed by a team of health care providers, including:  A diet and nutrition specialist (registered dietitian).  A nurse.  A certified diabetes educator (CDE).  A diabetes specialist (endocrinologist).  An eye doctor.  A primary care provider.  A dentist. Your health care providers follow a schedule in order to help you get the best quality of care. The following schedule is a general guideline for your diabetes management plan. Your health care providers may also give you more specific instructions. HbA1c ( hemoglobin A1c) test This test provides information about blood sugar (glucose) control over the previous 2-3 months. It is used to check whether your diabetes management plan needs to be adjusted.  If you are meeting your treatment goals, this test is done at least 2 times a year.  If you are not meeting treatment goals or if your treatment goals have changed, this test is done 4 times a year. Blood pressure test  This test is done at every routine medical visit. For most people, the goal is less than 140/90. In some cases, your goal blood pressure may be 130/80 or less. Ask your health care provider what your goal blood pressure should be. Dental and eye exams  Visit your dentist two times a year.  If you have type 1 diabetes, get an eye exam 3-5 years  after you are diagnosed, and then once a year after your first exam.  If you were diagnosed with type 1 diabetes as a child, get an eye exam when you are age 28 or older and have had diabetes for 3-5 years. After the first exam, you should get an eye exam once a year.  If you have type 2 diabetes, have an eye exam as soon as you are diagnosed, and then once a year after your first exam. Foot care exam  Visual foot exams are done at every routine medical visit. The exams check for cuts, bruises, redness, blisters, sores, or other problems with the feet.  A complete foot exam is done by your health care provider once a year. This exam includes an inspection of the structure and skin of your feet, and a check of the pulses and sensation in your feet.  Type 1 diabetes: Get your first exam 3-5 years after diagnosis.  Type 2 diabetes: Get your first exam as soon as you are diagnosed.  Check your feet every day for cuts, bruises, redness, blisters, or sores. If you have any of these or other problems that are not healing, contact your health care provider. Kidney function test ( urine microalbumin)  This test is done once a year.  Type 1 diabetes: Get your first test 5 years after diagnosis.  Type 2 diabetes: Get your first test as soon as you are  diagnosed.  If you have chronic kidney disease (CKD), get a serum creatinine and estimated glomerular filtration rate (eGFR) test once a year. Lipid profile (cholesterol, HDL, LDL, triglycerides)  This test should be done when you are diagnosed with diabetes, and every 5 years after the first test. If you are on medicines to lower your cholesterol, you may need to get this test done every year.  The goal for LDL is less than 100 mg/dL (5.5 mmol/L). If you are at high risk, the goal is less than 70 mg/dL (3.9 mmol/L).  The goal for HDL is 40 mg/dL (2.2 mmol/L) for men and 50 mg/dL(2.8 mmol/L) for women. An HDL cholesterol of 60 mg/dL (3.3 mmol/L) or  higher gives some protection against heart disease.  The goal for triglycerides is less than 150 mg/dL (8.3 mmol/L). Immunizations  The yearly flu (influenza) vaccine is recommended for everyone 6 months or older who has diabetes.  The pneumonia (pneumococcal) vaccine is recommended for everyone 2 years or older who has diabetes. If you are 76 or older, you may get the pneumonia vaccine as a series of two separate shots.  The hepatitis B vaccine is recommended for adults shortly after they have been diagnosed with diabetes.  The Tdap (tetanus, diphtheria, and pertussis) vaccine should be given:  According to normal childhood vaccination schedules, for children.  Every 10 years, for adults who have diabetes.  The shingles vaccine is recommended for people who have had chicken pox and are 50 years or older. Mental and emotional health  Screening for symptoms of eating disorders, anxiety, and depression is recommended at the time of diagnosis and afterward as needed. If your screening shows that you have symptoms (you have a positive screening result), you may need further evaluation and be referred to a mental health care provider. Diabetes self-management education  Education about how to manage your diabetes is recommended at diagnosis and ongoing as needed. Treatment plan  Your treatment plan will be reviewed at every medical visit. Summary  Managing diabetes (diabetes mellitus) can be complicated. Your diabetes treatment may be managed by a team of health care providers.  Your health care providers follow a schedule in order to help you get the best quality of care.  Standards of care including having regular physical exams, blood tests, blood pressure monitoring, immunizations, screening tests, and education about how to manage your diabetes.  Your health care providers may also give you more specific instructions based on your individual health. This information is not  intended to replace advice given to you by your health care provider. Make sure you discuss any questions you have with your health care provider. Document Released: 05/14/2009 Document Revised: 04/14/2016 Document Reviewed: 04/14/2016 Elsevier Interactive Patient Education  2017 [employer]  Health Maintenance, Male A healthy lifestyle and preventative care can promote health and wellness.  Maintain regular health, dental, and eye exams.  Eat a healthy diet. Foods like vegetables, fruits, whole grains, low-fat dairy products, and lean protein foods contain the nutrients you need and are low in calories. Decrease your intake of foods high in solid fats, added sugars, and salt. Get information about a proper diet from your health care provider, if necessary.  Regular physical exercise is one of the most important things you can do for your health. Most adults should get at least 150 minutes of moderate-intensity exercise (any activity that increases your heart rate and causes you to sweat) each week. In addition, most adults need muscle-strengthening  exercises on 2 or more days a week.   Maintain a healthy weight. The body mass index (BMI) is a screening tool to identify possible weight problems. It provides an estimate of body fat based on height and weight. Your health care provider can find your BMI and can help you achieve or maintain a healthy weight. For males 20 years and older:  A BMI below 18.5 is considered underweight.  A BMI of 18.5 to 24.9 is normal.  A BMI of 25 to 29.9 is considered overweight.  A BMI of 30 and above is considered obese.  Maintain normal blood lipids and cholesterol by exercising and minimizing your intake of saturated fat. Eat a balanced diet with plenty of fruits and vegetables. Blood tests for lipids and cholesterol should begin at age 13 and be repeated every 5 years. If your lipid or cholesterol levels are high, you are over age 11, or you are at high  risk for heart disease, you may need your cholesterol levels checked more frequently.Ongoing high lipid and cholesterol levels should be treated with medicines if diet and exercise are not working.  If you smoke, find out from your health care provider how to quit. If you do not use tobacco, do not start.  Lung cancer screening is recommended for adults aged 55-80 years who are at high risk for developing lung cancer because of a history of smoking. A yearly low-dose CT scan of the lungs is recommended for people who have at least a 30-pack-year history of smoking and are current smokers or have quit within the past 15 years. A pack year of smoking is smoking an average of 1 pack of cigarettes a day for 1 year (for example, a 30-pack-year history of smoking could mean smoking 1 pack a day for 30 years or 2 packs a day for 15 years). Yearly screening should continue until the smoker has stopped smoking for at least 15 years. Yearly screening should be stopped for people who develop a health problem that would prevent them from having lung cancer treatment.  If you choose to drink alcohol, do not have more than 2 drinks per day. One drink is considered to be 12 oz (360 mL) of beer, 5 oz (150 mL) of wine, or 1.5 oz (45 mL) of liquor.  Avoid the use of street drugs. Do not share needles with anyone. Ask for help if you need support or instructions about stopping the use of drugs.  High blood pressure causes heart disease and increases the risk of stroke. High blood pressure is more likely to develop in:  People who have blood pressure in the end of the normal range (100-139/85-89 mm Hg).  People who are overweight or obese.  People who are African American.  If you are 24-57 years of age, have your blood pressure checked every 3-5 years. If you are 56 years of age or older, have your blood pressure checked every year. You should have your blood pressure measured twice-once when you are at a hospital  or clinic, and once when you are not at a hospital or clinic. Record the average of the two measurements. To check your blood pressure when you are not at a hospital or clinic, you can use:  An automated blood pressure machine at a pharmacy.  A home blood pressure monitor.  If you are 56-44 years old, ask your health care provider if you should take aspirin to prevent heart disease.  Diabetes screening involves taking  a blood sample to check your fasting blood sugar level. This should be done once every 3 years after age 21 if you are at a normal weight and without risk factors for diabetes. Testing should be considered at a younger age or be carried out more frequently if you are overweight and have at least 1 risk factor for diabetes.  Colorectal cancer can be detected and often prevented. Most routine colorectal cancer screening begins at the age of 56 and continues through age 86. However, your health care provider may recommend screening at an earlier age if you have risk factors for colon cancer. On a yearly basis, your health care provider may provide home test kits to check for hidden blood in the stool. A small camera at the end of a tube may be used to directly examine the colon (sigmoidoscopy or colonoscopy) to detect the earliest forms of colorectal cancer. Talk to your health care provider about this at age 52 when routine screening begins. A direct exam of the colon should be repeated every 5-10 years through age 60, unless early forms of precancerous polyps or small growths are found.  People who are at an increased risk for hepatitis B should be screened for this virus. You are considered at high risk for hepatitis B if:  You were born in a country where hepatitis B occurs often. Talk with your health care provider about which countries are considered high risk.  Your parents were born in a high-risk country and you have not received a shot to protect against hepatitis B (hepatitis B  vaccine).  You have HIV or AIDS.  You use needles to inject street drugs.  You live with, or have sex with, someone who has hepatitis B.  You are a man who has sex with other men (MSM).  You get hemodialysis treatment.  You take certain medicines for conditions like cancer, organ transplantation, and autoimmune conditions.  Hepatitis C blood testing is recommended for all people born from [birth year] through 1964 and any individual with known risk factors for hepatitis C.  Healthy men should no longer receive prostate-specific antigen (PSA) blood tests as part of routine cancer screening. Talk to your health care provider about prostate cancer screening.  Testicular cancer screening is not recommended for adolescents or adult males who have no symptoms. Screening includes self-exam, a health care provider exam, and other screening tests. Consult with your health care provider about any symptoms you have or any concerns you have about testicular cancer.  Practice safe sex. Use condoms and avoid high-risk sexual practices to reduce the spread of sexually transmitted infections (STIs).  You should be screened for STIs, including gonorrhea and chlamydia if:  You are sexually active and are younger than 24 years.  You are older than 24 years, and your health care provider tells you that you are at risk for this type of infection.  Your sexual activity has changed since you were last screened, and you are at an increased risk for chlamydia or gonorrhea. Ask your health care provider if you are at risk.  If you are at risk of being infected with HIV, it is recommended that you take a prescription medicine daily to prevent HIV infection. This is called pre-exposure prophylaxis (PrEP). You are considered at risk if:  You are a man who has sex with other men (MSM).  You are a heterosexual man who is sexually active with multiple partners.  You take drugs by injection.  You  are sexually active  with a partner who has HIV.  Talk with your health care provider about whether you are at high risk of being infected with HIV. If you choose to begin PrEP, you should first be tested for HIV. You should then be tested every 3 months for as long as you are taking PrEP.  Use sunscreen. Apply sunscreen liberally and repeatedly throughout the day. You should seek shade when your shadow is shorter than you. Protect yourself by wearing long sleeves, pants, a wide-brimmed hat, and sunglasses year round whenever you are outdoors.  Tell your health care provider of new moles or changes in moles, especially if there is a change in shape or color. Also, tell your health care provider if a mole is larger than the size of a pencil eraser.  A one-time screening for abdominal aortic aneurysm (AAA) and surgical repair of large AAAs by ultrasound is recommended for men aged 56-75 years who are current or former smokers.  Stay current with your vaccines (immunizations). This information is not intended to replace advice given to you by your health care provider. Make sure you discuss any questions you have with your health care provider. Document Released: 01/13/2008 Document Revised: 08/07/2014 Document Reviewed: 04/20/2015 Elsevier Interactive Patient Education  2017 [employer].

## 2016-09-18 NOTE — Progress Notes (Signed)
Pre visit review using our clinic review tool, if applicable. No additional management support is needed unless otherwise documented below in the visit note. 

## 2016-09-18 NOTE — Progress Notes (Signed)
   Subjective:    Patient ID: Derrick [name], male    DOB: 1980/08/13, 36 y.o.   MRN: [id number]  HPI The patient is a 36 YO man coming in for wellness. Several concerns.   [facility], [facility] reviewed and updated.   Review of Systems  Constitutional: Negative for activity change, appetite change, diaphoresis, fatigue, fever and unexpected weight change.  HENT: Positive for congestion and voice change. Negative for drooling, ear discharge, ear pain, postnasal drip, rhinorrhea, sinus pain, sinus pressure, sore throat and trouble swallowing.   Eyes: Negative.   Respiratory: Positive for cough. Negative for chest tightness, shortness of breath and wheezing.   Cardiovascular: Positive for leg swelling. Negative for chest pain and palpitations.  Gastrointestinal: Negative for abdominal distention, abdominal pain, constipation, diarrhea and nausea.  Musculoskeletal: Positive for myalgias. Negative for arthralgias, back pain and gait problem.  Skin: Positive for rash.  Neurological: Positive for numbness. Negative for dizziness, tremors, seizures, syncope, facial asymmetry and weakness.  Psychiatric/Behavioral: Negative.       Objective:   Physical Exam  Constitutional: He is oriented to person, place, and time.  Obese  HENT:  Head: Normocephalic and atraumatic.  Trach in place, some congestion and oropharynx with clear drainage.   Eyes: EOM are normal.  Neck: Normal range of motion. No JVD present.  Cardiovascular: Normal rate and regular rhythm.   Pulmonary/Chest: Effort normal and breath sounds normal. No respiratory distress. He has no wheezes.  Exam limited by habitus  Abdominal: Soft. Bowel sounds are normal. He exhibits no distension. There is no tenderness. There is no rebound.  Musculoskeletal: He exhibits edema.  Stable edema in the legs  Lymphadenopathy:    He has no cervical adenopathy.  Neurological: He is alert and oriented to person, place, and time.  Coordination normal.  Skin: Skin is warm and dry.  Acne on the face  Psychiatric: He has a normal mood and affect.   Vitals:   09/18/16 1305  BP: 138/76  Pulse: (!) 101  Temp: 97.9 F (36.6 C)  TempSrc: Oral  SpO2: 92%  Weight: (!) 432 lb (196 kg)  Height: 5\' 10"  (1.778 m)      Assessment & Plan:

## 2016-09-21 ENCOUNTER — Encounter: Payer: Self-pay | Admitting: Internal Medicine

## 2016-09-21 DIAGNOSIS — Z0000 Encounter for general adult medical examination without abnormal findings: Secondary | ICD-10-CM | POA: Insufficient documentation

## 2016-09-21 DIAGNOSIS — L709 Acne, unspecified: Secondary | ICD-10-CM | POA: Insufficient documentation

## 2016-09-21 NOTE — Assessment & Plan Note (Signed)
Needs referral to ENT for ongoing congestion. Rx sent in for mucinex to help thin secretions. Trach site in tact but he is having to change more often due to secretions.

## 2016-09-21 NOTE — Assessment & Plan Note (Signed)
Pneumonia and tetanus shot up to date. Declines flu shot. Foot exam done but needs eye exam. His health is not in good shape and he was counseled on the needed changes to help improve his health. Given screening recommendations.

## 2016-09-21 NOTE — Assessment & Plan Note (Signed)
Will add trulicity for better control as his sugars are out of control. Talked to him about the seriousness of the sugar levels and the possible need for insulin if he cannot tolerate medications for it. He is taking metformin (not full dose due to diarrhea) and amaryl and adding trulicity. Complicated by some neuropathy in his legs which is worsening with poor control.

## 2016-09-21 NOTE — Assessment & Plan Note (Signed)
Referral to dermatology for the acne on his face.

## 2016-09-26 ENCOUNTER — Other Ambulatory Visit: Payer: Self-pay | Admitting: Internal Medicine

## 2016-09-27 ENCOUNTER — Ambulatory Visit: Payer: Self-pay | Admitting: Podiatry

## 2016-10-03 ENCOUNTER — Telehealth: Payer: Self-pay

## 2016-10-03 ENCOUNTER — Telehealth: Payer: Self-pay | Admitting: Internal Medicine

## 2016-10-03 NOTE — Telephone Encounter (Signed)
PA started via covermymeds. Key: [id number]

## 2016-10-03 NOTE — Telephone Encounter (Signed)
He was going to see ENT for the congestion and does not need a chest x-ray.

## 2016-10-03 NOTE — Telephone Encounter (Signed)
Patient states he thought you wanted him to do a chest xray after this last visit. Can you please advise if he needs this done?  Also he states he needs his test strips sent in to the pharmacy.   Thank you!!

## 2016-10-04 ENCOUNTER — Telehealth: Payer: Self-pay

## 2016-10-04 NOTE — Telephone Encounter (Signed)
Called, mail box is full could not leave message.

## 2016-10-04 NOTE — Telephone Encounter (Signed)
Test strips for one touch verio meter sent in

## 2016-10-04 NOTE — Telephone Encounter (Signed)
Patient informed. 

## 2016-10-04 NOTE — Telephone Encounter (Signed)
Tried contacting patient due to ins not covering one touch verio test strips, patient did not answer and mailbox was full so unable to leave message. Sent message to pharmacy stating that patient needs to contact insurance and find out what meter and strips his insurance will cover and let us know so we can call in, will not do PA for one touch verio strips.

## 2016-10-05 ENCOUNTER — Telehealth: Payer: Self-pay

## 2016-10-05 ENCOUNTER — Ambulatory Visit: Payer: Self-pay | Admitting: Podiatry

## 2016-10-05 NOTE — Telephone Encounter (Signed)
PA for trulicity was denied, stated patient needed to try taking victoza first since that is covered by formulary.

## 2016-10-06 NOTE — Telephone Encounter (Signed)
PA denied. New phone started for alternative and sent to PCP.

## 2016-10-09 MED ORDER — LIRAGLUTIDE 18 MG/3ML ~~LOC~~ SOPN
1.8000 mg | PEN_INJECTOR | Freq: Every day | SUBCUTANEOUS | 6 refills | Status: DC
Start: 2016-10-09 — End: 2017-11-27

## 2016-10-09 NOTE — Addendum Note (Signed)
Addended by: Hillard DankerRAWFORD, ELIZABETH A on: 10/09/2016 11:11 AM   Modules accepted: Orders

## 2016-10-09 NOTE — Telephone Encounter (Signed)
Sent in victoza which is very similar but daily medication for him to try.

## 2016-10-09 NOTE — Telephone Encounter (Signed)
LVM with new medication and to call back if he has any questions

## 2016-10-16 ENCOUNTER — Other Ambulatory Visit: Payer: Self-pay | Admitting: *Deleted

## 2016-10-16 MED ORDER — BAYER CONTOUR NEXT MONITOR W/DEVICE KIT: PACK | 0 refills | Status: DC

## 2016-10-16 MED ORDER — BAYER MICROLET LANCETS MISC: 3 refills | Status: DC

## 2016-10-16 MED ORDER — GLUCOSE BLOOD VI STRP: 1.0000 | ORAL_STRIP | Freq: Two times a day (BID) | 3 refills | Status: DC

## 2016-10-16 NOTE — Telephone Encounter (Signed)
Rec'd fax stating one touch devices/supplies are not covered by insurance plan. Alternative is contour Next. pls send script for monitor w/supplies. Updated chart sent scripts to [facility]...Raechel Chute/lmb

## 2016-11-01 ENCOUNTER — Other Ambulatory Visit: Payer: Self-pay | Admitting: Internal Medicine

## 2016-11-09 ENCOUNTER — Ambulatory Visit (HOSPITAL_COMMUNITY)
Admission: RE | Admit: 2016-11-09 | Discharge: 2016-11-09 | Disposition: A | Discharge status: Home / Self Care | Payer: BLUE CROSS/BLUE SHIELD | Source: Ambulatory Visit | Attending: Acute Care | Admitting: Acute Care

## 2016-11-09 DIAGNOSIS — Z430 Encounter for attention to tracheostomy: Secondary | ICD-10-CM | POA: Insufficient documentation

## 2016-11-09 NOTE — Progress Notes (Signed)
Tracheostomy Procedure Note  TYRE BEAVER [id number] [birth year]  Pre Procedure Tracheostomy Information  Trach Brand: Bivona Size: 6xl Style: Uncuffed Secured by: Velcro   Procedure: trach change to #6 Portex    Post Procedure Tracheostomy Information  Trach Brand: Portex Size: 6 Style: Uncuffed Secured by: Velcro   Post Procedure Evaluation:  ETCO2 positive color change from yellow to purple : Yes.   Vital signs:blood pressure 140/65, pulse 99, respirations 18 and pulse oximetry 96% on RA Patients current condition: stable Complications: No apparent complications Trach site exam: clean, dry Wound care done: 4 x 4 gauze Patient did tolerate procedure well.   Education: Keep trach capped with red button as much as possible during the day. Humidity through ATC at night.  See back in 8 weeks

## 2016-11-09 NOTE — Progress Notes (Addendum)
   Subjective:  Here for routine follow up    Patient ID: Derrick [name], male    DOB: [birth year], 36 y.o.   MRN: [id number]  HPI  Derrick [name] is well known to me. He returns to trach clinic for routine trach care. We recently changed him to a 6 xl bivona. Since we did this he has had trouble with initially positional cough and now he reports he feels like it is clogged off/plugged. Since our last visit he is still progressing well. He sings in church, plays with his son, continues to lose weight and overall is feeling better. He tells me that he has just one more visit w/ psychiatry to meet the requirements for gastric bypass surgery which he still would like to consider. He is asking today about removing the trach and if/when we could consider this.    Review of Systems  Constitutional: Negative.   HENT:       Having trouble w/ bivona trach. Feels like it gets plugged off. Not as comfortable.   Eyes: Negative.   Respiratory: Negative.   Cardiovascular: Negative.   Gastrointestinal: Negative.   Endocrine: Negative.   Genitourinary: Negative.   Musculoskeletal: Negative.   Skin: Negative.   Neurological: Negative.   Hematological: Negative.   Psychiatric/Behavioral: Negative.     140/65, pulse 99, respirations 18 and pulse oximetry 96% on RA Objective:   Physical Exam  Constitutional: He is oriented to person, place, and time. He appears well-developed and well-nourished. No distress.  HENT:  Head: Normocephalic and atraumatic.  Mouth/Throat: No oropharyngeal exudate.  Eyes: EOM are normal. Pupils are equal, round, and reactive to light. Right eye exhibits no discharge.  Neck: Normal range of motion. Neck supple. No thyromegaly present.  Cardiovascular: Normal rate, regular rhythm and normal heart sounds.   Pulmonary/Chest: Breath sounds normal. No respiratory distress.  Abdominal: Soft. Bowel sounds are normal. He exhibits no distension. There is no tenderness.   Musculoskeletal: Normal range of motion. He exhibits no edema or deformity.  Neurological: He is alert and oriented to person, place, and time.  Skin: Skin is warm and dry. He is not diaphoretic.  Psychiatric: He has a normal mood and affect. His behavior is normal. Thought content normal.   Trach changed:  Trach Brand: Portex Size: 6 Style: Uncuffed Secured by: Velcro   Assessment & Plan:  Trach dep d/t severe OSA and OHS -has had sig weight loss.  -hoping that he will get to go for gastric bypass surgery in near future.  -wants to have trach removed eventually and I think with his weight loss, age, activity level this is a realistic goal. If he is going to have surgery we should get him thru this first.  Plan Trach changed ROV 3 months  Cont routine care   My time was 35 minutes   [name] ACNP-BC [facility] Pulmonary/Critical Care Pager # [phone] OR # [phone] if no answer

## 2016-11-13 ENCOUNTER — Telehealth: Payer: Self-pay | Admitting: Acute Care

## 2016-11-15 DIAGNOSIS — Z930 Tracheostomy status: Secondary | ICD-10-CM | POA: Insufficient documentation

## 2016-11-21 ENCOUNTER — Other Ambulatory Visit: Payer: Self-pay | Admitting: Internal Medicine

## 2016-11-23 ENCOUNTER — Encounter: Payer: Self-pay | Admitting: Internal Medicine

## 2016-11-23 ENCOUNTER — Ambulatory Visit (INDEPENDENT_AMBULATORY_CARE_PROVIDER_SITE_OTHER): Payer: BLUE CROSS/BLUE SHIELD | Admitting: Internal Medicine

## 2016-11-23 DIAGNOSIS — J3089 Other allergic rhinitis: Secondary | ICD-10-CM

## 2016-11-23 MED ORDER — METOPROLOL TARTRATE 50 MG PO TABS: 50.0000 mg | ORAL_TABLET | Freq: Two times a day (BID) | ORAL | 3 refills | Status: DC

## 2016-11-23 MED ORDER — MONTELUKAST SODIUM 10 MG PO TABS: 10.0000 mg | ORAL_TABLET | Freq: Every day | ORAL | 3 refills | Status: DC

## 2016-11-23 MED ORDER — GLIPIZIDE ER 10 MG PO TB24: 10.0000 mg | ORAL_TABLET | Freq: Every day | ORAL | 3 refills | Status: DC

## 2016-11-23 MED ORDER — RANITIDINE HCL 150 MG PO CAPS: 150.0000 mg | ORAL_CAPSULE | Freq: Two times a day (BID) | ORAL | 11 refills | Status: DC

## 2016-11-23 NOTE — Assessment & Plan Note (Signed)
He is still recommended to call [facility] for visit as this was part of the reason for referral and he should contact us before deciding not to follow through. He is taking zyrtec and flonase. Added singulair to regimen today. He is also using mucinex.

## 2016-11-23 NOTE — Progress Notes (Signed)
   Subjective:    Patient ID: Derrick [name], male    DOB: 02/22/1981, 36 y.o.   MRN: [id number]  HPI The patient is a 36 YO male coming in for cough and chest congestion. He just went to trach clinic and will change on 11/09/16. He was then called in doxycycline 8 day supply by them on 11/13/16 which he is currently taking and still has 2 days left. He is having nose drainage and cough with some sputum. He did not ever see ENT as his trach clinic advised him that he does not need to go. He is taking zyrtec and flonase most of the time for allergies. He is still having some nose drainage. This impairs his taste and smell some of the time. Denies fevers or chills. No SOB or cough. Some drainage from his trach but not much. Feels like he is improving overall on the doxycycline.   Review of Systems  Constitutional: Positive for appetite change. Negative for activity change, chills, fatigue, fever and unexpected weight change.  HENT: Positive for congestion, postnasal drip and rhinorrhea. Negative for ear discharge, ear pain, sinus pain, sinus pressure, sore throat, tinnitus and trouble swallowing.   Eyes: Negative.   Respiratory: Positive for cough. Negative for chest tightness, shortness of breath and wheezing.   Cardiovascular: Negative.   Gastrointestinal: Negative.       Objective:   Physical Exam  Constitutional: He appears well-developed and well-nourished.  Obese  HENT:  Head: Normocephalic and atraumatic.  Oropharynx with redness and clear drainage, nose without crusting  Eyes: EOM are normal.  Neck: Normal range of motion. No JVD present.  Cardiovascular: Normal rate and regular rhythm.   Pulmonary/Chest: Effort normal and breath sounds normal. No respiratory distress. He has no wheezes. He has no rales.  Abdominal: Soft.  Lymphadenopathy:    He has no cervical adenopathy.  Neurological: Coordination normal.  Skin: Skin is warm and dry.   Vitals:   11/23/16 0852  BP:  (!) 142/86  Pulse: 98  Resp: 14  Temp: 98.1 F (36.7 C)  TempSrc: Oral  Weight: (!) 418 lb (189.6 kg)  Height:  (1.778 m)      Assessment & Plan:

## 2016-11-23 NOTE — Progress Notes (Signed)
Pre visit review using our clinic review tool, if applicable. No additional management support is needed unless otherwise documented below in the visit note. 

## 2016-11-23 NOTE — Patient Instructions (Signed)
We have sent in the refills.  We have also sent in a new medicine called singulair that will help with the drainage and allergies some more. Take 1 pill daily and keep taking the zyrtec and flonase as well.

## 2016-11-30 ENCOUNTER — Ambulatory Visit (INDEPENDENT_AMBULATORY_CARE_PROVIDER_SITE_OTHER): Payer: BLUE CROSS/BLUE SHIELD | Admitting: Internal Medicine

## 2016-11-30 ENCOUNTER — Encounter: Payer: Self-pay | Admitting: Internal Medicine

## 2016-11-30 VITALS — BP 130/86 | HR 89 | Temp 97.9°F | Resp 14 | Ht 70.0 in | Wt >= 6400 oz

## 2016-11-30 DIAGNOSIS — E118 Type 2 diabetes mellitus with unspecified complications: Secondary | ICD-10-CM | POA: Diagnosis not present

## 2016-11-30 DIAGNOSIS — R200 Anesthesia of skin: Secondary | ICD-10-CM | POA: Diagnosis not present

## 2016-11-30 DIAGNOSIS — R202 Paresthesia of skin: Secondary | ICD-10-CM

## 2016-11-30 MED ORDER — ONETOUCH DELICA LANCETS 33G MISC: 11 refills | Status: AC

## 2016-11-30 MED ORDER — GLUCOSE BLOOD VI STRP: ORAL_STRIP | 12 refills | Status: DC

## 2016-11-30 MED ORDER — GABAPENTIN 300 MG PO CAPS: 600.0000 mg | ORAL_CAPSULE | Freq: Four times a day (QID) | ORAL | 5 refills | Status: DC

## 2016-11-30 NOTE — Progress Notes (Signed)
   Subjective:    Patient ID: Derrick [name], male    DOB: 02-27-1981, 36 y.o.   MRN: [id number]  HPI The patient is a 36 YO man coming in for some numbness in her fingertips. 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 When carrying equipment sometimes his grip strength seems less. He denies severe pain but takes gabapentin already for nerve pain in her feet. He denies swelling or injury. Started about 2-3 weeks ago and getting progressively worse.   Review of Systems  Constitutional: Negative for activity change, appetite change, fatigue, fever and unexpected weight change.  Respiratory: Negative.   Cardiovascular: Negative.   Gastrointestinal: Negative.   Musculoskeletal: Negative.   Skin: Negative.   Neurological: Positive for weakness and numbness. Negative for dizziness, tremors, syncope, facial asymmetry, speech difficulty and headaches.      Objective:   Physical Exam  Constitutional: He is oriented to person, place, and time. He appears well-developed and well-nourished.  HENT:  Head: Normocephalic and atraumatic.  Eyes: EOM are normal.  Cardiovascular: Normal rate and regular rhythm.   Pulmonary/Chest: Effort normal and breath sounds normal.  Abdominal: Soft.  Musculoskeletal:  Strong pulses in both hands, numbness in the tips of fingers. Good warmth and color.   Neurological: He is alert and oriented to person, place, and time.  Skin: Skin is warm and dry.   Vitals:   11/30/16 1322  BP: 130/86  Pulse: 89  Resp: 14  Temp: 97.9 F (36.6 C)  TempSrc: Oral  SpO2: 99%  Weight: (!) 415 lb (188.2 kg)  Height: 5\' 10"  (1.778 m)      Assessment & Plan:

## 2016-11-30 NOTE — Patient Instructions (Signed)
We have increased the gabapentin to 2 pills up to 4 times a day.   We will get you in with a hand specialist.

## 2016-11-30 NOTE — Assessment & Plan Note (Signed)
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 Increase gabapentin some to see if this helps. Talked about progressive neuropathy (diabetes is well controlled) versus nerve impingement from carpal tunnel or tendonitis.

## 2016-11-30 NOTE — Progress Notes (Signed)
Pre visit review using our clinic review tool, if applicable. No additional management support is needed unless otherwise documented below in the visit note. 

## 2016-12-08 ENCOUNTER — Ambulatory Visit (INDEPENDENT_AMBULATORY_CARE_PROVIDER_SITE_OTHER): Payer: Self-pay | Admitting: Orthopaedic Surgery

## 2016-12-12 ENCOUNTER — Ambulatory Visit (INDEPENDENT_AMBULATORY_CARE_PROVIDER_SITE_OTHER): Payer: BLUE CROSS/BLUE SHIELD | Admitting: Orthopaedic Surgery

## 2016-12-12 ENCOUNTER — Encounter (INDEPENDENT_AMBULATORY_CARE_PROVIDER_SITE_OTHER): Payer: Self-pay | Admitting: Orthopaedic Surgery

## 2016-12-12 ENCOUNTER — Ambulatory Visit (INDEPENDENT_AMBULATORY_CARE_PROVIDER_SITE_OTHER): Payer: Self-pay | Admitting: Orthopaedic Surgery

## 2016-12-12 DIAGNOSIS — R200 Anesthesia of skin: Secondary | ICD-10-CM

## 2016-12-12 NOTE — Progress Notes (Signed)
Office Visit Note   Patient: Derrick [name]           Date of Birth: 1981-03-17           MRN: [id number] Visit Date: 12/12/2016              Requested by: Myrlene Brokerrawford, Elizabeth A, MD [address] [place] [id number] PCP: Myrlene Brokerrawford, Elizabeth A, MD   Assessment & Plan: Visit Diagnoses:  1. Bilateral hand numbness     Plan: Overall impression is diabetic neuropathy. He has no focal findings on my exam. Follow-up with me as needed.  Follow-Up Instructions: Return if symptoms worsen or fail to improve.   Orders:  No orders of the defined types were placed in this encounter.  No orders of the defined types were placed in this encounter.     Procedures: No procedures performed   Clinical Data: No additional findings.   Subjective: Chief Complaint  Patient presents with  . Left Hand - Numbness  . Right Hand - Numbness    Patient is a [age] year old morbidly obese gentleman who comes in with bilateral hand numbness for about a month. He does play here. Denies any pain and burning or tingling. He feels like his hands: And they're equally numb. He is diabetic on insulin.    Review of Systems  Constitutional: Negative.   All other systems reviewed and are negative.    Objective: Vital Signs: There were no vitals taken for this visit.  Physical Exam  Constitutional: He is oriented to person, place, and time. He appears well-developed and well-nourished.  HENT:  Head: Normocephalic and atraumatic.  Eyes: Pupils are equal, round, and reactive to light.  Neck: Neck supple.  Pulmonary/Chest: Effort normal.  Abdominal: Soft.  Musculoskeletal: Normal range of motion.  Neurological: He is alert and oriented to person, place, and time.  Skin: Skin is warm.  Psychiatric: He has a normal mood and affect. His behavior is normal. Judgment and thought content normal.  Nursing note and vitals reviewed.   Ortho Exam  Bilateral hand exam shows no skin lesions  or rashes or masses. There is no swelling. Negative carpal tunnel compression signs. No muscular atrophy. Specialty Comments:  No specialty comments available.  Imaging: No results found.   PMFS History: Patient Active Problem List   Diagnosis Date Noted  . Numbness and tingling of hand 11/30/2016  . Tracheostomy dependence (HCC)   . Routine general medical examination at a health care facility 09/21/2016  . Facial lesion 09/21/2016  . ED (erectile dysfunction) 06/02/2016  . Tracheostomy status (HCC)   . Other allergic rhinitis   . Diabetes mellitus type 2 with complications (HCC) 08/11/2015  . Left ankle pain 08/11/2015  . Dysphagia   . Pulmonary hypertension (HCC)   . Morbid obesity (HCC) 06/13/2015  . Essential hypertension 06/13/2015  . OSA (obstructive sleep apnea) 06/13/2015  . Obesity hypoventilation syndrome (HCC) 06/13/2015  . Chronic diastolic heart failure (HCC) 06/13/2015  . CO2 retention 06/12/2015   Past Medical History:  Diagnosis Date  . Acute on chronic diastolic CHF (congestive heart failure), NYHA class 1 (HCC)   . Diabetes mellitus without complication (HCC)   . GERD (gastroesophageal reflux disease)   . Hypertension   . Reflux     Family History  Problem Relation Age of Onset  . Diabetes Maternal Grandmother   . Hypertension Maternal Grandmother     Past Surgical History:  Procedure Laterality Date  . TRACHEOSTOMY TUBE  PLACEMENT N/A 06/21/2015   Procedure: TRACHEOSTOMY;  Surgeon: Newman Pies, MD;  Location: [facility] OR;  Service: ENT;  Laterality: N/A;   Social History   Occupational History  . Not on file.   Social History Main Topics  . Smoking status: Never Smoker  . Smokeless tobacco: Never Used  . Alcohol use No  . Drug use: No  . Sexual activity: Not on file

## 2017-01-03 ENCOUNTER — Ambulatory Visit (HOSPITAL_COMMUNITY): Payer: BLUE CROSS/BLUE SHIELD

## 2017-01-04 ENCOUNTER — Ambulatory Visit: Payer: BLUE CROSS/BLUE SHIELD | Admitting: Internal Medicine

## 2017-01-04 ENCOUNTER — Ambulatory Visit (HOSPITAL_COMMUNITY): Payer: BLUE CROSS/BLUE SHIELD

## 2017-01-05 ENCOUNTER — Other Ambulatory Visit: Payer: Self-pay | Admitting: Internal Medicine

## 2017-01-11 ENCOUNTER — Inpatient Hospital Stay (HOSPITAL_COMMUNITY): Admission: RE | Admit: 2017-01-11 | Payer: BLUE CROSS/BLUE SHIELD | Source: Ambulatory Visit

## 2017-01-11 ENCOUNTER — Ambulatory Visit: Payer: BLUE CROSS/BLUE SHIELD | Admitting: Internal Medicine

## 2017-01-18 ENCOUNTER — Ambulatory Visit (HOSPITAL_COMMUNITY)
Admission: RE | Admit: 2017-01-18 | Discharge: 2017-01-18 | Disposition: A | Discharge status: Home / Self Care | Payer: BLUE CROSS/BLUE SHIELD | Source: Ambulatory Visit | Attending: Acute Care | Admitting: Acute Care

## 2017-01-18 DIAGNOSIS — Z930 Tracheostomy status: Secondary | ICD-10-CM

## 2017-01-18 DIAGNOSIS — Z430 Encounter for attention to tracheostomy: Secondary | ICD-10-CM | POA: Diagnosis present

## 2017-01-18 DIAGNOSIS — G4733 Obstructive sleep apnea (adult) (pediatric): Secondary | ICD-10-CM | POA: Diagnosis not present

## 2017-01-18 NOTE — Progress Notes (Signed)
Tracheostomy Procedure Note  Derrick HansenReginald A [name] [id number] March 26, 1981  Pre Procedure Tracheostomy Information  Trach Brand: Portex (Bivona) Size: 6.0 Style: Uncuffed Secured by: Velcro   Procedure: Trach cleaning and trach change Vitals:    BP123/84  RR 20   HR 92  Oxygen saturations  95% on RA   Post Procedure Tracheostomy Information  Trach Brand: Portex (Bivona) Size: 6.0 Style: Uncuffed Secured by: Velcro   Post Procedure Evaluation:  ETCO2 positive color change from yellow to purple : Yes.   Vital signs:blood pressure 142/79, pulse 90, respirations 22 and pulse oximetry 95 %    On RA Patients current condition: stable Complications: No apparent complications Trach site exam: clean and dry Wound care done: did Patient did tolerate procedure well.   Education:  none    Prescription needs: More trach cleaning and dressing supplies to clean twice daily    Additional needs: none

## 2017-01-19 NOTE — Progress Notes (Signed)
   Subjective:  Return visit   Patient ID: Derrick HansenReginald A [name], male    DOB: [birth year], 36 y.o.   MRN: [id number]  HPI  36 year old male w/ significant h/o MO and OSA. I have been following Derrick [name] for about a year now for his chronic trach. Derrick [name] is continuing to make progress with weight loss and working towards gastric by-pass surgery. We are keeping the trach until his weight is down to a point where he can have a sleep study and CPAP titration. He presents today for routine trach change.   Review of Systems  Constitutional: Negative.   HENT: Negative.   Eyes: Negative.   Respiratory: Negative for apnea, cough, choking, chest tightness and shortness of breath.        Does have some discomfort at the trach site.   Cardiovascular: Negative.   Gastrointestinal: Negative.   Endocrine: Negative.   Genitourinary: Negative.   Musculoskeletal: Negative.   Allergic/Immunologic: Negative.   Neurological: Negative.   Hematological: Negative.   Psychiatric/Behavioral: Negative.    Hr 92 rr 20 bp 123/84 sats 95%    Objective:   Physical Exam  Constitutional: He is oriented to person, place, and time. He appears well-developed and well-nourished. No distress.  HENT:  Head: Normocephalic and atraumatic.  Trach unremarkable   Eyes: Conjunctivae and EOM are normal. Pupils are equal, round, and reactive to light.  Neck: Normal range of motion. Neck supple.  Cardiovascular: Normal rate and regular rhythm.   Pulmonary/Chest: Effort normal and breath sounds normal. No respiratory distress.  Abdominal: Soft. Bowel sounds are normal.  Musculoskeletal: Normal range of motion. He exhibits no edema or deformity.  Neurological: He is alert and oriented to person, place, and time.  Skin: Skin is warm and dry. He is not diaphoretic. No erythema.  Psychiatric: He has a normal mood and affect. His behavior is normal.   # 6 bivona portex trach changed Site unremarkable      Assessment & Plan:   Trach dependent in setting of severe OSA MO  Discussion Doing well. Awaiting gastric by-pass surgery. Still not schedules. Sounds like the psych consult has been slow to sign off on surgery.   Plan Cont routine trach care  F/u 6-7 weeks   Simonne MartinetPeter E Tim Wilhide ACNP-BC [facility] Pulmonary/Critical Care Pager # [phone] OR # [phone] if no answer

## 2017-02-05 ENCOUNTER — Ambulatory Visit: Payer: BLUE CROSS/BLUE SHIELD | Admitting: Family Medicine

## 2017-02-07 ENCOUNTER — Other Ambulatory Visit: Payer: Self-pay | Admitting: Internal Medicine

## 2017-02-12 ENCOUNTER — Telehealth: Payer: Self-pay | Admitting: Acute Care

## 2017-02-12 MED ORDER — DOXYCYCLINE HYCLATE 100 MG PO TABS: 100.0000 mg | ORAL_TABLET | Freq: Two times a day (BID) | ORAL | 0 refills | Status: AC

## 2017-02-13 ENCOUNTER — Ambulatory Visit (HOSPITAL_COMMUNITY)
Admission: RE | Admit: 2017-02-13 | Discharge: 2017-02-13 | Disposition: A | Discharge status: Home / Self Care | Payer: BLUE CROSS/BLUE SHIELD | Source: Ambulatory Visit | Attending: Acute Care | Admitting: Acute Care

## 2017-02-13 DIAGNOSIS — L0291 Cutaneous abscess, unspecified: Secondary | ICD-10-CM | POA: Insufficient documentation

## 2017-02-14 DIAGNOSIS — L0291 Cutaneous abscess, unspecified: Secondary | ICD-10-CM | POA: Insufficient documentation

## 2017-02-14 NOTE — Progress Notes (Signed)
   Subjective:    Patient ID: Derrick [name], male    DOB: [birth year], 36 y.o.   MRN: [id number]  HPI Called by Derrick [name] on 7/16 w/ concern that he had painful raised area above his trach. He told me that he squeezed it and expressed a "large amount of yellow, blood tinged fluid", since that time he felt it was a little better but intermittently had some discomfort from it. I placed him on a prescription for Doxycycline and asked him to come see me so that I could look at the area and culture if needed.    Review of Systems  Constitutional: Negative for activity change, appetite change, chills, diaphoresis, fatigue and fever.  HENT: Negative.   Eyes: Negative.   Respiratory: Negative.   Cardiovascular: Negative.   Gastrointestinal: Negative.   Endocrine: Negative.   Genitourinary: Negative.   Musculoskeletal: Negative.   Allergic/Immunologic: Negative.   Neurological: Negative.   Hematological: Negative.   Psychiatric/Behavioral: Negative.        Objective:   Physical Exam  Constitutional: He is oriented to person, place, and time. He appears well-developed and well-nourished. He is active.  Non-toxic appearance. He does not have a sickly appearance.  HENT:  Head:    Eyes: Conjunctivae and EOM are normal. No scleral icterus.  Cardiovascular: Normal rate and regular rhythm.   Pulmonary/Chest: Effort normal and breath sounds normal.  Abdominal: Soft. Normal appearance. There is no tenderness.  Neurological: He is alert and oriented to person, place, and time.  Skin: Skin is warm, dry and intact. He is not diaphoretic.  Psychiatric: He has a normal mood and affect. His speech is normal. His mood appears not anxious.   Procedure  I sent a culture swab of the drainage from the small abscess.      Assessment & Plan:   Small raised boil vs abscess. seemingly better  -sent culture Advised warm compress Plan Warm compress Doxy for 7d Will call him to check on progress.    If gets worse I may try to drain it further   Simonne MartinetPeter E Hadleigh Felber ACNP-BC [facility] Pulmonary/Critical Care Pager # [phone] OR # [phone] if no answer

## 2017-02-19 ENCOUNTER — Telehealth: Payer: Self-pay | Admitting: Acute Care

## 2017-02-19 LAB — BODY FLUID CULTURE

## 2017-02-20 ENCOUNTER — Ambulatory Visit (INDEPENDENT_AMBULATORY_CARE_PROVIDER_SITE_OTHER): Payer: BLUE CROSS/BLUE SHIELD | Admitting: Family

## 2017-02-20 ENCOUNTER — Ambulatory Visit (INDEPENDENT_AMBULATORY_CARE_PROVIDER_SITE_OTHER)
Admission: RE | Admit: 2017-02-20 | Discharge: 2017-02-20 | Disposition: A | Discharge status: Home / Self Care | Payer: BLUE CROSS/BLUE SHIELD | Source: Ambulatory Visit | Attending: Family | Admitting: Family

## 2017-02-20 ENCOUNTER — Encounter: Payer: Self-pay | Admitting: Family

## 2017-02-20 ENCOUNTER — Other Ambulatory Visit: Payer: Self-pay | Admitting: Internal Medicine

## 2017-02-20 VITALS — BP 152/92 | HR 112 | Temp 98.4°F | Resp 18 | Ht 70.0 in | Wt >= 6400 oz

## 2017-02-20 DIAGNOSIS — R062 Wheezing: Secondary | ICD-10-CM | POA: Diagnosis not present

## 2017-02-20 DIAGNOSIS — L720 Epidermal cyst: Secondary | ICD-10-CM | POA: Diagnosis not present

## 2017-02-20 MED ORDER — ALBUTEROL SULFATE HFA 108 (90 BASE) MCG/ACT IN AERS: 2.0000 | INHALATION_SPRAY | Freq: Four times a day (QID) | RESPIRATORY_TRACT | 0 refills | Status: DC | PRN

## 2017-02-20 MED ORDER — DOXYCYCLINE HYCLATE 100 MG PO TABS: 100.0000 mg | ORAL_TABLET | Freq: Two times a day (BID) | ORAL | 0 refills | Status: DC

## 2017-02-20 MED ORDER — FLUTICASONE PROPIONATE HFA 110 MCG/ACT IN AERO: 2.0000 | INHALATION_SPRAY | Freq: Two times a day (BID) | RESPIRATORY_TRACT | 2 refills | Status: DC

## 2017-02-20 NOTE — Assessment & Plan Note (Signed)
Symptoms and exam are consistent with epidermoid cyst that is possible improving with doxycyline. Extend doxycyline course and follow up if symptoms worsen or do not improve.

## 2017-02-20 NOTE — Progress Notes (Signed)
 Subjective:    Patient ID: Derrick [name], male    DOB: 10/20/1980, 36 y.o.   MRN: [id number]  Chief Complaint  Patient presents with  . Cough    congestion, wheezing, cough, SOB, symptoms started last night, place on back of head he would like looked at    HPI:  Derrick [name] is a 36 y.o. male who  has a past medical history of Acute on chronic diastolic CHF (congestive heart failure), NYHA class 1 (HCC); Diabetes mellitus without complication (HCC); GERD (gastroesophageal reflux disease); Hypertension; and Reflux. and presents today for an office visit.  1.) Congestion - This is a new problem. Associated symptoms of coughing, wheezing, and shortness of breath have been going on for about 1 day. No fevers. Modifying factors include Mucinex and cough which have not helped very much. Reports trying Flovent prior to the office visit.   2.) Spot on head - This is a new problem. Associated symptom of a lesion located on the back of his head has been going on for about 3 weeks. There is some discomfort from it. Believes it has gotten larger since he initially first noted it. Denies any modifying factors or attempted treatments to make it better.    Allergies  Allergen Reactions  . Shrimp [Shellfish Allergy]   . Vancomycin Other (See Comments)  . Zosyn [Piperacillin Sod-Tazobactam So] Other (See Comments)      Outpatient Medications Prior to Visit  Medication Sig Dispense Refill  . allopurinol (ZYLOPRIM) 300 MG tablet TAKE ONE TABLET BY MOUTH ONCE DAILY 30 tablet 3  . Blood Glucose Monitoring Suppl (BAYER CONTOUR NEXT MONITOR) w/Device KIT Use to check blood sugars daily Dx E11.9 1 kit 0  . cetirizine (ZYRTEC) 10 MG tablet Take 1 tablet (10 mg total) by mouth daily. 30 tablet 11  . Dulaglutide (TRULICITY) 1.5 MG/0.5ML SOPN Inject 1.5 mg into the skin once a week. 4 pen 6  . famotidine (PEPCID) 20 MG tablet Take 1 tablet (20 mg total) by mouth daily. 30 tablet 3  .  famotidine (PEPCID) 20 MG tablet TAKE ONE TABLET BY MOUTH ONCE DAILY 30 tablet 3  . fluticasone (FLONASE) 50 MCG/ACT nasal spray Place 2 sprays into both nostrils daily. 16 g 6  . furosemide (LASIX) 80 MG tablet TAKE 1 TABLET BY MOUTH 4 TIMES DAILY 120 tablet 1  . gabapentin (NEURONTIN) 300 MG capsule Take 2 capsules (600 mg total) by mouth 4 (four) times daily. 240 capsule 5  . glipiZIDE (GLUCOTROL XL) 10 MG 24 hr tablet Take 1 tablet (10 mg total) by mouth daily with breakfast. 30 tablet 3  . glucose blood test strip Use as instructed 100 each 12  . guaiFENesin (MUCINEX) 600 MG 12 hr tablet Take 1 tablet (600 mg total) by mouth 2 (two) times daily. 60 tablet 11  . HYDROcodone-homatropine (HYCODAN) 5-1.5 MG/5ML syrup Take 5 mLs by mouth every 8 (eight) hours as needed for cough. 120 mL 0  . liraglutide (VICTOZA) 18 MG/3ML SOPN Inject 0.3 mLs (1.8 mg total) into the skin daily. 3 pen 6  . metFORMIN (GLUCOPHAGE) 500 MG tablet Take 1 tablet (500 mg total) by mouth daily with breakfast. 90 tablet 3  . metoprolol (LOPRESSOR) 50 MG tablet Take 1 tablet (50 mg total) by mouth 2 (two) times daily. 60 tablet 3  . montelukast (SINGULAIR) 10 MG tablet Take 1 tablet (10 mg total) by mouth at bedtime. 30 tablet 3  . nitroGLYCERIN (NITROSTAT) 0.4   MG SL tablet Place 0.4 mg under the tongue every 5 (five) minutes as needed for chest pain.    Glory Rosebush DELICA LANCETS 28U MISC Use as needed daily 100 each 11  . potassium chloride SA (K-DUR,KLOR-CON) 20 MEQ tablet Take 1 tablet (20 mEq total) by mouth 3 (three) times daily. 270 tablet 3  . ranitidine (ZANTAC) 150 MG capsule Take 1 capsule (150 mg total) by mouth 2 (two) times daily. 60 capsule 11  . Tracheostomy Care KIT 30 kits by Does not apply route daily. 30 each 6  . triamterene-hydrochlorothiazide (MAXZIDE-25) 37.5-25 MG tablet Take 1 tablet by mouth daily. 90 tablet 3  . albuterol (PROVENTIL HFA;VENTOLIN HFA) 108 (90 Base) MCG/ACT inhaler Inhale 2 puffs into  the lungs every 6 (six) hours as needed for wheezing or shortness of breath. 1 Inhaler 0  . fluticasone (FLOVENT HFA) 110 MCG/ACT inhaler Inhale 2 puffs into the lungs 2 (two) times daily. 1 Inhaler 12   No facility-administered medications prior to visit.       Past Surgical History:  Procedure Laterality Date  . TRACHEOSTOMY TUBE PLACEMENT N/A 06/21/2015   Procedure: TRACHEOSTOMY;  Surgeon: Leta Baptist, MD;  Location: [facility] OR;  Service: ENT;  Laterality: N/A;      Past Medical History:  Diagnosis Date  . Acute on chronic diastolic CHF (congestive heart failure), NYHA class 1 ([place])   . Diabetes mellitus without complication ([place])   . GERD (gastroesophageal reflux disease)   . Hypertension   . Reflux       Review of Systems  Constitutional: Negative for chills and fever.  HENT: Positive for congestion. Negative for ear discharge, ear pain, rhinorrhea, sinus pain, sinus pressure, sneezing and sore throat.   Respiratory: Positive for cough, shortness of breath and wheezing.       Objective:    BP (!) 152/92 (BP Location: Left Arm, Patient Position: Sitting, Cuff Size: Large)   Pulse (!) 112   Temp 98.4 F (36.9 C) (Oral)   Resp 18   Ht 5' 10" (1.778 m)   Wt (!) 419 lb (190.1 kg)   SpO2 94%   BMI 60.12 kg/m  Nursing note and vital signs reviewed.  Physical Exam  Constitutional: He is oriented to person, place, and time. He appears well-developed and well-nourished. No distress.  HENT:  Right Ear: Hearing, tympanic membrane, external ear and ear canal normal.  Left Ear: Hearing, tympanic membrane, external ear and ear canal normal.  Nose: Nose normal.  Mouth/Throat: Uvula is midline, oropharynx is clear and moist and mucous membranes are normal.  Small dime sized annular lesion/cyst that is soft and mobile located on the posterior aspect of his head.   Cardiovascular: Normal rate, regular rhythm, normal heart sounds and intact distal pulses.   Pulmonary/Chest: Effort  normal. No respiratory distress. He has wheezes. He has no rales. He exhibits no tenderness.  Neurological: He is alert and oriented to person, place, and time.  Skin: Skin is warm and dry.  Psychiatric: He has a normal mood and affect. His behavior is normal. Judgment and thought content normal.       Assessment & Plan:   Problem List Items Addressed This Visit      Other   Wheezing - Primary    New onset wheezing, cough and congestion while completing a course of doxycyline. There is concern for bronchitis or pneumonia given patient's previous history and multiple co-morbidities. Obtain chest x-ray. Refill albuterol and Flovent. Follow up  and additional treatment pending x-ray results.       Relevant Orders   DG Chest 2 View   Epidermoid cyst    Symptoms and exam are consistent with epidermoid cyst that is possible improving with doxycyline. Extend doxycyline course and follow up if symptoms worsen or do not improve.           I am having Mr. Granade start on doxycycline. I am also having him maintain his nitroGLYCERIN, fluticasone, Tracheostomy Care, famotidine, metFORMIN, potassium chloride SA, triamterene-hydrochlorothiazide, cetirizine, HYDROcodone-homatropine, famotidine, Dulaglutide, guaiFENesin, liraglutide, BAYER CONTOUR NEXT MONITOR, allopurinol, glipiZIDE, ranitidine, metoprolol tartrate, montelukast, glucose blood, gabapentin, ONETOUCH DELICA LANCETS 01X, furosemide, albuterol, and fluticasone.   Meds ordered this encounter  Medications  . albuterol (PROVENTIL HFA;VENTOLIN HFA) 108 (90 Base) MCG/ACT inhaler    Sig: Inhale 2 puffs into the lungs every 6 (six) hours as needed for wheezing or shortness of breath.    Dispense:  1 Inhaler    Refill:  0    Order Specific Question:   Supervising Provider    Answer:   Pricilla Holm A [[id number]]  . doxycycline (VIBRA-TABS) 100 MG tablet    Sig: Take 1 tablet (100 mg total) by mouth 2 (two) times daily.    Dispense:  10  tablet    Refill:  0    Order Specific Question:   Supervising Provider    Answer:   Pricilla Holm A [[id number]]  . fluticasone (FLOVENT HFA) 110 MCG/ACT inhaler    Sig: Inhale 2 puffs into the lungs 2 (two) times daily.    Dispense:  1 Inhaler    Refill:  2    Order Specific Question:   Supervising Provider    Answer:   Pricilla Holm A [[id number]]     Follow-up: Return if symptoms worsen or fail to improve.  Mauricio Po, FNP

## 2017-02-20 NOTE — Assessment & Plan Note (Signed)
New onset wheezing, cough and congestion while completing a course of doxycyline. There is concern for bronchitis or pneumonia given patient's previous history and multiple co-morbidities. Obtain chest x-ray. Refill albuterol and Flovent. Follow up and additional treatment pending x-ray results.

## 2017-02-20 NOTE — Patient Instructions (Addendum)
Thank you for choosing [facility].  SUMMARY AND INSTRUCTIONS:   Please continue to take your medications as prescribed.  Start the Flovent twice daily and the albuterol as needed for wheezing.  We will check your chest x-ray today.  Recommend checking your blood sugars at least 1-2 times daily.  Follow up if your symptoms worsen.   Medication:  Your prescription(s) have been submitted to your pharmacy or been printed and provided for you. Please take as directed and contact our office if you believe you are having problem(s) with the medication(s) or have any questions.  Follow up:  If your symptoms worsen or fail to improve, please contact our office for further instruction, or in case of emergency go directly to the emergency room at the closest medical facility.

## 2017-02-21 NOTE — Telephone Encounter (Signed)
I don't see med on med list

## 2017-03-07 ENCOUNTER — Inpatient Hospital Stay (HOSPITAL_BASED_OUTPATIENT_CLINIC_OR_DEPARTMENT_OTHER)
Admission: RE | Admit: 2017-03-07 | Discharge: 2017-03-07 | Disposition: A | Discharge status: Home / Self Care | Payer: BLUE CROSS/BLUE SHIELD | Source: Ambulatory Visit

## 2017-03-07 ENCOUNTER — Ambulatory Visit (HOSPITAL_COMMUNITY): Payer: BLUE CROSS/BLUE SHIELD

## 2017-03-07 DIAGNOSIS — M542 Cervicalgia: Secondary | ICD-10-CM

## 2017-03-07 DIAGNOSIS — R590 Localized enlarged lymph nodes: Secondary | ICD-10-CM | POA: Diagnosis not present

## 2017-03-07 DIAGNOSIS — G4733 Obstructive sleep apnea (adult) (pediatric): Secondary | ICD-10-CM | POA: Diagnosis not present

## 2017-03-07 DIAGNOSIS — Z930 Tracheostomy status: Secondary | ICD-10-CM | POA: Diagnosis not present

## 2017-03-07 NOTE — Progress Notes (Signed)
Subjective:  Bloody trach secretions Sick work-in  Patient ID: Derrick [name], male    DOB: April 26, 1981, 36 y.o.   MRN: [id number]  HPI 36 year old male w/ sig h/o tracheostomy dependence r/t severe sleep apnea & secondary PAH. I have followed him for over a year now for his trach management. Last time I saw reggie was for a sick work-in on 7/18. At that time he had called me w/ concern for small raised boil located about 12 o'clock above his trach stoma. He was able to express purulent matter from it so we cultured it and treated him w/ doxy. Plus instructed him to use warm compresses. The cultures came back from the fluid as staph aureus. This was actually resistant to tetracycline but because when I called him he noted the swelling and drainage had resolved I decided to not treat w/ a second round of abx.  On the evening of 8/7 he called me reporting some pain at his trach site and increased blood tinged tracheal secretions. It has been about 5-6 weeks since his last trach change (which is typically when this trach starts to become more irritable for him), so I asked him to come see me. I worked him in on the 8th BUT he called and canceled due to a sick child. We were able to work him in again on the 10th (today) for the above issues.   Review of Systems  Constitutional: Negative.  Negative for fatigue and fever.  HENT: Positive for postnasal drip and rhinorrhea. Negative for nosebleeds, sinus pain, sinus pressure, sneezing, sore throat, trouble swallowing and voice change.        Has a swollen right posterior cervical LN, He states this is getting smaller and less discomfort.    Eyes: Negative.   Respiratory: Negative for cough, chest tightness and shortness of breath.   Cardiovascular: Negative.   Gastrointestinal: Negative.   Endocrine: Negative.   Genitourinary: Negative.   Musculoskeletal: Negative.   Allergic/Immunologic: Negative.   Neurological: Negative.   Hematological:  Negative.   Psychiatric/Behavioral: Negative.        Objective:   Physical Exam  Constitutional: He is oriented to person, place, and time. Vital signs are normal. He appears well-nourished. He is active.  Non-toxic appearance. He does not have a sickly appearance. He does not appear ill. No distress.  HENT:  Head: Normocephalic and atraumatic.  Neck:    Cardiovascular: Normal rate and regular rhythm.  Exam reveals distant heart sounds.   Pulmonary/Chest: Breath sounds normal. No accessory muscle usage. No respiratory distress.  His phonation is strong w/ PMV.  No sig drainage from trach. No odor from trach   Abdominal: Soft. Normal appearance and bowel sounds are normal. There is no tenderness.  Musculoskeletal: Normal range of motion.  Lymphadenopathy:    He has cervical adenopathy.       Right cervical: Superficial cervical adenopathy present.  Neurological: He is alert and oriented to person, place, and time. He has normal strength. No cranial nerve deficit or sensory deficit.  Skin: Skin is warm and dry.  Psychiatric: Cognition and memory are normal.   Trach change: Size 6. Uncuffed biovona  Changed w/out difficulty      Assessment & Plan:  Tracheostomy dependence  Severe OSA/OHS w/ secondary PAH Diastolic heart dysfxn Recent small raised boil above trach stoma (resolved) Posterior Cervical adenopathy (improved)  Discussion I did not find anything acutely wrong w/ his trach. He had no evidence of  acute infection at the trach site, no evidence of tracheobronchitis and really minimal tracheal secretions. I do think that the Bivona trachs tend to have a much shorter service life in comparison to the Shiley trachs. I think this is the trade-off for the comfort of them. It is quit typical for patients to start to complain of irritated trach site around the 4-6 week period depending on the individual (in my experience). He does have some mild right anterior cervical adenopathy.  It is getting better so I suspect it will go away w/out intervention. I did advise him to let his PCP know if it persists.  Plan Continue routine trach care ROV 4-6 weeks for trach change.  He has my contact # should he need me sooner.   My time 45 minutes.   Simonne MartinetPeter E Malori [name] ACNP-BC [facility] Pulmonary/Critical Care Pager # [phone] OR # [phone] if no answer

## 2017-03-09 ENCOUNTER — Ambulatory Visit (HOSPITAL_COMMUNITY)
Admission: RE | Admit: 2017-03-09 | Discharge: 2017-03-09 | Disposition: A | Discharge status: Home / Self Care | Payer: BLUE CROSS/BLUE SHIELD | Source: Ambulatory Visit | Attending: Acute Care | Admitting: Acute Care

## 2017-03-09 DIAGNOSIS — E662 Morbid (severe) obesity with alveolar hypoventilation: Secondary | ICD-10-CM | POA: Insufficient documentation

## 2017-03-09 DIAGNOSIS — Z430 Encounter for attention to tracheostomy: Secondary | ICD-10-CM | POA: Diagnosis present

## 2017-03-09 DIAGNOSIS — R590 Localized enlarged lymph nodes: Secondary | ICD-10-CM | POA: Insufficient documentation

## 2017-03-09 DIAGNOSIS — I2721 Secondary pulmonary arterial hypertension: Secondary | ICD-10-CM | POA: Insufficient documentation

## 2017-03-09 NOTE — Progress Notes (Signed)
Tracheostomy Procedure Note  Derrick HansenReginald A Grauberger [id number] [birth year]  Pre Procedure Tracheostomy Information  Trach Brand: Bivona Size: 6 Style: Uncuffed Secured by: Velcro   Procedure: trach change    Post Procedure Tracheostomy Information  Trach Brand: Bivona Size: 6 Style: Uncuffed Secured by: Velcro   Post Procedure Evaluation:  ETCO2 positive color change from yellow to purple : Yes.   Vital signs:blood pressure 145/83, pulse 88, respirations 18 and pulse oximetry 97% with PMV Patients current condition: stable Complications: No apparent complications Trach site exam: clean, dry, no drainage Wound care done: 4 x 4 gauze Patient did tolerate procedure well.   Education: None  New PMV given  See back in trach clinic in 4 weeks

## 2017-03-13 DIAGNOSIS — R590 Localized enlarged lymph nodes: Secondary | ICD-10-CM | POA: Insufficient documentation

## 2017-03-13 DIAGNOSIS — M542 Cervicalgia: Secondary | ICD-10-CM | POA: Insufficient documentation

## 2017-03-21 ENCOUNTER — Other Ambulatory Visit: Payer: Self-pay | Admitting: Internal Medicine

## 2017-03-21 NOTE — Telephone Encounter (Signed)
Pt called regarding this  Last saw greg 01/2017 for acute  Last saw Okey Dupre 10/2016 for acute Please advise

## 2017-03-22 NOTE — Telephone Encounter (Signed)
Pt informed of med sent in.

## 2017-03-27 ENCOUNTER — Other Ambulatory Visit: Payer: Self-pay | Admitting: Internal Medicine

## 2017-03-28 ENCOUNTER — Other Ambulatory Visit: Payer: Self-pay

## 2017-03-28 MED ORDER — FUROSEMIDE 80 MG PO TABS: ORAL_TABLET | ORAL | 1 refills | Status: DC

## 2017-04-04 ENCOUNTER — Ambulatory Visit (HOSPITAL_COMMUNITY): Payer: BLUE CROSS/BLUE SHIELD

## 2017-04-08 IMAGING — CR DG CHEST 2V
2 series · 2 of 2 positions shown · non-contrast
Comparison: 03/06/2016

CLINICAL DATA: Pt c/o chest pain x 2 days

EXAM:
CHEST  2 VIEW

[w chest pa *]
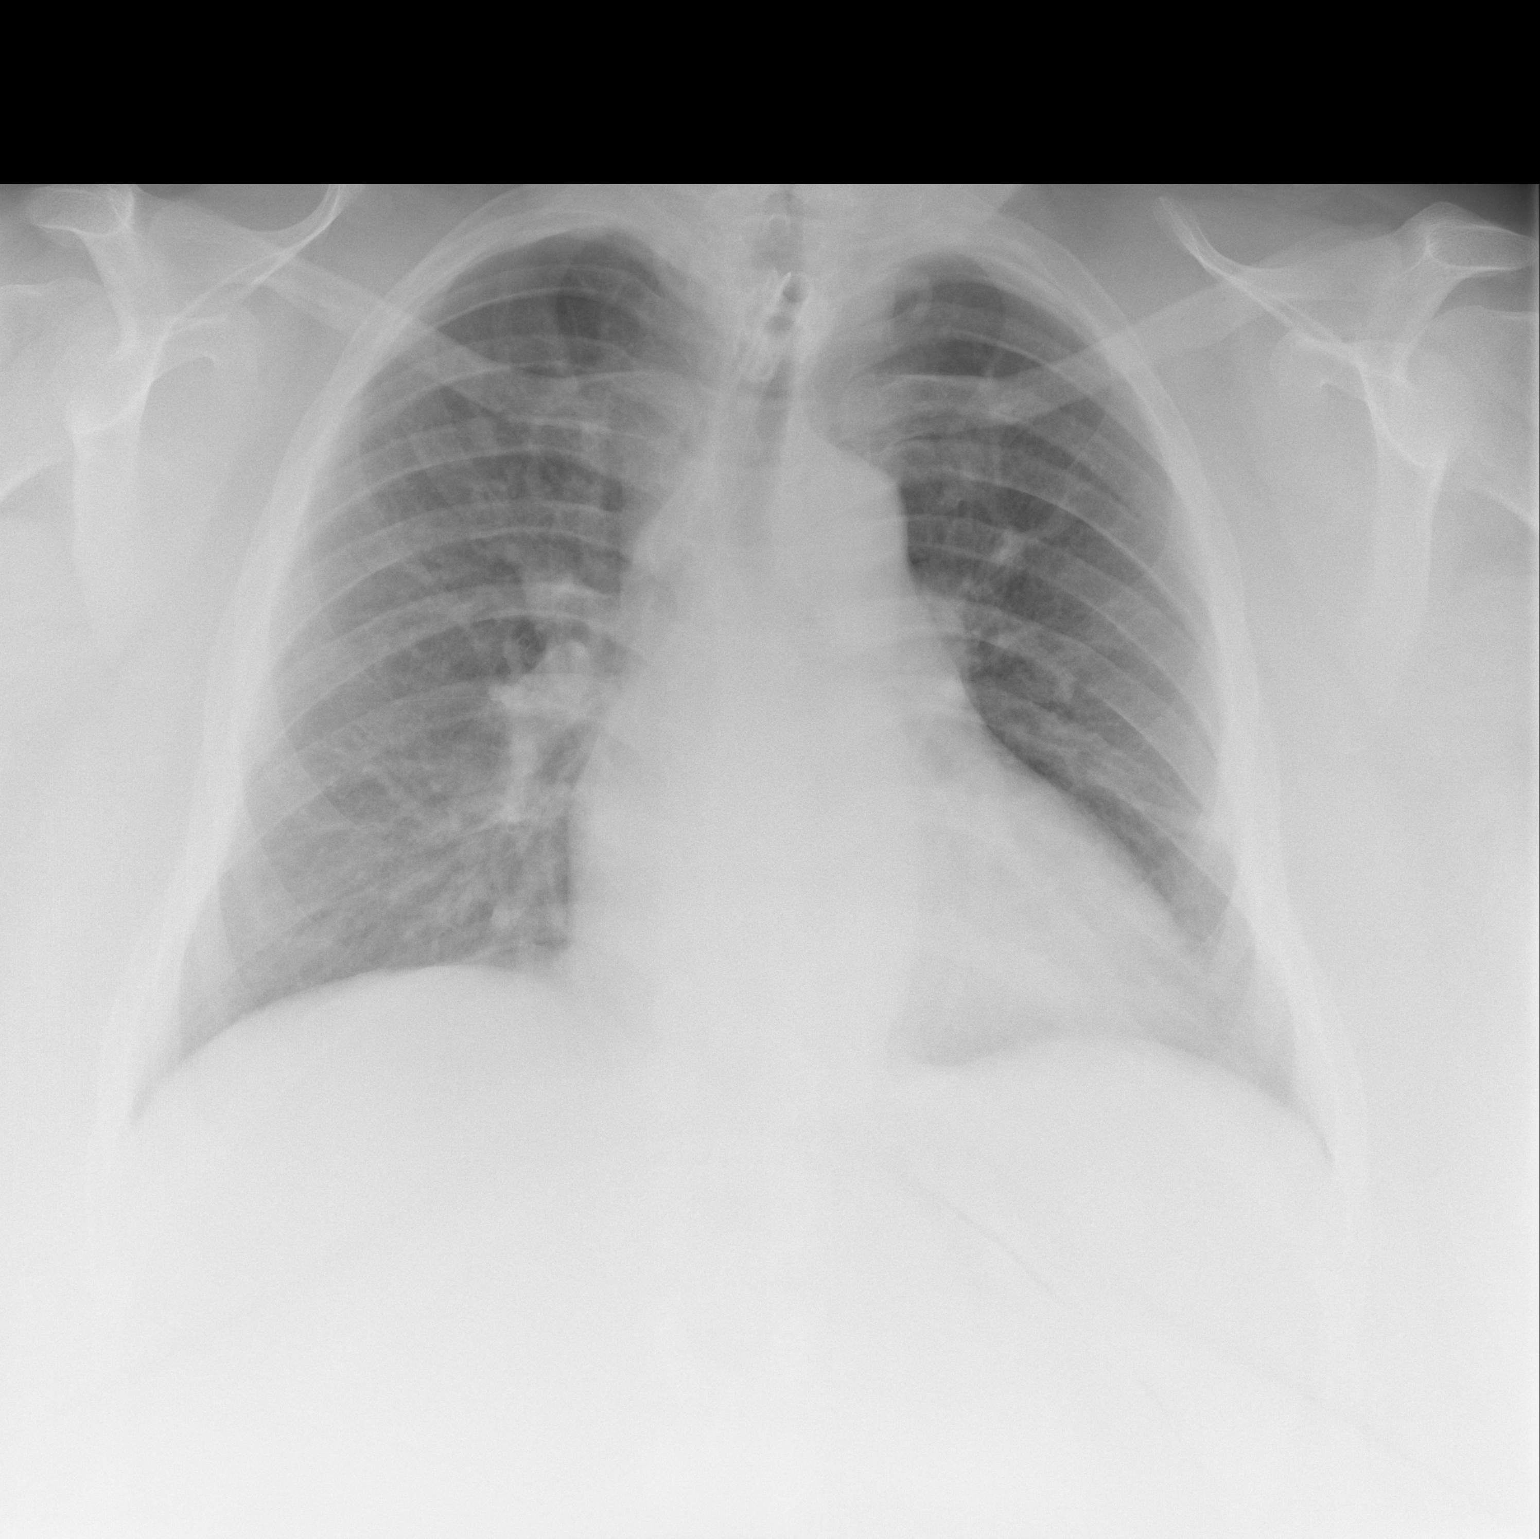

[w chest lat]
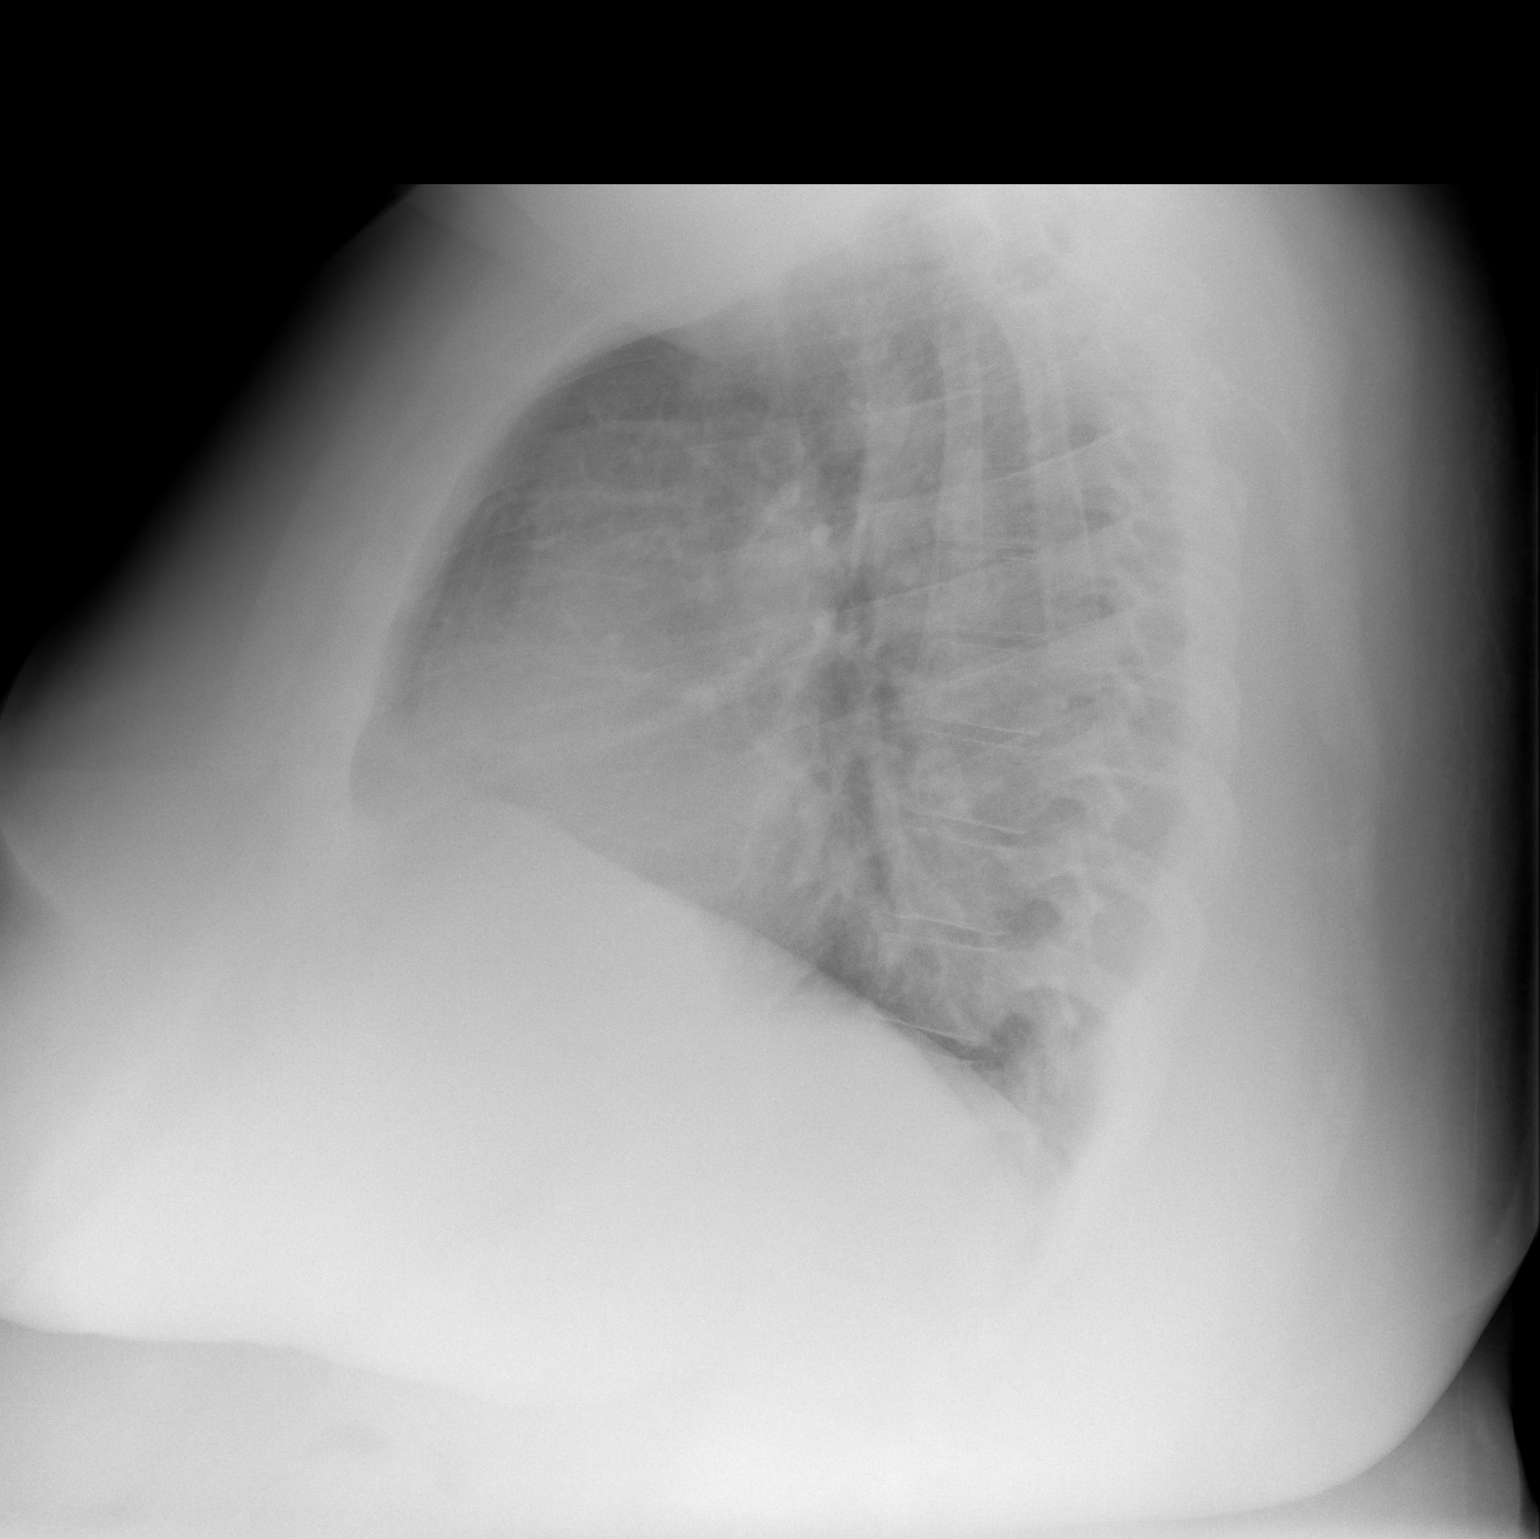

[2 of 2 positions shown; findings below may reference images not displayed]

FINDINGS: Heart is mildly enlarged. No focal consolidations or pleural
effusions. No pulmonary edema. Tracheostomy tube appears unchanged.
IMPRESSION: Mild cardiomegaly.  No evidence for acute pulmonary abnormality.

## 2017-04-10 ENCOUNTER — Ambulatory Visit: Payer: Self-pay | Admitting: Internal Medicine

## 2017-04-18 ENCOUNTER — Encounter (HOSPITAL_COMMUNITY)
Admission: RE | Admit: 2017-04-18 | Discharge: 2017-04-18 | Disposition: A | Discharge status: Home / Self Care | Payer: BLUE CROSS/BLUE SHIELD | Source: Ambulatory Visit | Attending: Acute Care | Admitting: Acute Care

## 2017-04-18 ENCOUNTER — Encounter: Payer: Self-pay | Admitting: Internal Medicine

## 2017-04-18 ENCOUNTER — Other Ambulatory Visit (INDEPENDENT_AMBULATORY_CARE_PROVIDER_SITE_OTHER): Payer: BLUE CROSS/BLUE SHIELD

## 2017-04-18 ENCOUNTER — Ambulatory Visit (INDEPENDENT_AMBULATORY_CARE_PROVIDER_SITE_OTHER): Payer: BLUE CROSS/BLUE SHIELD | Admitting: Internal Medicine

## 2017-04-18 VITALS — BP 138/84 | HR 75 | Temp 97.9°F | Ht 70.0 in | Wt >= 6400 oz

## 2017-04-18 DIAGNOSIS — Z930 Tracheostomy status: Secondary | ICD-10-CM

## 2017-04-18 DIAGNOSIS — N528 Other male erectile dysfunction: Secondary | ICD-10-CM | POA: Diagnosis not present

## 2017-04-18 DIAGNOSIS — R200 Anesthesia of skin: Secondary | ICD-10-CM

## 2017-04-18 DIAGNOSIS — R062 Wheezing: Secondary | ICD-10-CM

## 2017-04-18 DIAGNOSIS — R202 Paresthesia of skin: Secondary | ICD-10-CM | POA: Diagnosis not present

## 2017-04-18 DIAGNOSIS — E662 Morbid (severe) obesity with alveolar hypoventilation: Secondary | ICD-10-CM | POA: Diagnosis not present

## 2017-04-18 DIAGNOSIS — G4733 Obstructive sleep apnea (adult) (pediatric): Secondary | ICD-10-CM

## 2017-04-18 DIAGNOSIS — E118 Type 2 diabetes mellitus with unspecified complications: Secondary | ICD-10-CM

## 2017-04-18 DIAGNOSIS — Z430 Encounter for attention to tracheostomy: Secondary | ICD-10-CM | POA: Diagnosis present

## 2017-04-18 LAB — LIPID PANEL
CHOL/HDL RATIO: 3
Cholesterol: 129 mg/dL (ref 0–200)
HDL: 49.5 mg/dL (ref >39.00)
LDL Cholesterol: 43 mg/dL (ref 0–99)
NONHDL: 79.04
Triglycerides: 180 mg/dL — ABNORMAL HIGH (ref 0.0–149.0)
VLDL: 36 mg/dL (ref 0.0–40.0)

## 2017-04-18 LAB — COMPREHENSIVE METABOLIC PANEL
ALT: 35 U/L (ref 0–53)
AST: 30 U/L (ref 0–37)
Albumin: 3.9 g/dL (ref 3.5–5.2)
Alkaline Phosphatase: 150 U/L — ABNORMAL HIGH (ref 39–117)
BILIRUBIN TOTAL: 0.6 mg/dL (ref 0.2–1.2)
BUN: 14 mg/dL (ref 6–23)
CO2: 37 meq/L — AB (ref 19–32)
CREATININE: 0.94 mg/dL (ref 0.40–1.50)
Calcium: 9.5 mg/dL (ref 8.4–10.5)
Chloride: 90 mEq/L — ABNORMAL LOW (ref 96–112)
GFR: 117.53 mL/min (ref >60.00)
GLUCOSE: 367 mg/dL — AB (ref 70–99)
Potassium: 3.2 mEq/L — ABNORMAL LOW (ref 3.5–5.1)
SODIUM: 133 meq/L — AB (ref 135–145)
TOTAL PROTEIN: 8 g/dL (ref 6.0–8.3)

## 2017-04-18 LAB — CBC
HCT: 39.9 % (ref 39.0–52.0)
Hemoglobin: 12.9 g/dL — ABNORMAL LOW (ref 13.0–17.0)
MCHC: 32.2 g/dL (ref 30.0–36.0)
MCV: 91.3 fl (ref 78.0–100.0)
PLATELETS: 217 10*3/uL (ref 150.0–400.0)
RBC: 4.37 Mil/uL (ref 4.22–5.81)
RDW: 13.7 % (ref 11.5–15.5)
WBC: 9.4 10*3/uL (ref 4.0–10.5)

## 2017-04-18 LAB — HEMOGLOBIN A1C

## 2017-04-18 LAB — TSH: TSH: 1.21 u[IU]/mL (ref 0.35–4.50)

## 2017-04-18 LAB — VITAMIN B12: Vitamin B-12: 369 pg/mL (ref 211–911)

## 2017-04-18 LAB — VITAMIN D 25 HYDROXY (VIT D DEFICIENCY, FRACTURES): VITD: 8.06 ng/mL — AB (ref 30.00–100.00)

## 2017-04-18 MED ORDER — SILDENAFIL CITRATE 100 MG PO TABS: 50.0000 mg | ORAL_TABLET | Freq: Every day | ORAL | 11 refills | Status: DC | PRN

## 2017-04-18 MED ORDER — DOXYCYCLINE HYCLATE 100 MG PO TABS: 100.0000 mg | ORAL_TABLET | Freq: Two times a day (BID) | ORAL | 0 refills | Status: DC

## 2017-04-18 MED ORDER — CYANOCOBALAMIN 1000 MCG/ML IJ SOLN
1000.0000 ug | Freq: Once | INTRAMUSCULAR | Status: AC
  Administered 2017-04-18: 1000 ug via INTRAMUSCULAR

## 2017-04-18 NOTE — Assessment & Plan Note (Signed)
Rx for doxycycline for clearance and reminded to take his singulair and zyrtec and flonase as well as flovent and albuterol as needed. Call if no improvement.

## 2017-04-18 NOTE — Progress Notes (Signed)
   Subjective:    Patient ID: Derrick [name], male    DOB: 03-Mar-1981, 36 y.o.   MRN: [id number]  HPI The patient is a 36 YO man coming in for peripheral neuropathy in his hands. He is taking gabapentin which helps with the discomfort. 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 Went to hand specialist and they told him it was related to his diabetes and did not try anything. This resolved but he is having another episode of it and wonders if anything else could be causing it.  Next concern is ED (can get but not maintain erections and not able to have intercourse to his satisfaction, has not tried anything in the past). Denies injury or changes recently. No discharge or burning.  Next concern is coughing and sinus congestion. He does have cough and some fatigue. No chills or fevers. He is having more discharge from trach. No sick contact. Taking allergy medications without change and they are not helping now. Overall worsening over the last 1 week or so.    Review of Systems  Constitutional: Negative for activity change, appetite change, fatigue, fever and unexpected weight change.  HENT: Positive for congestion, postnasal drip, rhinorrhea and sinus pressure. Negative for ear discharge, ear pain, sinus pain, sore throat and trouble swallowing.   Eyes: Negative.   Respiratory: Positive for cough and wheezing. Negative for apnea, choking and shortness of breath.   Cardiovascular: Negative.   Gastrointestinal: Negative.   Genitourinary: Negative for discharge, genital sores and penile pain.       ED  Musculoskeletal: Negative.   Neurological: Positive for numbness. Negative for dizziness, tremors, facial asymmetry and weakness.  Psychiatric/Behavioral: Negative.       Objective:   Physical Exam  Constitutional: He is oriented to person, place, and time. He appears well-developed and well-nourished.  Morbidly obese  HENT:  Head:  Normocephalic and atraumatic.  Eyes: EOM are normal.  Neck: Normal range of motion.  Cardiovascular: Normal rate and regular rhythm.   Pulmonary/Chest: Effort normal. No respiratory distress. He has wheezes. He has no rales.  Some expiratory wheezing which partially clears with coughing  Abdominal: Soft. Bowel sounds are normal. He exhibits no distension. There is no tenderness. There is no rebound.  Musculoskeletal: He exhibits no edema.  Neurological: He is alert and oriented to person, place, and time. Coordination normal.  Skin: Skin is warm and dry.  Psychiatric: He has a normal mood and affect.   Vitals:   04/18/17 1125  BP: 138/84  Pulse: 75  Temp: 97.9 F (36.6 C)  TempSrc: Oral  SpO2: 98%  Weight: (!) 412 lb (186.9 kg)  Height:  (1.778 m)      Assessment & Plan:  B12 shot given at visit

## 2017-04-18 NOTE — Assessment & Plan Note (Signed)
Checking HgA1c as no follow up for this in some time due to many visits acute for congestion. If worsening could be causing more neuropathy problems. Taking metformin, victoza, glipizide and previously not controlled and complicated by neuropathy which is worsening.

## 2017-04-18 NOTE — Progress Notes (Signed)
Tracheostomy Procedure Note  Derrick [name] [id number] Nov 07, 1980  Pre Procedure Tracheostomy Information  Trach Brand: bivona Size: 6.0 Style: Uncuffed Secured by: Velcro   Procedure: trach change with lidocaine neb    Post Procedure Tracheostomy Information  Trach Brand: bivona Size: 6.0 Style: Uncuffed Secured by: Velcro   Post Procedure Evaluation:  ETCO2 positive color change from yellow to purple : Yes.   Vital signs:blood pressure 151/81, pulse 100, respirations 17 and pulse oximetry 95 % Patients current condition: stable Complications: No apparent complications Trach site exam: clean, dry Wound care done: dry and 4 x 4 gauze Patient did tolerate procedure well.   Education: none  Prescription needs: none    Additional needs: none

## 2017-04-18 NOTE — Assessment & Plan Note (Signed)
Rx for viagra and reminded about parameters to seek care with usage. Could be related to his poorly controlled diabetes and we talked about controlling his sugars to help with this problem.

## 2017-04-18 NOTE — Progress Notes (Signed)
CC: routine follow-up  Subjective:    Patient ID: Derrick [name], male    DOB: 16-Aug-1980, 36 y.o.   MRN: [id number]  HPI This 36 year old obese male with past medical history of obstructive sleep apnea, obesity hypoventilation syndrome, and tracheostomy dependence. He presents today for a routine tracheostomy change.   Review of Systems  Constitutional: Negative.   HENT: Negative.   Eyes: Negative.   Respiratory: Negative.   Cardiovascular: Negative.   Gastrointestinal: Negative.   Endocrine: Negative.   Genitourinary: Negative.   Musculoskeletal: Negative.   Allergic/Immunologic: Negative.   Neurological: Negative.   Hematological: Negative.   Psychiatric/Behavioral: Negative.    Heart rate 100 respirations 17. Blood pressure 151/81 saturations 95%    Objective:   Physical Exam  Constitutional: He is oriented to person, place, and time. He appears well-developed and well-nourished.  Obese no acute distress  HENT:  Head: Normocephalic.  Mouth/Throat: Oropharynx is clear and moist.  #6 trach [place], unremarkable.  Eyes: Pupils are equal, round, and reactive to light. EOM are normal.  Cardiovascular: Normal rate and regular rhythm.   No murmur heard. Pulmonary/Chest: Effort normal and breath sounds normal. No respiratory distress.  Abdominal: Soft. Bowel sounds are normal.  Musculoskeletal: Normal range of motion.  Neurological: He is alert and oriented to person, place, and time.  Skin: Skin is warm and dry.  Psychiatric: He has a normal mood and affect.   Cheryll Cockayne: NWGNFA Size: 6.0 Style: Uncuffed Secured by: Velcro     Assessment & Plan:   Tracheostomy dependent in the setting severe obesity hypoventilation syndrome as well as obstructive sleep apnea.  Doing well with weight loss. No new changes. Working out daily.  Plan: Continue routine trach care Follow-up about 6 weeks Encourage weight loss and mobility  [name] ACNP-BC [facility]  Pulmonary/Critical Care Pager # [phone] OR # [phone] if no answer

## 2017-04-18 NOTE — Assessment & Plan Note (Addendum)
Checking vitamin d,b12, thyroid, CMP, HgA1c for cause. Having worsening problems and already taking gabapentin. 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 B12 shot given after labs in case low he can start treatment. If not low will not get further B12 shots.

## 2017-04-18 NOTE — Patient Instructions (Signed)
We have sent in the viagra to try for erections. If you get an erection lasting more than 3 hours seek medical care.   We have sent in a refill of the doxycycline antibiotic for the congestion.   We are checking the labs today and have given you a vitamin B12 shot.

## 2017-04-20 ENCOUNTER — Other Ambulatory Visit: Payer: Self-pay | Admitting: Internal Medicine

## 2017-04-20 DIAGNOSIS — E118 Type 2 diabetes mellitus with unspecified complications: Principal | ICD-10-CM

## 2017-04-20 DIAGNOSIS — E1165 Type 2 diabetes mellitus with hyperglycemia: Secondary | ICD-10-CM

## 2017-04-20 DIAGNOSIS — IMO0002 Reserved for concepts with insufficient information to code with codable children: Secondary | ICD-10-CM

## 2017-04-20 MED ORDER — VITAMIN D (ERGOCALCIFEROL) 1.25 MG (50000 UNIT) PO CAPS: 50000.0000 [IU] | ORAL_CAPSULE | ORAL | 0 refills | Status: DC

## 2017-05-24 ENCOUNTER — Telehealth: Payer: Self-pay | Admitting: Acute Care

## 2017-05-24 MED ORDER — DOXYCYCLINE HYCLATE 100 MG PO TABS: 100.0000 mg | ORAL_TABLET | Freq: Two times a day (BID) | ORAL | 0 refills | Status: AC

## 2017-05-30 ENCOUNTER — Inpatient Hospital Stay (HOSPITAL_COMMUNITY): Admission: RE | Admit: 2017-05-30 | Payer: BLUE CROSS/BLUE SHIELD | Source: Ambulatory Visit

## 2017-05-30 ENCOUNTER — Ambulatory Visit (HOSPITAL_COMMUNITY)
Admission: RE | Admit: 2017-05-30 | Discharge: 2017-05-30 | Disposition: A | Discharge status: Home / Self Care | Payer: BLUE CROSS/BLUE SHIELD | Source: Ambulatory Visit | Attending: Acute Care | Admitting: Acute Care

## 2017-05-30 ENCOUNTER — Ambulatory Visit (HOSPITAL_COMMUNITY): Payer: BLUE CROSS/BLUE SHIELD

## 2017-05-30 DIAGNOSIS — G4733 Obstructive sleep apnea (adult) (pediatric): Secondary | ICD-10-CM | POA: Diagnosis present

## 2017-05-30 DIAGNOSIS — Z930 Tracheostomy status: Secondary | ICD-10-CM | POA: Diagnosis not present

## 2017-05-30 DIAGNOSIS — Z4682 Encounter for fitting and adjustment of non-vascular catheter: Secondary | ICD-10-CM | POA: Diagnosis present

## 2017-05-30 DIAGNOSIS — L03221 Cellulitis of neck: Secondary | ICD-10-CM | POA: Diagnosis not present

## 2017-05-30 NOTE — Progress Notes (Signed)
Tracheostomy Procedure Note  Derrick HansenReginald A Staley [id number] 02/22/1981  Pre Procedure Tracheostomy Information  Trach Brand: Bivona Size: 6 Style: Uncuffed Secured by: Velcro   Procedure: trach change    Post Procedure Tracheostomy Information  Trach Brand: Bivona Size: 6 Style: Uncuffed Secured by: Velcro   Post Procedure Evaluation:  ETCO2 positive color change from yellow to purple : Yes.   Vital signs:, pulse 94, respirations 18 and pulse oximetry 96% on RA with PMV Patients current condition: stable Complications: No apparent complications Trach site exam: clean, dry Wound care done: 4 x 4 gauze Patient did tolerate procedure well.   Education: None  See back in trach clinic in 6 weeks

## 2017-05-31 DIAGNOSIS — L03221 Cellulitis of neck: Secondary | ICD-10-CM | POA: Insufficient documentation

## 2017-05-31 NOTE — Progress Notes (Signed)
    Reason for visit: Follow-up for mild cellulitis trach site Routine tracheostomy change  History of present illness Patient is well-known to me I follow him for his tracheostomy which he has for obstructive sleep apnea.  He contacted me on 10/25 with chief complaint of red raised area located around 2:00 at his trach stoma.  Please send a picture of this, and we empirically treated him for cellulitis with doxycycline, I asked him to come and see me this week to ensure that the area of concern was improving.  Presents today in his usual state of health.  The area of concern has resolved, he has no new complaints.  Review of systems Constitutional: No fever chills sick exposures feeling well. HEENT: No headache, nasal congestion, or sore throat. Pulmonary: No wheezing, shortness of breath, no tracheal discharge Cardiac: No chest discomfort, pain, please call me back or lower extremity edema Abdomen no pain, nausea vomiting diarrhea, no change in appetite, still working on diet and losing weight Musculoskeletal: No new findings GU: No new findings Neuro: No new findings Endocrine: Negative.  Physical exam: Heart rate 94 respirations 18 saturations 96% on room air.  He has a Passy-Muir valve in place  General: This is a 36 year old obese male currently in no acute distress he walked into the clinic, without exertional dyspnea. HEENT: Normocephalic atraumatic he has a #6 uncuffed Bivona tracheostomy in place he has no discharge from the trach stoma no irritation, erythema, or raised area of concern in comparison to the 25th Pulmonary: Clear to auscultation no accessory muscle use Cardiac: Regular rhythm without murmur rub or gallop Extremity: No edema warm to palpation Neuro no focal deficits  Procedure: Routine tracheostomy change.  The patient was premedicated with topical lidocaine following that the tracheostomy was removed and a new #6 Bivona tracheostomy was placed without  difficulty end-tidal CO2 was checked positive patient tolerated procedure well  Impression plan Tracheostomy dependence in setting of severe obstructive sleep apnea Resolved tracheostomy site cellulitis  Plan Return to clinic 6 weeks for routine tracheostomy change  My time 34 minutes  Simonne MartinetPeter E Zyiere Rosemond ACNP-BC [facility] Pager # [phone] OR # [phone] if no answer

## 2017-06-01 ENCOUNTER — Other Ambulatory Visit: Payer: Self-pay | Admitting: Internal Medicine

## 2017-06-05 ENCOUNTER — Other Ambulatory Visit: Payer: Self-pay

## 2017-06-05 MED ORDER — FUROSEMIDE 80 MG PO TABS: ORAL_TABLET | ORAL | 3 refills | Status: DC

## 2017-06-06 ENCOUNTER — Ambulatory Visit (HOSPITAL_COMMUNITY): Payer: BLUE CROSS/BLUE SHIELD

## 2017-06-20 ENCOUNTER — Other Ambulatory Visit: Payer: Self-pay | Admitting: Internal Medicine

## 2017-06-20 ENCOUNTER — Telehealth: Payer: Self-pay | Admitting: Internal Medicine

## 2017-06-20 DIAGNOSIS — E118 Type 2 diabetes mellitus with unspecified complications: Secondary | ICD-10-CM

## 2017-06-20 NOTE — Telephone Encounter (Signed)
Pt called for a refill of his gabapentin  Please advise would like this sent for before the holiday

## 2017-06-25 NOTE — Telephone Encounter (Signed)
Sent in on 06/20/17

## 2017-07-04 ENCOUNTER — Ambulatory Visit (HOSPITAL_COMMUNITY): Payer: BLUE CROSS/BLUE SHIELD

## 2017-07-06 ENCOUNTER — Ambulatory Visit (HOSPITAL_COMMUNITY)
Admission: RE | Admit: 2017-07-06 | Discharge: 2017-07-06 | Disposition: A | Discharge status: Home / Self Care | Payer: BLUE CROSS/BLUE SHIELD | Source: Ambulatory Visit | Attending: Acute Care | Admitting: Acute Care

## 2017-07-06 DIAGNOSIS — Z430 Encounter for attention to tracheostomy: Secondary | ICD-10-CM | POA: Insufficient documentation

## 2017-07-06 DIAGNOSIS — Z930 Tracheostomy status: Secondary | ICD-10-CM | POA: Insufficient documentation

## 2017-07-06 NOTE — Progress Notes (Signed)
Tracheostomy Procedure Note  Derrick HansenReginald A [name] [id number] Jul 26, 1981  Pre Procedure Tracheostomy Information  Trach Brand: bivona Size: 6.0 Style: Uncuffed Secured by: Velcro   Procedure: trach change    Post Procedure Tracheostomy Information  Trach Brand: bivona Size: 6.0 Style: Uncuffed Secured by: Velcro   Post Procedure Evaluation:  ETCO2 positive color change from yellow to purple : Yes.   Vital signs:blood pressure 154/90, pulse 93, respirations 18 and pulse oximetry 97 % Patients current condition: stable Complications: No apparent complications Trach site exam: clean, dry Wound care done: dry and 4 x 4 gauze Patient did tolerate procedure well.   Education: none  Prescription needs: none    Additional needs: none

## 2017-07-13 ENCOUNTER — Encounter: Payer: Self-pay | Admitting: Internal Medicine

## 2017-08-07 ENCOUNTER — Other Ambulatory Visit: Payer: Self-pay | Admitting: Internal Medicine

## 2017-08-07 MED ORDER — VITAMIN D (ERGOCALCIFEROL) 1.25 MG (50000 UNIT) PO CAPS: 50000.0000 [IU] | ORAL_CAPSULE | ORAL | 0 refills | Status: DC

## 2017-08-07 MED ORDER — ALLOPURINOL 300 MG PO TABS: 300.0000 mg | ORAL_TABLET | Freq: Every day | ORAL | 0 refills | Status: DC

## 2017-08-08 ENCOUNTER — Ambulatory Visit (HOSPITAL_COMMUNITY)
Admission: RE | Admit: 2017-08-08 | Discharge: 2017-08-08 | Disposition: A | Discharge status: Home / Self Care | Payer: BLUE CROSS/BLUE SHIELD | Source: Ambulatory Visit | Attending: Acute Care | Admitting: Acute Care

## 2017-08-08 DIAGNOSIS — Z430 Encounter for attention to tracheostomy: Secondary | ICD-10-CM | POA: Diagnosis present

## 2017-08-08 DIAGNOSIS — G4733 Obstructive sleep apnea (adult) (pediatric): Secondary | ICD-10-CM | POA: Diagnosis not present

## 2017-08-08 DIAGNOSIS — Z930 Tracheostomy status: Secondary | ICD-10-CM | POA: Diagnosis not present

## 2017-08-08 DIAGNOSIS — E669 Obesity, unspecified: Secondary | ICD-10-CM | POA: Diagnosis not present

## 2017-08-08 DIAGNOSIS — E662 Morbid (severe) obesity with alveolar hypoventilation: Secondary | ICD-10-CM | POA: Diagnosis not present

## 2017-08-08 NOTE — Progress Notes (Signed)
[facility]   Reason for visit: Follow up trach care and planned trach change  HPI: Derrick [name]  returns today for routine tracheostomy change.  No new complaints, he is actually lost 12 pounds since his last medical appointment.  No issues from a tracheostomy standpoint.  ROS General: No fevers, chills, weakness.  HEENT: No headache, nasal congestion, sore throat.  Pulmonary: No shortness of breath, cough, wheezing.  Cardiac: No palpitations, chest pain, or orthopnea.  Musculoskeletal: Normal abdomen: Negative neuro: Negative endocrine: Negative  Vital signs: HR 94 144/91 rr 17 sats 97%   Exam: This is an obese 37 year old male currently resting comfortably in no acute distress HEENT: Normocephalic atraumatic mucous membranes moist.  He has a #6 Bivona tracheostomy in place.  His stoma is unremarkable without significant discharge. Pulmonary: Clear to auscultation without accessory use Cardiac: Regular rate and rhythm without murmur rub or Abdomen: Soft nontender no organomegaly Extremities: Warm dry, no edema Neuro: Awake alert oriented no focal deficits  Trach change/procedure # 6 biovona uncuffed trach changed w/out difficulty.  Trach stoma unremarkable.    Impression/dx Severe OSA Severe OHS HTN Morbid obesity Trach dependence   Discussion Derrick [name] is doing excellent from a trach stand-point. No new issues. Still loosing weight and working towards his short term goal of going for gastric bypass and then long term w/ hope to decannulate.   Plan ROV 5 weeks for routine trach change Will be available to assist w/ care prior to gastric bypass. He would need temporary cuffed trach for general anesthesia.       Visit time: 35 minutes.   Simonne MartinetPeter E Babcock ACNP-BC [facility]

## 2017-08-08 NOTE — Progress Notes (Signed)
Tracheostomy Procedure Note  Derrick HansenReginald A [name] [id number] Dec 01, 1980  Pre Procedure Tracheostomy Information  Trach Brand: Bivona Size: 6.0 Style: Uncuffed Secured by: Velcro   Procedure: trach change    Post Procedure Tracheostomy Information  Trach Brand: Bivona Size: 6.0 Style: Uncuffed Secured by: Velcro   Post Procedure Evaluation:  ETCO2 positive color change from yellow to purple : Yes.   Vital signs:blood pressure 144/91, pulse 94, respirations 17 and pulse oximetry 97 % Patients current condition: stable Complications: No apparent complications Trach site exam: clean, dry Wound care done: 4 x 4 gauze Patient did tolerate procedure well.   Education: none  Prescription needs: none    Additional needs: 5 week follow up

## 2017-08-09 ENCOUNTER — Ambulatory Visit (HOSPITAL_COMMUNITY): Payer: BLUE CROSS/BLUE SHIELD

## 2017-09-08 ENCOUNTER — Other Ambulatory Visit: Payer: Self-pay | Admitting: Internal Medicine

## 2017-09-12 ENCOUNTER — Ambulatory Visit (HOSPITAL_COMMUNITY)
Admission: RE | Admit: 2017-09-12 | Discharge: 2017-09-12 | Disposition: A | Discharge status: Home / Self Care | Payer: BLUE CROSS/BLUE SHIELD | Source: Ambulatory Visit | Attending: Acute Care | Admitting: Acute Care

## 2017-09-12 DIAGNOSIS — Z4682 Encounter for fitting and adjustment of non-vascular catheter: Secondary | ICD-10-CM | POA: Insufficient documentation

## 2017-09-12 DIAGNOSIS — G4733 Obstructive sleep apnea (adult) (pediatric): Secondary | ICD-10-CM | POA: Insufficient documentation

## 2017-09-12 DIAGNOSIS — Z4589 Encounter for adjustment and management of other implanted devices: Secondary | ICD-10-CM | POA: Insufficient documentation

## 2017-09-12 DIAGNOSIS — Z930 Tracheostomy status: Secondary | ICD-10-CM

## 2017-09-12 DIAGNOSIS — J9610 Chronic respiratory failure, unspecified whether with hypoxia or hypercapnia: Secondary | ICD-10-CM | POA: Diagnosis not present

## 2017-09-12 NOTE — Progress Notes (Signed)
Tracheostomy Procedure Note  Derrick HansenReginald A [name] [id number] 17-Feb-1981  Pre Procedure Tracheostomy Information  Trach Brand: bivona Size: 6.0 Style: Uncuffed Secured by: Velcro   Procedure: trach change    Post Procedure Tracheostomy Information  Trach Brand: [place] Size: 6.0 Style: Uncuffed Secured by: Velcro   Post Procedure Evaluation:  ETCO2 positive color change from yellow to purple : Yes.   Vital signs:blood pressure 134/82, pulse 109, respirations 15 and pulse oximetry 95 % Patients current condition: stable Complications: No apparent complications Trach site exam: clean, dry Wound care done: dry and 4 x 4 gauze Patient did tolerate procedure well.   Education: none  Prescription needs: none    Additional needs: 4 weeks

## 2017-09-12 NOTE — Progress Notes (Signed)
  [facility]   Reason for visit: Routine tracheostomy change  HPI: 37 year old male patient well-known to me.  Followed by the tracheostomy clinic for tracheostomy care in the setting of chronic respiratory failure and OHS OSA.  He has been focusing on weight loss in hopes of soon getting gastric bypass surgery  ROS General: No fever, chills, sick exposures HEENT: No headache, nasal congestion, sore throat.  Pulmonary: No chest pain, shortness of breath, no cough, no wheezing.  Cardiac: No palpitations no chest pain extremities:/Musculoskeletal: No pain, no swelling, abdomen: No nausea vomiting diarrhea GU: Voiding neuro/psych: Baseline  Vital signs: Heart rate 109 respirations 15 saturations 95% on room air blood pressure 134/82  Exam: General: This a pleasant 37 year old male currently in no acute distress.  He ambulated into the clinic without difficulty. HEENT: Normocephalic atraumatic.  #6 Bivona trach unremarkable, stoma is unremarkable, without drainage or discharge. Pulmonary: Clear to auscultation without accessory use Cardiac: Regular rate and rhythm Abdomen: Soft nontender Extremities: Warm and dry without edema Neuro/psych: Awake oriented no focal deficits  Trach change/procedure #6 Bivona tracheostomy changed without difficulty End-tidal CO2 positive for color change   Impression/dx Trach dependence OSA   Discussion No new recommendations, [name] is doing well, I continue to encourage weight loss and active lifestyle.  Ultimately our goal is to decannulate him once he can safely tolerate CPAP  Plan F/u 4-5 weeks for routine trach change     Visit time: 35 minutes.   Simonne MartinetPeter E Channon Brougher ACNP-BC [facility]

## 2017-09-26 ENCOUNTER — Encounter: Payer: Self-pay | Admitting: Internal Medicine

## 2017-09-26 ENCOUNTER — Ambulatory Visit: Payer: BLUE CROSS/BLUE SHIELD | Admitting: Internal Medicine

## 2017-09-26 VITALS — BP 120/82 | HR 85 | Temp 98.0°F | Ht 70.0 in | Wt >= 6400 oz

## 2017-09-26 DIAGNOSIS — H9319 Tinnitus, unspecified ear: Secondary | ICD-10-CM | POA: Diagnosis not present

## 2017-09-26 DIAGNOSIS — H9313 Tinnitus, bilateral: Secondary | ICD-10-CM | POA: Diagnosis not present

## 2017-09-26 DIAGNOSIS — L709 Acne, unspecified: Secondary | ICD-10-CM

## 2017-09-26 DIAGNOSIS — J40 Bronchitis, not specified as acute or chronic: Secondary | ICD-10-CM | POA: Diagnosis not present

## 2017-09-26 MED ORDER — BENZONATATE 200 MG PO CAPS: 200.0000 mg | ORAL_CAPSULE | Freq: Three times a day (TID) | ORAL | 0 refills | Status: DC | PRN

## 2017-09-26 MED ORDER — DOXYCYCLINE HYCLATE 100 MG PO TABS: 100.0000 mg | ORAL_TABLET | Freq: Two times a day (BID) | ORAL | 0 refills | Status: DC

## 2017-09-26 NOTE — Progress Notes (Signed)
   Subjective:    Patient ID: Derrick [name], male    DOB: [birth year], 37 y.o.   MRN: [id number]  HPI The patient is a 37 YO man coming in for nose congestion and cough. He does have a trach and is having more secretions as well as thicker secretions. He is having this for 2-3 weeks. Denies headaches but having sinus pressure. Denies SOB. Is coughing productive. 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 He is worsening overall. He has tried his singulair which he takes daily but is not taking xyzal right now, using albuterol prn not much lately. Out of flonase right now. Also wants treatment for his acne (having this for many years, tried many otc products, wants to go see dermatology for better treatment of this, does have scarring acne, on chin and nose).   Review of Systems  Constitutional: Positive for activity change, appetite change and chills. Negative for fatigue, fever and unexpected weight change.  HENT: Positive for congestion, ear pain, hearing loss, postnasal drip, rhinorrhea, sinus pressure, sinus pain and tinnitus. Negative for ear discharge, sneezing, sore throat, trouble swallowing and voice change.        Acne  Eyes: Negative.   Respiratory: Positive for cough. Negative for chest tightness, shortness of breath and wheezing.   Cardiovascular: Negative.   Gastrointestinal: Negative.   Musculoskeletal: Positive for myalgias.  Skin:       Acne   Neurological: Positive for numbness.  Psychiatric/Behavioral: Negative.       Objective:   Physical Exam  Constitutional: He is oriented to person, place, and time. He appears well-developed and well-nourished.  HENT:  Head: Normocephalic and atraumatic.  Oropharynx with redness and clear drainage, nose with swollen turbinates and yellow crusting, frontal sinus pain, TMs  normal bilaterally  Eyes: EOM are normal.  Neck: Normal range of motion. No thyromegaly present.  Janina Mayorach, he denies irritation but declines examination  Cardiovascular: Normal rate and regular rhythm.  Pulmonary/Chest: Effort normal and breath sounds normal. No respiratory distress. He has no wheezes. He has no rales.  Abdominal: Soft.  Musculoskeletal: He exhibits tenderness.  Lymphadenopathy:    He has no cervical adenopathy.  Neurological: He is alert and oriented to person, place, and time.  Skin: Skin is warm and dry.  Acne on the nose and chin, with scarring from old healed lesions.   Vitals:   09/26/17 1056  BP: 120/82  Pulse: 85  Temp: 98 F (36.7 C)  TempSrc: Oral  SpO2: 92%  Weight: (!) 404 lb (183.3 kg)  Height: 5\' 10"  (1.778 m)      Assessment & Plan:

## 2017-09-26 NOTE — Patient Instructions (Addendum)
We have sent in the doxycycline to take 1 pill twice a day for 1 week.  We have sent in the cough medicine tessalon.   We will get you in with the skin doctor and the ear nose and throat doctor.

## 2017-09-27 ENCOUNTER — Ambulatory Visit: Payer: BLUE CROSS/BLUE SHIELD | Admitting: Internal Medicine

## 2017-09-27 DIAGNOSIS — H9319 Tinnitus, unspecified ear: Secondary | ICD-10-CM | POA: Insufficient documentation

## 2017-09-27 DIAGNOSIS — J40 Bronchitis, not specified as acute or chronic: Secondary | ICD-10-CM | POA: Insufficient documentation

## 2017-09-27 NOTE — Assessment & Plan Note (Signed)
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 No fluid in TM on exam and no excessive wax. Referral to ENT for hearing evaluation. Talked to him about the importance of good ear protection going forward to preserve hearing.

## 2017-09-27 NOTE — Assessment & Plan Note (Signed)
Rx for tessalon perles and doxycycline.

## 2017-09-27 NOTE — Assessment & Plan Note (Signed)
Referral to dermatology per patient request. He has tried many otc products without relief.

## 2017-10-03 ENCOUNTER — Other Ambulatory Visit: Payer: Self-pay | Admitting: Internal Medicine

## 2017-10-10 ENCOUNTER — Ambulatory Visit (HOSPITAL_COMMUNITY): Payer: BLUE CROSS/BLUE SHIELD

## 2017-10-11 ENCOUNTER — Ambulatory Visit (HOSPITAL_COMMUNITY)
Admission: RE | Admit: 2017-10-11 | Discharge: 2017-10-11 | Disposition: A | Discharge status: Home / Self Care | Payer: BLUE CROSS/BLUE SHIELD | Source: Ambulatory Visit | Attending: Acute Care | Admitting: Acute Care

## 2017-10-11 DIAGNOSIS — G4733 Obstructive sleep apnea (adult) (pediatric): Secondary | ICD-10-CM

## 2017-10-11 DIAGNOSIS — I10 Essential (primary) hypertension: Secondary | ICD-10-CM | POA: Diagnosis not present

## 2017-10-11 DIAGNOSIS — Z930 Tracheostomy status: Secondary | ICD-10-CM

## 2017-10-11 DIAGNOSIS — G4730 Sleep apnea, unspecified: Secondary | ICD-10-CM | POA: Diagnosis not present

## 2017-10-11 DIAGNOSIS — Z4682 Encounter for fitting and adjustment of non-vascular catheter: Secondary | ICD-10-CM | POA: Insufficient documentation

## 2017-10-11 NOTE — Progress Notes (Signed)
Tracheostomy Procedure Note  Derrick HansenReginald A [name] [id number] Sep 06, 1980  Pre Procedure Tracheostomy Information  Trach Brand: Bivona Size: 6.0 Style: Uncuffed Secured by: Velcro   Procedure: trach change    Post Procedure Tracheostomy Information  Trach Brand: Bivona Size: 6.0 Style: Uncuffed Secured by: Velcro   Post Procedure Evaluation:  ETCO2 positive color change from yellow to purple : Yes.   Vital signs:blood pressure 137/79, pulse 96, respirations 15 and pulse oximetry 94 % Patients current condition: stable Complications: No apparent complications Trach site exam: clean, dry Wound care done: dry and 4 x 4 gauze Patient did tolerate procedure well.   Education: none  Prescription needs: none    Additional needs: 4 week follow up

## 2017-10-12 NOTE — Progress Notes (Signed)
[facility]   Reason for visit: Routine tracheostomy change HPI: 37 year old male patient well-known to me with a significant history of severe sleep apnea requiring tracheostomy.  And managed for over a year now with size 6 Bivona tracheostomy cuffless.  Presents today for planned tracheostomy change  ROS General: No fever, chills, headache or weakness HEENT denies headache, nasal congestion, cough beyond typical seasonal allergy symptoms, denies sore throat, denies purulent discharge from tracheostomy or tracheostomy stoma discomfort Pulmonary: Denies shortness of breath chest pain wheezing Cardiac: Denies chest pain, palpitations, or any significant lower extremity swelling Extremities/musculoskeletal: Denies pain, focal motor weakness, swelling, or altered sensation.  Abdomen: Denies nausea vomiting diarrhea.  Continues to have purposeful weight loss GU: No issues Neuro: No gait disturbances, seizures, strength loss.  Or memory loss.  Endocrine: No hot or cold abnormalities.  Also any hair loss changes  Vital signs: Heart rate 97 blood pressure 137/79 respirations 15 saturations 94% on room air  Exam: General: This is a 37 year old African-American male, ambulatory, and doing well.  He is in no acute distress, denies new issues. HEENT: Normocephalic atraumatic.  Tracheostomy stoma is unremarkable.  He phonates well with Passy-Muir valve Pulmonary: Clear to auscultation, no accessory use Cardiac: Regular rate and rhythm Abdomen: Soft nontender  Extremities: Equal strength and bulk Neuro: Awake and oriented no focal deficits   Trach change/procedure Size 6 Bivona brand tracheostomy was removed from the tracheostomy site.  Tracheostomy site was assessed, and negative for foul smelling or discolored tracheal discharge.  A new size 6 Bivona tracheostomy was placed without difficulty end-tidal CO2 checked and positive   Impression/dx Tracheostomy  dependence Severe sleep apnea Morbid obesity Hypertension  Discussion Patient continues to progress.  He is still awaiting his surgical consultation for gastric bypass surgery.  In the meantime he is continued to exercise, lose weight, and live a healthier lifestyle.  Plan Follow-up 4 weeks for routine Bivona change Ideally after surgery we can orchestrate a sleep study and see if he can tolerate CPAP, if he can then we can proceed with decannulation.    Visit time: 30 minutes.   Simonne MartinetPeter E Shyenne Maggard ACNP-BC [facility]

## 2017-11-07 ENCOUNTER — Other Ambulatory Visit: Payer: Self-pay

## 2017-11-07 ENCOUNTER — Inpatient Hospital Stay (HOSPITAL_COMMUNITY): Admission: RE | Admit: 2017-11-07 | Payer: BLUE CROSS/BLUE SHIELD | Source: Ambulatory Visit

## 2017-11-07 ENCOUNTER — Emergency Department (HOSPITAL_BASED_OUTPATIENT_CLINIC_OR_DEPARTMENT_OTHER)
Admission: EM | Admit: 2017-11-07 | Discharge: 2017-11-07 | Disposition: A | Discharge status: Home / Self Care | Payer: BLUE CROSS/BLUE SHIELD | Attending: Emergency Medicine | Admitting: Emergency Medicine

## 2017-11-07 ENCOUNTER — Encounter (HOSPITAL_BASED_OUTPATIENT_CLINIC_OR_DEPARTMENT_OTHER): Payer: Self-pay | Admitting: *Deleted

## 2017-11-07 DIAGNOSIS — I5032 Chronic diastolic (congestive) heart failure: Secondary | ICD-10-CM | POA: Insufficient documentation

## 2017-11-07 DIAGNOSIS — I110 Hypertensive heart disease with heart failure: Secondary | ICD-10-CM | POA: Diagnosis not present

## 2017-11-07 DIAGNOSIS — Z7984 Long term (current) use of oral hypoglycemic drugs: Secondary | ICD-10-CM | POA: Insufficient documentation

## 2017-11-07 DIAGNOSIS — L089 Local infection of the skin and subcutaneous tissue, unspecified: Secondary | ICD-10-CM | POA: Diagnosis not present

## 2017-11-07 DIAGNOSIS — Z79899 Other long term (current) drug therapy: Secondary | ICD-10-CM | POA: Diagnosis not present

## 2017-11-07 DIAGNOSIS — E119 Type 2 diabetes mellitus without complications: Secondary | ICD-10-CM | POA: Insufficient documentation

## 2017-11-07 DIAGNOSIS — R220 Localized swelling, mass and lump, head: Secondary | ICD-10-CM

## 2017-11-07 MED ORDER — TRAMADOL HCL 50 MG PO TABS: 50.0000 mg | ORAL_TABLET | Freq: Four times a day (QID) | ORAL | 0 refills | Status: DC | PRN

## 2017-11-07 MED ORDER — LIDOCAINE HCL 2 % IJ SOLN
20.0000 mL | Freq: Once | INTRAMUSCULAR | Status: AC
  Administered 2017-11-07: 400 mg via INTRADERMAL
  Filled 2017-11-07: qty 20

## 2017-11-07 MED ORDER — CEPHALEXIN 500 MG PO CAPS: 1000.0000 mg | ORAL_CAPSULE | Freq: Two times a day (BID) | ORAL | 0 refills | Status: DC

## 2017-11-07 MED FILL — CEPHALEXIN 500 MG CAPSULE: 500 | 4 days supply | Qty: 28 | Fill #0

## 2017-11-07 MED FILL — traMADol HCL 50 MG TABS: 50 | 2 days supply | Qty: 10 | Fill #0

## 2017-11-07 NOTE — ED Provider Notes (Signed)
[facility] EMERGENCY DEPARTMENT Provider Note   CSN: [id number] Arrival date & time: 11/07/17  1514     History   Chief Complaint Chief Complaint  Patient presents with  . Facial Swelling    HPI Derrick [name] is a 37 y.o. male.  HPI Patient presents to the emergency department with what he thought was an ingrown hair that he states that he was able to remove the hair from the area on Sunday but then the area kept getting more swollen over time.  Patient states that the area is more painful and the area is more swollen.  The patient states that he did not take any other medications for this issue.  The patient denies chest pain, shortness of breath, headache,blurred vision, neck pain, fever, cough, weakness, numbness, dizziness, anorexia, edema, abdominal pain, nausea, vomiting, diarrhea, rash, back pain, dysuria, hematemesis, bloody stool, near syncope, or syncope. Past Medical History:  Diagnosis Date  . Acute on chronic diastolic CHF (congestive heart failure), NYHA class 1 ([place])   . Diabetes mellitus without complication ([place])   . GERD (gastroesophageal reflux disease)   . Hypertension   . Reflux     Patient Active Problem List   Diagnosis Date Noted  . Tinnitus 09/27/2017  . Bronchitis 09/27/2017  . Numbness and tingling of hand 11/30/2016  . Tracheostomy dependence ([place])   . Routine general medical examination at a health care facility 09/21/2016  . Acne 09/21/2016  . ED (erectile dysfunction) 06/02/2016  . Other allergic rhinitis   . Diabetes mellitus type 2 with complications ([place]) 35/59/7416  . Left ankle pain 08/11/2015  . Dysphagia   . Pulmonary hypertension ([place])   . Morbid obesity ([place]) 06/13/2015  . Essential hypertension 06/13/2015  . OSA (obstructive sleep apnea) 06/13/2015  . Obesity hypoventilation syndrome ([place]) 06/13/2015  . Chronic diastolic heart failure ([place]) 06/13/2015    Past Surgical History:  Procedure Laterality Date  .  TRACHEOSTOMY TUBE PLACEMENT N/A 06/21/2015   Procedure: TRACHEOSTOMY;  Surgeon: Leta Baptist, MD;  Location: [facility] OR;  Service: ENT;  Laterality: N/A;        Home Medications    Prior to Admission medications   Medication Sig Start Date End Date Taking? Authorizing Provider  albuterol (PROVENTIL HFA;VENTOLIN HFA) 108 (90 Base) MCG/ACT inhaler Inhale 2 puffs into the lungs every 6 (six) hours as needed for wheezing or shortness of breath. 02/20/17   Golden Circle, FNP  allopurinol (ZYLOPRIM) 300 MG tablet TAKE 1 TABLET BY MOUTH ONCE DAILY 10/03/17   Hoyt Koch, MD  amLODipine (NORVASC) 10 MG tablet TAKE ONE TABLET BY MOUTH ONCE DAILY 02/22/17   Golden Circle, FNP  benzonatate (TESSALON) 200 MG capsule Take 1 capsule (200 mg total) by mouth 3 (three) times daily as needed for cough. 09/26/17   Hoyt Koch, MD  Blood Glucose Monitoring Suppl (BAYER CONTOUR NEXT MONITOR) w/Device KIT Use to check blood sugars daily Dx E11.9 10/16/16   Hoyt Koch, MD  cetirizine (ZYRTEC) 10 MG tablet Take 1 tablet (10 mg total) by mouth daily. 09/01/16   Hoyt Koch, MD  doxycycline (VIBRA-TABS) 100 MG tablet Take 1 tablet (100 mg total) by mouth 2 (two) times daily. 09/26/17   Hoyt Koch, MD  Dulaglutide (TRULICITY) 1.5 LA/4.5XM SOPN Inject 1.5 mg into the skin once a week. 09/18/16   Hoyt Koch, MD  famotidine (PEPCID) 20 MG tablet TAKE ONE TABLET BY MOUTH ONCE DAILY 09/04/16  Hoyt Koch, MD  fluticasone [facility]) 50 MCG/ACT nasal spray Place 2 sprays into both nostrils daily. 09/16/15   Erick Colace, NP  fluticasone (FLOVENT HFA) 110 MCG/ACT inhaler Inhale 2 puffs into the lungs 2 (two) times daily. 02/20/17   Golden Circle, FNP  furosemide (LASIX) 80 MG tablet TAKE 1 TABLET BY MOUTH 4 TIMES DAILY 10/03/17   Hoyt Koch, MD  gabapentin (NEURONTIN) 300 MG capsule TAKE 2 CAPSULES BY MOUTH 4 TIMES DAILY 06/20/17   Hoyt Koch,  MD  glipiZIDE (GLUCOTROL XL) 10 MG 24 hr tablet Take 1 tablet (10 mg total) by mouth daily with breakfast. 11/23/16   Hoyt Koch, MD  glucose blood test strip Use as instructed 11/30/16   Hoyt Koch, MD  guaiFENesin (MUCINEX) 600 MG 12 hr tablet Take 1 tablet (600 mg total) by mouth 2 (two) times daily. 09/18/16   Hoyt Koch, MD  liraglutide (VICTOZA) 18 MG/3ML SOPN Inject 0.3 mLs (1.8 mg total) into the skin daily. 10/09/16   Hoyt Koch, MD  metFORMIN (GLUCOPHAGE) 500 MG tablet Take 1 tablet (500 mg total) by mouth daily with breakfast. 02/03/16   Hoyt Koch, MD  metoprolol tartrate (LOPRESSOR) 50 MG tablet TAKE 1 TABLET BY MOUTH TWICE DAILY 10/03/17   Hoyt Koch, MD  montelukast (SINGULAIR) 10 MG tablet Take 1 tablet (10 mg total) by mouth at bedtime. 11/23/16   Hoyt Koch, MD  nitroGLYCERIN (NITROSTAT) 0.4 MG SL tablet Place 0.4 mg under the tongue every 5 (five) minutes as needed for chest pain.    [provider]  Jonetta Speak LANCETS 57D MISC Use as needed daily 11/30/16   Hoyt Koch, MD  potassium chloride SA (K-DUR,KLOR-CON) 20 MEQ tablet Take 1 tablet (20 mEq total) by mouth 3 (three) times daily. 02/03/16   Hoyt Koch, MD  ranitidine (ZANTAC) 150 MG capsule Take 1 capsule (150 mg total) by mouth 2 (two) times daily. 11/23/16   Hoyt Koch, MD  sildenafil (VIAGRA) 100 MG tablet Take 0.5-1 tablets (50-100 mg total) by mouth daily as needed for erectile dysfunction. 04/18/17   Hoyt Koch, MD  Tracheostomy Care KIT 30 kits by Does not apply route daily. 09/27/15   Erick Colace, NP  triamterene-hydrochlorothiazide (MAXZIDE-25) 37.5-25 MG tablet Take 1 tablet by mouth daily. 03/31/16   Hoyt Koch, MD  Vitamin D, Ergocalciferol, (DRISDOL) 50000 units CAPS capsule Take 1 capsule (50,000 Units total) by mouth every 7 (seven) days. 08/07/17   Hoyt Koch, MD     Family History Family History  Problem Relation Age of Onset  . Diabetes Maternal Grandmother   . Hypertension Maternal Grandmother     Social History Social History   Tobacco Use  . Smoking status: Never Smoker  . Smokeless tobacco: Never Used  Substance Use Topics  . Alcohol use: No  . Drug use: No     Allergies   Shrimp [shellfish allergy]; Vancomycin; and Zosyn [piperacillin sod-tazobactam so]   Review of Systems Review of Systems All other systems negative except as documented in the HPI. All pertinent positives and negatives as reviewed in the HPI.  Physical Exam Updated Vital Signs BP (!) 141/81   Pulse 99   Temp 98.4 F (36.9 C) (Oral)   Resp 20   Ht 5' 10" (1.778 m)   Wt (!) 180.5 kg (398 lb)   SpO2 98%   BMI 57.11 kg/m  Physical Exam  Constitutional: He is oriented to person, place, and time. He appears well-developed and well-nourished. No distress.  HENT:  Head: Normocephalic and atraumatic.    Eyes: Pupils are equal, round, and reactive to light.  Pulmonary/Chest: Effort normal.  Neurological: He is alert and oriented to person, place, and time.  Skin: Skin is warm and dry.  Psychiatric: He has a normal mood and affect.  Nursing note and vitals reviewed.    ED Treatments / Results  Labs (all labs ordered are listed, but only abnormal results are displayed) Labs Reviewed - No data to display  EKG None  Radiology No results found.  Procedures Procedures (including critical care time)  Medications Ordered in ED Medications  lidocaine (XYLOCAINE) 2 % (with pres) injection 400 mg (400 mg Intradermal Given 11/07/17 1621)     Initial Impression / Assessment and Plan / ED Course  I have reviewed the triage vital signs and the nursing notes.  Pertinent labs & imaging results that were available during my care of the patient were reviewed by me and considered in my medical decision making (see chart for details).    INCISION AND  DRAINAGE Performed by: Resa Miner Kennisha Qin Consent: Verbal consent obtained. Risks and benefits: risks, benefits and alternatives were discussed Type: abscess  Body area: R upper face at the hairline  Anesthesia: local infiltration  Incision was made with a scalpel.  Local anesthetic: lidocaine 2 % without epinephrine  Anesthetic total: 1 ml  Complexity: complex  Drainage: purulent  Drainage amount: Small   Patient tolerance: Patient tolerated the procedure well with no immediate complications.   Patient had a small amount of purulent material drained from the area.  I did advise him to use warm compresses around the area also advised him to keep the area clean and dry.  Final Clinical Impressions(s) / ED Diagnoses   Final diagnoses:  None    ED Discharge Orders    None       Dalia Heading, PA-C 11/07/17 1718    Tegeler, Gwenyth Allegra, MD 11/08/17 [phone]

## 2017-11-07 NOTE — ED Triage Notes (Signed)
Pt c/o right facial swelling x 1 day

## 2017-11-07 NOTE — Discharge Instructions (Signed)
Keep the area clean and dry.  Use warm compresses around the area to help promote any further drainage.

## 2017-11-08 ENCOUNTER — Other Ambulatory Visit: Payer: Self-pay

## 2017-11-08 ENCOUNTER — Emergency Department (HOSPITAL_BASED_OUTPATIENT_CLINIC_OR_DEPARTMENT_OTHER)
Admission: EM | Admit: 2017-11-08 | Discharge: 2017-11-08 | Disposition: A | Discharge status: Home / Self Care | Payer: BLUE CROSS/BLUE SHIELD | Attending: Emergency Medicine | Admitting: Emergency Medicine

## 2017-11-08 ENCOUNTER — Encounter (HOSPITAL_BASED_OUTPATIENT_CLINIC_OR_DEPARTMENT_OTHER): Payer: Self-pay

## 2017-11-08 DIAGNOSIS — L089 Local infection of the skin and subcutaneous tissue, unspecified: Secondary | ICD-10-CM

## 2017-11-08 DIAGNOSIS — R600 Localized edema: Secondary | ICD-10-CM | POA: Diagnosis present

## 2017-11-08 DIAGNOSIS — I5030 Unspecified diastolic (congestive) heart failure: Secondary | ICD-10-CM | POA: Diagnosis not present

## 2017-11-08 DIAGNOSIS — Z79899 Other long term (current) drug therapy: Secondary | ICD-10-CM | POA: Insufficient documentation

## 2017-11-08 DIAGNOSIS — I110 Hypertensive heart disease with heart failure: Secondary | ICD-10-CM | POA: Diagnosis not present

## 2017-11-08 DIAGNOSIS — E119 Type 2 diabetes mellitus without complications: Secondary | ICD-10-CM | POA: Diagnosis not present

## 2017-11-08 MED ORDER — MUPIROCIN CALCIUM 2 % EX CREA: 1.0000 "application " | TOPICAL_CREAM | Freq: Two times a day (BID) | CUTANEOUS | 0 refills | Status: DC

## 2017-11-08 MED FILL — MUPIROCIN 2% OINTMENT: 2 | 10 days supply | Qty: 22 | Fill #0

## 2017-11-08 NOTE — ED Provider Notes (Signed)
[facility] EMERGENCY DEPARTMENT Provider Note   CSN: [id number] Arrival date & time: 11/08/17  1210     History   Chief Complaint Chief Complaint  Patient presents with  . Facial Swelling    HPI Derrick [name] is a 37 y.o. male.  Patient with history of diabetes, congestive heart failure --presents the emergency department for recheck of a right facial pustule that was incised yesterday.  Patient noted a mild increase of swelling around his right eye overnight.  He has had 2 doses of the Keflex and 2 doses of the tramadol prescribed yesterday.  He did not have any lip or tongue swelling, trouble breathing, vomiting or diarrhea, hives associated with these medications.  He denies any fevers.  No vision changes.  No eye pain.  Patient complains of generalized facial pain from the area.       Past Medical History:  Diagnosis Date  . Acute on chronic diastolic CHF (congestive heart failure), NYHA class 1 ([place])   . Diabetes mellitus without complication ([place])   . GERD (gastroesophageal reflux disease)   . Hypertension   . Reflux     Patient Active Problem List   Diagnosis Date Noted  . Tinnitus 09/27/2017  . Bronchitis 09/27/2017  . Numbness and tingling of hand 11/30/2016  . Tracheostomy dependence ([place])   . Routine general medical examination at a health care facility 09/21/2016  . Acne 09/21/2016  . ED (erectile dysfunction) 06/02/2016  . Other allergic rhinitis   . Diabetes mellitus type 2 with complications ([place]) 01/00/7121  . Left ankle pain 08/11/2015  . Dysphagia   . Pulmonary hypertension ([place])   . Morbid obesity ([place]) 06/13/2015  . Essential hypertension 06/13/2015  . OSA (obstructive sleep apnea) 06/13/2015  . Obesity hypoventilation syndrome ([place]) 06/13/2015  . Chronic diastolic heart failure ([place]) 06/13/2015    Past Surgical History:  Procedure Laterality Date  . TRACHEOSTOMY TUBE PLACEMENT N/A 06/21/2015   Procedure: TRACHEOSTOMY;   Surgeon: Leta Baptist, MD;  Location: [facility] OR;  Service: ENT;  Laterality: N/A;        Home Medications    Prior to Admission medications   Medication Sig Start Date End Date Taking? Authorizing Provider  albuterol (PROVENTIL HFA;VENTOLIN HFA) 108 (90 Base) MCG/ACT inhaler Inhale 2 puffs into the lungs every 6 (six) hours as needed for wheezing or shortness of breath. 02/20/17   Golden Circle, FNP  allopurinol (ZYLOPRIM) 300 MG tablet TAKE 1 TABLET BY MOUTH ONCE DAILY 10/03/17   Hoyt Koch, MD  amLODipine (NORVASC) 10 MG tablet TAKE ONE TABLET BY MOUTH ONCE DAILY 02/22/17   Golden Circle, FNP  benzonatate (TESSALON) 200 MG capsule Take 1 capsule (200 mg total) by mouth 3 (three) times daily as needed for cough. 09/26/17   Hoyt Koch, MD  Blood Glucose Monitoring Suppl (BAYER CONTOUR NEXT MONITOR) w/Device KIT Use to check blood sugars daily Dx E11.9 10/16/16   Hoyt Koch, MD  cephALEXin (KEFLEX) 500 MG capsule Take 2 capsules (1,000 mg total) by mouth 2 (two) times daily. 11/07/17   Lawyer, Harrell Gave, PA-C  cetirizine (ZYRTEC) 10 MG tablet Take 1 tablet (10 mg total) by mouth daily. 09/01/16   Hoyt Koch, MD  doxycycline (VIBRA-TABS) 100 MG tablet Take 1 tablet (100 mg total) by mouth 2 (two) times daily. 09/26/17   Hoyt Koch, MD  Dulaglutide (TRULICITY) 1.5 FX/5.8IT SOPN Inject 1.5 mg into the skin once a week. 09/18/16  Hoyt Koch, MD  famotidine (PEPCID) 20 MG tablet TAKE ONE TABLET BY MOUTH ONCE DAILY 09/04/16   Hoyt Koch, MD  fluticasone [facility]) 50 MCG/ACT nasal spray Place 2 sprays into both nostrils daily. 09/16/15   Erick Colace, NP  fluticasone (FLOVENT HFA) 110 MCG/ACT inhaler Inhale 2 puffs into the lungs 2 (two) times daily. 02/20/17   Golden Circle, FNP  furosemide (LASIX) 80 MG tablet TAKE 1 TABLET BY MOUTH 4 TIMES DAILY 10/03/17   Hoyt Koch, MD  gabapentin (NEURONTIN) 300 MG capsule TAKE 2  CAPSULES BY MOUTH 4 TIMES DAILY 06/20/17   Hoyt Koch, MD  glipiZIDE (GLUCOTROL XL) 10 MG 24 hr tablet Take 1 tablet (10 mg total) by mouth daily with breakfast. 11/23/16   Hoyt Koch, MD  glucose blood test strip Use as instructed 11/30/16   Hoyt Koch, MD  guaiFENesin (MUCINEX) 600 MG 12 hr tablet Take 1 tablet (600 mg total) by mouth 2 (two) times daily. 09/18/16   Hoyt Koch, MD  liraglutide (VICTOZA) 18 MG/3ML SOPN Inject 0.3 mLs (1.8 mg total) into the skin daily. 10/09/16   Hoyt Koch, MD  metFORMIN (GLUCOPHAGE) 500 MG tablet Take 1 tablet (500 mg total) by mouth daily with breakfast. 02/03/16   Hoyt Koch, MD  metoprolol tartrate (LOPRESSOR) 50 MG tablet TAKE 1 TABLET BY MOUTH TWICE DAILY 10/03/17   Hoyt Koch, MD  montelukast (SINGULAIR) 10 MG tablet Take 1 tablet (10 mg total) by mouth at bedtime. 11/23/16   Hoyt Koch, MD  mupirocin cream (BACTROBAN) 2 % Apply 1 application topically 2 (two) times daily. 11/08/17   Carlisle Cater, PA-C  nitroGLYCERIN (NITROSTAT) 0.4 MG SL tablet Place 0.4 mg under the tongue every 5 (five) minutes as needed for chest pain.    [provider]  Jonetta Speak LANCETS 25J MISC Use as needed daily 11/30/16   Hoyt Koch, MD  potassium chloride SA (K-DUR,KLOR-CON) 20 MEQ tablet Take 1 tablet (20 mEq total) by mouth 3 (three) times daily. 02/03/16   Hoyt Koch, MD  ranitidine (ZANTAC) 150 MG capsule Take 1 capsule (150 mg total) by mouth 2 (two) times daily. 11/23/16   Hoyt Koch, MD  sildenafil (VIAGRA) 100 MG tablet Take 0.5-1 tablets (50-100 mg total) by mouth daily as needed for erectile dysfunction. 04/18/17   Hoyt Koch, MD  Tracheostomy Care KIT 30 kits by Does not apply route daily. 09/27/15   Erick Colace, NP  traMADol (ULTRAM) 50 MG tablet Take 1 tablet (50 mg total) by mouth every 6 (six) hours as needed for severe pain.  11/07/17   Lawyer, Harrell Gave, PA-C  triamterene-hydrochlorothiazide (MAXZIDE-25) 37.5-25 MG tablet Take 1 tablet by mouth daily. 03/31/16   Hoyt Koch, MD  Vitamin D, Ergocalciferol, (DRISDOL) 50000 units CAPS capsule Take 1 capsule (50,000 Units total) by mouth every 7 (seven) days. 08/07/17   Hoyt Koch, MD    Family History Family History  Problem Relation Age of Onset  . Diabetes Maternal Grandmother   . Hypertension Maternal Grandmother     Social History Social History   Tobacco Use  . Smoking status: Never Smoker  . Smokeless tobacco: Never Used  Substance Use Topics  . Alcohol use: No  . Drug use: No     Allergies   Shrimp [shellfish allergy]; Vancomycin; and Zosyn [piperacillin sod-tazobactam so]   Review of Systems Review of Systems  Constitutional:  Negative for fever.  HENT: Positive for facial swelling. Negative for trouble swallowing.   Eyes: Negative for redness.  Respiratory: Negative for shortness of breath, wheezing and stridor.   Cardiovascular: Negative for chest pain.  Gastrointestinal: Negative for nausea and vomiting.  Musculoskeletal: Negative for myalgias.  Skin: Negative for color change and rash.       Positive for pustule  Neurological: Negative for light-headedness.  Hematological: Negative for adenopathy.  Psychiatric/Behavioral: Negative for confusion.     Physical Exam Updated Vital Signs BP (!) 147/101 (BP Location: Right Wrist)   Pulse 90   Temp 98.6 F (37 C) (Oral)   Resp 20   Ht _0  (1.778 m)   Wt (!) 178 kg (392 lb 6.7 oz)   SpO2 97%   BMI 56.31 kg/m   Physical Exam  Constitutional: He appears well-developed and well-nourished.  HENT:  Head: Normocephalic and atraumatic.  Patient with small pustule to the right forehead at the hairline.  Changes from I&D noted.  No active drainage.  There is mild surrounding cellulitis.  There is slight swelling of the upper eyelid without surrounding erythema  or warmth.  Eyes: Conjunctivae are normal.  Eye globe itself appears normal.  Full range of motion of the eye without pain.  Neck: Normal range of motion. Neck supple.  Pulmonary/Chest: No respiratory distress.  Neurological: He is alert.  Skin: Skin is warm and dry.  Psychiatric: He has a normal mood and affect.  Nursing note and vitals reviewed.    ED Treatments / Results  Labs (all labs ordered are listed, but only abnormal results are displayed) Labs Reviewed - No data to display  EKG None  Radiology No results found.  Procedures Procedures (including critical care time)  Medications Ordered in ED Medications - No data to display   Initial Impression / Assessment and Plan / ED Course  I have reviewed the triage vital signs and the nursing notes.  Pertinent labs & imaging results that were available during my care of the patient were reviewed by me and considered in my medical decision making (see chart for details).     Patient seen and examined.  Vital signs reviewed and are as follows: BP (!) 147/101 (BP Location: Right Wrist)   Pulse 90   Temp 98.6 F (37 C) (Oral)   Resp 20   Ht _1  (1.778 m)   Wt (!) 178 kg (392 lb 6.7 oz)   SpO2 97%   BMI 56.31 kg/m   Will add Bactroban ointment.  Counseled to continue warm compresses at home.  Agree with Keflex and pain control.  Pt urged to return with worsening pain, worsening swelling, expanding area of redness or streaking up extremity, fever, or any other concerns. Urged to take complete course of antibiotics as prescribed. Counseled to take pain medications as prescribed.  Encouraged follow-up in 48 hours if not improving, sooner if worsening.  Pt verbalizes understanding and agrees with plan.   Final Clinical Impressions(s) / ED Diagnoses   Final diagnoses:  Pustule   Patient with right facial pustule with associated upper eyelid swelling.  There are no signs of periorbital cellulitis at this time.  No  signs of post septal cellulitis.  Feel that these mild swelling is likely related to edema from his infection on the forehead.  Patient does not have any other signs and symptoms of anaphylaxis, but was instructed to stop medications if these seem to exacerbate his symptoms and return immediately  with any difficulty breathing or shortness of breath.  ED Discharge Orders        Ordered    mupirocin cream (BACTROBAN) 2 %  2 times daily     11/08/17 1334       Carlisle Cater, PA-C 11/08/17 1340    Charlesetta Shanks, MD 11/09/17 1542

## 2017-11-08 NOTE — ED Triage Notes (Signed)
Pt c/o increase in right facial swelling-seen here for same yesterday-NAD-steady gait

## 2017-11-08 NOTE — Discharge Instructions (Signed)
Please read and follow all provided instructions.  Your diagnoses today include:  1. Pustule     Tests performed today include:  Vital signs. See below for your results today.   Medications prescribed:   Bactroban ointment -topical antibiotic ointment  Take any prescribed medications only as directed.   Home care instructions:  Follow any educational materials contained in this packet. Keep affected area above the level of your heart when possible. Wash area gently twice a day with warm soapy water. Do not apply alcohol or hydrogen peroxide. Cover the area if it draining or weeping.   Continue taking antibiotics.  Use warm compresses on the area multiple times a day.  Follow-up instructions: Return to the Emergency Department in 48 hours for a recheck if your symptoms are not significantly improved.   Return instructions:  Return to the Emergency Department if you have:  Fever  Worsening symptoms  Worsening pain  Worsening swelling  Redness of the skin that moves away from the affected area, especially if it streaks away from the affected area   Any other emergent concerns  Your vital signs today were: BP (!) 147/101 (BP Location: Right Wrist)    Pulse 90    Temp 98.6 F (37 C) (Oral)    Resp 20    Ht 5\' 10"  (1.778 m)    Wt (!) 178 kg (392 lb 6.7 oz)    SpO2 97%    BMI 56.31 kg/m  If your blood pressure (BP) was elevated above 135/85 this visit, please have this repeated by your doctor within one month. --------------

## 2017-11-09 ENCOUNTER — Inpatient Hospital Stay (HOSPITAL_COMMUNITY): Admission: RE | Admit: 2017-11-09 | Payer: BLUE CROSS/BLUE SHIELD | Source: Ambulatory Visit

## 2017-11-12 ENCOUNTER — Ambulatory Visit (HOSPITAL_COMMUNITY)
Admission: RE | Admit: 2017-11-12 | Discharge: 2017-11-12 | Disposition: A | Discharge status: Home / Self Care | Payer: BLUE CROSS/BLUE SHIELD | Source: Ambulatory Visit | Attending: Acute Care | Admitting: Acute Care

## 2017-11-12 ENCOUNTER — Ambulatory Visit: Payer: BLUE CROSS/BLUE SHIELD | Admitting: Internal Medicine

## 2017-11-12 ENCOUNTER — Encounter: Payer: Self-pay | Admitting: Internal Medicine

## 2017-11-12 ENCOUNTER — Other Ambulatory Visit (INDEPENDENT_AMBULATORY_CARE_PROVIDER_SITE_OTHER): Payer: BLUE CROSS/BLUE SHIELD

## 2017-11-12 VITALS — BP 130/90 | HR 90 | Temp 98.0°F | Ht 70.0 in | Wt 399.6 lb

## 2017-11-12 DIAGNOSIS — N4889 Other specified disorders of penis: Secondary | ICD-10-CM

## 2017-11-12 DIAGNOSIS — E559 Vitamin D deficiency, unspecified: Secondary | ICD-10-CM

## 2017-11-12 DIAGNOSIS — Z430 Encounter for attention to tracheostomy: Secondary | ICD-10-CM | POA: Insufficient documentation

## 2017-11-12 DIAGNOSIS — G4733 Obstructive sleep apnea (adult) (pediatric): Secondary | ICD-10-CM | POA: Insufficient documentation

## 2017-11-12 DIAGNOSIS — E118 Type 2 diabetes mellitus with unspecified complications: Secondary | ICD-10-CM

## 2017-11-12 DIAGNOSIS — Z930 Tracheostomy status: Secondary | ICD-10-CM | POA: Diagnosis not present

## 2017-11-12 LAB — COMPREHENSIVE METABOLIC PANEL
ALT: 34 U/L (ref 0–53)
AST: 33 U/L (ref 0–37)
Albumin: 3.8 g/dL (ref 3.5–5.2)
Alkaline Phosphatase: 155 U/L — ABNORMAL HIGH (ref 39–117)
BILIRUBIN TOTAL: 0.6 mg/dL (ref 0.2–1.2)
BUN: 18 mg/dL (ref 6–23)
CHLORIDE: 90 meq/L — AB (ref 96–112)
CO2: 33 meq/L — AB (ref 19–32)
Calcium: 9.1 mg/dL (ref 8.4–10.5)
Creatinine, Ser: 0.96 mg/dL (ref 0.40–1.50)
GFR: 114.33 mL/min (ref >60.00)
GLUCOSE: 433 mg/dL — AB (ref 70–99)
Potassium: 3.2 mEq/L — ABNORMAL LOW (ref 3.5–5.1)
Sodium: 133 mEq/L — ABNORMAL LOW (ref 135–145)
Total Protein: 7.7 g/dL (ref 6.0–8.3)

## 2017-11-12 LAB — CBC
HCT: 38.7 % — ABNORMAL LOW (ref 39.0–52.0)
HEMOGLOBIN: 13.1 g/dL (ref 13.0–17.0)
MCHC: 33.8 g/dL (ref 30.0–36.0)
MCV: 89.1 fl (ref 78.0–100.0)
Platelets: 214 10*3/uL (ref 150.0–400.0)
RBC: 4.34 Mil/uL (ref 4.22–5.81)
RDW: 13.8 % (ref 11.5–15.5)
WBC: 8.7 10*3/uL (ref 4.0–10.5)

## 2017-11-12 LAB — LIPID PANEL
CHOL/HDL RATIO: 3
Cholesterol: 116 mg/dL (ref 0–200)
HDL: 38.2 mg/dL — AB (ref >39.00)
LDL CALC: 54 mg/dL (ref 0–99)
NONHDL: 77.73
Triglycerides: 119 mg/dL (ref 0.0–149.0)
VLDL: 23.8 mg/dL (ref 0.0–40.0)

## 2017-11-12 LAB — VITAMIN D 25 HYDROXY (VIT D DEFICIENCY, FRACTURES): VITD: 12.96 ng/mL — AB (ref 30.00–100.00)

## 2017-11-12 LAB — HEMOGLOBIN A1C: Hgb A1c MFr Bld: 12.8 % — ABNORMAL HIGH (ref 4.6–6.5)

## 2017-11-12 NOTE — Assessment & Plan Note (Signed)
Checking for stds with gc/chlamydia and HIV. Could be yeast infection given recent antibiotics and poor sugar control but story is atypical.

## 2017-11-12 NOTE — Assessment & Plan Note (Signed)
Completed replacement and needs vitamin D level recheck.

## 2017-11-12 NOTE — Progress Notes (Signed)
Tracheostomy Procedure Note  Derrick HansenReginald A [name] [id number] March 12, 1981  Pre Procedure Tracheostomy Information  Trach Brand: Bivona Size: 6.0 Style: Uncuffed Secured by: Velcro   Procedure: trach change    Post Procedure Tracheostomy Information  Trach Brand: Bivona Size: 6.0 Style: Uncuffed Secured by: Velcro   Post Procedure Evaluation:  ETCO2 positive color change from yellow to purple : Yes.   Vital signs:pulse 84, respirations 24 and pulse oximetry 99 % Patients current condition: stable Complications: No apparent complications Trach site exam: clean, dry Wound care done: dry, sterile, clean and 4 x 4 gauze Patient did tolerate procedure well.   Education: none  Prescription needs: none    Additional needs: none

## 2017-11-12 NOTE — Assessment & Plan Note (Signed)
Needs weight loss, weight has been labile. He is working on dietary changes and down about 5 pounds in the last month. Complicated by severe OSA (requiring trach placement) and diabetes, hypertension, hyperlipidemia. This is significantly worsening his QOL and causing life limitations.

## 2017-11-12 NOTE — Patient Instructions (Addendum)
We will check the labs today and the urine.   We will call you back and will need to adjust your medicines for the sugars.    Diabetes Mellitus and Nutrition When you have diabetes (diabetes mellitus), it is very important to have healthy eating habits because your blood sugar (glucose) levels are greatly affected by what you eat and drink. Eating healthy foods in the appropriate amounts, at about the same times every day, can help you:  Control your blood glucose.  Lower your risk of heart disease.  Improve your blood pressure.  Reach or maintain a healthy weight.  Every person with diabetes is different, and each person has different needs for a meal plan. Your health care provider may recommend that you work with a diet and nutrition specialist (dietitian) to make a meal plan that is best for you. Your meal plan may vary depending on factors such as:  The calories you need.  The medicines you take.  Your weight.  Your blood glucose, blood pressure, and cholesterol levels.  Your activity level.  Other health conditions you have, such as heart or kidney disease.  How do carbohydrates affect me? Carbohydrates affect your blood glucose level more than any other type of food. Eating carbohydrates naturally increases the amount of glucose in your blood. Carbohydrate counting is a method for keeping track of how many carbohydrates you eat. Counting carbohydrates is important to keep your blood glucose at a healthy level, especially if you use insulin or take certain oral diabetes medicines. It is important to know how many carbohydrates you can safely have in each meal. This is different for every person. Your dietitian can help you calculate how many carbohydrates you should have at each meal and for snack. Foods that contain carbohydrates include:  Bread, cereal, rice, pasta, and crackers.  Potatoes and corn.  Peas, beans, and lentils.  Milk and yogurt.  Fruit and  juice.  Desserts, such as cakes, cookies, ice cream, and candy.  How does alcohol affect me? Alcohol can cause a sudden decrease in blood glucose (hypoglycemia), especially if you use insulin or take certain oral diabetes medicines. Hypoglycemia can be a life-threatening condition. Symptoms of hypoglycemia (sleepiness, dizziness, and confusion) are similar to symptoms of having too much alcohol. If your health care provider says that alcohol is safe for you, follow these guidelines:  Limit alcohol intake to no more than 1 drink per day for nonpregnant women and 2 drinks per day for men. One drink equals 12 oz of beer, 5 oz of wine, or 1 oz of hard liquor.  Do not drink on an empty stomach.  Keep yourself hydrated with water, diet soda, or unsweetened iced tea.  Keep in mind that regular soda, juice, and other mixers may contain a lot of sugar and must be counted as carbohydrates.  What are tips for following this plan? Reading food labels  Start by checking the serving size on the label. The amount of calories, carbohydrates, fats, and other nutrients listed on the label are based on one serving of the food. Many foods contain more than one serving per package.  Check the total grams (g) of carbohydrates in one serving. You can calculate the number of servings of carbohydrates in one serving by dividing the total carbohydrates by 15. For example, if a food has 30 g of total carbohydrates, it would be equal to 2 servings of carbohydrates.  Check the number of grams (g) of saturated and trans  fats in one serving. Choose foods that have low or no amount of these fats.  Check the number of milligrams (mg) of sodium in one serving. Most people should limit total sodium intake to less than 2,300 mg per day.  Always check the nutrition information of foods labeled as "low-fat" or "nonfat". These foods may be higher in added sugar or refined carbohydrates and should be avoided.  Talk to your  dietitian to identify your daily goals for nutrients listed on the label. Shopping  Avoid buying canned, premade, or processed foods. These foods tend to be high in fat, sodium, and added sugar.  Shop around the outside edge of the grocery store. This includes fresh fruits and vegetables, bulk grains, fresh meats, and fresh dairy. Cooking  Use low-heat cooking methods, such as baking, instead of high-heat cooking methods like deep frying.  Cook using healthy oils, such as olive, canola, or sunflower oil.  Avoid cooking with butter, cream, or high-fat meats. Meal planning  Eat meals and snacks regularly, preferably at the same times every day. Avoid going long periods of time without eating.  Eat foods high in fiber, such as fresh fruits, vegetables, beans, and whole grains. Talk to your dietitian about how many servings of carbohydrates you can eat at each meal.  Eat 4-6 ounces of lean protein each day, such as lean meat, chicken, fish, eggs, or tofu. 1 ounce is equal to 1 ounce of meat, chicken, or fish, 1 egg, or 1/4 cup of tofu.  Eat some foods each day that contain healthy fats, such as avocado, nuts, seeds, and fish. Lifestyle   Check your blood glucose regularly.  Exercise at least 30 minutes 5 or more days each week, or as told by your health care provider.  Take medicines as told by your health care provider.  Do not use any products that contain nicotine or tobacco, such as cigarettes and e-cigarettes. If you need help quitting, ask your health care provider.  Work with a Social worker or diabetes educator to identify strategies to manage stress and any emotional and social challenges. What are some questions to ask my health care provider?  Do I need to meet with a diabetes educator?  Do I need to meet with a dietitian?  What number can I call if I have questions?  When are the best times to check my blood glucose? Where to find more information:  American Diabetes  Association: diabetes.org/food-and-fitness/food  Academy of Nutrition and Dietetics: PokerClues.dk  [employer] of Diabetes and Digestive and Kidney Diseases (NIH): ContactWire.be Summary  A healthy meal plan will help you control your blood glucose and maintain a healthy lifestyle.  Working with a diet and nutrition specialist (dietitian) can help you make a meal plan that is best for you.  Keep in mind that carbohydrates and alcohol have immediate effects on your blood glucose levels. It is important to count carbohydrates and to use alcohol carefully. This information is not intended to replace advice given to you by your health care provider. Make sure you discuss any questions you have with your health care provider. Document Released: 04/13/2005 Document Revised: 08/21/2016 Document Reviewed: 08/21/2016 Elsevier Interactive Patient Education  [employer].

## 2017-11-12 NOTE — Progress Notes (Signed)
   Subjective:    Patient ID: Derrick [name], male    DOB: 05-14-1981, 37 y.o.   MRN: [id number]  HPI The patient is a 37 YO man coming in for several concerns including weight loss (seeing [facility] and they said he missed nutrition visit, wants to get weight loss, has decreased carbs in diet, not doing more activity, some portion control, no specific diet, no weight watchers etc, complicated by htn, diabetes, osa), diabetes (not taking any medications for diabetes right now, not taking metformin due to causing diarrhea, not taking glipizide because he did not know what it was for, did not ever get the injections because he didn't do well with them, does have neuropathy which is mildly worse, taking gabapentin 600 mg QID and wants to do if we can increase) and possible yeast infection (pain around his penis foreskin, some brown discharge when he moves back, denies new partners, denies rash or ulcer on penis or scrotum, some itching/burning). Still taking keflex for the abscess on forehead although this is significantly improving.   Review of Systems  Constitutional: Negative.   HENT: Negative.   Eyes: Negative.   Respiratory: Negative for cough, chest tightness and shortness of breath.   Cardiovascular: Negative for chest pain, palpitations and leg swelling.  Gastrointestinal: Negative for abdominal distention, abdominal pain, constipation, diarrhea, nausea and vomiting.  Genitourinary: Positive for discharge and penile pain. Negative for flank pain, frequency, genital sores, penile swelling, scrotal swelling and testicular pain.  Musculoskeletal: Negative.   Skin: Positive for wound.  Neurological: Positive for numbness. Negative for dizziness, weakness and headaches.  Psychiatric/Behavioral: Negative.       Objective:   Physical Exam  Constitutional: He is oriented to person, place, and time. He appears well-developed and well-nourished.  obese  HENT:  Head: Normocephalic  and atraumatic.  Eyes: EOM are normal.  Neck: Normal range of motion.  Cardiovascular: Normal rate and regular rhythm.  Pulmonary/Chest: Effort normal and breath sounds normal. No respiratory distress. He has no wheezes. He has no rales.  Trach in place  Abdominal: Soft. Bowel sounds are normal. He exhibits no distension. There is no tenderness. There is no rebound.  Musculoskeletal: He exhibits no edema.  Neurological: He is alert and oriented to person, place, and time. Coordination normal.  Skin: Skin is warm and dry. Rash noted.  Wound on right forehead and acne on the face   Vitals:   11/12/17 0939  BP: 130/90  Pulse: 90  Temp: 98 F (36.7 C)  TempSrc: Oral  SpO2: 93%  Weight: (!) 399 lb 9.6 oz (181.3 kg)  Height: 5\' 10"  (1.778 m)      Assessment & Plan:

## 2017-11-12 NOTE — Assessment & Plan Note (Signed)
Checking HgA1c although suspect severe exacerbation as last was 12.7 on metformin and glipizide and now he is not taking anything for sugars. Complicated by severe neuropathy which is worsening lately. Taking gabapentin 600 mg QID for pain. Talked to him about the importance of good control and the severe consequences for ongoing poor control. Adjust as needed.

## 2017-11-13 LAB — HIV ANTIBODY (ROUTINE TESTING W REFLEX): HIV: NONREACTIVE

## 2017-11-13 NOTE — Progress Notes (Addendum)
[facility]   Reason for visit: Tracheostomy change HPI: 37 year old male patient whom I follow for tracheostomy management in the setting of severe obstructive sleep apnea.  Presents today for routine tracheostomy change.  Has been in usual state of health with the exception of a recent cellulitis from an infected boil on his right forehead.  This is since resolved with antibiotics  ROS General: No fever or chills HEENT no sore throat, tracheal drainage, no odor, no pain.  No change in phonation or voice quality. Pulmonary: No shortness of breath, chest pain, wheeze, cough Cardiac: No chest pain, lower extremity swelling, palpitations or orthopnea.  GI: No changes GU: No changes neuro: No changes musculoskeletal: No changes endocrine: No changes  Vital signs: Pulse oximetry 99% on room air heart rate 84 respirations 24  Exam: General: 37 year old male patient.  He ambulated into trach clinic.  No acute distress, tolerating activity well. HEENT: #6 Bivona tracheostomy cuffless.  This is unremarkable currently.  Excellent phonation with Passy-Muir valve.  Stoma site unremarkable. 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  Has lost approximately 50 pounds since we have met.  Still working towards weight reduction surgery.  Plan Continue every 5-6-week tracheostomy change Keep tracheostomy in place until after weight loss surgery.  At that point we can set him up for titration study and CPAP with goal to decannulate    Visit time: 15 minutes minutes.   Erick Colace ACNP-BC [facility]

## 2017-11-14 ENCOUNTER — Other Ambulatory Visit: Payer: Self-pay | Admitting: Internal Medicine

## 2017-11-14 LAB — GC/CHLAMYDIA PROBE AMP
CHLAMYDIA, DNA PROBE: NEGATIVE
NEISSERIA GONORRHOEAE BY PCR: NEGATIVE

## 2017-11-14 MED ORDER — FLUCONAZOLE 150 MG PO TABS: 150.0000 mg | ORAL_TABLET | ORAL | 0 refills | Status: DC

## 2017-11-15 ENCOUNTER — Ambulatory Visit: Payer: BLUE CROSS/BLUE SHIELD | Admitting: Internal Medicine

## 2017-11-15 DIAGNOSIS — Z2089 Contact with and (suspected) exposure to other communicable diseases: Secondary | ICD-10-CM

## 2017-11-25 ENCOUNTER — Other Ambulatory Visit: Payer: Self-pay | Admitting: Internal Medicine

## 2017-11-25 DIAGNOSIS — E118 Type 2 diabetes mellitus with unspecified complications: Secondary | ICD-10-CM

## 2017-11-26 ENCOUNTER — Telehealth: Payer: Self-pay | Admitting: Internal Medicine

## 2017-11-26 NOTE — Telephone Encounter (Signed)
Refills was denied by MD assistant forwarding msg to [facility] to f/u w/pharnacist../lmb

## 2017-11-26 NOTE — Telephone Encounter (Signed)
Refill request for Gabapentin (Neurontin)  cap. Pt states he is out of this medication. [facility] pharmacy is calling to see why medication was denied and would like a call back.  Last filled on 06/20/17 #240 with 5 refills  LOV: 11/12/17 PCP: Dr. Westley Hummer    [phone]

## 2017-11-26 NOTE — Telephone Encounter (Signed)
Called pharmacy to let them know that patient should not be due till 12/18/2017 for medication if patient is taking correctly. Pharmacist stated that is what they have to but shows patient was filling 4-5 days early. I informed pharmacist that even if filling early he is not due till the 21st of next month because if taking medication as prescribed he should not be out. Pharmacist agreed stated she will call patient and let them know and if he has any concerns or questions to call the office.

## 2017-11-26 NOTE — Telephone Encounter (Signed)
Pharmacy called to see why the medication was denied, call pharmacy

## 2017-11-26 NOTE — Telephone Encounter (Unsigned)
Copied from CRM [id number]. Topic: Quick Communication - Rx Refill/Question >> Nov 26, 2017 10:11 AM Raquel Sarna wrote: gabapentin (NEURONTIN) 300 MG capsule  Pt is out needing this refilled asap - out  [facility] [address] [place], [place] - [address] [place] [address] Phone: [phone] Fax: [phone]

## 2017-11-27 ENCOUNTER — Encounter: Payer: Self-pay | Admitting: Internal Medicine

## 2017-11-27 ENCOUNTER — Ambulatory Visit: Payer: BLUE CROSS/BLUE SHIELD | Admitting: Internal Medicine

## 2017-11-27 DIAGNOSIS — E118 Type 2 diabetes mellitus with unspecified complications: Secondary | ICD-10-CM

## 2017-11-27 MED ORDER — GABAPENTIN 300 MG PO CAPS: 600.0000 mg | ORAL_CAPSULE | Freq: Four times a day (QID) | ORAL | 5 refills | Status: DC

## 2017-11-27 MED ORDER — GLIPIZIDE ER 10 MG PO TB24: 10.0000 mg | ORAL_TABLET | Freq: Every day | ORAL | 3 refills | Status: DC

## 2017-11-27 MED ORDER — SITAGLIPTIN PHOSPHATE 100 MG PO TABS: 100.0000 mg | ORAL_TABLET | Freq: Every day | ORAL | 0 refills | Status: DC

## 2017-11-27 MED ORDER — PIOGLITAZONE HCL 30 MG PO TABS: 30.0000 mg | ORAL_TABLET | Freq: Every day | ORAL | 0 refills | Status: DC

## 2017-11-27 NOTE — Patient Instructions (Addendum)
We have sent in the glipizide refill to take 1 pill daily.   We have also sent in januvia to take 1 pill daily.   We have sent in actos to take 1 pill daily.   We will get you in with the diabetes specialist.   It is okay to take the gabapentin 4 times per day but only 8 pills maximum daily.   Check on these weight loss medications to see if they are covered: contrave, qsymia, belviq.

## 2017-11-27 NOTE — Assessment & Plan Note (Addendum)
Uncontrolled and severe exacerbation. He has not kept up with this problem. 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 Refill gabapentin and advised that we cannot fill early again. Add actos, januvia, continue glipizide. Needs recheck in 3 months. He wants to see endo so referral placed. We talked about how his options are limited given that he has had side effects with so many medication classes. Given information about contrave, qsymia and belviq for weight loss to help as well and he will let us know about coverage. Suspect some weight loss from poorly controlled sugars.

## 2017-11-27 NOTE — Progress Notes (Signed)
   Subjective:    Patient ID: Derrick [name], male    DOB: March 14, 1981, 37 y.o.   MRN: [id number]  HPI The patient is a 37 YO man coming in for follow up of poorly controlled sugars. His last HgA1c was 12.8. He had stopped taking all the medications for sugars and was recently advised to restart glipizide which he is taking now. Denies low sugars. He has taken invokana which caused yeast infection and he stopped. He has had diarrhea on metformin and does not want to take anymore. He has also been on injectable glp-1 which caused him stomach pains so he stopped. He is working on weight loss and weight is falling recently. He is taking gabapentin 4 times a day for the pain and it helps.   Review of Systems  Constitutional: Negative.   Respiratory: Negative for cough, chest tightness and shortness of breath.   Cardiovascular: Negative for chest pain, palpitations and leg swelling.  Gastrointestinal: Negative for abdominal distention, abdominal pain, constipation, diarrhea, nausea and vomiting.  Musculoskeletal: Negative.   Skin: Negative.   Neurological: Positive for numbness.  Psychiatric/Behavioral: Negative.       Objective:   Physical Exam  Constitutional: He is oriented to person, place, and time. He appears well-developed and well-nourished.  overweight  HENT:  Head: Normocephalic and atraumatic.  Eyes: EOM are normal.  Neck: Normal range of motion.  Cardiovascular: Normal rate and regular rhythm.  Pulmonary/Chest: Effort normal and breath sounds normal. No respiratory distress. He has no wheezes. He has no rales.  Abdominal: Soft. He exhibits no distension. There is no tenderness. There is no rebound.  Musculoskeletal: He exhibits no edema.  Neurological: He is alert and oriented to person, place, and time. Coordination normal.  Skin: Skin is warm and dry.   Vitals:   11/27/17 1302 11/27/17 1418  BP: (!) 138/98 120/90  Pulse: 96   Temp: 98.5 F (36.9 C)   TempSrc: Oral    SpO2: 91%   Weight: (!) 398 lb (180.5 kg)   Height:  (1.778 m)       Assessment & Plan:

## 2017-11-28 ENCOUNTER — Ambulatory Visit: Payer: BLUE CROSS/BLUE SHIELD | Admitting: Internal Medicine

## 2017-12-06 ENCOUNTER — Ambulatory Visit (HOSPITAL_COMMUNITY): Payer: BLUE CROSS/BLUE SHIELD

## 2017-12-19 ENCOUNTER — Ambulatory Visit (HOSPITAL_COMMUNITY): Payer: BLUE CROSS/BLUE SHIELD

## 2017-12-27 ENCOUNTER — Inpatient Hospital Stay (HOSPITAL_COMMUNITY): Admission: RE | Admit: 2017-12-27 | Payer: BLUE CROSS/BLUE SHIELD | Source: Ambulatory Visit

## 2017-12-28 ENCOUNTER — Ambulatory Visit (HOSPITAL_COMMUNITY)
Admission: RE | Admit: 2017-12-28 | Discharge: 2017-12-28 | Disposition: A | Discharge status: Home / Self Care | Payer: BLUE CROSS/BLUE SHIELD | Source: Ambulatory Visit | Attending: Internal Medicine | Admitting: Internal Medicine

## 2017-12-28 DIAGNOSIS — Z930 Tracheostomy status: Secondary | ICD-10-CM

## 2017-12-28 NOTE — Progress Notes (Signed)
[facility]   Reason for visit: Routine trach change  HPI: Well known to me. Followed for trach management in setting of severe OSA. Continues to work towards weight loss w/ goal for weight reduction surgery and eventually PSG and decannulation   ROS Gen: no fever, chills, no sick exposures. HENT no tracheal drainage or dc, no HA, sore throat, sinus drainage. Pulm: no SOB. No cough no CP. Card no CP no palp. No new swelling. Neuro no HA, no focal weakness. No seizures. Abd: no N/V/D. Gu: no acute issues.   Vital signs: Reviewed   Exam: General ambulatory. No distress. Looks well.  HENT tracheostomy stoma site unremarkable. No drainage or dc. Able to phonate w/ trach capped  pulm CTA no accessory use  Card RRR  abd no pain + bowel sounds.  Neuro: intact   Trach change/procedure Tracheostomy changed w/out difficulty.    Impression/dx Tracheostomy dependent Severe OSA  MO (actively participating in weight loss)   Discussion [name] continues to make progress in his weight loss goals. The plan is to eventually undergo weight reduction surgery. It has been challenging to get all of the consults and paper work completed. He thinks we will probably be looking at a few more months before the surgery can happen   Plan ROV 5-6 weeks     Visit time:35  minutes.   Simonne MartinetPeter E Attila Mccarthy ACNP-BC [facility]

## 2017-12-28 NOTE — Progress Notes (Signed)
Tracheostomy Procedure Note  Derrick [name] [id number] [birth year]  Pre Procedure Tracheostomy Information  Trach Brand: bivona Size: 6.0 Style: Uncuffed Secured by: Velcro   Procedure: trach change    Post Procedure Tracheostomy Information  Trach Brand: Bivona Size: 6.0 Style: Uncuffed Secured by: Velcro   Post Procedure Evaluation:  ETCO2 positive color change from yellow to purple : Yes.   Vital signs:blood pressure 175/94, pulse 91, respirations 15 and pulse oximetry 94 % Patients current condition: stable Complications: No apparent complications Trach site exam: clean, dry Wound care done: dry and 4 x 4 gauze Patient did tolerate procedure well.   Education: none  Prescription needs: none    Additional needs: 5 week follow up

## 2018-01-12 IMAGING — DX DG CHEST 2V
2 series · 2 of 2 positions shown · non-contrast
Comparison: Chest x-ray dated May 17, 2016

CLINICAL DATA: Two weeks of wheezing and cough. Two days of
dyspnea. History of CHF, previous episodes of pneumonia, current
smoker.

EXAM:
CHEST  2 VIEW

[chest pa]
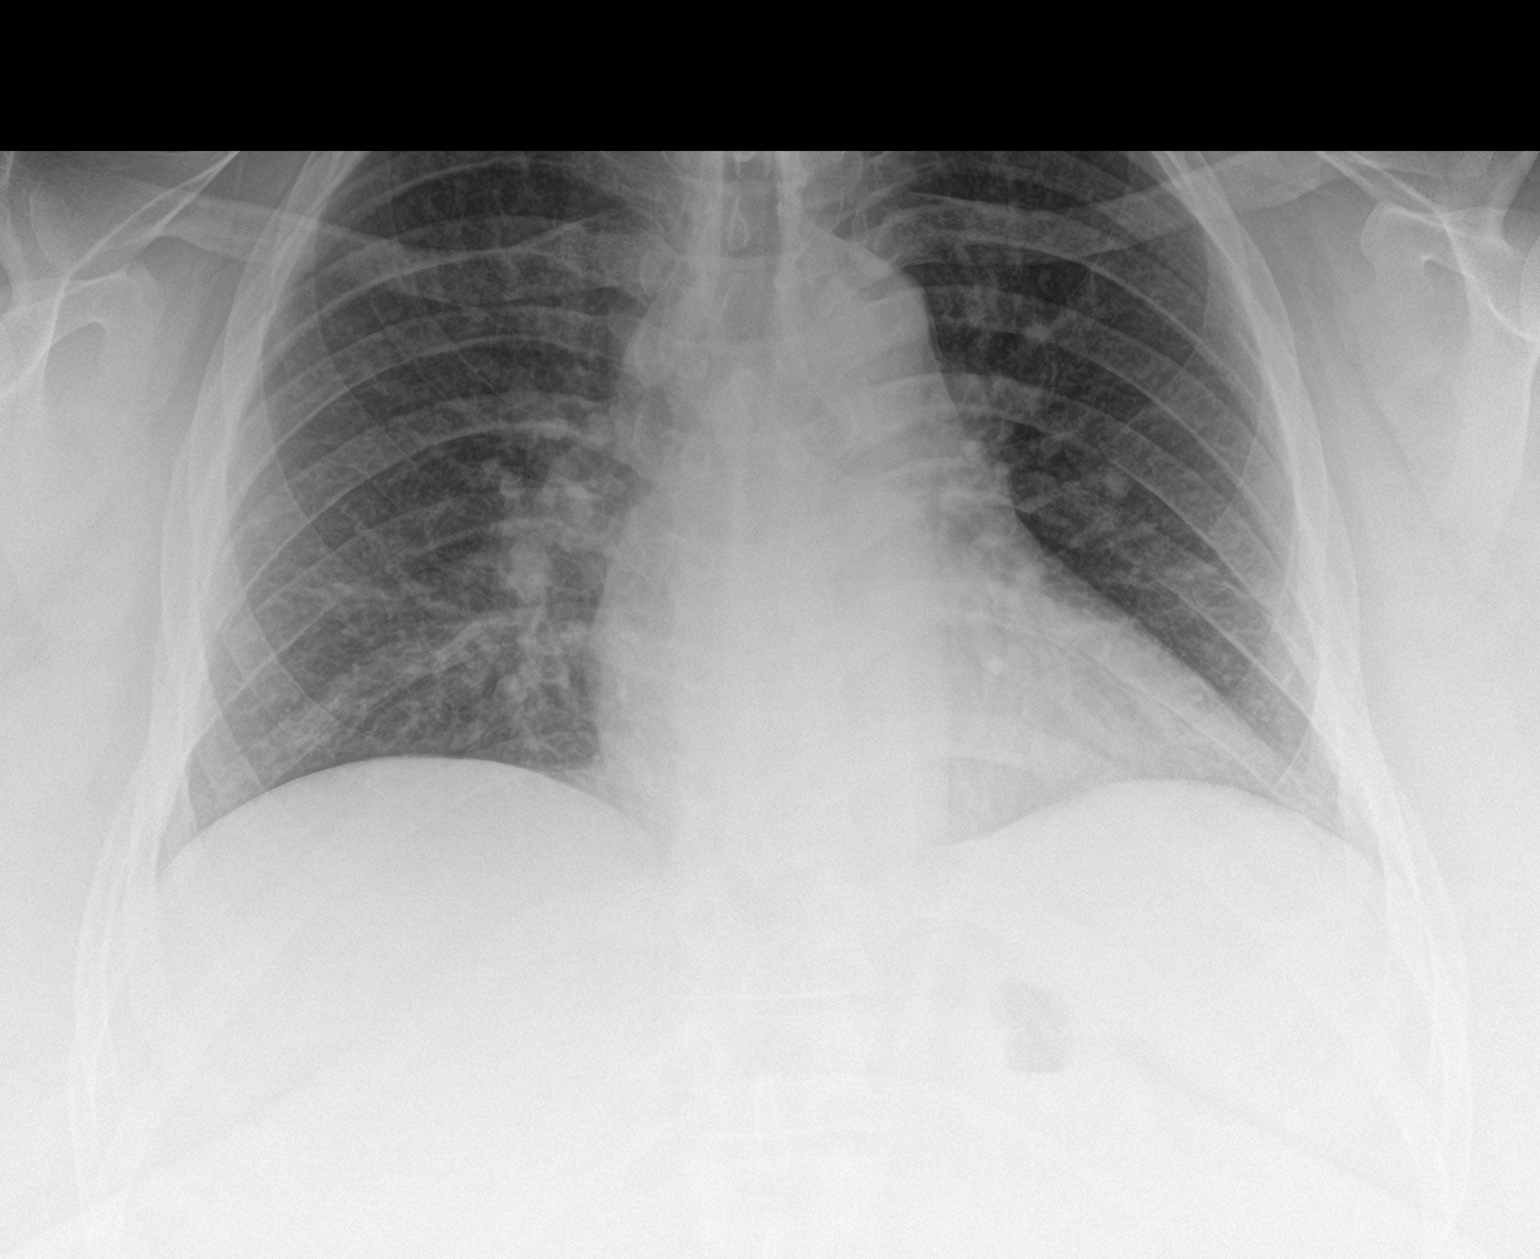

[chest lat]
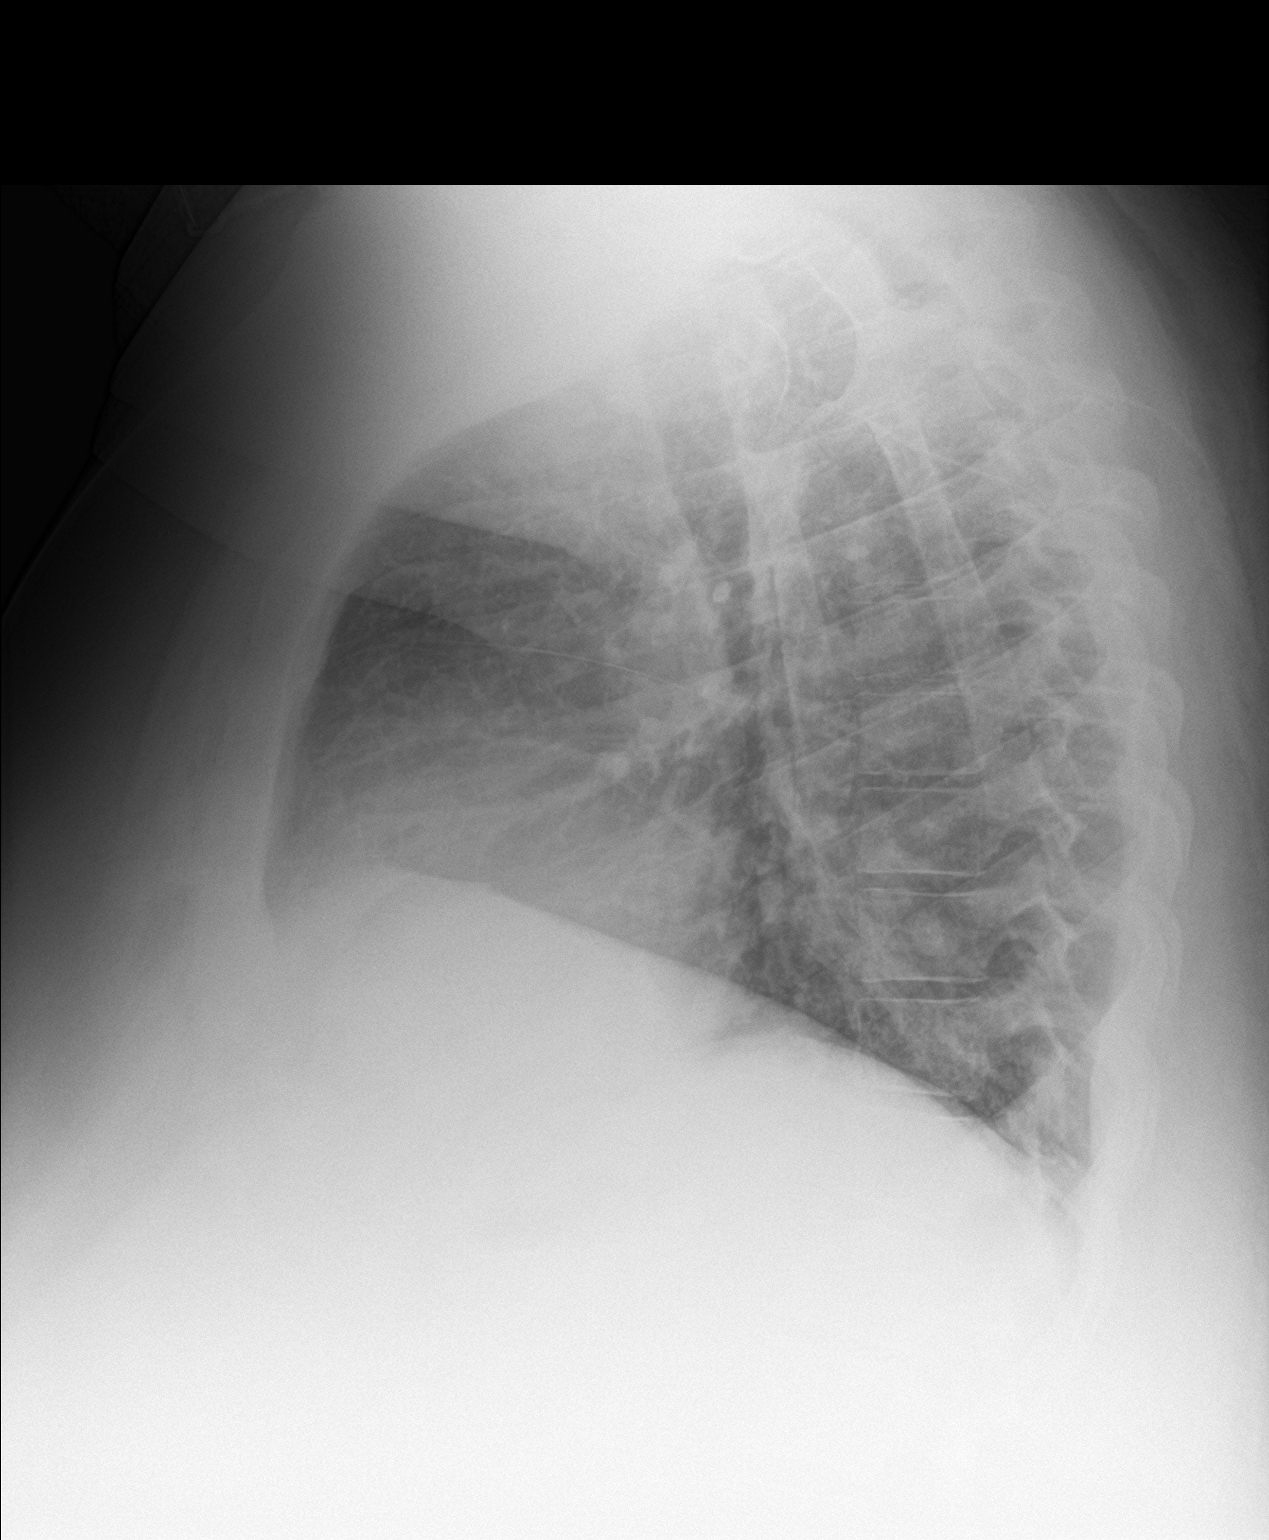

[2 of 2 positions shown; findings below may reference images not displayed]

FINDINGS: The lungs are adequately inflated. There is no focal infiltrate. The
interstitial markings are coarse but stable. Subtle nodularity
projects over the lower thoracic spine on the lateral view which
likely lies in the infrahilar region on the right. The heart is
top-normal in size. The pulmonary vascularity is normal. A
tracheostomy appliance is in place. There is no pleural effusion.
IMPRESSION: No acute pneumonia nor pulmonary edema. Subtle nodularity is present
likely in the right lower lobe. Subtle nodular densities elsewhere
may reflect engorged pulmonary vessels but true nodules could be
present as well. Given the patient's x-ray findings and clinical
symptoms, chest CT scanning is recommended.

## 2018-01-13 ENCOUNTER — Other Ambulatory Visit: Payer: Self-pay | Admitting: Internal Medicine

## 2018-01-29 ENCOUNTER — Other Ambulatory Visit: Payer: Self-pay | Admitting: Internal Medicine

## 2018-02-26 ENCOUNTER — Ambulatory Visit: Payer: BLUE CROSS/BLUE SHIELD | Admitting: Internal Medicine

## 2018-02-27 ENCOUNTER — Ambulatory Visit (HOSPITAL_COMMUNITY): Payer: BLUE CROSS/BLUE SHIELD

## 2018-02-27 ENCOUNTER — Ambulatory Visit: Payer: BLUE CROSS/BLUE SHIELD | Admitting: Internal Medicine

## 2018-02-27 DIAGNOSIS — Z0289 Encounter for other administrative examinations: Secondary | ICD-10-CM

## 2018-02-28 NOTE — Progress Notes (Deleted)
No show

## 2018-03-01 ENCOUNTER — Ambulatory Visit: Payer: BLUE CROSS/BLUE SHIELD | Admitting: Endocrinology

## 2018-03-01 DIAGNOSIS — Z0289 Encounter for other administrative examinations: Secondary | ICD-10-CM

## 2018-03-28 ENCOUNTER — Ambulatory Visit (HOSPITAL_COMMUNITY)
Admission: RE | Admit: 2018-03-28 | Discharge: 2018-03-28 | Disposition: A | Discharge status: Home / Self Care | Payer: BLUE CROSS/BLUE SHIELD | Source: Ambulatory Visit | Attending: Acute Care | Admitting: Acute Care

## 2018-03-28 DIAGNOSIS — G4733 Obstructive sleep apnea (adult) (pediatric): Secondary | ICD-10-CM

## 2018-03-28 DIAGNOSIS — Z430 Encounter for attention to tracheostomy: Secondary | ICD-10-CM | POA: Insufficient documentation

## 2018-03-28 NOTE — Progress Notes (Signed)
Tracheostomy Procedure Note  Derrick HansenReginald A [name] [id number] 09/13/1980  Pre Procedure Tracheostomy Information  Trach Brand: Bivona Size: 6 Style: Uncuffed Secured by: Velcro   Procedure: trach change    Post Procedure Tracheostomy Information  Trach Brand: Bivona Size: 6 Style: Uncuffed Secured by: Velcro   Post Procedure Evaluation:  ETCO2 positive color change from yellow to purple : Yes.   Vital signs:blood pressure 172/92, pulse 85, respirations 18 and pulse oximetry 97% on RA with PMV Patients current condition: stable Complications: No apparent complications Trach site exam: clean, dry Wound care done: 4 x 4 gauze Patient did tolerate procedure well.   Education: None  Given 3 extra trach tube holders See back in clinic in 4 weeks

## 2018-03-28 NOTE — Progress Notes (Signed)
[facility]   Reason for visit:  Routine trach care & change   HPI:  37 year old male well known to me. Has h/o MO (has lost over 100 lbs in the last 3 years that I have followed him), severe OSA and trach dependence following a prolonged critical illness 2/2 decompensated cor pulmonale, diastolic HF and volume overload back in 2016. He returns today for routine trach care.  ROS  General: no new fever, chills or weakness. HENT No HA, sore throat, trach change, odor or dc. Pulm: no new SOB, no CP, no wheeze. No cough Card: no CP. No palps. No light-headedness. Abd: cont purposeful weight loss. No N/V/D. GU: no issues. MS: no issues. Neuro: no new issue   Vital signs:  HR 85 BP 172/92 rr 18 sats 97% room air  Exam:  General pleasant 37 year old aam. Ambulatory and in no acute distress. HENT: Normocephalic atraumatic.  He has a size 6 uncuffed Bivona tracheostomy in place.  Tracheostomy stoma is unremarkable.  He speaks with Passy-Muir valve without difficulty.  There is no drainage or discharge from the tracheostomy. Pulmonary: Clear to auscultation without accessory use Cardiac: Regular rate and rhythm Extremities: No edema brisk cap refill Abdomen: Soft nontender Neuro: Intact. Trach change/procedure:  The size 6 Bivona tracheostomy was removed without difficulty.  Tracheostomy stoma was evaluated and was unremarkable.  The patient was localized with 1% lidocaine jelly, then a size 6 Bivona tracheostomy was easily placed, position checked with end-tidal CO2, patient tolerated well.      Impression/dx  Tracheostomy dependence Morbid obesity Obstructive sleep apnea Discussion  Tiburcio BashReginald is continuing to improve from her overall general health standpoint.  He is lost over 100 pounds in the last 3 years and have down him, and continues to make progress in that respect.  He had been seriously considering gastric bypass surgery but has recently lost a friend as a  complication of that surgery and is having second thoughts.  He is asked me about referral for sleep study, and possible transition to CPAP, I think that this is probably a reasonable consideration given his sick significant weight loss.  I have urged him to continue to decide on gastric bypass surgery, as tracheostomy being present would certainly decrease his risk, however if he is decided he no longer wants to pursue this I think is reasonable to have a sleep study and evaluate him for possible decannulation down the road Plan  Continue routine tracheostomy care with plan to change every 4 to 6 weeks I will refer him to Dr. Craige CottaSood.  He will need an occlusive tracheostomy, and then perhaps we can set him up for a split study to evaluate his sleep apnea in potentially tolerance of CPAP. I would like to see him be successful with this for at least a week before I would agree to decannulation  Simonne MartinetPeter E Merline Perkin ACNP-BC [facility] Pulmonary/Critical Care Pager # [phone] OR # [phone] if no answer     Visit time: 36 minutes.

## 2018-04-03 ENCOUNTER — Ambulatory Visit: Payer: BLUE CROSS/BLUE SHIELD | Admitting: Internal Medicine

## 2018-04-04 ENCOUNTER — Ambulatory Visit: Payer: BLUE CROSS/BLUE SHIELD | Admitting: Internal Medicine

## 2018-04-09 ENCOUNTER — Encounter: Payer: Self-pay | Admitting: Internal Medicine

## 2018-04-09 ENCOUNTER — Ambulatory Visit: Payer: BLUE CROSS/BLUE SHIELD | Admitting: Internal Medicine

## 2018-04-09 ENCOUNTER — Other Ambulatory Visit (INDEPENDENT_AMBULATORY_CARE_PROVIDER_SITE_OTHER): Payer: BLUE CROSS/BLUE SHIELD

## 2018-04-09 VITALS — BP 134/82 | HR 83 | Temp 97.7°F | Ht 70.0 in | Wt 391.0 lb

## 2018-04-09 DIAGNOSIS — I10 Essential (primary) hypertension: Secondary | ICD-10-CM

## 2018-04-09 DIAGNOSIS — I5032 Chronic diastolic (congestive) heart failure: Secondary | ICD-10-CM | POA: Diagnosis not present

## 2018-04-09 DIAGNOSIS — L709 Acne, unspecified: Secondary | ICD-10-CM

## 2018-04-09 DIAGNOSIS — N528 Other male erectile dysfunction: Secondary | ICD-10-CM | POA: Diagnosis not present

## 2018-04-09 DIAGNOSIS — E118 Type 2 diabetes mellitus with unspecified complications: Secondary | ICD-10-CM | POA: Diagnosis not present

## 2018-04-09 LAB — POCT GLYCOSYLATED HEMOGLOBIN (HGB A1C): Hemoglobin A1C: 10.2 % — AB (ref 4.0–5.6)

## 2018-04-09 LAB — COMPREHENSIVE METABOLIC PANEL
ALT: 22 U/L (ref 0–53)
AST: 17 U/L (ref 0–37)
Albumin: 3.9 g/dL (ref 3.5–5.2)
Alkaline Phosphatase: 130 U/L — ABNORMAL HIGH (ref 39–117)
BUN: 11 mg/dL (ref 6–23)
CHLORIDE: 94 meq/L — AB (ref 96–112)
CO2: 36 meq/L — AB (ref 19–32)
CREATININE: 0.89 mg/dL (ref 0.40–1.50)
Calcium: 8.5 mg/dL (ref 8.4–10.5)
GFR: 124.48 mL/min (ref >60.00)
GLUCOSE: 279 mg/dL — AB (ref 70–99)
Potassium: 2.9 mEq/L — ABNORMAL LOW (ref 3.5–5.1)
SODIUM: 139 meq/L (ref 135–145)
Total Bilirubin: 0.7 mg/dL (ref 0.2–1.2)
Total Protein: 7.9 g/dL (ref 6.0–8.3)

## 2018-04-09 LAB — BRAIN NATRIURETIC PEPTIDE: PRO B NATRI PEPTIDE: 30 pg/mL (ref 0.0–100.0)

## 2018-04-09 MED ORDER — SITAGLIPTIN PHOSPHATE 100 MG PO TABS: 100.0000 mg | ORAL_TABLET | Freq: Every day | ORAL | 1 refills | Status: DC

## 2018-04-09 MED ORDER — PIOGLITAZONE HCL 30 MG PO TABS: 30.0000 mg | ORAL_TABLET | Freq: Every day | ORAL | 1 refills | Status: DC

## 2018-04-09 MED ORDER — MONTELUKAST SODIUM 10 MG PO TABS: 10.0000 mg | ORAL_TABLET | Freq: Every day | ORAL | 3 refills | Status: DC

## 2018-04-09 MED ORDER — CETIRIZINE HCL 10 MG PO TABS: 10.0000 mg | ORAL_TABLET | Freq: Every day | ORAL | 3 refills | Status: DC

## 2018-04-09 MED ORDER — TADALAFIL 20 MG PO TABS: 10.0000 mg | ORAL_TABLET | ORAL | 2 refills | Status: DC | PRN

## 2018-04-09 NOTE — Assessment & Plan Note (Addendum)
Did not get relief with viagra. Rx for cialis and reminded not to take nitroglycerin within 24 hours of this. Seek care for erection lasting longer than 4-6 hours. Due to diabetes.

## 2018-04-09 NOTE — Patient Instructions (Addendum)
Call [facility] dermatology to set up the visit. Their number is [phone].   We are checking the labs today and will call you back.  We need to see you back in 3 months. Start taking the [place] and actos and keep taking glipizide.   We have sent in cialis to try for the erections. Do not take this within 24 hours of taking nitroglycerin. Seek care for erection lasting longer than 6 hours.   We will get you back in with the nutritionist.

## 2018-04-09 NOTE — Assessment & Plan Note (Signed)
Complicated by ED and severe neuropathy for which he takes gabapentin. Taking glipizide alone for control and down 8 pounds with HgA1c 10.2 in the office today. Advised that he needs better control and refilled actos and [place] and he needs to take both. Visit needed in 3 months and reminded him of the importance of good control in preventing more problems with his health.

## 2018-04-09 NOTE — Assessment & Plan Note (Signed)
Given info for dermatology to make visit.

## 2018-04-09 NOTE — Assessment & Plan Note (Signed)
Checking CMP and BNP. Adjust as needed. He has not seen cardiology for some time.

## 2018-04-09 NOTE — Progress Notes (Signed)
   Subjective:    Patient ID: Derrick [name], male    DOB: 02-04-1981, 37 y.o.   MRN: [id number]  HPI The patient is a 37 YO man coming in for follow up of his poorly controlled diabetes but has several other concerns as well. His last HgA1c was 12.7 and he was asked to come in for a visit to address sugars. This was about 4-5 months ago. He has lost about 8 pounds since last visit not really trying. Diet is stable. No exercise currently. He stopped metformin on his own before last visit. We added [place] and actos at the last visit. He only was given 3 month supply and has not requested refills so it is unlikely that he is taking appropriately.   Other concerns including ED (tried viagra and this did not help much, wants to try something different, cannot have erection when desired), and acne (did not see dermatologist but needs their information to make a visit) as well as concerns about his heart failure (having some pressure in his chest and wonders about fluid buildup, is still taking lasix, not taking hctz or amlodipine for blood pressure for some time).   Review of Systems  Constitutional: Positive for appetite change. Negative for activity change, fatigue, fever and unexpected weight change.  HENT: Positive for postnasal drip and rhinorrhea.   Eyes: Negative.   Respiratory: Positive for cough and chest tightness. Negative for shortness of breath.   Cardiovascular: Negative for chest pain, palpitations and leg swelling.  Gastrointestinal: Positive for diarrhea. Negative for abdominal distention, abdominal pain, constipation, nausea and vomiting.  Musculoskeletal: Positive for myalgias.  Skin: Negative.        acne  Neurological: Positive for numbness.  Psychiatric/Behavioral: Negative.       Objective:   Physical Exam  Constitutional: He is oriented to person, place, and time. He appears well-developed and well-nourished.  overweight  HENT:  Head: Normocephalic and  atraumatic.  Eyes: EOM are normal.  Neck: Normal range of motion.  Cardiovascular: Normal rate and regular rhythm.  Pulmonary/Chest: Effort normal and breath sounds normal. No respiratory distress. He has no wheezes. He has no rales.  Trach declines exam  Abdominal: Soft. Bowel sounds are normal. He exhibits no distension. There is no tenderness. There is no rebound.  Musculoskeletal: He exhibits no edema.  Neurological: He is alert and oriented to person, place, and time. Coordination normal.  Numbness and tingling in feet and hands  Skin: Skin is warm and dry.  Acne on face and nose  Psychiatric: He has a normal mood and affect.   Vitals:   04/09/18 1050  BP: 134/82  Pulse: 83  Temp: 97.7 F (36.5 C)  TempSrc: Oral  SpO2: 98%  Weight: (!) 391 lb (177.4 kg)  Height: 5\' 10"  (1.778 m)   POC HgA1c 10.2    Assessment & Plan:

## 2018-04-09 NOTE — Assessment & Plan Note (Signed)
BP is at goal on lasix and metoprolol alone. He should be on ace-I or ARB but will address at next visit given that we are asking him to add 2 more medications today already.

## 2018-04-26 ENCOUNTER — Other Ambulatory Visit: Payer: Self-pay | Admitting: Internal Medicine

## 2018-04-29 ENCOUNTER — Other Ambulatory Visit: Payer: Self-pay | Admitting: Internal Medicine

## 2018-04-30 ENCOUNTER — Ambulatory Visit: Payer: BLUE CROSS/BLUE SHIELD | Admitting: Registered"

## 2018-05-01 ENCOUNTER — Ambulatory Visit (HOSPITAL_COMMUNITY): Payer: BLUE CROSS/BLUE SHIELD

## 2018-05-02 ENCOUNTER — Inpatient Hospital Stay (HOSPITAL_COMMUNITY): Admission: RE | Admit: 2018-05-02 | Payer: BLUE CROSS/BLUE SHIELD | Source: Ambulatory Visit

## 2018-05-16 ENCOUNTER — Encounter: Payer: Self-pay | Admitting: Registered"

## 2018-05-16 ENCOUNTER — Encounter: Payer: BLUE CROSS/BLUE SHIELD | Attending: Internal Medicine | Admitting: Registered"

## 2018-05-16 ENCOUNTER — Ambulatory Visit (HOSPITAL_COMMUNITY)
Admission: RE | Admit: 2018-05-16 | Discharge: 2018-05-16 | Disposition: A | Discharge status: Home / Self Care | Payer: BLUE CROSS/BLUE SHIELD | Source: Ambulatory Visit | Attending: Acute Care | Admitting: Acute Care

## 2018-05-16 DIAGNOSIS — E118 Type 2 diabetes mellitus with unspecified complications: Secondary | ICD-10-CM | POA: Insufficient documentation

## 2018-05-16 DIAGNOSIS — Z430 Encounter for attention to tracheostomy: Secondary | ICD-10-CM | POA: Insufficient documentation

## 2018-05-16 DIAGNOSIS — Z713 Dietary counseling and surveillance: Secondary | ICD-10-CM | POA: Diagnosis not present

## 2018-05-16 DIAGNOSIS — E119 Type 2 diabetes mellitus without complications: Secondary | ICD-10-CM

## 2018-05-16 NOTE — Progress Notes (Signed)
Diabetes Self-Management Education  Visit Type:  First/Initial  Appt. Start Time: 2:45  Appt. End Time: 3:57  05/20/2018  Derrick [name], identified by name and date of birth, is a 37 y.o. male with a diagnosis of Diabetes: Type 2.   ASSESSMENT  Pt expectations: wants to bring A1c down to less than 7  Pt states he eats [place], chicken, fish, fruits and vegetables. Pt states he loves sweet tea and lemonade. Pt states he does not drink soda often but ginger ale sometimes. Pt states he likes freeze pops or italian ice.   Pt states he has a 39 year old son, works as Optician, dispensing of music, and helps take care of his grandmother whom he lives with. Pt states he becomes busy with life and sometimes forget to eat.   Pt states he has been exercising more. Pt states he has tendonitis in left foot, limits physical activity participation. Pt reports neuropathy attacks in hands and sometimes feet. Pt states he ate breakfast today around 8-8:30am and has not had lunch yet due to time constraints and didn't want to be late for the appointment.   There were no vitals taken for this visit. There is no height or weight on file to calculate BMI.   Diabetes Self-Management Education - 05/16/18 1454      Health Coping   How would you rate your overall health?  Fair      Psychosocial Assessment   Patient Belief/Attitude about Diabetes  Motivated to manage diabetes    Self-care barriers  None    Self-management support  Family    Patient Concerns  Nutrition/Meal planning    Special Needs  None    Preferred Learning Style  No preference indicated    Learning Readiness  Not Ready      Complications   Last HgB A1C per patient/outside source  10.2 %    How often do you check your blood sugar?  1-2 times/day    Fasting Blood glucose range (mg/dL)  161-096   045-409   Postprandial Blood glucose range (mg/dL)  811-914   782-956   Number of hypoglycemic episodes per month  0    Number of  hyperglycemic episodes per week  0    Have you had a dilated eye exam in the past 12 months?  No    Have you had a dental exam in the past 12 months?  No    Are you checking your feet?  No      Dietary Intake   Breakfast  meal replacement shake + fruit or [place] sausage + eggs + green peppers + onions + cheese + toast with jelly    Snack (morning)  none    Lunch  sometimes skips; chicken salad + club crackers    Snack (afternoon)  sometimes celery carrots or chips    Dinner  chicken salad or baked wings + garlic mashed potatoes + green beans + corn    Snack (evening)  none    Beverage(s)  water, lemonade, sweet tea, apple juice (rarely)      Exercise   Exercise Type  Light (walking / raking leaves)   walking, crunches, taking stairs   How many days per week to you exercise?  5    How many minutes per day do you exercise?  30    Total minutes per week of exercise  150      Patient Education   Previous Diabetes Education  Yes (please  comment)    Disease state   Definition of diabetes, type 1 and 2, and the diagnosis of diabetes;Factors that contribute to the development of diabetes    Nutrition management   Role of diet in the treatment of diabetes and the relationship between the three main macronutrients and blood glucose level;Food label reading, portion sizes and measuring food.;Carbohydrate counting;Reviewed blood glucose goals for pre and post meals and how to evaluate the patients' food intake on their blood glucose level.;Effects of alcohol on blood glucose and safety factors with consumption of alcohol.;Meal options for control of blood glucose level and chronic complications.;Information on hints to eating out and maintain blood glucose control.    Physical activity and exercise   Role of exercise on diabetes management, blood pressure control and cardiac health.;Identified with patient nutritional and/or medication changes necessary with exercise.    Monitoring  Purpose and  frequency of SMBG.;Interpreting lab values - A1C, lipid, urine microalbumina.;Taught/discussed recording of test results and interpretation of SMBG.;Identified appropriate SMBG and/or A1C goals.;Daily foot exams;Yearly dilated eye exam    Acute complications  Taught treatment of hypoglycemia - the 15 rule.;Discussed and identified patients' treatment of hyperglycemia.    Chronic complications  Relationship between chronic complications and blood glucose control;Assessed and discussed foot care and prevention of foot problems;Identified and discussed with patient  current chronic complications;Retinopathy and reason for yearly dilated eye exams;Reviewed with patient heart disease, higher risk of, and prevention;Lipid levels, blood glucose control and heart disease;Dental care;Nephropathy, what it is, prevention of, the use of ACE, ARB's and early detection of through urine microalbumia.    Psychosocial adjustment  Role of stress on diabetes;Worked with patient to identify barriers to care and solutions    Personal strategies to promote health  Lifestyle issues that need to be addressed for better diabetes care      Individualized Goals (developed by patient)   Nutrition  Follow meal plan discussed;General guidelines for healthy choices and portions discussed    Physical Activity  Exercise 5-7 days per week;30 minutes per day    Medications  take my medication as prescribed    Monitoring   test my blood glucose as discussed;test blood glucose pre and post meals as discussed    Reducing Risk  examine blood glucose patterns;do foot checks daily;treat hypoglycemia with 15 grams of carbs if blood glucose less than 70mg /dL;increase portions of healthy fats      Post-Education Assessment   Patient understands the diabetes disease and treatment process.  Demonstrates understanding / competency    Patient understands incorporating nutritional management into lifestyle.  Demonstrates understanding / competency     Patient undertands incorporating physical activity into lifestyle.  Demonstrates understanding / competency    Patient understands using medications safely.  Demonstrates understanding / competency    Patient understands monitoring blood glucose, interpreting and using results  Demonstrates understanding / competency    Patient understands prevention, detection, and treatment of acute complications.  Demonstrates understanding / competency    Patient understands prevention, detection, and treatment of chronic complications.  Demonstrates understanding / competency    Patient understands how to develop strategies to address psychosocial issues.  Demonstrates understanding / competency    Patient understands how to develop strategies to promote health/change behavior.  Demonstrates understanding / competency      Outcomes   Program Status  Completed       Learning Objective:  Patient will have a greater understanding of diabetes self-management. Patient education plan is to attend  individual and/or group sessions per assessed needs and concerns.   Plan:   Patient Instructions  Goals:  Follow Diabetes Meal Plan as instructed  Eat 3 meals and 2 snacks, every 3-5 hrs  Limit carbohydrate intake to 45-60 grams carbohydrate/meal  Limit carbohydrate intake to 15-30 grams carbohydrate/snack  Add lean protein foods to meals/snacks  Monitor glucose levels as instructed by your doctor  Aim for 30 mins of physical activity daily  Bring food record and glucose log to your next nutrition visit     Expected Outcomes:  Demonstrated interest in learning. Expect positive outcomes  Education material provided: ADA Diabetes: Your Take Control Guide and Carbohydrate counting sheet  If problems or questions, patient to contact team via:  Phone and Email  Future DSME appointment: - Yearly

## 2018-05-16 NOTE — Patient Instructions (Signed)
Goals:  Follow Diabetes Meal Plan as instructed  Eat 3 meals and 2 snacks, every 3-5 hrs  Limit carbohydrate intake to 45-60 grams carbohydrate/meal  Limit carbohydrate intake to 15-30 grams carbohydrate/snack  Add lean protein foods to meals/snacks  Monitor glucose levels as instructed by your doctor  Aim for 30 mins of physical activity daily  Bring food record and glucose log to your next nutrition visit 

## 2018-05-16 NOTE — Progress Notes (Signed)
Tracheostomy Procedure Note  Derrick [name] [id number] 03/26/1981  Pre Procedure Tracheostomy Information  Trach Brand: Shiley Bivona Size: 6.0 Style: Uncuffed Secured by: Velcro   Procedure: Trach cleaning and trach change    Post Procedure Tracheostomy Information  Trach Brand: Shiley   Bivonna Size: 6.0 Style: Uncuffed Secured by: Velcro   Post Procedure Evaluation:  ETCO2 positive color change from yellow to purple : Yes.   Vital signs:blood pressure 129/77 pulse 84 , respirations 22 and pulse oximetry 98% on RA Patients current condition: stable Complications: No apparent complications Trach site exam: clean Wound care done: dry Patient did tolerate procedure well.   Education:none  Prescription needs: none    Additional needs: Pt was given a new PMV

## 2018-05-30 ENCOUNTER — Other Ambulatory Visit: Payer: Self-pay | Admitting: Internal Medicine

## 2018-06-03 ENCOUNTER — Other Ambulatory Visit: Payer: Self-pay | Admitting: Internal Medicine

## 2018-06-03 DIAGNOSIS — E118 Type 2 diabetes mellitus with unspecified complications: Secondary | ICD-10-CM

## 2018-06-20 ENCOUNTER — Encounter: Payer: Self-pay | Admitting: Pulmonary Disease

## 2018-06-20 ENCOUNTER — Ambulatory Visit (INDEPENDENT_AMBULATORY_CARE_PROVIDER_SITE_OTHER): Payer: BLUE CROSS/BLUE SHIELD | Admitting: Pulmonary Disease

## 2018-06-20 VITALS — BP 132/76 | HR 74 | Ht 70.0 in | Wt >= 6400 oz

## 2018-06-20 DIAGNOSIS — E662 Morbid (severe) obesity with alveolar hypoventilation: Secondary | ICD-10-CM

## 2018-06-20 DIAGNOSIS — J9612 Chronic respiratory failure with hypercapnia: Secondary | ICD-10-CM

## 2018-06-20 DIAGNOSIS — Z930 Tracheostomy status: Secondary | ICD-10-CM

## 2018-06-20 DIAGNOSIS — J9611 Chronic respiratory failure with hypoxia: Secondary | ICD-10-CM

## 2018-06-20 DIAGNOSIS — Z6841 Body Mass Index (BMI) 40.0 and over, adult: Secondary | ICD-10-CM

## 2018-06-20 DIAGNOSIS — G4733 Obstructive sleep apnea (adult) (pediatric): Secondary | ICD-10-CM

## 2018-06-20 NOTE — Progress Notes (Signed)
[facility]  Chief Complaint  Patient presents with  . Follow-up    pt of Derrick [name]'s at trach clinic for eval of decannulation.      Constitutional:  BP 132/76 (BP Location: Right Wrist, Cuff Size: Normal)   Pulse 74   Ht _0  (1.778 m)   Wt (!) 403 lb (182.8 kg)   SpO2 100%   BMI 57.82 kg/m   Past Medical History:  Diastolic CHF, DM, GERD, HTN, Allergies, Gout, Asthma, Neuropathy  Brief Summary:  Derrick [name] is a 37 y.o. male with chronic respiratory failure and sleep disordered breathing with chronic tracheostomy.  He is followed by Derrick [name] in tracheostomy clinic.  He has lost almost 100 lbs since 2016.  He has a #6 bivona tracheostomy.  He uses a PM valve during the day, and keeps it uncovered at night.  He is supposed to use supplemental oxygen at night, but doesn't use this every night.  He can cap the trach and has not problems breathing around the trach.   Physical Exam:   Appearance - well kempt   ENMT - clear nasal mucosa, midline nasal  septum, no oral exudates, no LAN, tracheostomy site clean  Respiratory - normal chest wall, normal respiratory effort, no accessory muscle use, no wheeze/rales  CV - s1s2 regular rate and rhythm, no murmurs, no peripheral edema, radial pulses symmetric  GI - soft, non tender, no masses  Lymph - no adenopathy noted in neck and axillary areas  MSK - normal gait  Ext - no cyanosis, clubbing, or joint inflammation noted  Skin - no rashes, lesions, or ulcers  Neuro - normal strength, oriented x 3  Psych - normal mood and affect   CMP Latest Ref Rng & Units 04/09/2018 11/12/2017 04/18/2017  Glucose 70 - 99 mg/dL 279(H) 433(H) 367(H)  BUN 6 - 23 mg/dL _1 Creatinine 0.40 - 1.50 mg/dL 0.89 0.96 0.94  Sodium 135 - 145 mEq/L 139 133(L) 133(L)  Potassium 3.5 - 5.1 mEq/L 2.9(L) 3.2(L) 3.2(L)  Chloride 96 - 112 mEq/L 94(L) 90(L) 90(L)  CO2 19 - 32 mEq/L 36(H) 33(H)  37(H)  Calcium 8.4 - 10.5 mg/dL 8.5 9.1 9.5  Total Protein 6.0 - 8.3 g/dL 7.9 7.7 8.0  Total Bilirubin 0.2 - 1.2 mg/dL 0.7 0.6 0.6  Alkaline Phos 39 - 117 U/L 130(H) 155(H) 150(H)  AST 0 - 37 U/L 17 33 30  ALT 0 - 53 U/L 22 34 35    CBC Latest Ref Rng & Units 11/12/2017 04/18/2017 06/16/2016  WBC 4.0 - 10.5 K/uL 8.7 9.4 13.4(H)  Hemoglobin 13.0 - 17.0 g/dL 13.1 12.9(L) 13.2  Hematocrit 39.0 - 52.0 % 38.7(L) 39.9 39.5  Platelets 150.0 - 400.0 K/uL 214.0 217.0 214.0    ABG    Component Value Date/Time   PHART 7.339 (L) 07/02/2015 0740   PCO2ART 74.4 (HH) 07/02/2015 0740   PO2ART 66.7 (L) 07/02/2015 0740   HCO3 39.2 (H) 07/02/2015 0740   TCO2 41.5 07/02/2015 0740   O2SAT 91.7 07/02/2015 0740     Discussion:  He has sleep disordered breathing and chronic hypoxic,hypercapnic respiratory failure in setting of obstructive sleep apnea and obesity hypoventilation syndrome.  He has been tracheostomy dependent.  He has lost significant amount of weight over the past 3 years, and would like to see if he is a candidate to change to PAP therapy and then have tracheostomy removed.  Plan:  - Will  arrange for in lab split night sleep study with his tracheostomy capped - explained that he might need to undergo several sleep studies before ultimately determining whether he is a candidate for tracheostomy removal - encouraged him to keep up with his weight loss efforts - discussed role of supplemental oxygen and encouraged him to use this on consistent basis at night for now   Patient Instructions  Will arrange for in lab sleep study with your tracheostomy capped  Will call to schedule follow up after your sleep study is reviewed  Time spent 28 minutes  Derrick Mires, MD [place] Pager: [phone] 06/20/2018, 10:53 AM  Flow Sheet    Sleep tests:    Cardiac tests:  Echo 06/14/15 >> EF 55 to 60%, grade 2 DD   Medications:   Allergies as of 06/20/2018       Reactions   Shrimp [shellfish Allergy]    Vancomycin Other (See Comments)   Zosyn [piperacillin Sod-tazobactam So] Other (See Comments)      Medication List        Accurate as of 06/20/18 10:53 AM. Always use your most recent med list.          albuterol 108 (90 Base) MCG/ACT inhaler Commonly known as:  PROVENTIL HFA;VENTOLIN HFA Inhale 2 puffs into the lungs every 6 (six) hours as needed for wheezing or shortness of breath.   allopurinol 300 MG tablet Commonly known as:  ZYLOPRIM TAKE 1 TABLET BY MOUTH ONCE DAILY   BAYER CONTOUR NEXT MONITOR w/Device Kit Use to check blood sugars daily Dx E11.9   cetirizine 10 MG tablet Commonly known as:  ZYRTEC Take 1 tablet (10 mg total) by mouth daily.   famotidine 20 MG tablet Commonly known as:  PEPCID TAKE ONE TABLET BY MOUTH ONCE DAILY   fluticasone 110 MCG/ACT inhaler Commonly known as:  FLOVENT HFA Inhale 2 puffs into the lungs 2 (two) times daily.   furosemide 80 MG tablet Commonly known as:  LASIX TAKE 1 TABLET BY MOUTH 4 TIMES DAILY   gabapentin 300 MG capsule Commonly known as:  NEURONTIN TAKE 2 CAPSULES BY MOUTH 4 TIMES DAILY   glipiZIDE 10 MG 24 hr tablet Commonly known as:  GLUCOTROL XL Take 1 tablet (10 mg total) by mouth daily with breakfast.   glucose blood test strip Use as instructed   guaiFENesin 600 MG 12 hr tablet Commonly known as:  MUCINEX Take 1 tablet (600 mg total) by mouth 2 (two) times daily.   metoprolol tartrate 50 MG tablet Commonly known as:  LOPRESSOR TAKE 1 TABLET BY MOUTH TWICE DAILY   montelukast 10 MG tablet Commonly known as:  SINGULAIR Take 1 tablet (10 mg total) by mouth at bedtime.   ONETOUCH DELICA LANCETS 76B Misc Use as needed daily   pioglitazone 30 MG tablet Commonly known as:  ACTOS Take 1 tablet (30 mg total) by mouth daily.   potassium chloride SA 20 MEQ tablet Commonly known as:  K-DUR,KLOR-CON Take 1 tablet (20 mEq total) by mouth 3 (three) times daily.     ranitidine 150 MG capsule Commonly known as:  ZANTAC Take 1 capsule (150 mg total) by mouth 2 (two) times daily.   sildenafil 100 MG tablet Commonly known as:  VIAGRA TAKE 1/2 TO 1 (ONE-HALF TO ONE) TABLET BY MOUTH ONCE DAILY AS NEEDED FOR ERECTILE DYSFUNCTION   sitaGLIPtin 100 MG tablet Commonly known as:  JANUVIA Take 1 tablet (100 mg total) by mouth daily.  Tracheostomy Care Kit 30 kits by Does not apply route daily.       Past Surgical History:  He  has a past surgical history that includes Tracheostomy tube placement (N/A, 06/21/2015).  Family History:  His family history includes Asthma in his other; Cancer in his other; Diabetes in his maternal grandmother; Heart disease in his other; Hypertension in his maternal grandmother; Stroke in his other.  Social History:  He  reports that he has never smoked. He has never used smokeless tobacco. He reports that he does not drink alcohol or use drugs.

## 2018-06-20 NOTE — Patient Instructions (Signed)
Will arrange for in lab sleep study with your tracheostomy capped  Will call to schedule follow up after your sleep study is reviewed

## 2018-07-14 ENCOUNTER — Encounter (HOSPITAL_BASED_OUTPATIENT_CLINIC_OR_DEPARTMENT_OTHER): Payer: Self-pay | Admitting: Adult Health

## 2018-07-14 ENCOUNTER — Other Ambulatory Visit: Payer: Self-pay

## 2018-07-14 ENCOUNTER — Emergency Department (HOSPITAL_BASED_OUTPATIENT_CLINIC_OR_DEPARTMENT_OTHER)
Admission: EM | Admit: 2018-07-14 | Discharge: 2018-07-15 | Disposition: A | Discharge status: Home / Self Care | Payer: BLUE CROSS/BLUE SHIELD | Attending: Emergency Medicine | Admitting: Emergency Medicine

## 2018-07-14 DIAGNOSIS — Z7984 Long term (current) use of oral hypoglycemic drugs: Secondary | ICD-10-CM | POA: Insufficient documentation

## 2018-07-14 DIAGNOSIS — Z79899 Other long term (current) drug therapy: Secondary | ICD-10-CM | POA: Diagnosis not present

## 2018-07-14 DIAGNOSIS — Z930 Tracheostomy status: Secondary | ICD-10-CM | POA: Diagnosis not present

## 2018-07-14 DIAGNOSIS — I110 Hypertensive heart disease with heart failure: Secondary | ICD-10-CM | POA: Diagnosis not present

## 2018-07-14 DIAGNOSIS — I5032 Chronic diastolic (congestive) heart failure: Secondary | ICD-10-CM | POA: Insufficient documentation

## 2018-07-14 DIAGNOSIS — E119 Type 2 diabetes mellitus without complications: Secondary | ICD-10-CM | POA: Insufficient documentation

## 2018-07-14 DIAGNOSIS — R0981 Nasal congestion: Secondary | ICD-10-CM | POA: Diagnosis not present

## 2018-07-14 DIAGNOSIS — J069 Acute upper respiratory infection, unspecified: Secondary | ICD-10-CM | POA: Diagnosis not present

## 2018-07-14 NOTE — ED Triage Notes (Signed)
PResents with nasal congestion since Friday. Denies fevers. Endorses dry cough as well.

## 2018-07-14 NOTE — Discharge Instructions (Addendum)
Please take Tylenol (acetaminophen) to relieve your pain.  You may take tylenol, up to 1,000 mg (two extra strength pills).  Do not take more than 3,000 mg tylenol in a 24 hour period.  Please check all medication labels as many medications such as pain and cold medications may contain tylenol. Please do not drink alcohol while taking this medication.   While in the emergency room your blood pressure was slightly high, please follow-up with your primary care doctor for recheck in the next 1 week.

## 2018-07-14 NOTE — ED Provider Notes (Signed)
[facility] EMERGENCY DEPARTMENT Provider Note   CSN: [id number] Arrival date & time: 07/14/18  2022     History   Chief Complaint Chief Complaint  Patient presents with  . Nasal Congestion    HPI Derrick [name] is a 37 y.o. male with a past medical history of tracheostomy secondary to sleep apnea, morbid obesity, CHF, DM, hypertension who presents today for evaluation of nasal congestion since Friday.  He denies any fevers.  He has been taking Mucinex D at home with mild relief.  He feels like he is coughing a lot at night.  His cough is normally dry.  He reports that he is able to clear his trach and does not suction himself.  HPI  Past Medical History:  Diagnosis Date  . Acute on chronic diastolic CHF (congestive heart failure), NYHA class 1 ([place])   . Diabetes mellitus without complication ([place])   . GERD (gastroesophageal reflux disease)   . Hypertension   . Reflux     Patient Active Problem List   Diagnosis Date Noted  . Tracheostomy care ([place])   . Vitamin D deficiency 11/12/2017  . Tinnitus 09/27/2017  . Numbness and tingling of hand 11/30/2016  . Tracheostomy dependent ([place])   . Routine general medical examination at a health care facility 09/21/2016  . Acne 09/21/2016  . ED (erectile dysfunction) 06/02/2016  . Tracheostomy status ([place])   . Other allergic rhinitis   . Diabetes mellitus type 2 with complications ([place]) 40/98/1191  . Left ankle pain 08/11/2015  . Dysphagia   . Pulmonary hypertension ([place])   . Morbid obesity ([place]) 06/13/2015  . Essential hypertension 06/13/2015  . OSA (obstructive sleep apnea) 06/13/2015  . Obesity hypoventilation syndrome ([place]) 06/13/2015  . Chronic diastolic heart failure ([place]) 06/13/2015    Past Surgical History:  Procedure Laterality Date  . TRACHEOSTOMY TUBE PLACEMENT N/A 06/21/2015   Procedure: TRACHEOSTOMY;  Surgeon: Leta Baptist, MD;  Location: [facility] OR;  Service: ENT;  Laterality: N/A;        Home  Medications    Prior to Admission medications   Medication Sig Start Date End Date Taking? Authorizing Provider  guaiFENesin (MUCINEX) 600 MG 12 hr tablet Take 1 tablet (600 mg total) by mouth 2 (two) times daily. 09/18/16  Yes Hoyt Koch, MD  albuterol (PROVENTIL HFA;VENTOLIN HFA) 108 (90 Base) MCG/ACT inhaler Inhale 2 puffs into the lungs every 6 (six) hours as needed for wheezing or shortness of breath. 02/20/17   Golden Circle, FNP  allopurinol (ZYLOPRIM) 300 MG tablet TAKE 1 TABLET BY MOUTH ONCE DAILY 05/31/18   Hoyt Koch, MD  Blood Glucose Monitoring Suppl (BAYER CONTOUR NEXT MONITOR) w/Device KIT Use to check blood sugars daily Dx E11.9 10/16/16   Hoyt Koch, MD  cetirizine (ZYRTEC) 10 MG tablet Take 1 tablet (10 mg total) by mouth daily. 04/09/18   Hoyt Koch, MD  famotidine (PEPCID) 20 MG tablet TAKE ONE TABLET BY MOUTH ONCE DAILY 09/04/16   Hoyt Koch, MD  fluticasone (FLOVENT HFA) 110 MCG/ACT inhaler Inhale 2 puffs into the lungs 2 (two) times daily. Patient not taking: Reported on 06/20/2018 02/20/17   Golden Circle, FNP  furosemide (LASIX) 80 MG tablet TAKE 1 TABLET BY MOUTH 4 TIMES DAILY 05/31/18   Hoyt Koch, MD  gabapentin (NEURONTIN) 300 MG capsule TAKE 2 CAPSULES BY MOUTH 4 TIMES DAILY 06/04/18   Hoyt Koch, MD  glipiZIDE (GLUCOTROL XL) 10 MG 24  hr tablet Take 1 tablet (10 mg total) by mouth daily with breakfast. 11/27/17   Hoyt Koch, MD  glucose blood test strip Use as instructed 11/30/16   Hoyt Koch, MD  metoprolol tartrate (LOPRESSOR) 50 MG tablet TAKE 1 TABLET BY MOUTH TWICE DAILY 04/30/18   Hoyt Koch, MD  montelukast (SINGULAIR) 10 MG tablet Take 1 tablet (10 mg total) by mouth at bedtime. 04/09/18   Hoyt Koch, MD  [facility] DELICA LANCETS 17O MISC Use as needed daily 11/30/16   Hoyt Koch, MD  pioglitazone (ACTOS) 30 MG tablet Take 1 tablet (30 mg  total) by mouth daily. 04/09/18   Hoyt Koch, MD  potassium chloride SA (K-DUR,KLOR-CON) 20 MEQ tablet Take 1 tablet (20 mEq total) by mouth 3 (three) times daily. Patient not taking: Reported on 06/20/2018 02/03/16   Hoyt Koch, MD  ranitidine (ZANTAC) 150 MG capsule Take 1 capsule (150 mg total) by mouth 2 (two) times daily. 11/23/16   Hoyt Koch, MD  sildenafil (VIAGRA) 100 MG tablet TAKE 1/2 TO 1 (ONE-HALF TO ONE) TABLET BY MOUTH ONCE DAILY AS NEEDED FOR ERECTILE DYSFUNCTION 04/26/18   Hoyt Koch, MD  sitaGLIPtin (JANUVIA) 100 MG tablet Take 1 tablet (100 mg total) by mouth daily. 04/09/18   Hoyt Koch, MD  Tracheostomy Care KIT 30 kits by Does not apply route daily. 09/27/15   Erick Colace, NP    Family History Family History  Problem Relation Age of Onset  . Diabetes Maternal Grandmother   . Hypertension Maternal Grandmother   . Asthma Other   . Cancer Other   . Stroke Other   . Heart disease Other     Social History Social History   Tobacco Use  . Smoking status: Never Smoker  . Smokeless tobacco: Never Used  Substance Use Topics  . Alcohol use: No  . Drug use: No     Allergies   Shrimp [shellfish allergy]; Vancomycin; and Zosyn [piperacillin sod-tazobactam so]   Review of Systems Review of Systems  Constitutional: Negative for chills and fever.  HENT: Positive for congestion, postnasal drip, rhinorrhea, sinus pressure and sinus pain. Negative for ear discharge, ear pain, facial swelling, sore throat and trouble swallowing.   Respiratory: Positive for cough. Negative for chest tightness and shortness of breath.   Cardiovascular: Negative for chest pain.  Gastrointestinal: Positive for diarrhea (1-2 bouts). Negative for abdominal pain, nausea and vomiting.  Genitourinary: Negative for dysuria, frequency and urgency.  Musculoskeletal: Negative for back pain.  Skin: Negative for wound.  Neurological: Negative for  light-headedness and headaches.  All other systems reviewed and are negative.    Physical Exam Updated Vital Signs BP (!) 158/93   Pulse 99   Temp 98.5 F (36.9 C) (Oral)   Resp (!) 22   Ht '5\' 10"'$  (1.778 m)   Wt (!) 179.3 kg   SpO2 95%   BMI 56.72 kg/m   Physical Exam Constitutional:      General: He is not in acute distress.    Appearance: He is well-developed. He is obese. He is not diaphoretic.  HENT:     Head: Normocephalic and atraumatic.     Right Ear: Tympanic membrane, ear canal and external ear normal.     Left Ear: Tympanic membrane, ear canal and external ear normal.     Nose: Mucosal edema and rhinorrhea present.     Mouth/Throat:     Pharynx: Uvula midline. No  oropharyngeal exudate.     Tonsils: No tonsillar exudate.  Eyes:     General: No scleral icterus.    Conjunctiva/sclera: Conjunctivae normal.  Neck:     Musculoskeletal: Full passive range of motion without pain, normal range of motion and neck supple.     Trachea: Tracheostomy present. No tracheal tenderness or tracheal deviation.     Comments: Tracheostomy does not have abnormal drainage.   Cardiovascular:     Rate and Rhythm: Normal rate and regular rhythm.     Heart sounds: Normal heart sounds.  Pulmonary:     Effort: Pulmonary effort is normal. No accessory muscle usage or respiratory distress.     Breath sounds: Normal breath sounds and air entry. No decreased breath sounds, wheezing, rhonchi or rales.     Comments: Lungs are clear to auscultation bilaterally both when breathing through mouth and when breathing through trach. Lymphadenopathy:     Cervical: No cervical adenopathy.  Skin:    General: Skin is warm and dry.  Neurological:     Mental Status: He is alert.  Psychiatric:        Behavior: Behavior normal.      ED Treatments / Results  Labs (all labs ordered are listed, but only abnormal results are displayed) Labs Reviewed - No data to display  EKG None  Radiology No  results found.  Procedures Procedures (including critical care time)  Medications Ordered in ED Medications - No data to display   Initial Impression / Assessment and Plan / ED Course  I have reviewed the triage vital signs and the nursing notes.  Pertinent labs & imaging results that were available during my care of the patient were reviewed by me and considered in my medical decision making (see chart for details).     Patients symptoms are consistent with URI, likely viral etiology. Discussed that antibiotics are not indicated for viral infections. Pt will be discharged with symptomatic treatment.  Lungs are clear to auscultation bilaterally, he is afebrile and denies shortness of breath.  Verbalizes understanding and is agreeable with plan. Pt is hemodynamically stable & in NAD prior to dc.    Final Clinical Impressions(s) / ED Diagnoses   Final diagnoses:  Nasal congestion  Upper respiratory tract infection, unspecified type    ED Discharge Orders    None       Lorin Glass, PA-C 07/15/18 0009    Gareth Morgan, MD 07/16/18 [phone]

## 2018-07-14 NOTE — ED Notes (Signed)
Patient left at this time with all belongings. 

## 2018-07-18 ENCOUNTER — Ambulatory Visit (HOSPITAL_COMMUNITY)
Admission: RE | Admit: 2018-07-18 | Discharge: 2018-07-18 | Disposition: A | Discharge status: Home / Self Care | Payer: BLUE CROSS/BLUE SHIELD | Source: Ambulatory Visit | Attending: Acute Care | Admitting: Acute Care

## 2018-07-18 DIAGNOSIS — Z430 Encounter for attention to tracheostomy: Secondary | ICD-10-CM | POA: Insufficient documentation

## 2018-07-18 DIAGNOSIS — G4733 Obstructive sleep apnea (adult) (pediatric): Secondary | ICD-10-CM | POA: Diagnosis not present

## 2018-07-18 DIAGNOSIS — Z9889 Other specified postprocedural states: Secondary | ICD-10-CM

## 2018-07-18 DIAGNOSIS — Z4682 Encounter for fitting and adjustment of non-vascular catheter: Secondary | ICD-10-CM | POA: Insufficient documentation

## 2018-07-18 NOTE — Progress Notes (Addendum)
Tracheostomy Procedure Note  Derrick HansenReginald A [name] [id number] 1981-01-13  Pre Procedure Tracheostomy Information  Trach Brand:  Lavone OrnBrivona  Size: 6.0 Style: Uncuffed Secured by: Velcro   Procedure: trach change out    Post Procedure Tracheostomy Information  Trach Brand:  Brivona   Size: 6.0 Style:uncuffed Secured by: Velcro   Post Procedure Evaluation:  ETCO2 positive color change from yellow to purple : yes Vital signs:blood pressure 165/89, pulse 87, respirations 20 and pulse oximetry 97  % Patients current condition: {stable Complications: No apparent complications Trach site exam: clean, dry, healed Wound care done ;dry, sterile, 4x4 dressing Patient did tolerate procedure well.   Education: none   Prescription needs:  None needed    Additional needs: Recheck 5 weeks

## 2018-07-18 NOTE — Progress Notes (Signed)
[facility]   Reason for visit:  Routine tracheostomy change HPI:  37 year old male patient well-known to me.  I follow him for tracheostomy management in the setting of severe sleep apnea.  He presents today for routine tracheostomy change.  No new complaints with the exception of a recent URI onset was about a week ago, feels like he is improving slowly ROS  Review of Systems - History obtained from the patient General ROS: negative for - chills, fatigue, fever, malaise, night sweats, sleep disturbance or weight gain Psychological ROS: negative ENT ROS: positive for - nasal congestion, nasal discharge and sneezing negative for - epistaxis, headaches, hearing change, nasal polyps, oral lesions, sinus pain, tinnitus or vertigo Allergy and Immunology ROS: positive for - nasal congestion, postnasal drip and seasonal allergies Hematological and Lymphatic ROS: negative Endocrine ROS: negative Respiratory ROS: positive for - cough Cardiovascular ROS: no chest pain or dyspnea on exertion Gastrointestinal ROS: no abdominal pain, change in bowel habits, or black or bloody stools Genito-Urinary ROS: no dysuria, trouble voiding, or hematuria Musculoskeletal ROS: negative Neurological ROS: no TIA or stroke symptoms  Vital signs:  Reviewed  Exam:  Physical Exam Vitals signs reviewed.  Constitutional:      General: He is not in acute distress.    Appearance: Normal appearance. He is not ill-appearing, toxic-appearing or diaphoretic.  HENT:     Head: Normocephalic and atraumatic.     Nose: Nose normal.     Mouth/Throat:     Pharynx: Oropharynx is clear.  Eyes:     Extraocular Movements: Extraocular movements intact.     Pupils: Pupils are equal, round, and reactive to light.  Neck:     Musculoskeletal: Normal range of motion.     Comments: Tracheostomy stoma site unremarkable. Excellent phonation w/ PMV in place  Cardiovascular:     Rate and Rhythm: Normal rate.   No extrasystoles are present.    Chest Wall: PMI is not displaced.  Pulmonary:     Effort: Pulmonary effort is normal.     Breath sounds: Normal breath sounds.  Chest:     Chest wall: No mass.  Abdominal:     General: There is no distension. There are no signs of injury.     Palpations: Abdomen is soft.  Neurological:     General: No focal deficit present.     Mental Status: He is alert and oriented to person, place, and time. Mental status is at baseline.     Trach change/procedure: #6 Portex was removed without difficulty.  The site was prepped with lidocaine jelly, new tracheostomy placed without difficulty checked with end-tidal CO2      Impression/dx  Severe OSA Tracheostomy dependence  Discussion  [name] is continuing to do well from a pulmonary and tracheostomy standpoint.  I recently referred him to Dr. Craige CottaSood Who saw him on 11/21.  We are currently working on arranging a sleep study to determine if he has a tracheostomy candidate Plan  Continue routine tracheostomy care Plan for repeat/return visit 5 weeks for tracheostomy change    Visit time: 23 minutes.   Simonne MartinetPeter E Chasey Dull ACNP-BC [facility]

## 2018-07-26 ENCOUNTER — Other Ambulatory Visit: Payer: Self-pay | Admitting: Internal Medicine

## 2018-07-29 ENCOUNTER — Telehealth: Payer: Self-pay

## 2018-07-29 NOTE — Telephone Encounter (Signed)
Can you call and see if patient would like to be seen tomorrow we have an 11:40 open and we have availability on the second as well.

## 2018-07-29 NOTE — Telephone Encounter (Signed)
Patient scheduled for 8am.

## 2018-07-29 NOTE — Telephone Encounter (Signed)
Copied from CRM [id number]. Topic: General - Other >> Jul 29, 2018 11:20 AM Jilda Rocheemaray, Melissa wrote: Reason for CRM: Patient would like to be worked in sooner if possible  Best call back is [phone]

## 2018-07-30 ENCOUNTER — Ambulatory Visit: Payer: BLUE CROSS/BLUE SHIELD | Admitting: Internal Medicine

## 2018-07-30 ENCOUNTER — Other Ambulatory Visit (INDEPENDENT_AMBULATORY_CARE_PROVIDER_SITE_OTHER): Payer: BLUE CROSS/BLUE SHIELD

## 2018-07-30 ENCOUNTER — Encounter: Payer: Self-pay | Admitting: Internal Medicine

## 2018-07-30 VITALS — BP 130/90 | HR 85 | Temp 98.0°F | Ht 70.0 in | Wt 398.0 lb

## 2018-07-30 DIAGNOSIS — J0110 Acute frontal sinusitis, unspecified: Secondary | ICD-10-CM | POA: Diagnosis not present

## 2018-07-30 DIAGNOSIS — E118 Type 2 diabetes mellitus with unspecified complications: Secondary | ICD-10-CM | POA: Diagnosis not present

## 2018-07-30 DIAGNOSIS — Z930 Tracheostomy status: Secondary | ICD-10-CM | POA: Diagnosis not present

## 2018-07-30 DIAGNOSIS — J329 Chronic sinusitis, unspecified: Secondary | ICD-10-CM | POA: Insufficient documentation

## 2018-07-30 DIAGNOSIS — E662 Morbid (severe) obesity with alveolar hypoventilation: Secondary | ICD-10-CM

## 2018-07-30 DIAGNOSIS — J019 Acute sinusitis, unspecified: Secondary | ICD-10-CM | POA: Insufficient documentation

## 2018-07-30 LAB — HEMOGLOBIN A1C: HEMOGLOBIN A1C: 6.9 % — AB (ref 4.6–6.5)

## 2018-07-30 MED ORDER — ALBUTEROL SULFATE HFA 108 (90 BASE) MCG/ACT IN AERS: 2.0000 | INHALATION_SPRAY | Freq: Four times a day (QID) | RESPIRATORY_TRACT | 0 refills | Status: DC | PRN

## 2018-07-30 MED ORDER — DOXYCYCLINE HYCLATE 100 MG PO TABS: 100.0000 mg | ORAL_TABLET | Freq: Two times a day (BID) | ORAL | 0 refills | Status: DC

## 2018-07-30 MED ORDER — METOPROLOL TARTRATE 50 MG PO TABS: 50.0000 mg | ORAL_TABLET | Freq: Two times a day (BID) | ORAL | 1 refills | Status: DC

## 2018-07-30 MED ORDER — FLUTICASONE PROPIONATE HFA 110 MCG/ACT IN AERO: 2.0000 | INHALATION_SPRAY | Freq: Two times a day (BID) | RESPIRATORY_TRACT | 2 refills | Status: DC

## 2018-07-30 NOTE — Progress Notes (Signed)
   Subjective:   Patient ID: Derrick [name], male    DOB: [birth year], 37 y.o.   MRN: [id number]  HPI The patient is a 37 y.o. man coming in for cold and sinus symptoms. Started about 3-4 weeks ago. Seen at urgent care and not given anything about 2 weeks ago. The symptoms have not improved since that time. Main symptoms are: sinus pressure, cough with sputum yellow to clear, nose drainage, intermittent ear pain. Denies fevers or chills. No SOB and not using albuterol although he is out of this. Overall it is worsening. Has tried zyrtec, singulair, tylenol. Also needs follow up of his diabetes and is taking actos and januvia and glipizide currently.   Review of Systems  Constitutional: Positive for activity change and appetite change. Negative for chills, fatigue, fever and unexpected weight change.  HENT: Positive for congestion, ear pain, postnasal drip, rhinorrhea and sinus pressure. Negative for ear discharge, sinus pain, sneezing, sore throat, tinnitus, trouble swallowing and voice change.   Eyes: Negative.   Respiratory: Positive for cough and shortness of breath. Negative for chest tightness and wheezing.   Cardiovascular: Negative.  Negative for chest pain, palpitations and leg swelling.  Gastrointestinal: Negative.  Negative for abdominal distention, abdominal pain, constipation, diarrhea, nausea and vomiting.  Musculoskeletal: Positive for myalgias.  Skin: Negative.   Neurological: Negative.   Psychiatric/Behavioral: Negative.     Objective:  Physical Exam Constitutional:      Appearance: He is well-developed.  HENT:     Head: Normocephalic and atraumatic.     Comments: Oropharynx with redness and clear drainage, nose with swollen turbinates, TMs normal bilaterally.  Neck:     Musculoskeletal: Normal range of motion.     Thyroid: No thyromegaly.  Cardiovascular:     Rate and Rhythm: Normal rate and regular rhythm.  Pulmonary:     Effort: Pulmonary effort is normal. No  respiratory distress.     Breath sounds: No wheezing or rales.     Comments: Trach, exam limited by habitus but good airflow without obvious wheezing or rhonchi Abdominal:     General: Bowel sounds are normal. There is no distension.     Palpations: Abdomen is soft.     Tenderness: There is no abdominal tenderness. There is no rebound.  Musculoskeletal:        General: Tenderness present.  Lymphadenopathy:     Cervical: No cervical adenopathy.  Skin:    General: Skin is warm and dry.  Neurological:     Mental Status: He is alert and oriented to person, place, and time.     Coordination: Coordination normal.     Vitals:   07/30/18 0809  BP: 130/90  Pulse: 85  Temp: 98 F (36.7 C)  TempSrc: Oral  SpO2: 96%  Weight: (!) 398 lb (180.5 kg)  Height: 5\' 10"  (1.778 m)    Assessment & Plan:

## 2018-07-30 NOTE — Patient Instructions (Signed)
We will check the labs today for diabetes.   We have sent in an antibiotic called doxycycline for the sinuses to take 1 pill twice a day for 10 days.

## 2018-07-30 NOTE — Assessment & Plan Note (Signed)
Trach uninfected today.

## 2018-07-30 NOTE — Assessment & Plan Note (Signed)
Rx for doxycycline and encouraged to continue with zyrtec, singulair. Refilled flonase.

## 2018-07-30 NOTE — Assessment & Plan Note (Signed)
Lung exam stable, refill albuterol.

## 2018-07-30 NOTE — Assessment & Plan Note (Signed)
Checking HgA1c, taking actos and januvia and glipizide. Adjust as needed but previously moderately uncontrolled and with many complications.

## 2018-08-02 ENCOUNTER — Encounter

## 2018-08-02 ENCOUNTER — Ambulatory Visit: Payer: BLUE CROSS/BLUE SHIELD | Admitting: Internal Medicine

## 2018-08-08 ENCOUNTER — Encounter (HOSPITAL_BASED_OUTPATIENT_CLINIC_OR_DEPARTMENT_OTHER): Payer: BLUE CROSS/BLUE SHIELD

## 2018-08-18 ENCOUNTER — Ambulatory Visit (HOSPITAL_BASED_OUTPATIENT_CLINIC_OR_DEPARTMENT_OTHER): Payer: BLUE CROSS/BLUE SHIELD

## 2018-08-22 ENCOUNTER — Ambulatory Visit (HOSPITAL_COMMUNITY)
Admission: RE | Admit: 2018-08-22 | Discharge: 2018-08-22 | Disposition: A | Discharge status: Home / Self Care | Payer: BLUE CROSS/BLUE SHIELD | Source: Ambulatory Visit | Attending: Acute Care | Admitting: Acute Care

## 2018-08-22 DIAGNOSIS — Z4682 Encounter for fitting and adjustment of non-vascular catheter: Secondary | ICD-10-CM | POA: Diagnosis not present

## 2018-08-22 DIAGNOSIS — Z930 Tracheostomy status: Secondary | ICD-10-CM | POA: Insufficient documentation

## 2018-08-22 DIAGNOSIS — G4733 Obstructive sleep apnea (adult) (pediatric): Secondary | ICD-10-CM | POA: Insufficient documentation

## 2018-08-22 NOTE — Progress Notes (Signed)
Tracheostomy Procedure Note  Derrick [name] [id number] January 28, 1981  Pre Procedure Tracheostomy Information  Trach Brand: Portex Bivona Size: 6 Style: Uncuffed Secured by: Velcro   Procedure: trach change    Post Procedure Tracheostomy Information  Trach Brand: Portex Bivona Size: 6 Style: Uncuffed Secured by: Velcro   Post Procedure Evaluation:  ETCO2 positive color change from yellow to purple : Yes.   Vital signs: pulse 88, respirations 20 and pulse oximetry 98% on RA with PMV Patients current condition: stable Complications: No apparent complications Trach site exam: clean, dry Wound care done: 4 x 4 gauze Patient did tolerate procedure well.   Education: None  Prescription needs: None   See back in trach clinic in 5 weeks 6 red caps sent home with him for capping

## 2018-08-22 NOTE — Progress Notes (Addendum)
[facility]   Reason for visit:  Routine tracheostomy change HPI:  38 year old male patient well-known to me.  I follow him for tracheostomy management in the setting of severe sleep apnea.  Presents today for routine tracheostomy change ROS  Review of Systems - History obtained from the patient General ROS: negative for - chills, fatigue, fever, malaise, night sweats, sleep disturbance or weight gain Psychological ROS: negative ENT ROS: negative for - epistaxis, headaches, hearing change, nasal polyps, oral lesions, sinus pain, tinnitus or vertigo Allergy and Immunology ROS: negative Hematological and Lymphatic ROS: negative Endocrine ROS: negative Respiratory ROS: no cough, shortness of breath, or wheezing positive for - cough and no new changes Cardiovascular ROS: no chest pain or dyspnea on exertion Gastrointestinal ROS: no abdominal pain, change in bowel habits, or black or bloody stools Genito-Urinary ROS: no dysuria, trouble voiding, or hematuria Musculoskeletal ROS: negative Neurological ROS: no TIA or stroke symptoms    Vital signs:  Reviewed  Exam:  Physical Exam Vitals signs reviewed.  Constitutional:      General: He is not in acute distress.    Appearance: Normal appearance. He is not ill-appearing.  HENT:     Head: Normocephalic and atraumatic.     Mouth/Throat:     Mouth: Mucous membranes are moist.     Pharynx: No posterior oropharyngeal erythema.  Eyes:     Pupils: Pupils are equal, round, and reactive to light.  Neck:     Musculoskeletal: Normal range of motion.     Comments: Janina Mayo site was unremarkable  Cardiovascular:     Rate and Rhythm: Normal rate and regular rhythm.  Pulmonary:     Effort: Pulmonary effort is normal.     Breath sounds: Normal breath sounds.  Abdominal:     General: Bowel sounds are normal.     Palpations: Abdomen is soft.  Musculoskeletal: Normal range of motion.  Skin:    General: Skin is warm.   Capillary Refill: Capillary refill takes less than 2 seconds.  Neurological:     General: No focal deficit present.     Mental Status: He is alert.     Trach change/procedure:  The #6 tracheostomy was removed, site was unremarkable.  Following that a size 6 Portex cuffless trach was replaced without difficulty end-tidal CO2 checked     Impression/dx  Severe OSA Tracheostomy dependence  Discussion  No sig change. tol trach well. Awaiting sleep study Plan  Will see again in 5 weeks for routine trach change  Red capping valves provided for sleep study    Visit time: 23 minutes.   [name] ACNP-BC [facility] Pulmonary/Critical Care

## 2018-09-06 ENCOUNTER — Other Ambulatory Visit: Payer: Self-pay | Admitting: Internal Medicine

## 2018-09-09 ENCOUNTER — Ambulatory Visit (HOSPITAL_BASED_OUTPATIENT_CLINIC_OR_DEPARTMENT_OTHER): Payer: BLUE CROSS/BLUE SHIELD | Attending: Pulmonary Disease | Admitting: Pulmonary Disease

## 2018-09-09 ENCOUNTER — Telehealth: Payer: Self-pay | Admitting: Internal Medicine

## 2018-09-09 VITALS — Ht 70.0 in | Wt 370.0 lb

## 2018-09-09 DIAGNOSIS — J9612 Chronic respiratory failure with hypercapnia: Secondary | ICD-10-CM | POA: Diagnosis not present

## 2018-09-09 DIAGNOSIS — J9611 Chronic respiratory failure with hypoxia: Secondary | ICD-10-CM | POA: Diagnosis not present

## 2018-09-09 DIAGNOSIS — Z6841 Body Mass Index (BMI) 40.0 and over, adult: Secondary | ICD-10-CM | POA: Diagnosis not present

## 2018-09-09 DIAGNOSIS — E662 Morbid (severe) obesity with alveolar hypoventilation: Secondary | ICD-10-CM

## 2018-09-09 DIAGNOSIS — Z930 Tracheostomy status: Secondary | ICD-10-CM

## 2018-09-09 DIAGNOSIS — G4733 Obstructive sleep apnea (adult) (pediatric): Secondary | ICD-10-CM | POA: Diagnosis not present

## 2018-09-09 NOTE — Telephone Encounter (Unsigned)
Copied from CRM [id number]. Topic: General - Other >> Sep 09, 2018  1:52 PM [name], Derrick wrote: Reason for CRM: Pt called in upset that the refill request for allopurinol (ZYLOPRIM) 300 MG tablet was denied. Pt stated he is out of the medication and needs it for gout. Pt requests call back. Cb# [phone]

## 2018-09-09 NOTE — Telephone Encounter (Signed)
Is this okay to refill? 

## 2018-09-09 NOTE — Telephone Encounter (Signed)
Pt. Reports he is out of his Allopurinol.States [facility] reports he picked up a refill 08/08/18.Pt. disputes this. Can a 30 day supply be sent until this is settled?

## 2018-09-26 ENCOUNTER — Other Ambulatory Visit: Payer: Self-pay | Admitting: Internal Medicine

## 2018-09-29 ENCOUNTER — Telehealth: Payer: Self-pay | Admitting: Pulmonary Disease

## 2018-09-29 DIAGNOSIS — G4733 Obstructive sleep apnea (adult) (pediatric): Secondary | ICD-10-CM

## 2018-09-29 NOTE — Telephone Encounter (Signed)
PSG 09/09/18 >> AHI 123.8, SpO2 low 65%.  CPAP 18 cm H2O with 1 liter oxygen >> AHI 11.8.  Trach capped during study.   Please let him know his sleep study showed severe obstructive sleep apnea.  He did well with CPAP 18 cm H2O and 1 liter oxygen at night.  Please arrange for CPAP 18 cm H2O with heated humidity and mask of choice.  He will also need to arrange for 1 liter oxygen added into CPAP at night.  He should wear CPAP nightly with tracheostomy capped.  He needs ROV with me or NP in 2 months after getting CPAP and nocturnal oxygen set up.

## 2018-09-29 NOTE — Procedures (Signed)
Patient Name: Derrick [name], Derrick [name] Date: 09/09/2018 Gender: Male D.O.B: 12/26/1980 Age (years): 38 Referring Provider: Chesley Mires MD, ABSM Height (inches): 70 Interpreting Physician: Chesley Mires MD, ABSM Weight (lbs): 370 RPSGT: Carolin Coy BMI: 71 MRN: [id number] Neck Size: 18.50 <br> <br> CLINICAL INFORMATION The patient is a 38 year old with obstructive sleep apnea, obesity hypoventilation syndrome, and chronic hypoxic/hypercapnic respiratory failure.  He has chronic tracheostomy.  He recently lost a significant amount of weight.  He presents to the sleep lab to assess current status of sleep disordered breathing, and determine whether he could transition to PAP therapy.  The study was conducted with his tracheostomy capped. MEDICATIONS Medications self-administered by patient taken the night of the study : GABAPENTIN, METOPROLOL, LASIX, MUCINEX, MONTELUKAST  SLEEP STUDY TECHNIQUE As per the AASM Manual for the Scoring of Sleep and Associated Events v2.3 (April 2016) with a hypopnea requiring 4% desaturations.  The channels recorded and monitored were frontal, central and occipital EEG, electrooculogram (EOG), submentalis EMG (chin), nasal and oral airflow, thoracic and abdominal wall motion, anterior tibialis EMG, snore microphone, electrocardiogram, and pulse oximetry. Bi-level positive airway pressure (BiPAP) was initiated when the patient met split night criteria and was titrated according to treat sleep-disordered breathing.  RESPIRATORY PARAMETERS Diagnostic  Total AHI (/hr): 123.8 RDI (/hr): 126.4 OA Index (/hr): 91.6 CA Index (/hr): 0.4 REM AHI (/hr): N/A NREM AHI (/hr): 123.8 Supine AHI (/hr): 128.3 Non-supine AHI (/hr): 124.47 Min O2 Sat (%): 65.00 Mean O2 (%): 84.56 Time below 88% (min): 84.1   Titration  Optimal IPAP Pressure (cm): 18 Optimal EPAP Pressure (cm): 18 AHI at Optimal Pressure (/hr): 11.8 Min O2 at Optimal Pressure (%): 85.0 Sleep % at  Optimal (%): 100 Supine % at Optimal (%): 100      He had continued oxygen desaturation in the absence of other respiratory events, and this last for more than 5 minutes.  This improved after the addition of 1 liter supplemental oxygen with CPAP 18 cm H2O. SLEEP ARCHITECTURE The study was initiated at 11:44:47 PM and terminated at 5:47:35 AM. The total recorded time was 362.8 minutes. EEG confirmed total sleep time was 320.8 minutes yielding a sleep efficiency of 88.4%. Sleep onset after lights out was 0.0 minutes with a REM latency of 276.1 minutes. The patient spent 24.04% of the night in stage N1 sleep, 66.92% in stage N2 sleep, 0.00% in stage N3 and 9% in REM. Wake after sleep onset (WASO) was 42.0 minutes. The Arousal Index was 80.2/hour.  LEG MOVEMENT DATA The total Periodic Limb Movements of Sleep (PLMS) were 0. The PLMS index was 0.00 .  CARDIAC DATA The 2 lead EKG demonstrated sinus rhythm. The mean heart rate was 84.43 beats per minute. Other EKG findings include: None. IMPRESSIONS - Severe obstructive sleep apnea with an AHI of 123.8 and SpO2 low of 65%. - He did well with CPAP 18 cm H2O. - He required the addition of 1 liter oxygen with CPAP. DIAGNOSIS - Obstructive Sleep Apnea (327.23 [G47.33 ICD-10]) RECOMMENDATIONS - Trial of CPAP therapy at 18 cm H2O with 1 liter supplemental oxygen added to CPAP. - He was fitted with a Medium size Resmed Full Face Mask AirFit F10 mask and heated humidification. - If he does well with CPAP and supplemental oxygen at home, then he could be a candidate for tracheostomy removal. - Continued weight reduction.  [Electronically signed] 09/29/2018 02:44 PM  Chesley Mires MD, ABSM Diplomate, American Board of Sleep Medicine  NPI: [id number]

## 2018-10-01 ENCOUNTER — Telehealth: Payer: Self-pay | Admitting: Pulmonary Disease

## 2018-10-01 DIAGNOSIS — G4733 Obstructive sleep apnea (adult) (pediatric): Secondary | ICD-10-CM

## 2018-10-01 NOTE — Telephone Encounter (Signed)
Called and spoke with Patient.  Dr Craige Cotta recommendations given.  Understanding stated.  Patient scheduled 12/02/18, at 0945, with Dr Craige Cotta.  DME cpap order placed. Nothing further at this time.   Per Dr Craige Cotta-  PSG 09/09/18 >> AHI 123.8, SpO2 low 65%.  CPAP 18 cm H2O with 1 liter oxygen >> AHI 11.8.  Trach capped during study.   Please let him know his sleep study showed severe obstructive sleep apnea.  He did well with CPAP 18 cm H2O and 1 liter oxygen at night.  Please arrange for CPAP 18 cm H2O with heated humidity and mask of choice.  He will also need to arrange for 1 liter oxygen added into CPAP at night.  He should wear CPAP nightly with tracheostomy capped.  He needs ROV with me or NP in 2 months after getting CPAP and nocturnal oxygen set up.

## 2018-10-01 NOTE — Telephone Encounter (Signed)
Called and spoke with Patient.  Dr Sood recommendations given.  Understanding stated.  Patient scheduled 12/02/18, at 0945, with Dr Sood.  DME cpap order placed. Nothing further at this time.   Per Dr Sood-  PSG 09/09/18 >> AHI 123.8, SpO2 low 65%.  CPAP 18 cm H2O with 1 liter oxygen >> AHI 11.8.  Trach capped during study.   Please let him know his sleep study showed severe obstructive sleep apnea.  He did well with CPAP 18 cm H2O and 1 liter oxygen at night.  Please arrange for CPAP 18 cm H2O with heated humidity and mask of choice.  He will also need to arrange for 1 liter oxygen added into CPAP at night.  He should wear CPAP nightly with tracheostomy capped.  He needs ROV with me or NP in 2 months after getting CPAP and nocturnal oxygen set up. 

## 2018-10-03 ENCOUNTER — Ambulatory Visit (HOSPITAL_COMMUNITY)
Admission: RE | Admit: 2018-10-03 | Discharge: 2018-10-03 | Disposition: A | Discharge status: Home / Self Care | Payer: BLUE CROSS/BLUE SHIELD | Source: Ambulatory Visit | Attending: Acute Care | Admitting: Acute Care

## 2018-10-03 DIAGNOSIS — Z430 Encounter for attention to tracheostomy: Secondary | ICD-10-CM | POA: Insufficient documentation

## 2018-10-03 NOTE — Progress Notes (Addendum)
Tracheostomy Procedure Note  Derrick [name] [id number] 09/28/1980  Pre Procedure Tracheostomy Information  Trach Brand: Bivona Size: 6.0 Style: Uncuffed Secured by: Velcro   Procedure: Trach change and trach cleaning done    Post Procedure Tracheostomy Information  Trach Brand: Bivona Size: 6.0 Style: Uncuffed Secured by: Velcro   Post Procedure Evaluation:  ETCO2 positive color change from yellow to purple : Yes.   Vital signs:blood pressure , pulse  respirations  and pulse oximetry 96 % Patients current condition: stable Complications: No apparent complications Trach site exam: clean Wound care done: dry Patient did tolerate procedure well.   Education: none  Prescription needs: none    Additional needs: none

## 2018-10-14 ENCOUNTER — Other Ambulatory Visit: Payer: Self-pay | Admitting: Internal Medicine

## 2018-10-29 ENCOUNTER — Encounter: Payer: Self-pay | Admitting: Internal Medicine

## 2018-10-29 ENCOUNTER — Ambulatory Visit (INDEPENDENT_AMBULATORY_CARE_PROVIDER_SITE_OTHER): Payer: BLUE CROSS/BLUE SHIELD | Admitting: Internal Medicine

## 2018-10-29 DIAGNOSIS — J3089 Other allergic rhinitis: Secondary | ICD-10-CM

## 2018-10-29 DIAGNOSIS — I5032 Chronic diastolic (congestive) heart failure: Secondary | ICD-10-CM

## 2018-10-29 MED ORDER — LEVOCETIRIZINE DIHYDROCHLORIDE 5 MG PO TABS: 5.0000 mg | ORAL_TABLET | Freq: Every evening | ORAL | 1 refills | Status: DC

## 2018-10-29 MED ORDER — FLUTICASONE PROPIONATE 50 MCG/ACT NA SUSP: 2.0000 | Freq: Every day | NASAL | 6 refills | Status: DC

## 2018-10-29 NOTE — Assessment & Plan Note (Signed)
Patient requests scale rx to weight daily. [facility]. Advised patient it may not be covered by insurance.

## 2018-10-29 NOTE — Assessment & Plan Note (Signed)
Change zyrtec to xyzal and add flonase. Continue singulair. No indication for antibiotics or steroids today. Encouraged to use saline rinses as well.

## 2018-10-29 NOTE — Progress Notes (Signed)
Virtual Visit via Video Note  I connected with Derrick [name] on 10/29/18 at  9:40 AM EDT by a video enabled telemedicine application and verified that I am speaking with the correct person using two identifiers.   I discussed the limitations of evaluation and management by telemedicine and the availability of in person appointments. The patient expressed understanding and agreed to proceed.  History of Present Illness: The patient is a 38 y.o. YO man with visit for 5 days of sinus symptoms. He is having nose drainage and congestion. Mild aches and chills at the onset. Denies headaches. Started 5 days ago. Has sinus drainage and pressure, chronic cough which is unchanged. Denies SOB or change in activity. Overall it is stable since onset. Has tried zyrtec and singulair but is not taking his flovent daily.   Observations/Objective: Appearance: just woke up, breathing appears normal, casual grooming, abdomen does not appear distended, throat not examined, mental status is A and O times 3  Assessment and Plan: See problem oriented charting  Follow Up Instructions: rx for flonase and xyzal  I discussed the assessment and treatment plan with the patient. The patient was provided an opportunity to ask questions and all were answered. The patient agreed with the plan and demonstrated an understanding of the instructions.   The patient was advised to call back or seek an in-person evaluation if the symptoms worsen or if the condition fails to improve as anticipated.  Myrlene Broker, MD

## 2018-10-31 ENCOUNTER — Ambulatory Visit (HOSPITAL_COMMUNITY): Payer: BLUE CROSS/BLUE SHIELD

## 2018-11-26 ENCOUNTER — Other Ambulatory Visit: Payer: Self-pay | Admitting: Internal Medicine

## 2018-11-26 DIAGNOSIS — E118 Type 2 diabetes mellitus with unspecified complications: Secondary | ICD-10-CM

## 2018-12-02 ENCOUNTER — Ambulatory Visit: Payer: BLUE CROSS/BLUE SHIELD | Admitting: Pulmonary Disease

## 2018-12-27 ENCOUNTER — Encounter: Payer: Self-pay | Admitting: Internal Medicine

## 2018-12-27 ENCOUNTER — Other Ambulatory Visit: Payer: Self-pay

## 2018-12-27 ENCOUNTER — Other Ambulatory Visit (INDEPENDENT_AMBULATORY_CARE_PROVIDER_SITE_OTHER): Payer: BLUE CROSS/BLUE SHIELD

## 2018-12-27 ENCOUNTER — Ambulatory Visit (INDEPENDENT_AMBULATORY_CARE_PROVIDER_SITE_OTHER)
Admission: RE | Admit: 2018-12-27 | Discharge: 2018-12-27 | Disposition: A | Discharge status: Home / Self Care | Payer: BLUE CROSS/BLUE SHIELD | Source: Ambulatory Visit | Attending: Internal Medicine | Admitting: Internal Medicine

## 2018-12-27 ENCOUNTER — Ambulatory Visit (INDEPENDENT_AMBULATORY_CARE_PROVIDER_SITE_OTHER): Payer: BLUE CROSS/BLUE SHIELD | Admitting: Internal Medicine

## 2018-12-27 VITALS — BP 132/80 | HR 80 | Temp 98.1°F | Ht 70.0 in | Wt >= 6400 oz

## 2018-12-27 DIAGNOSIS — N528 Other male erectile dysfunction: Secondary | ICD-10-CM | POA: Diagnosis not present

## 2018-12-27 DIAGNOSIS — I5032 Chronic diastolic (congestive) heart failure: Secondary | ICD-10-CM

## 2018-12-27 DIAGNOSIS — G4733 Obstructive sleep apnea (adult) (pediatric): Secondary | ICD-10-CM

## 2018-12-27 DIAGNOSIS — L709 Acne, unspecified: Secondary | ICD-10-CM

## 2018-12-27 DIAGNOSIS — E118 Type 2 diabetes mellitus with unspecified complications: Secondary | ICD-10-CM

## 2018-12-27 LAB — BRAIN NATRIURETIC PEPTIDE: Pro B Natriuretic peptide (BNP): 17 pg/mL (ref 0.0–100.0)

## 2018-12-27 LAB — CBC
HCT: 37.6 % — ABNORMAL LOW (ref 39.0–52.0)
Hemoglobin: 12.7 g/dL — ABNORMAL LOW (ref 13.0–17.0)
MCHC: 33.7 g/dL (ref 30.0–36.0)
MCV: 88.8 fl (ref 78.0–100.0)
Platelets: 214 10*3/uL (ref 150.0–400.0)
RBC: 4.23 Mil/uL (ref 4.22–5.81)
RDW: 14.7 % (ref 11.5–15.5)
WBC: 9.4 10*3/uL (ref 4.0–10.5)

## 2018-12-27 LAB — HEMOGLOBIN A1C: Hgb A1c MFr Bld: 7.2 % — ABNORMAL HIGH (ref 4.6–6.5)

## 2018-12-27 LAB — COMPREHENSIVE METABOLIC PANEL
ALT: 19 U/L (ref 0–53)
AST: 16 U/L (ref 0–37)
Albumin: 4.1 g/dL (ref 3.5–5.2)
Alkaline Phosphatase: 112 U/L (ref 39–117)
BUN: 10 mg/dL (ref 6–23)
CO2: 36 mEq/L — ABNORMAL HIGH (ref 19–32)
Calcium: 9.4 mg/dL (ref 8.4–10.5)
Chloride: 99 mEq/L (ref 96–112)
Creatinine, Ser: 0.92 mg/dL (ref 0.40–1.50)
GFR: 112.27 mL/min (ref >60.00)
Glucose, Bld: 117 mg/dL — ABNORMAL HIGH (ref 70–99)
Potassium: 3.4 mEq/L — ABNORMAL LOW (ref 3.5–5.1)
Sodium: 142 mEq/L (ref 135–145)
Total Bilirubin: 0.7 mg/dL (ref 0.2–1.2)
Total Protein: 7.8 g/dL (ref 6.0–8.3)

## 2018-12-27 LAB — MAGNESIUM: Magnesium: 1.8 mg/dL (ref 1.5–2.5)

## 2018-12-27 LAB — LIPID PANEL
Cholesterol: 136 mg/dL (ref 0–200)
HDL: 42.5 mg/dL (ref >39.00)
LDL Cholesterol: 74 mg/dL (ref 0–99)
NonHDL: 93.22
Total CHOL/HDL Ratio: 3
Triglycerides: 98 mg/dL (ref 0.0–149.0)
VLDL: 19.6 mg/dL (ref 0.0–40.0)

## 2018-12-27 LAB — MICROALBUMIN / CREATININE URINE RATIO
Creatinine,U: 175.2 mg/dL
Microalb Creat Ratio: 0.4 mg/g (ref 0.0–30.0)
Microalb, Ur: <0.7 mg/dL (ref 0.0–1.9)

## 2018-12-27 NOTE — Assessment & Plan Note (Signed)
Needs HgA1c given weight gain and adjust regimen if needed. This is complicated by OSA, cDHF, neuropathy.

## 2018-12-27 NOTE — Assessment & Plan Note (Addendum)
Needs BNP, CMP, magnesium and adjust as needed. Increase lasix to 6 pills daily for fluid. He is wearing compression stockings and talked to him about getting a new pair as these may not be compressing adequately at this time. Needs CXR as well given exam is limited by habitus.

## 2018-12-27 NOTE — Patient Instructions (Signed)
We will get you in with the cardiologist and the urologist and the skin doctor.   We will do the labs and the x-ray today.   Increase the lasix to 6 pills per day for the next week and let us know if the weight is not decreasing.

## 2018-12-27 NOTE — Assessment & Plan Note (Signed)
Has not made transition to CPAP with 1 L O2 at this time.

## 2018-12-27 NOTE — Assessment & Plan Note (Signed)
Referral to urology for persistent ED despite trying cialis and viagra without success.

## 2018-12-27 NOTE — Assessment & Plan Note (Signed)
Referral to dermatology.  

## 2018-12-27 NOTE — Progress Notes (Signed)
   Subjective:   Patient ID: Derrick [name], male    DOB: 12/28/1980, 38 y.o.   MRN: [id number]  HPI The patient is a 38 YO man with complicated PMH coming in for bilateral leg swelling. He does have past history of diastolic heart failure grade 2 on echo 2016. He does take lasix 4 pills daily and has not missed lately. He has had some weight gain in last several weeks. Is taking lasix 4 pills daily and denies missing doses. He has had some dietary changes since the pandemic and is struggling to maintain weight. Weight was 370 about 3 months ago at another office visit. Has done sleep study with pulmonary and they may move him to CPAP with supplemental oxygen at night time instead of trach. He has not done this given pandemic.   PMH, [facility], social history reviewed and updated  Review of Systems  Constitutional: Positive for activity change, appetite change and unexpected weight change.  HENT: Negative.   Eyes: Negative.   Respiratory: Positive for shortness of breath. Negative for cough and chest tightness.   Cardiovascular: Positive for leg swelling. Negative for chest pain and palpitations.  Gastrointestinal: Negative for abdominal distention, abdominal pain, constipation, diarrhea, nausea and vomiting.  Musculoskeletal: Negative.   Skin: Negative.   Neurological: Negative.   Psychiatric/Behavioral: Negative.     Objective:  Physical Exam Constitutional:      Appearance: He is well-developed. He is obese.     Comments: Morbidly obese  HENT:     Head: Normocephalic and atraumatic.  Neck:     Musculoskeletal: Normal range of motion.  Cardiovascular:     Rate and Rhythm: Normal rate and regular rhythm.  Pulmonary:     Effort: Pulmonary effort is normal. No respiratory distress.     Breath sounds: Normal breath sounds. No wheezing or rales.     Comments: Trach, covered, habitus limits exam Abdominal:     General: Bowel sounds are normal. There is no distension.   Palpations: Abdomen is soft.     Tenderness: There is no abdominal tenderness. There is no rebound.  Musculoskeletal:     Right lower leg: Edema present.     Left lower leg: Edema present.     Comments: Left leg with firm edema 2+ and right leg with 2+ but less firm, no weeping  Skin:    General: Skin is warm and dry.  Neurological:     Mental Status: He is alert and oriented to person, place, and time.     Coordination: Coordination normal.     Vitals:   12/27/18 1347  BP: 132/80  Pulse: 80  Temp: 98.1 F (36.7 C)  TempSrc: Oral  SpO2: 96%  Weight: (!) 415 lb (188.2 kg)  Height: 5\' 10"  (1.778 m)   Visit time 40 minutes: greater than 50% of that time was spent in face to face counseling and coordination of care with the patient: counseled about his diabetes as well as current symptoms and heart health as well as his OSA and trach see a/p for full details  Assessment & Plan:

## 2018-12-29 ENCOUNTER — Other Ambulatory Visit: Payer: Self-pay | Admitting: Internal Medicine

## 2018-12-29 DIAGNOSIS — E118 Type 2 diabetes mellitus with unspecified complications: Secondary | ICD-10-CM

## 2019-01-02 ENCOUNTER — Other Ambulatory Visit: Payer: Self-pay | Admitting: Internal Medicine

## 2019-01-14 ENCOUNTER — Other Ambulatory Visit: Payer: Self-pay | Admitting: Internal Medicine

## 2019-01-22 ENCOUNTER — Other Ambulatory Visit: Payer: Self-pay | Admitting: Internal Medicine

## 2019-01-23 ENCOUNTER — Other Ambulatory Visit: Payer: Self-pay | Admitting: Acute Care

## 2019-01-23 DIAGNOSIS — Z930 Tracheostomy status: Secondary | ICD-10-CM

## 2019-02-06 ENCOUNTER — Inpatient Hospital Stay (HOSPITAL_COMMUNITY): Admission: RE | Admit: 2019-02-06 | Payer: BLUE CROSS/BLUE SHIELD | Source: Ambulatory Visit

## 2019-02-07 ENCOUNTER — Telehealth: Payer: Self-pay

## 2019-02-07 ENCOUNTER — Ambulatory Visit: Payer: Self-pay | Admitting: *Deleted

## 2019-02-07 NOTE — Telephone Encounter (Signed)
Copied from [place] [id number]. Topic: Referral - Question >> Feb 07, 2019  1:41 PM Sheran Luz wrote: Patient calling to inquire if referral for derm can be sent to somewhere in network with [facility] ( [place])

## 2019-02-07 NOTE — Telephone Encounter (Signed)
Noted thanks °

## 2019-02-07 NOTE — Telephone Encounter (Signed)
Summary: cough    Patient calling requesting call back from [facility] to discuss "dry cough     Returned call to patient, regarding the cough. He has had the cough and is wanting to know if his pcp can prescribe him a liquid medication that he can take for it. He denies fever, shortness of breath. He has a hx of having a trach since 2016. He uses a CPAP at night, Advised of home care of taking honey, fluids and hot tea and lemon. He stated that he will be getting his trach exchanged out next Thurs and will have the covid-19 test done before that. He asked me to hold on but he disconnected the call. Will route to the provider at [facility] for review and recommendation.  Reason for Disposition . Cough  Answer Assessment - Initial Assessment Questions 1. ONSET: "When did the cough begin?"      A week 2. SEVERITY: "How bad is the cough today?"      Not bad right now 3. RESPIRATORY DISTRESS: "Describe your breathing."      Sounds good right now and pt states is it fine 4. FEVER: "Do you have a fever?" If so, ask: "What is your temperature, how was it measured, and when did it start?"     no 5. HEMOPTYSIS: "Are you coughing up any blood?" If so ask: "How much?" (flecks, streaks, tablespoons, etc.)     no 6. TREATMENT: "What have you done so far to treat the cough?" (e.g., meds, fluids, humidifier)     Water,  7. CARDIAC HISTORY: "Do you have any history of heart disease?" (e.g., heart attack, congestive heart failure)      no 8. LUNG HISTORY: "Do you have any history of lung disease?"  (e.g., pulmonary embolus, asthma, emphysema)     Yes, collapsed lung before he had the trach put it 9. PE RISK FACTORS: "Do you have a history of blood clots?" (or: recent major surgery, recent prolonged travel, bedridden)     no 10. OTHER SYMPTOMS: "Do you have any other symptoms? (e.g., runny nose, wheezing, chest pain)       no 11. PREGNANCY: "Is there any chance you are pregnant?" "When was your last  menstrual period?"       no 12. TRAVEL: "Have you traveled out of the country in the last month?" (e.g., travel history, exposures)       no  Protocols used: COUGH - ACUTE NON-PRODUCTIVE-A-AH

## 2019-02-07 NOTE — Telephone Encounter (Signed)
Can take mucinex over the counter for this. If different than usual cough can offer virtual visit.

## 2019-02-07 NOTE — Telephone Encounter (Signed)
LVM with MD response  

## 2019-02-07 NOTE — Telephone Encounter (Signed)
We do typically know if the specialists take their insurance or not. However, we are unable to see what doctors are in pts insurance network and with the [place], we don't usually get those because they typically don't come to our office since we are not in network.  I will be sure to send his referrals to [facility]

## 2019-02-07 NOTE — Telephone Encounter (Signed)
When doing referrals making sure covered through there insurance is something that is always done right?

## 2019-02-10 ENCOUNTER — Inpatient Hospital Stay (HOSPITAL_COMMUNITY): Admission: RE | Admit: 2019-02-10 | Payer: BLUE CROSS/BLUE SHIELD | Source: Ambulatory Visit

## 2019-02-11 ENCOUNTER — Other Ambulatory Visit (HOSPITAL_COMMUNITY)
Admission: RE | Admit: 2019-02-11 | Discharge: 2019-02-11 | Disposition: A | Discharge status: Home / Self Care | Payer: BLUE CROSS/BLUE SHIELD | Source: Ambulatory Visit | Attending: Acute Care | Admitting: Acute Care

## 2019-02-11 ENCOUNTER — Other Ambulatory Visit: Payer: Self-pay | Admitting: Internal Medicine

## 2019-02-11 DIAGNOSIS — Z1159 Encounter for screening for other viral diseases: Secondary | ICD-10-CM | POA: Insufficient documentation

## 2019-02-11 NOTE — Progress Notes (Signed)
Left voice message for Derrick [name] to inquire of his arrival today for Covid 19 testing prior to procedure on 7/16.

## 2019-02-12 ENCOUNTER — Encounter: Payer: Self-pay | Admitting: Internal Medicine

## 2019-02-12 LAB — SARS CORONAVIRUS 2 (TAT 6-24 HRS): SARS Coronavirus 2: NEGATIVE

## 2019-02-13 ENCOUNTER — Ambulatory Visit (HOSPITAL_COMMUNITY)
Admission: RE | Admit: 2019-02-13 | Discharge: 2019-02-13 | Disposition: A | Discharge status: Home / Self Care | Payer: BLUE CROSS/BLUE SHIELD | Source: Ambulatory Visit | Attending: Acute Care | Admitting: Acute Care

## 2019-02-13 ENCOUNTER — Other Ambulatory Visit: Payer: Self-pay

## 2019-02-13 DIAGNOSIS — G4733 Obstructive sleep apnea (adult) (pediatric): Secondary | ICD-10-CM | POA: Diagnosis not present

## 2019-02-13 DIAGNOSIS — Z430 Encounter for attention to tracheostomy: Secondary | ICD-10-CM | POA: Diagnosis not present

## 2019-02-13 DIAGNOSIS — Z930 Tracheostomy status: Secondary | ICD-10-CM

## 2019-02-13 NOTE — Progress Notes (Signed)
Tracheostomy Procedure Note  Derrick [name] [id number] March 31, 1981  Pre Procedure Tracheostomy Information  Trach Brand:Bivona Portex Size: 6.0 Style: uncuffed Secured by: velcro   Procedure: trach change    Post Procedure Tracheostomy Information  Trach Brand: Bivona Portex Size: 6.0 Style: uncuffed Secured HX:[id number]   Post Procedure Evaluation:  ETCO2 positive color change from yellow to purple : yes Vital signs:blood pressure 141/79 pulse 78, respirations 24 and pulse oximetry  99% Patients current condition: stable Complications: no complications Trach site exam: healed Wound care done: gauze dressing  Patient did tolerate procedure well.   Education: none  Prescription needs:     Additional needs: Return 6 weeks for trach change

## 2019-02-13 NOTE — Progress Notes (Signed)
[facility]   Reason for visit:  Routine trach care  HPI:  Well known to me. Followed for trach management. Trach dependent in setting of OSA. Returns today for planned trach care. COVID-19 neg. No new complaints.  ROS  Review of Systems - History obtained from the patient General ROS: negative for - fatigue, fever, malaise, night sweats, sleep disturbance, weight gain or weight loss ENT ROS: negative for - headaches, hearing change, nasal congestion, nasal discharge, sneezing, sore throat or vocal changes Allergy and Immunology ROS: negative for - seasonal allergies Hematological and Lymphatic ROS: negative for - bleeding problems Endocrine ROS: negative for - hair pattern changes, temperature intolerance or unexpected weight changes Respiratory ROS: negative Cardiovascular ROS: negative Gastrointestinal ROS: no abdominal pain, change in bowel habits, or black or bloody stools Musculoskeletal ROS: negative Neurological ROS: no TIA or stroke symptoms  Vital signs:  reviewed Exam:  Physical Exam Vitals signs reviewed.  Constitutional:      General: He is not in acute distress.    Appearance: Normal appearance. He is obese. He is not ill-appearing, toxic-appearing or diaphoretic.  HENT:     Head: Normocephalic and atraumatic.     Nose: Nose normal. No congestion or rhinorrhea.  Eyes:     General:        Right eye: No discharge.     Pupils: Pupils are equal, round, and reactive to light.  Neck:     Musculoskeletal: Normal range of motion.     Comments: # 6 trach changed w/out difficulty  Neurological:     Mental Status: He is alert.     Trach change/procedure: 6 portex trach removed w/out difficulty. The stoma is unremarkable. # 6 trach replaced w/out difficulty       Impression/dx  OSA  Trach dependent  Discussion  Derrick [name] is doing well. Now has CPAP at home and sleeping w/ trach capped and feels as he is doing well.  Plan  Continue routine  trach care ROV 6 weeks He is ready for decannulation but would like to wait a little still to ensure that he is tolerating his CPAP well.  I would also like to get Dr Juanetta Gosling opinion on this in regards to him being ready for decannulation  Erick Colace ACNP-BC [place] Pager # [phone] OR # [phone] if no answer

## 2019-03-04 ENCOUNTER — Other Ambulatory Visit: Payer: Self-pay | Admitting: Internal Medicine

## 2019-03-04 DIAGNOSIS — E118 Type 2 diabetes mellitus with unspecified complications: Secondary | ICD-10-CM

## 2019-03-12 ENCOUNTER — Other Ambulatory Visit: Payer: Self-pay | Admitting: Internal Medicine

## 2019-03-31 ENCOUNTER — Ambulatory Visit: Payer: BLUE CROSS/BLUE SHIELD | Admitting: Internal Medicine

## 2019-04-01 ENCOUNTER — Encounter: Payer: Self-pay | Admitting: Internal Medicine

## 2019-04-01 ENCOUNTER — Ambulatory Visit (INDEPENDENT_AMBULATORY_CARE_PROVIDER_SITE_OTHER): Payer: BLUE CROSS/BLUE SHIELD | Admitting: Internal Medicine

## 2019-04-01 ENCOUNTER — Other Ambulatory Visit: Payer: Self-pay

## 2019-04-01 ENCOUNTER — Ambulatory Visit: Payer: Self-pay | Admitting: *Deleted

## 2019-04-01 ENCOUNTER — Other Ambulatory Visit (INDEPENDENT_AMBULATORY_CARE_PROVIDER_SITE_OTHER): Payer: BLUE CROSS/BLUE SHIELD

## 2019-04-01 VITALS — BP 130/86 | HR 87 | Temp 99.0°F | Ht 70.0 in | Wt >= 6400 oz

## 2019-04-01 DIAGNOSIS — M79605 Pain in left leg: Secondary | ICD-10-CM | POA: Diagnosis not present

## 2019-04-01 DIAGNOSIS — E118 Type 2 diabetes mellitus with unspecified complications: Secondary | ICD-10-CM

## 2019-04-01 LAB — CBC
HCT: 36.1 % — ABNORMAL LOW (ref 39.0–52.0)
Hemoglobin: 11.7 g/dL — ABNORMAL LOW (ref 13.0–17.0)
MCHC: 32.5 g/dL (ref 30.0–36.0)
MCV: 90.2 fl (ref 78.0–100.0)
Platelets: 189 10*3/uL (ref 150.0–400.0)
RBC: 4 Mil/uL — ABNORMAL LOW (ref 4.22–5.81)
RDW: 14.7 % (ref 11.5–15.5)
WBC: 8.5 10*3/uL (ref 4.0–10.5)

## 2019-04-01 LAB — HEMOGLOBIN A1C: Hgb A1c MFr Bld: 7 % — ABNORMAL HIGH (ref 4.6–6.5)

## 2019-04-01 MED ORDER — FUROSEMIDE 80 MG PO TABS: 80.0000 mg | ORAL_TABLET | Freq: Four times a day (QID) | ORAL | 6 refills | Status: DC

## 2019-04-01 MED ORDER — SULFAMETHOXAZOLE-TRIMETHOPRIM 800-160 MG PO TABS: 1.0000 | ORAL_TABLET | Freq: Two times a day (BID) | ORAL | 0 refills | Status: DC

## 2019-04-01 NOTE — Addendum Note (Signed)
Addended by: Pricilla Holm A on: 04/01/2019 02:41 PM   Modules accepted: Orders

## 2019-04-01 NOTE — Patient Instructions (Addendum)
We will send in the bactrim to take 1 pill twice a day for 1 week.   We will also get an ultrasound of the leg to check for blood clots in case the antibiotic does not work.   We will check the blood work today.

## 2019-04-01 NOTE — Telephone Encounter (Signed)
  Pt had an appt yesterday with Dr. Sharlet Salina for swelling and pain in his left calf but he could not make it in so he cancelled. He is calling back this morning to reschedule however he was sent to the triage pool to be triaged due to his symptoms. He is c/o swelling, pain, redness and a knot in the back of his left calf that started yesterday.  I warm transferred his call into Dr. Nathanial Millman office to Lovena Le who took the call.  I forwarded my triage notes to the office.  Reason for Disposition . [1] Thigh, calf, or ankle swelling AND [2] only 1 side  Answer Assessment - Initial Assessment Questions 1. ONSET: "When did the swelling start?" (e.g., minutes, hours, days)     Started Sunday my left leg.   2. LOCATION: "What part of the leg is swollen?"  "Are both legs swollen or just one leg?"     Just the back of my thigh and my calf is swollen.  It\'s hurting and feeling warmer.   I have a knot in my calf on the left. 3. SEVERITY: "How bad is the swelling?" (e.g., localized; mild, moderate, severe)  - Localized - small area of swelling localized to one leg  - MILD pedal edema - swelling limited to foot and ankle, pitting edema < 1/4 inch (6 mm) deep, rest and elevation eliminate most or all swelling  - MODERATE edema - swelling of lower leg to knee, pitting edema > 1/4 inch (6 mm) deep, rest and elevation only partially reduce swelling  - SEVERE edema - swelling extends above knee, facial or hand swelling present      Moderaate 4. REDNESS: "Does the swelling look red or infected?"     Red 5. PAIN: "Is the swelling painful to touch?" If so, ask: "How painful is it?"   (Scale 1-10; mild, moderate or severe)     Painful to touch also. 6. FEVER: "Do you have a fever?" If so, ask: "What is it, how was it measured, and when did it start?"      One day I had a fever on Sunday it was 99 7. CAUSE: "What do you think is causing the leg swelling?"     20 11 I had cellulitis in my other leg.   I just  wonder if this is that.   No blood clot history and not on blood thinners. 8. MEDICAL HISTORY: "Do you have a history of heart failure, kidney disease, liver failure, or cancer?"     The papers say I had CHF.    2016 I was in the hospital for CHF and pneumonia. 9. RECURRENT SYMPTOM: "Have you had leg swelling before?" If so, ask: "When was the last time?" "What happened that time?"     My leg swells at times.   Dr. Sharlet Salina is aware of it.   I wear compression socks.    My left leg swells up on occasion.   I fell and fractured this leg a long time ago.    10. OTHER SYMPTOMS: "Do you have any other symptoms?" (e.g., chest pain, difficulty breathing)       No 11. PREGNANCY: "Is there any chance you are pregnant?" "When was your last menstrual period?"       N/A  Protocols used: LEG SWELLING AND EDEMA-A-AH

## 2019-04-01 NOTE — Progress Notes (Signed)
   Subjective:   Patient ID: Derrick [name], male    DOB: [birth year], 38 y.o.   MRN: [id number]  HPI The patient is a 38 YO man coming in for several concerns about leg pain and swelling (left leg above the knee posterior, with pain, some redness on the skin, denies fevers or chills, has had cellulitis in the past, feels he is some more swollen, has taken some more lasix which helped slightly) and diabetes (needs follow up labs, denies new numbness or weakness, denies open wound on foot) and morbid obesity (weight is up above 3 pounds since May, he is considering weight loss surgery hopefully this year or early next year, has tried many diets over the years and is able to gain weight, he always gains the weight back).   Review of Systems  Constitutional: Positive for activity change.  HENT: Negative.   Eyes: Negative.   Respiratory: Negative for cough, chest tightness and shortness of breath.   Cardiovascular: Positive for leg swelling. Negative for chest pain and palpitations.  Gastrointestinal: Negative for abdominal distention, abdominal pain, constipation, diarrhea, nausea and vomiting.  Musculoskeletal: Positive for myalgias.  Skin: Negative.   Neurological: Negative.   Psychiatric/Behavioral: Negative.     Objective:  Physical Exam Constitutional:      Appearance: He is well-developed. He is obese.  HENT:     Head: Normocephalic and atraumatic.  Neck:     Musculoskeletal: Normal range of motion.  Cardiovascular:     Rate and Rhythm: Normal rate and regular rhythm.  Pulmonary:     Effort: Pulmonary effort is normal. No respiratory distress.     Breath sounds: Normal breath sounds. No wheezing or rales.     Comments: Lurline Idol not examined today Abdominal:     General: Bowel sounds are normal. There is no distension.     Palpations: Abdomen is soft.     Tenderness: There is no abdominal tenderness. There is no rebound.  Musculoskeletal:     Left lower leg: Edema present.      Comments: Edema above the left knee posterior with woody feel, with redness on the skin, no drainage or breakdown  Skin:    General: Skin is warm and dry.  Neurological:     Mental Status: He is alert and oriented to person, place, and time.     Coordination: Coordination normal.     Vitals:   04/01/19 1310  BP: 130/86  Pulse: 87  Temp: 99 F (37.2 C)  TempSrc: Oral  SpO2: 95%  Weight: (!) 418 lb (189.6 kg)  Height: 5\' 10"  (1.778 m)    Assessment & Plan:

## 2019-04-01 NOTE — Assessment & Plan Note (Signed)
Foot exam done, checking HgA1c and lipid panel and CMP. Adjust glipizide and januvia and actos as needed.

## 2019-04-01 NOTE — Assessment & Plan Note (Signed)
Suspect some of the swelling is due to morbid obesity although there does appear to be superimposed on cellulitis today. Rx bactrim 1 week course. Will increase lasix temporarily to see if we can reduce the swelling.

## 2019-04-01 NOTE — Assessment & Plan Note (Signed)
Complicated by tracheostomy for severe OSA, and diabetes and hypertension and pulmonary hypertension and dCHF.

## 2019-04-01 NOTE — Telephone Encounter (Signed)
Noted  

## 2019-04-02 ENCOUNTER — Ambulatory Visit (HOSPITAL_COMMUNITY)
Admission: RE | Admit: 2019-04-02 | Payer: BLUE CROSS/BLUE SHIELD | Source: Ambulatory Visit | Attending: Internal Medicine | Admitting: Internal Medicine

## 2019-04-02 LAB — COMPREHENSIVE METABOLIC PANEL
ALT: 19 U/L (ref 0–53)
AST: 21 U/L (ref 0–37)
Albumin: 3.8 g/dL (ref 3.5–5.2)
Alkaline Phosphatase: 100 U/L (ref 39–117)
BUN: 12 mg/dL (ref 6–23)
CO2: 33 mEq/L — ABNORMAL HIGH (ref 19–32)
Calcium: 9.1 mg/dL (ref 8.4–10.5)
Chloride: 98 mEq/L (ref 96–112)
Creatinine, Ser: 1.07 mg/dL (ref 0.40–1.50)
GFR: 94.18 mL/min (ref >60.00)
Glucose, Bld: 142 mg/dL — ABNORMAL HIGH (ref 70–99)
Potassium: 3.2 mEq/L — ABNORMAL LOW (ref 3.5–5.1)
Sodium: 140 mEq/L (ref 135–145)
Total Bilirubin: 0.6 mg/dL (ref 0.2–1.2)
Total Protein: 7.8 g/dL (ref 6.0–8.3)

## 2019-04-03 ENCOUNTER — Other Ambulatory Visit: Payer: Self-pay

## 2019-04-03 ENCOUNTER — Ambulatory Visit (HOSPITAL_COMMUNITY)
Admission: RE | Admit: 2019-04-03 | Discharge: 2019-04-03 | Disposition: A | Discharge status: Home / Self Care | Payer: BLUE CROSS/BLUE SHIELD | Source: Ambulatory Visit | Attending: Cardiovascular Disease | Admitting: Cardiovascular Disease

## 2019-04-03 ENCOUNTER — Other Ambulatory Visit: Payer: Self-pay | Admitting: Internal Medicine

## 2019-04-03 DIAGNOSIS — M79605 Pain in left leg: Secondary | ICD-10-CM | POA: Diagnosis not present

## 2019-04-03 MED ORDER — POTASSIUM CHLORIDE CRYS ER 20 MEQ PO TBCR: 20.0000 meq | EXTENDED_RELEASE_TABLET | Freq: Three times a day (TID) | ORAL | 3 refills | Status: DC

## 2019-04-09 ENCOUNTER — Other Ambulatory Visit: Payer: Self-pay | Admitting: Internal Medicine

## 2019-04-09 DIAGNOSIS — E118 Type 2 diabetes mellitus with unspecified complications: Secondary | ICD-10-CM

## 2019-04-18 ENCOUNTER — Other Ambulatory Visit: Payer: Self-pay | Admitting: Internal Medicine

## 2019-05-10 ENCOUNTER — Other Ambulatory Visit: Payer: Self-pay | Admitting: Internal Medicine

## 2019-05-10 DIAGNOSIS — E118 Type 2 diabetes mellitus with unspecified complications: Secondary | ICD-10-CM

## 2019-05-21 ENCOUNTER — Ambulatory Visit (HOSPITAL_COMMUNITY): Payer: BLUE CROSS/BLUE SHIELD

## 2019-05-30 ENCOUNTER — Ambulatory Visit (INDEPENDENT_AMBULATORY_CARE_PROVIDER_SITE_OTHER): Payer: BLUE CROSS/BLUE SHIELD | Admitting: Internal Medicine

## 2019-05-30 ENCOUNTER — Encounter: Payer: Self-pay | Admitting: Internal Medicine

## 2019-05-30 ENCOUNTER — Other Ambulatory Visit: Payer: Self-pay

## 2019-05-30 ENCOUNTER — Other Ambulatory Visit (INDEPENDENT_AMBULATORY_CARE_PROVIDER_SITE_OTHER): Payer: BLUE CROSS/BLUE SHIELD

## 2019-05-30 VITALS — BP 150/80 | HR 85 | Temp 98.3°F | Ht 70.0 in | Wt >= 6400 oz

## 2019-05-30 DIAGNOSIS — R05 Cough: Secondary | ICD-10-CM

## 2019-05-30 DIAGNOSIS — E118 Type 2 diabetes mellitus with unspecified complications: Secondary | ICD-10-CM | POA: Diagnosis not present

## 2019-05-30 DIAGNOSIS — I5032 Chronic diastolic (congestive) heart failure: Secondary | ICD-10-CM

## 2019-05-30 DIAGNOSIS — R059 Cough, unspecified: Secondary | ICD-10-CM

## 2019-05-30 LAB — CBC
HCT: 37.4 % — ABNORMAL LOW (ref 39.0–52.0)
Hemoglobin: 12.2 g/dL — ABNORMAL LOW (ref 13.0–17.0)
MCHC: 32.5 g/dL (ref 30.0–36.0)
MCV: 90.7 fl (ref 78.0–100.0)
Platelets: 183 10*3/uL (ref 150.0–400.0)
RBC: 4.12 Mil/uL — ABNORMAL LOW (ref 4.22–5.81)
RDW: 15.1 % (ref 11.5–15.5)
WBC: 9.4 10*3/uL (ref 4.0–10.5)

## 2019-05-30 LAB — COMPREHENSIVE METABOLIC PANEL
ALT: 17 U/L (ref 0–53)
AST: 16 U/L (ref 0–37)
Albumin: 4 g/dL (ref 3.5–5.2)
Alkaline Phosphatase: 118 U/L — ABNORMAL HIGH (ref 39–117)
BUN: 14 mg/dL (ref 6–23)
CO2: 37 mEq/L — ABNORMAL HIGH (ref 19–32)
Calcium: 9.2 mg/dL (ref 8.4–10.5)
Chloride: 99 mEq/L (ref 96–112)
Creatinine, Ser: 0.9 mg/dL (ref 0.40–1.50)
GFR: 114.89 mL/min (ref >60.00)
Glucose, Bld: 181 mg/dL — ABNORMAL HIGH (ref 70–99)
Potassium: 3.6 mEq/L (ref 3.5–5.1)
Sodium: 140 mEq/L (ref 135–145)
Total Bilirubin: 0.5 mg/dL (ref 0.2–1.2)
Total Protein: 7.8 g/dL (ref 6.0–8.3)

## 2019-05-30 LAB — BRAIN NATRIURETIC PEPTIDE: Pro B Natriuretic peptide (BNP): 19 pg/mL (ref 0.0–100.0)

## 2019-05-30 MED ORDER — FAMOTIDINE 20 MG PO TABS: 20.0000 mg | ORAL_TABLET | Freq: Every day | ORAL | 3 refills | Status: DC

## 2019-05-30 MED ORDER — FUROSEMIDE 80 MG PO TABS: 80.0000 mg | ORAL_TABLET | Freq: Four times a day (QID) | ORAL | 6 refills | Status: DC

## 2019-05-30 NOTE — Patient Instructions (Addendum)
We will check the blood work today and the chest x-ray.  We have sent in pepcid for the cough to try.   We can try taking the gabapentin 3 in the morning 2 in the afternoon and 3 in the evening.  Lyrica is another option for the pain too.   We have done an EKG today which is the same as 3 years ago.

## 2019-05-30 NOTE — Assessment & Plan Note (Signed)
Suspect combination of GERD and post nasal drip. Add pepcid daily to see if that helps. Checking CXR and labs also.

## 2019-05-30 NOTE — Assessment & Plan Note (Signed)
His neuropathy is increasing. Asked him to increase gabapentin to 3 pills in the morning, 2 in afternoon, 3 in evening to see if this different dosing schedule will help. Talked to him about lyrica if he wants to try change.

## 2019-05-30 NOTE — Progress Notes (Signed)
   Subjective:   Patient ID: Derrick [name], male    DOB: 1981-07-24, 38 y.o.   MRN: [id number]  HPI The patient is a 38 YO man coming in for cough (chronic and stable, has nasal drainage and taking meds for that, denies SOB, wants to get something for this, sometimes has GERD, denies fevers or chills, non-smoker, trach in place) and chest pain (getting a funny feeling in chest today on the way over, weight is up 10 pounds recently, hx heart failure, taking fluid pills and denies missing, denies pain with activity or walking, denies sweating, denies pain currently, lasted 5-30 minutes) and leg swelling (weight is increasing about 10 pounds in the last 2 months, he is unclear if some is weight or just fluid, denies change in diet, not low salt diet, uses compression stockings but frustrated with that long term).   Review of Systems  Constitutional: Positive for unexpected weight change. Negative for activity change and appetite change.  HENT: Negative.   Eyes: Negative.   Respiratory: Positive for cough. Negative for apnea, choking, chest tightness, shortness of breath, wheezing and stridor.   Cardiovascular: Positive for chest pain and leg swelling. Negative for palpitations.  Gastrointestinal: Negative for abdominal distention, abdominal pain, constipation, diarrhea, nausea and vomiting.  Musculoskeletal: Negative for arthralgias.  Skin: Negative.   Neurological: Positive for numbness. Negative for dizziness, syncope, speech difficulty, light-headedness and headaches.  Psychiatric/Behavioral: Negative.     Objective:  Physical Exam Constitutional:      Appearance: He is well-developed. He is obese.  HENT:     Head: Normocephalic and atraumatic.  Neck:     Musculoskeletal: Normal range of motion.  Cardiovascular:     Rate and Rhythm: Normal rate and regular rhythm.  Pulmonary:     Effort: Pulmonary effort is normal. No respiratory distress.     Breath sounds: Normal breath  sounds. No wheezing or rales.     Comments: Trach in place, covered Abdominal:     General: Bowel sounds are normal. There is no distension.     Palpations: Abdomen is soft.     Tenderness: There is no abdominal tenderness. There is no rebound.  Musculoskeletal:     Right lower leg: Edema present.     Left lower leg: Edema present.  Skin:    General: Skin is warm and dry.  Neurological:     Mental Status: He is alert and oriented to person, place, and time. Mental status is at baseline.     Coordination: Coordination normal.     Comments: neuropathy    EKG: Rate 85, axis normal, interval normal, low voltage, no st or t wave changes, stable from prior 2017  Vitals:   05/30/19 1430  BP: (!) 150/80  Pulse: 85  Temp: 98.3 F (36.8 C)  TempSrc: Oral  SpO2: 94%  Weight: (!) 427 lb (193.7 kg)  Height: 5\' 10"  (1.778 m)    Assessment & Plan:

## 2019-05-30 NOTE — Assessment & Plan Note (Addendum)
Concern for flare with weight gain and fluid. Checking BNP and chest x-ray today with the cough. EKG stable and done in office today compared to prior.

## 2019-06-03 ENCOUNTER — Other Ambulatory Visit (HOSPITAL_COMMUNITY)
Admission: RE | Admit: 2019-06-03 | Discharge: 2019-06-03 | Disposition: A | Discharge status: Home / Self Care | Payer: BLUE CROSS/BLUE SHIELD | Source: Ambulatory Visit | Attending: Acute Care | Admitting: Acute Care

## 2019-06-03 DIAGNOSIS — Z01812 Encounter for preprocedural laboratory examination: Secondary | ICD-10-CM | POA: Insufficient documentation

## 2019-06-03 DIAGNOSIS — Z20828 Contact with and (suspected) exposure to other viral communicable diseases: Secondary | ICD-10-CM | POA: Insufficient documentation

## 2019-06-03 LAB — SARS CORONAVIRUS 2 (TAT 6-24 HRS): SARS Coronavirus 2: NEGATIVE

## 2019-06-05 ENCOUNTER — Ambulatory Visit (HOSPITAL_COMMUNITY)
Admission: RE | Admit: 2019-06-05 | Discharge: 2019-06-05 | Disposition: A | Discharge status: Home / Self Care | Payer: BLUE CROSS/BLUE SHIELD | Source: Ambulatory Visit | Attending: Acute Care | Admitting: Acute Care

## 2019-06-05 ENCOUNTER — Other Ambulatory Visit: Payer: Self-pay

## 2019-06-05 DIAGNOSIS — R05 Cough: Secondary | ICD-10-CM | POA: Insufficient documentation

## 2019-06-05 DIAGNOSIS — K219 Gastro-esophageal reflux disease without esophagitis: Secondary | ICD-10-CM | POA: Insufficient documentation

## 2019-06-05 DIAGNOSIS — I5030 Unspecified diastolic (congestive) heart failure: Secondary | ICD-10-CM | POA: Diagnosis not present

## 2019-06-05 DIAGNOSIS — Z4682 Encounter for fitting and adjustment of non-vascular catheter: Secondary | ICD-10-CM | POA: Insufficient documentation

## 2019-06-05 DIAGNOSIS — Z930 Tracheostomy status: Secondary | ICD-10-CM

## 2019-06-05 DIAGNOSIS — G4733 Obstructive sleep apnea (adult) (pediatric): Secondary | ICD-10-CM | POA: Diagnosis not present

## 2019-06-05 NOTE — Plan of Care (Signed)
Today we changed your trach.   Instructions:  1) continue current trach care 2) return for follow up 8 weeks (don't forget you will need COVID-19 test first) 3) I would try the pepcid twice a day for first week, then back down to daily and see if this helps w/ cough.   Erick Colace ACNP-BC [place] [phone]

## 2019-06-05 NOTE — Progress Notes (Signed)
[facility]   Reason for visit:  Routine trach change HPI:  38 year old male well known to me for several years now. I follow him for trach management in setting of OSA.  Presents today for routine and planned trach change  ROS  Review of Systems - History obtained from the patient General ROS: negative for - chills, fatigue or fever Psychological ROS: negative ENT ROS: negative for - epistaxis, headaches, nasal congestion, nasal discharge, sinus pain, sneezing or sore throat Hematological and Lymphatic ROS: negative for - bleeding problems, blood clots, fatigue or swollen lymph nodes Endocrine ROS: negative Respiratory ROS: positive for - cough Cardiovascular ROS: no chest pain or dyspnea on exertion Gastrointestinal ROS: no abdominal pain, change in bowel habits, or black or bloody stools Genito-Urinary ROS: no dysuria, trouble voiding, or hematuria Musculoskeletal ROS: negative Neurological ROS: no TIA or stroke symptoms    Vital signs:  Reviewed  Exam:  Physical Exam Vitals signs reviewed.  Constitutional:      General: He is not in acute distress.    Appearance: Normal appearance. He is not ill-appearing, toxic-appearing or diaphoretic.  HENT:     Head: Normocephalic and atraumatic.     Nose: Nose normal. No congestion or rhinorrhea.     Mouth/Throat:     Mouth: Mucous membranes are moist.     Pharynx: Oropharynx is clear. No oropharyngeal exudate.  Eyes:     Pupils: Pupils are equal, round, and reactive to light.  Neck:     Musculoskeletal: Normal range of motion.     Comments: Lurline Idol site/stoma is unremarkable  Cardiovascular:     Rate and Rhythm: Normal rate.     Pulses: Normal pulses.     Heart sounds: No murmur.  Pulmonary:     Effort: Pulmonary effort is normal. No respiratory distress.     Breath sounds: No wheezing or rales.  Abdominal:     General: Bowel sounds are normal.  Musculoskeletal: Normal range of motion.  Skin:  General: Skin is warm.     Capillary Refill: Capillary refill takes less than 2 seconds.  Neurological:     General: No focal deficit present.     Mental Status: He is alert and oriented to person, place, and time. Mental status is at baseline.  Psychiatric:        Mood and Affect: Mood normal.     Trach change/procedure:  Number 6 biovona portex  Removed w/out difficulty. Site inspected and unremarkable. New # 6 placed w/out difficulty.       Impression/dx  OSA OHS Trach dependence Diastolic HF  Cough GERD Discussion  [name] is doing well from a trach stand-pont. I agree w/ his PCP that his cough is likely GERD. He has yet to start treatment for it but we discussed cough and reflux relationship and he is now going to give it a try. He is continuing to work w/ [facility] re: gastric bypass surg for weight loss. I have told him I will be happy to talk w/ any of the folks from [facility] if that would help in his care.  Plan  Cont routine trach care ROV 8 weeks     Visit time: 33 minutes.   Erick Colace ACNP-BC [place]

## 2019-06-05 NOTE — Progress Notes (Signed)
Tracheostomy Procedure Note  Derrick [name] [id number] [birth year]  Pre Procedure Tracheostomy Information  Trach Brand: Bivona Portex 6 uncuffed Size: 6.0 Style: Uncuffed Secured by: Velcro   Procedure: Trach cleaning and trach change done VVS oxygen sats 98%  HR 90  RR18    Post Procedure Tracheostomy Information  Trach Brand: Bivona Portex 6 uncuffed Size: 6.0 Style: Uncuffed Secured by: Velcro   Post Procedure Evaluation:  ETCO2 positive color change from yellow to purple : Yes.   Vital signs:blood pressure VVA, pulse 90 respirations 18 and pulse oximetry 98 % Patients current condition: stable Complications: No apparent complications Trach site exam: clean Wound care done: dry Patient did tolerate procedure well.   Education: none  Prescription needs: none    Additional needs: none

## 2019-06-10 ENCOUNTER — Other Ambulatory Visit: Payer: Self-pay | Admitting: Internal Medicine

## 2019-06-10 DIAGNOSIS — E118 Type 2 diabetes mellitus with unspecified complications: Secondary | ICD-10-CM

## 2019-06-11 ENCOUNTER — Encounter: Payer: Self-pay | Admitting: Family

## 2019-06-11 ENCOUNTER — Ambulatory Visit (INDEPENDENT_AMBULATORY_CARE_PROVIDER_SITE_OTHER): Payer: BLUE CROSS/BLUE SHIELD | Admitting: Family

## 2019-06-11 DIAGNOSIS — E118 Type 2 diabetes mellitus with unspecified complications: Secondary | ICD-10-CM | POA: Diagnosis not present

## 2019-06-11 DIAGNOSIS — R05 Cough: Secondary | ICD-10-CM | POA: Diagnosis not present

## 2019-06-11 DIAGNOSIS — R053 Chronic cough: Secondary | ICD-10-CM

## 2019-06-11 DIAGNOSIS — I5032 Chronic diastolic (congestive) heart failure: Secondary | ICD-10-CM | POA: Diagnosis not present

## 2019-06-11 MED ORDER — DOXYCYCLINE HYCLATE 100 MG PO TABS: 100.0000 mg | ORAL_TABLET | Freq: Two times a day (BID) | ORAL | 0 refills | Status: DC

## 2019-06-11 MED ORDER — FLUTICASONE PROPIONATE 50 MCG/ACT NA SUSP: 2.0000 | Freq: Every day | NASAL | 6 refills | Status: DC

## 2019-06-11 MED ORDER — GABAPENTIN 300 MG PO CAPS: ORAL_CAPSULE | ORAL | 0 refills | Status: DC

## 2019-06-11 MED ORDER — ALLOPURINOL 300 MG PO TABS: 300.0000 mg | ORAL_TABLET | Freq: Every day | ORAL | 0 refills | Status: DC

## 2019-06-11 NOTE — Progress Notes (Signed)
Derrick [name] is a 38 y.o. male with the following history as recorded in EpicCare:  Patient Active Problem List   Diagnosis Date Noted  . Left leg pain 04/01/2019  . Vitamin D deficiency 11/12/2017  . Tinnitus 09/27/2017  . Numbness and tingling of hand 11/30/2016  . Tracheostomy dependence (HCC)   . Routine general medical examination at a health care facility 09/21/2016  . Acne 09/21/2016  . ED (erectile dysfunction) 06/02/2016  . Tracheostomy status (HCC)   . Cough   . Other allergic rhinitis   . Diabetes mellitus type 2 with complications (HCC) 08/11/2015  . Left ankle pain 08/11/2015  . Dysphagia   . Pulmonary hypertension (HCC)   . Morbid obesity (HCC) 06/13/2015  . Essential hypertension 06/13/2015  . OSA (obstructive sleep apnea) 06/13/2015  . Obesity hypoventilation syndrome (HCC) 06/13/2015  . Chronic diastolic heart failure (HCC) 06/13/2015    Current Outpatient Medications  Medication Sig Dispense Refill  . albuterol (PROVENTIL HFA;VENTOLIN HFA) 108 (90 Base) MCG/ACT inhaler Inhale 2 puffs into the lungs every 6 (six) hours as needed for wheezing or shortness of breath. 1 Inhaler 0  . allopurinol (ZYLOPRIM) 300 MG tablet Take 1 tablet (300 mg total) by mouth daily. 90 tablet 0  . Blood Glucose Monitoring Suppl (BAYER CONTOUR NEXT MONITOR) w/Device KIT Use to check blood sugars daily Dx E11.9 1 kit 0  . doxycycline (VIBRA-TABS) 100 MG tablet Take 1 tablet (100 mg total) by mouth 2 (two) times daily. 20 tablet 0  . famotidine (PEPCID) 20 MG tablet Take 1 tablet (20 mg total) by mouth daily. 90 tablet 3  . fluticasone (FLONASE) 50 MCG/ACT nasal spray Place 2 sprays into both nostrils daily. 16 g 6  . fluticasone (FLOVENT HFA) 110 MCG/ACT inhaler Inhale 2 puffs into the lungs 2 (two) times daily. 1 Inhaler 2  . furosemide (LASIX) 80 MG tablet Take 1 tablet (80 mg total) by mouth 4 (four) times daily. 120 tablet 6  . gabapentin (NEURONTIN) 300 MG capsule TAKE 2  CAPSULES BY MOUTH 4 TIMES DAILY 240 capsule 0  . glipiZIDE (GLUCOTROL XL) 10 MG 24 hr tablet Take 1 tablet (10 mg total) by mouth daily with breakfast. 90 tablet 3  . glucose blood test strip Use as instructed 100 each 12  . guaiFENesin (MUCINEX) 600 MG 12 hr tablet Take 1 tablet (600 mg total) by mouth 2 (two) times daily. 60 tablet 11  . JANUVIA 100 MG tablet Take 1 tablet by mouth once daily 90 tablet 1  . levocetirizine (XYZAL) 5 MG tablet TAKE 1 TABLET BY MOUTH ONCE DAILY IN THE EVENING 30 tablet 6  . metoprolol tartrate (LOPRESSOR) 50 MG tablet Take 1 tablet by mouth twice daily 180 tablet 1  . montelukast (SINGULAIR) 10 MG tablet Take 1 tablet (10 mg total) by mouth at bedtime. 90 tablet 3  . ONETOUCH DELICA LANCETS 33G MISC Use as needed daily 100 each 11  . pioglitazone (ACTOS) 30 MG tablet Take 1 tablet by mouth once daily 90 tablet 1  . potassium chloride SA (K-DUR) 20 MEQ tablet Take 1 tablet (20 mEq total) by mouth 3 (three) times daily. 270 tablet 3  . sildenafil (VIAGRA) 100 MG tablet TAKE 1/2 TO 1 (ONE-HALF TO ONE) TABLET BY MOUTH ONCE DAILY AS NEEDED FOR ERECTILE DYSFUNCTION 4 tablet 0  . Tracheostomy Care KIT 30 kits by Does not apply route daily. 30 each 6   No current facility-administered medications for this visit.       Allergies: Shrimp [shellfish allergy], Vancomycin, and Zosyn [piperacillin sod-tazobactam so]  Past Medical History:  Diagnosis Date  . Acute on chronic diastolic CHF (congestive heart failure), NYHA class 1 (HCC)   . Diabetes mellitus without complication (HCC)   . GERD (gastroesophageal reflux disease)   . Hypertension   . Reflux     Past Surgical History:  Procedure Laterality Date  . TRACHEOSTOMY TUBE PLACEMENT N/A 06/21/2015   Procedure: TRACHEOSTOMY;  Surgeon: Su Teoh, MD;  Location: [facility] OR;  Service: ENT;  Laterality: N/A;    Family History  Problem Relation Age of Onset  . Diabetes Maternal Grandmother   . Hypertension Maternal Grandmother    . Asthma Other   . Cancer Other   . Stroke Other   . Heart disease Other     Social History   Tobacco Use  . Smoking status: Never Smoker  . Smokeless tobacco: Never Used  Substance Use Topics  . Alcohol use: No    Subjective:    I connected with Derrick [name] on 06/11/19 at 10:00 AM EST by a video enabled telemedicine application and verified that I am speaking with the correct person using two identifiers.   I discussed the limitations of evaluation and management by telemedicine and the availability of in person appointments. The patient expressed understanding and agreed to proceed. Provider in the office/ patient is at home;  Was seen on October 30 with concerns for cough/ congestion which was felt to be allergies or related to heart failure; BNP was normal but patient did not understand to get a CXR; notes that symptoms have persisted and worsened; continuing to have loss of sense of taste or smell but has had negative COVID test as of 06/03/2019; is currently using his allergy medication and Mucinex;   Also requesting refills on his Allopurinol and Neurontin as his PCP is out of the office for the rest of the week.    Objective:  There were no vitals filed for this visit.  General: Well developed, well nourished, in no acute distress  Head: Normocephalic and atraumatic  Lungs: Respirations unlabored;  Neurologic: Alert and oriented; speech intact; face symmetrical;   Assessment:  1. Chronic cough   2. Chronic diastolic heart failure (HCC)   3. Type 2 diabetes mellitus with complication, without long-term current use of insulin (HCC)     Plan:  1. ? Secondary infection; encouraged to get CXR that was recommended by his PCP at last OV; order updated; Rx for Doycycline 100 mg bid x 10 days; Refills updated on Allopurinol and Gabapentin as requested;   No follow-ups on file.  Orders Placed This Encounter  Procedures  . DG Chest 2 View    Standing Status:    Future    Standing Expiration Date:   08/10/2020    Order Specific Question:   Reason for Exam (SYMPTOM  OR DIAGNOSIS REQUIRED)    Answer:   Chronic cough    Order Specific Question:   Preferred imaging location?    Answer:   [address]    Order Specific Question:   Radiology Contrast Protocol - do NOT remove file path    Answer:   [email or web address]    Requested Prescriptions   Signed Prescriptions Disp Refills  . gabapentin (NEURONTIN) 300 MG capsule 240 capsule 0    Sig: TAKE 2 CAPSULES BY MOUTH 4 TIMES DAILY  . allopurinol (ZYLOPRIM) 300 MG tablet 90 tablet 0    Sig: Take 1 tablet (300   mg total) by mouth daily.  . fluticasone (FLONASE) 50 MCG/ACT nasal spray 16 g 6    Sig: Place 2 sprays into both nostrils daily.  Marland Kitchen doxycycline (VIBRA-TABS) 100 MG tablet 20 tablet 0    Sig: Take 1 tablet (100 mg total) by mouth 2 (two) times daily.

## 2019-06-16 ENCOUNTER — Encounter (HOSPITAL_BASED_OUTPATIENT_CLINIC_OR_DEPARTMENT_OTHER): Payer: Self-pay | Admitting: *Deleted

## 2019-06-16 ENCOUNTER — Emergency Department (HOSPITAL_BASED_OUTPATIENT_CLINIC_OR_DEPARTMENT_OTHER): Payer: BLUE CROSS/BLUE SHIELD

## 2019-06-16 ENCOUNTER — Inpatient Hospital Stay (HOSPITAL_BASED_OUTPATIENT_CLINIC_OR_DEPARTMENT_OTHER)
Admission: EM | Admit: 2019-06-16 | Discharge: 2019-06-21 | DRG: 177 | Disposition: A | Discharge status: Home / Self Care | Payer: BLUE CROSS/BLUE SHIELD | Attending: Internal Medicine | Admitting: Internal Medicine

## 2019-06-16 ENCOUNTER — Telehealth: Payer: Self-pay | Admitting: Internal Medicine

## 2019-06-16 ENCOUNTER — Other Ambulatory Visit: Payer: Self-pay

## 2019-06-16 DIAGNOSIS — J9621 Acute and chronic respiratory failure with hypoxia: Secondary | ICD-10-CM | POA: Diagnosis present

## 2019-06-16 DIAGNOSIS — Z6841 Body Mass Index (BMI) 40.0 and over, adult: Secondary | ICD-10-CM

## 2019-06-16 DIAGNOSIS — R059 Cough, unspecified: Secondary | ICD-10-CM

## 2019-06-16 DIAGNOSIS — G4733 Obstructive sleep apnea (adult) (pediatric): Secondary | ICD-10-CM | POA: Diagnosis present

## 2019-06-16 DIAGNOSIS — E662 Morbid (severe) obesity with alveolar hypoventilation: Secondary | ICD-10-CM | POA: Diagnosis present

## 2019-06-16 DIAGNOSIS — Z20828 Contact with and (suspected) exposure to other viral communicable diseases: Secondary | ICD-10-CM

## 2019-06-16 DIAGNOSIS — E876 Hypokalemia: Secondary | ICD-10-CM | POA: Diagnosis not present

## 2019-06-16 DIAGNOSIS — Z881 Allergy status to other antibiotic agents status: Secondary | ICD-10-CM

## 2019-06-16 DIAGNOSIS — Z79899 Other long term (current) drug therapy: Secondary | ICD-10-CM

## 2019-06-16 DIAGNOSIS — I10 Essential (primary) hypertension: Secondary | ICD-10-CM | POA: Diagnosis present

## 2019-06-16 DIAGNOSIS — R05 Cough: Secondary | ICD-10-CM

## 2019-06-16 DIAGNOSIS — Z833 Family history of diabetes mellitus: Secondary | ICD-10-CM

## 2019-06-16 DIAGNOSIS — I272 Pulmonary hypertension, unspecified: Secondary | ICD-10-CM | POA: Diagnosis present

## 2019-06-16 DIAGNOSIS — U071 COVID-19: Secondary | ICD-10-CM | POA: Diagnosis not present

## 2019-06-16 DIAGNOSIS — T502X5A Adverse effect of carbonic-anhydrase inhibitors, benzothiadiazides and other diuretics, initial encounter: Secondary | ICD-10-CM | POA: Diagnosis not present

## 2019-06-16 DIAGNOSIS — J189 Pneumonia, unspecified organism: Secondary | ICD-10-CM | POA: Diagnosis present

## 2019-06-16 DIAGNOSIS — Z8249 Family history of ischemic heart disease and other diseases of the circulatory system: Secondary | ICD-10-CM

## 2019-06-16 DIAGNOSIS — Z20822 Contact with and (suspected) exposure to covid-19: Secondary | ICD-10-CM

## 2019-06-16 DIAGNOSIS — K219 Gastro-esophageal reflux disease without esophagitis: Secondary | ICD-10-CM | POA: Diagnosis present

## 2019-06-16 DIAGNOSIS — Z8639 Personal history of other endocrine, nutritional and metabolic disease: Secondary | ICD-10-CM | POA: Diagnosis present

## 2019-06-16 DIAGNOSIS — J9611 Chronic respiratory failure with hypoxia: Secondary | ICD-10-CM | POA: Diagnosis present

## 2019-06-16 DIAGNOSIS — E118 Type 2 diabetes mellitus with unspecified complications: Secondary | ICD-10-CM | POA: Diagnosis present

## 2019-06-16 DIAGNOSIS — J1289 Other viral pneumonia: Secondary | ICD-10-CM | POA: Diagnosis present

## 2019-06-16 DIAGNOSIS — E119 Type 2 diabetes mellitus without complications: Secondary | ICD-10-CM | POA: Diagnosis present

## 2019-06-16 DIAGNOSIS — Z880 Allergy status to penicillin: Secondary | ICD-10-CM

## 2019-06-16 DIAGNOSIS — G894 Chronic pain syndrome: Secondary | ICD-10-CM | POA: Diagnosis present

## 2019-06-16 DIAGNOSIS — Z23 Encounter for immunization: Secondary | ICD-10-CM

## 2019-06-16 DIAGNOSIS — I110 Hypertensive heart disease with heart failure: Secondary | ICD-10-CM | POA: Diagnosis present

## 2019-06-16 DIAGNOSIS — Z91013 Allergy to seafood: Secondary | ICD-10-CM

## 2019-06-16 DIAGNOSIS — Z7984 Long term (current) use of oral hypoglycemic drugs: Secondary | ICD-10-CM

## 2019-06-16 DIAGNOSIS — I5032 Chronic diastolic (congestive) heart failure: Secondary | ICD-10-CM | POA: Diagnosis present

## 2019-06-16 DIAGNOSIS — Z930 Tracheostomy status: Secondary | ICD-10-CM

## 2019-06-16 HISTORY — DX: Morbid (severe) obesity due to excess calories: E66.01

## 2019-06-16 HISTORY — DX: Morbid (severe) obesity with alveolar hypoventilation: E66.2

## 2019-06-16 HISTORY — DX: Obstructive sleep apnea (adult) (pediatric): G47.33

## 2019-06-16 HISTORY — DX: Hypoxemia: R09.02

## 2019-06-16 LAB — COMPREHENSIVE METABOLIC PANEL
ALT: 20 U/L (ref 0–44)
AST: 26 U/L (ref 15–41)
Albumin: 3.6 g/dL (ref 3.5–5.0)
Alkaline Phosphatase: 97 U/L (ref 38–126)
Anion gap: 10 (ref 5–15)
BUN: 17 mg/dL (ref 6–20)
CO2: 28 mmol/L (ref 22–32)
Calcium: 8.3 mg/dL — ABNORMAL LOW (ref 8.9–10.3)
Chloride: 99 mmol/L (ref 98–111)
Creatinine, Ser: 1.08 mg/dL (ref 0.61–1.24)
GFR calc Af Amer: >60 mL/min (ref >60)
GFR calc non Af Amer: >60 mL/min (ref >60)
Glucose, Bld: 123 mg/dL — ABNORMAL HIGH (ref 70–99)
Potassium: 3.6 mmol/L (ref 3.5–5.1)
Sodium: 137 mmol/L (ref 135–145)
Total Bilirubin: 0.6 mg/dL (ref 0.3–1.2)
Total Protein: 7.7 g/dL (ref 6.5–8.1)

## 2019-06-16 LAB — CBC WITH DIFFERENTIAL/PLATELET
Abs Immature Granulocytes: 0.02 10*3/uL (ref 0.00–0.07)
Basophils Absolute: 0 10*3/uL (ref 0.0–0.1)
Basophils Relative: 0 %
Eosinophils Absolute: 0 10*3/uL (ref 0.0–0.5)
Eosinophils Relative: 0 %
HCT: 41.9 % (ref 39.0–52.0)
Hemoglobin: 12.9 g/dL — ABNORMAL LOW (ref 13.0–17.0)
Immature Granulocytes: 0 %
Lymphocytes Relative: 33 %
Lymphs Abs: 1.5 10*3/uL (ref 0.7–4.0)
MCH: 29.1 pg (ref 26.0–34.0)
MCHC: 30.8 g/dL (ref 30.0–36.0)
MCV: 94.4 fL (ref 80.0–100.0)
Monocytes Absolute: 0.4 10*3/uL (ref 0.1–1.0)
Monocytes Relative: 10 %
Neutro Abs: 2.5 10*3/uL (ref 1.7–7.7)
Neutrophils Relative %: 57 %
Platelets: 167 10*3/uL (ref 150–400)
RBC: 4.44 MIL/uL (ref 4.22–5.81)
RDW: 14.2 % (ref 11.5–15.5)
WBC: 4.5 10*3/uL (ref 4.0–10.5)
nRBC: 0 % (ref 0.0–0.2)

## 2019-06-16 LAB — SARS CORONAVIRUS 2 (TAT 6-24 HRS): SARS Coronavirus 2: POSITIVE — AB

## 2019-06-16 MED ORDER — SODIUM CHLORIDE 0.9 % IV SOLN
INTRAVENOUS | Status: DC | PRN
  Administered 2019-06-16: 500 mL via INTRAVENOUS

## 2019-06-16 MED ORDER — SODIUM CHLORIDE 0.9 % IV SOLN
1.0000 g | Freq: Once | INTRAVENOUS | Status: AC
  Administered 2019-06-16: 1 g via INTRAVENOUS
  Filled 2019-06-16: qty 10

## 2019-06-16 NOTE — Telephone Encounter (Signed)
FYI:  Patient called in stating that he was feeling worse since his last Virtual Visit.  States that he has now developed loss of taste and smell and that a rattle has developed in his chest.   I have advised patient to go to [facility] or ED.   Patient states he is going to go to [facility] ED on [address].

## 2019-06-16 NOTE — ED Triage Notes (Signed)
C/o weakness, cough, loss of smell, loss of taste, and diarrhea x 5 days. He had a negative Covid test 10 days ago.

## 2019-06-16 NOTE — Progress Notes (Signed)
#   6.0 Cfs bivona trach in place, PMV in use no resp distress noted.  Talking in complete sentences with in resp effort.  Stoma unremarkable.  VS per flow sheet

## 2019-06-16 NOTE — ED Notes (Signed)
Pt ambulated in room, 88% O2 . Pt placed on 2L, 100%

## 2019-06-16 NOTE — ED Provider Notes (Signed)
[facility] Provider Note   CSN: [id number] Arrival date & time: 06/16/19  1336     History   Chief Complaint Cough, weakness, loss sense of smell and taste, diarrhea  HPI Derrick [name] is a 38 y.o. male past medical history significant for acute on chronic diastolic CHF, diabetes, GERD, hypertension, morbid obesity presents to emergency department today with multiple chief complaints.  He is reporting generalized weakness, chills, productive cough, loss of smell, loss of taste, diarrhea x5 days.  He had a negative Covid test 10 days ago as a preprocedure test for his trach exchange.  He is describing cough as productive with thin mucus.  He says the mucus ranges in color from brown to clear.  He takes Mucinex daily which he thinks helps clear his secretions.  He saw his primary care provider 4 days ago for the symptoms and was started on doxycycline.  He states he has been taking it as prescribed.  He says since taking the antibiotic he feels like his generalized weakness has improved.  He also reports to decreased appetite.  He has however increased his p.o. fluid intake.  He reports he works in [employer] and states one of the members tested positive recently.  He was not in close contact with the person but in the same room has them before knowing the positive result.  He denies fever, sore throat, shortness of breath, wheezing, abdominal pain, nausea, vomiting, urinary symptoms, blood in stool.   Past Medical History:  Diagnosis Date   Acute on chronic diastolic CHF (congestive heart failure), NYHA class 1 (HCC)    Diabetes mellitus without complication (HCC)    GERD (gastroesophageal reflux disease)    Hypertension    Hypoxemia    Morbid obesity ([place])    Obesity hypoventilation syndrome (HCC)    OSA (obstructive sleep apnea)    Reflux     Patient Active Problem List   Diagnosis Date Noted   Left leg pain 04/01/2019   Vitamin  D deficiency 11/12/2017   Tinnitus 09/27/2017   Numbness and tingling of hand 11/30/2016   Tracheostomy dependence ([place])    Routine general medical examination at a health care facility 09/21/2016   Acne 09/21/2016   ED (erectile dysfunction) 06/02/2016   Tracheostomy status ([place])    Cough    Other allergic rhinitis    Diabetes mellitus type 2 with complications ([place]) 37/16/9678   Left ankle pain 08/11/2015   Dysphagia    Pulmonary hypertension ([place])    Morbid obesity ([place]) 06/13/2015   Essential hypertension 06/13/2015   OSA (obstructive sleep apnea) 06/13/2015   Obesity hypoventilation syndrome ([place]) 06/13/2015   Chronic diastolic heart failure ([place]) 06/13/2015    Past Surgical History:  Procedure Laterality Date   TRACHEOSTOMY TUBE PLACEMENT N/A 06/21/2015   Procedure: TRACHEOSTOMY;  Surgeon: Leta Baptist, MD;  Location: [facility] OR;  Service: ENT;  Laterality: N/A;        Home Medications    Prior to Admission medications   Medication Sig Start Date End Date Taking? Authorizing Provider  albuterol (PROVENTIL HFA;VENTOLIN HFA) 108 (90 Base) MCG/ACT inhaler Inhale 2 puffs into the lungs every 6 (six) hours as needed for wheezing or shortness of breath. 07/30/18   Hoyt Koch, MD  allopurinol (ZYLOPRIM) 300 MG tablet Take 1 tablet (300 mg total) by mouth daily. 06/11/19   Marrian Salvage, FNP  Blood Glucose Monitoring Suppl (BAYER CONTOUR NEXT MONITOR) w/Device KIT Use to  check blood sugars daily Dx E11.9 10/16/16   Hoyt Koch, MD  doxycycline (VIBRA-TABS) 100 MG tablet Take 1 tablet (100 mg total) by mouth 2 (two) times daily. 06/11/19   Marrian Salvage, FNP  famotidine (PEPCID) 20 MG tablet Take 1 tablet (20 mg total) by mouth daily. 05/30/19   Hoyt Koch, MD  fluticasone Asencion Islam) 50 MCG/ACT nasal spray Place 2 sprays into both nostrils daily. 06/11/19   Marrian Salvage, FNP  fluticasone (FLOVENT HFA) 110 MCG/ACT  inhaler Inhale 2 puffs into the lungs 2 (two) times daily. 07/30/18   Hoyt Koch, MD  furosemide (LASIX) 80 MG tablet Take 1 tablet (80 mg total) by mouth 4 (four) times daily. 05/30/19   Hoyt Koch, MD  gabapentin (NEURONTIN) 300 MG capsule TAKE 2 CAPSULES BY MOUTH 4 TIMES DAILY 06/11/19   Marrian Salvage, FNP  glipiZIDE (GLUCOTROL XL) 10 MG 24 hr tablet Take 1 tablet (10 mg total) by mouth daily with breakfast. 11/27/17   Hoyt Koch, MD  glucose blood test strip Use as instructed 11/30/16   Hoyt Koch, MD  guaiFENesin (MUCINEX) 600 MG 12 hr tablet Take 1 tablet (600 mg total) by mouth 2 (two) times daily. 09/18/16   Hoyt Koch, MD  JANUVIA 100 MG tablet Take 1 tablet by mouth once daily 01/02/19   Hoyt Koch, MD  levocetirizine (XYZAL) 5 MG tablet TAKE 1 TABLET BY MOUTH ONCE DAILY IN THE EVENING 01/14/19   Hoyt Koch, MD  metoprolol tartrate (LOPRESSOR) 50 MG tablet Take 1 tablet by mouth twice daily 03/13/19   Hoyt Koch, MD  montelukast (SINGULAIR) 10 MG tablet Take 1 tablet (10 mg total) by mouth at bedtime. 04/09/18   Hoyt Koch, MD  [facility] DELICA LANCETS 20B MISC Use as needed daily 11/30/16   Hoyt Koch, MD  pioglitazone (ACTOS) 30 MG tablet Take 1 tablet by mouth once daily 01/02/19   Hoyt Koch, MD  potassium chloride SA (K-DUR) 20 MEQ tablet Take 1 tablet (20 mEq total) by mouth 3 (three) times daily. 04/03/19   Hoyt Koch, MD  sildenafil (VIAGRA) 100 MG tablet TAKE 1/2 TO 1 (ONE-HALF TO ONE) TABLET BY MOUTH ONCE DAILY AS NEEDED FOR ERECTILE DYSFUNCTION 04/18/19   Hoyt Koch, MD  Tracheostomy Care KIT 30 kits by Does not apply route daily. 09/27/15   Erick Colace, NP    Family History Family History  Problem Relation Age of Onset   Diabetes Maternal Grandmother    Hypertension Maternal Grandmother    Asthma Other    Cancer Other    Stroke  Other    Heart disease Other     Social History Social History   Tobacco Use   Smoking status: Never Smoker   Smokeless tobacco: Never Used  Substance Use Topics   Alcohol use: No   Drug use: No     Allergies   Shrimp [shellfish allergy], Vancomycin, and Zosyn [piperacillin sod-tazobactam so]   Review of Systems Review of Systems  Constitutional: Positive for chills. Negative for fever.  HENT: Positive for congestion. Negative for ear pain, facial swelling, rhinorrhea, sinus pressure, sinus pain, sore throat and voice change.   Eyes: Negative for pain and redness.  Respiratory: Positive for cough. Negative for shortness of breath and wheezing.   Cardiovascular: Negative for chest pain and palpitations.  Gastrointestinal: Positive for diarrhea. Negative for abdominal pain, constipation, nausea and vomiting.  Genitourinary: Negative for dysuria.  Musculoskeletal: Positive for arthralgias. Negative for back pain, myalgias and neck pain.  Skin: Negative for rash and wound.  Neurological: Negative for dizziness, syncope, weakness, numbness and headaches.  Psychiatric/Behavioral: Negative for confusion.     Physical Exam Updated Vital Signs BP (!) 144/96    Pulse 85    Temp 99.8 F (37.7 C) (Oral)    Resp 15    SpO2 95%   Physical Exam Vitals signs and nursing note reviewed.  Constitutional:      General: He is not in acute distress.    Appearance: He is obese. He is not ill-appearing.  HENT:     Head: Normocephalic.     Comments: Patient has trach.  Surrounding skin appears healthy, no surrounding erythema.    Right Ear: Tympanic membrane and external ear normal.     Left Ear: Tympanic membrane and external ear normal.     Nose: Nose normal.     Mouth/Throat:     Mouth: Mucous membranes are moist.     Pharynx: Oropharynx is clear.  Eyes:     General: No scleral icterus.       Right eye: No discharge.        Left eye: No discharge.     Extraocular Movements:  Extraocular movements intact.     Conjunctiva/sclera: Conjunctivae normal.     Pupils: Pupils are equal, round, and reactive to light.  Neck:     Musculoskeletal: Normal range of motion.     Vascular: No JVD.  Cardiovascular:     Rate and Rhythm: Normal rate and regular rhythm.     Pulses: Normal pulses.          Radial pulses are 2+ on the right side and 2+ on the left side.     Heart sounds: Normal heart sounds.  Pulmonary:     Comments: Lungs clear to auscultation in all fields. Symmetric chest rise. No wheezing, rales, or rhonchi. Abdominal:     Comments: Abdomen is soft, non-distended, and non-tender in all quadrants. No rigidity, no guarding. No peritoneal signs.  Musculoskeletal: Normal range of motion.  Skin:    General: Skin is warm and dry.     Capillary Refill: Capillary refill takes less than 2 seconds.  Neurological:     Mental Status: He is oriented to person, place, and time.     GCS: GCS eye subscore is 4. GCS verbal subscore is 5. GCS motor subscore is 6.     Comments: Fluent speech, no facial droop.  Psychiatric:        Behavior: Behavior normal.      ED Treatments / Results  Labs (all labs ordered are listed, but only abnormal results are displayed) Labs Reviewed  COMPREHENSIVE METABOLIC PANEL - Abnormal; Notable for the following components:      Result Value   Glucose, Bld 123 (*)    Calcium 8.3 (*)    All other components within normal limits  CBC WITH DIFFERENTIAL/PLATELET - Abnormal; Notable for the following components:   Hemoglobin 12.9 (*)    All other components within normal limits  SARS CORONAVIRUS 2 (TAT 6-24 HRS)    EKG None  Radiology Dg Chest Portable 1 View  Result Date: 06/16/2019 CLINICAL DATA:  Productive cough, shortness of breath weakness, loss of smell/taste, diarrhea EXAM: PORTABLE CHEST 1 VIEW COMPARISON:  12/28/2018 FINDINGS: Mild lingular/left lower lobe opacity, atelectasis versus pneumonia. Additional perihilar  opacities are possible bilaterally, versus pulmonary  vascular congestion. No definite pleural effusions. No pneumothorax. Cardiomegaly. Tracheostomy, in satisfactory position. IMPRESSION: Mild lingular/left lower lobe opacity, atelectasis versus pneumonia. Electronically Signed   By: Julian Hy M.D.   On: 06/16/2019 15:58    Procedures Procedures (including critical care time)  Medications Ordered in ED Medications - No data to display   Initial Impression / Assessment and Plan / ED Course  I have reviewed the triage vital signs and the nursing notes.  Pertinent labs & imaging results that were available during my care of the patient were reviewed by me and considered in my medical decision making (see chart for details).  Patient seen and examined. Patient nontoxic appearing, in no apparent distress.  On arrival he has low-grade temp of 99.8, normotensive, 94% on room air.  His lungs sound clear to auscultation in all fields.  No abdominal tenderness, no peritoneal signs.  Chest x-ray viewed by myself and ED attending shows opacity in left lower lobe, possible atelectasis versus pneumonia.  CBC with no leukocytosis, no anemia, no severe electrolyte derangement, no renal insufficiency normal LFTs.  Covid test performed and is pending.  Patient has as needed oxygen at home.  While in the ED today he has had multiple bouts of hypoxia dropping to 87-88%.  With ambulation he dropped to 88% on room air.  Given patient's comorbidities he would benefit from admission given his hypoxia and possible pneumonia vs covid.  Patient given IV ceftriaxone for possible pneumonia.  He is already taking doxycycline today.  He has documented allergies to vancomycin and Zosyn.  When asked what the reaction was he states he had bad kidney function when taking these medicines.  No anaphylaxis or allergic reactions.  This case was discussed with Dr. Sedonia Small who has seen the patient and agrees with plan to admit.  Spoke with Dr. Myna Hidalgo with hospitalist service who agrees to assume care of patient and bring into the hospital for further evaluation and management.  He will follow up with covid test and admit to [facility] if positive.    Derrick [name] was evaluated in Emergency Department on 06/16/2019 for the symptoms described in the history of present illness. He was evaluated in the context of the global COVID-19 pandemic, which necessitated consideration that the patient might be at risk for infection with the SARS-CoV-2 virus that causes COVID-19. Institutional protocols and algorithms that pertain to the evaluation of patients at risk for COVID-19 are in a state of rapid change based on information released by regulatory bodies including the CDC and federal and state organizations. These policies and algorithms were followed during the patient's care in the ED.   Portions of this note were generated with [employer]. Dictation errors may occur despite best attempts at proofreading.    Final Clinical Impressions(s) / ED Diagnoses   Final diagnoses:  None    ED Discharge Orders    None       Cherre Robins, PA-C 06/16/19 2324    Maudie Flakes, MD 06/18/19 [phone]

## 2019-06-17 DIAGNOSIS — J9621 Acute and chronic respiratory failure with hypoxia: Secondary | ICD-10-CM | POA: Diagnosis present

## 2019-06-17 DIAGNOSIS — Z881 Allergy status to other antibiotic agents status: Secondary | ICD-10-CM | POA: Diagnosis not present

## 2019-06-17 DIAGNOSIS — Z833 Family history of diabetes mellitus: Secondary | ICD-10-CM | POA: Diagnosis not present

## 2019-06-17 DIAGNOSIS — G4733 Obstructive sleep apnea (adult) (pediatric): Secondary | ICD-10-CM

## 2019-06-17 DIAGNOSIS — Z7984 Long term (current) use of oral hypoglycemic drugs: Secondary | ICD-10-CM | POA: Diagnosis not present

## 2019-06-17 DIAGNOSIS — Z23 Encounter for immunization: Secondary | ICD-10-CM | POA: Diagnosis not present

## 2019-06-17 DIAGNOSIS — T502X5A Adverse effect of carbonic-anhydrase inhibitors, benzothiadiazides and other diuretics, initial encounter: Secondary | ICD-10-CM | POA: Diagnosis not present

## 2019-06-17 DIAGNOSIS — I272 Pulmonary hypertension, unspecified: Secondary | ICD-10-CM

## 2019-06-17 DIAGNOSIS — Z930 Tracheostomy status: Secondary | ICD-10-CM | POA: Diagnosis not present

## 2019-06-17 DIAGNOSIS — J1289 Other viral pneumonia: Secondary | ICD-10-CM | POA: Diagnosis present

## 2019-06-17 DIAGNOSIS — Z8249 Family history of ischemic heart disease and other diseases of the circulatory system: Secondary | ICD-10-CM | POA: Diagnosis not present

## 2019-06-17 DIAGNOSIS — Z6841 Body Mass Index (BMI) 40.0 and over, adult: Secondary | ICD-10-CM | POA: Diagnosis not present

## 2019-06-17 DIAGNOSIS — E662 Morbid (severe) obesity with alveolar hypoventilation: Secondary | ICD-10-CM

## 2019-06-17 DIAGNOSIS — E876 Hypokalemia: Secondary | ICD-10-CM | POA: Diagnosis not present

## 2019-06-17 DIAGNOSIS — G894 Chronic pain syndrome: Secondary | ICD-10-CM | POA: Diagnosis present

## 2019-06-17 DIAGNOSIS — I5032 Chronic diastolic (congestive) heart failure: Secondary | ICD-10-CM | POA: Diagnosis present

## 2019-06-17 DIAGNOSIS — J9611 Chronic respiratory failure with hypoxia: Secondary | ICD-10-CM | POA: Diagnosis present

## 2019-06-17 DIAGNOSIS — E119 Type 2 diabetes mellitus without complications: Secondary | ICD-10-CM | POA: Diagnosis present

## 2019-06-17 DIAGNOSIS — I110 Hypertensive heart disease with heart failure: Secondary | ICD-10-CM | POA: Diagnosis present

## 2019-06-17 DIAGNOSIS — I10 Essential (primary) hypertension: Secondary | ICD-10-CM

## 2019-06-17 DIAGNOSIS — Z91013 Allergy to seafood: Secondary | ICD-10-CM | POA: Diagnosis not present

## 2019-06-17 DIAGNOSIS — Z79899 Other long term (current) drug therapy: Secondary | ICD-10-CM | POA: Diagnosis not present

## 2019-06-17 DIAGNOSIS — U071 COVID-19: Secondary | ICD-10-CM | POA: Diagnosis present

## 2019-06-17 DIAGNOSIS — Z880 Allergy status to penicillin: Secondary | ICD-10-CM | POA: Diagnosis not present

## 2019-06-17 DIAGNOSIS — E118 Type 2 diabetes mellitus with unspecified complications: Secondary | ICD-10-CM

## 2019-06-17 DIAGNOSIS — R05 Cough: Secondary | ICD-10-CM | POA: Diagnosis present

## 2019-06-17 DIAGNOSIS — K219 Gastro-esophageal reflux disease without esophagitis: Secondary | ICD-10-CM | POA: Diagnosis present

## 2019-06-17 LAB — CBC WITH DIFFERENTIAL/PLATELET
Abs Immature Granulocytes: 0.02 10*3/uL (ref 0.00–0.07)
Basophils Absolute: 0 10*3/uL (ref 0.0–0.1)
Basophils Relative: 1 %
Eosinophils Absolute: 0 10*3/uL (ref 0.0–0.5)
Eosinophils Relative: 0 %
HCT: 40 % (ref 39.0–52.0)
Hemoglobin: 12.3 g/dL — ABNORMAL LOW (ref 13.0–17.0)
Immature Granulocytes: 1 %
Lymphocytes Relative: 33 %
Lymphs Abs: 1.3 10*3/uL (ref 0.7–4.0)
MCH: 29.1 pg (ref 26.0–34.0)
MCHC: 30.8 g/dL (ref 30.0–36.0)
MCV: 94.6 fL (ref 80.0–100.0)
Monocytes Absolute: 0.5 10*3/uL (ref 0.1–1.0)
Monocytes Relative: 13 %
Neutro Abs: 2.2 10*3/uL (ref 1.7–7.7)
Neutrophils Relative %: 52 %
Platelets: 162 10*3/uL (ref 150–400)
RBC: 4.23 MIL/uL (ref 4.22–5.81)
RDW: 14.3 % (ref 11.5–15.5)
WBC: 4.1 10*3/uL (ref 4.0–10.5)
nRBC: 0 % (ref 0.0–0.2)

## 2019-06-17 LAB — COMPREHENSIVE METABOLIC PANEL
ALT: 19 U/L (ref 0–44)
AST: 26 U/L (ref 15–41)
Albumin: 3.5 g/dL (ref 3.5–5.0)
Alkaline Phosphatase: 91 U/L (ref 38–126)
Anion gap: 10 (ref 5–15)
BUN: 19 mg/dL (ref 6–20)
CO2: 32 mmol/L (ref 22–32)
Calcium: 8.7 mg/dL — ABNORMAL LOW (ref 8.9–10.3)
Chloride: 98 mmol/L (ref 98–111)
Creatinine, Ser: 1.08 mg/dL (ref 0.61–1.24)
GFR calc Af Amer: >60 mL/min (ref >60)
GFR calc non Af Amer: >60 mL/min (ref >60)
Glucose, Bld: 126 mg/dL — ABNORMAL HIGH (ref 70–99)
Potassium: 3.5 mmol/L (ref 3.5–5.1)
Sodium: 140 mmol/L (ref 135–145)
Total Bilirubin: 0.4 mg/dL (ref 0.3–1.2)
Total Protein: 7.3 g/dL (ref 6.5–8.1)

## 2019-06-17 LAB — FERRITIN: Ferritin: 216 ng/mL (ref 24–336)

## 2019-06-17 LAB — MAGNESIUM: Magnesium: 1.9 mg/dL (ref 1.7–2.4)

## 2019-06-17 LAB — PROCALCITONIN: Procalcitonin: <0.1 ng/mL

## 2019-06-17 LAB — HEMOGLOBIN A1C
Hgb A1c MFr Bld: 7.1 % — ABNORMAL HIGH (ref 4.8–5.6)
Mean Plasma Glucose: 157.07 mg/dL

## 2019-06-17 LAB — ABO/RH: ABO/RH(D): B POS

## 2019-06-17 LAB — GLUCOSE, CAPILLARY
Glucose-Capillary: 140 mg/dL — ABNORMAL HIGH (ref 70–99)
Glucose-Capillary: 152 mg/dL — ABNORMAL HIGH (ref 70–99)
Glucose-Capillary: 210 mg/dL — ABNORMAL HIGH (ref 70–99)
Glucose-Capillary: 214 mg/dL — ABNORMAL HIGH (ref 70–99)

## 2019-06-17 LAB — CBG MONITORING, ED: Glucose-Capillary: 140 mg/dL — ABNORMAL HIGH (ref 70–99)

## 2019-06-17 LAB — BRAIN NATRIURETIC PEPTIDE: B Natriuretic Peptide: 14.2 pg/mL (ref 0.0–100.0)

## 2019-06-17 LAB — PHOSPHORUS: Phosphorus: 3.7 mg/dL (ref 2.5–4.6)

## 2019-06-17 LAB — LACTATE DEHYDROGENASE: LDH: 188 U/L (ref 98–192)

## 2019-06-17 LAB — C-REACTIVE PROTEIN: CRP: 3 mg/dL — ABNORMAL HIGH (ref <1.0)

## 2019-06-17 LAB — D-DIMER, QUANTITATIVE: D-Dimer, Quant: 1.2 ug/mL-FEU — ABNORMAL HIGH (ref 0.00–0.50)

## 2019-06-17 MED ORDER — METOPROLOL TARTRATE 50 MG PO TABS
50.0000 mg | ORAL_TABLET | Freq: Two times a day (BID) | ORAL | Status: DC
  Administered 2019-06-17 – 2019-06-20 (×8): 50 mg via ORAL
  Filled 2019-06-17 (×9): qty 1

## 2019-06-17 MED ORDER — FAMOTIDINE 20 MG PO TABS
20.0000 mg | ORAL_TABLET | Freq: Every day | ORAL | Status: DC
  Administered 2019-06-17 – 2019-06-21 (×5): 20 mg via ORAL
  Filled 2019-06-17 (×5): qty 1

## 2019-06-17 MED ORDER — LORATADINE 10 MG PO TABS
10.0000 mg | ORAL_TABLET | Freq: Every day | ORAL | Status: DC | PRN
  Administered 2019-06-17: 10 mg via ORAL
  Filled 2019-06-17: qty 1

## 2019-06-17 MED ORDER — GUAIFENESIN-DM 100-10 MG/5ML PO SYRP
10.0000 mL | ORAL_SOLUTION | ORAL | Status: DC | PRN
  Administered 2019-06-21: 10 mL via ORAL
  Filled 2019-06-17 (×2): qty 10

## 2019-06-17 MED ORDER — ENOXAPARIN SODIUM 40 MG/0.4ML ~~LOC~~ SOLN
40.0000 mg | SUBCUTANEOUS | Status: DC
  Administered 2019-06-17: 40 mg via SUBCUTANEOUS
  Filled 2019-06-17: qty 0.4

## 2019-06-17 MED ORDER — ACETAMINOPHEN 325 MG PO TABS: 650.0000 mg | ORAL_TABLET | Freq: Four times a day (QID) | ORAL | Status: DC | PRN

## 2019-06-17 MED ORDER — CLINDAMYCIN PHOSPHATE 1 % EX GEL
1.0000 "application " | Freq: Every day | CUTANEOUS | Status: DC
  Administered 2019-06-17 – 2019-06-21 (×5): 1 via TOPICAL
  Filled 2019-06-17: qty 30

## 2019-06-17 MED ORDER — ONDANSETRON HCL 4 MG PO TABS
4.0000 mg | ORAL_TABLET | Freq: Four times a day (QID) | ORAL | Status: DC | PRN
  Administered 2019-06-20: 4 mg via ORAL
  Filled 2019-06-17: qty 1

## 2019-06-17 MED ORDER — FUROSEMIDE 40 MG PO TABS
160.0000 mg | ORAL_TABLET | Freq: Two times a day (BID) | ORAL | Status: DC
  Administered 2019-06-17 – 2019-06-21 (×8): 160 mg via ORAL
  Filled 2019-06-17: qty 8
  Filled 2019-06-17: qty 4
  Filled 2019-06-17 (×2): qty 8
  Filled 2019-06-17: qty 4
  Filled 2019-06-17: qty 8
  Filled 2019-06-17: qty 4
  Filled 2019-06-17: qty 8
  Filled 2019-06-17: qty 4
  Filled 2019-06-17: qty 8
  Filled 2019-06-17: qty 4

## 2019-06-17 MED ORDER — GABAPENTIN 300 MG PO CAPS
600.0000 mg | ORAL_CAPSULE | Freq: Once | ORAL | Status: AC
  Administered 2019-06-17: 600 mg via ORAL
  Filled 2019-06-17: qty 2

## 2019-06-17 MED ORDER — HYDROCOD POLST-CPM POLST ER 10-8 MG/5ML PO SUER: 5.0000 mL | Freq: Two times a day (BID) | ORAL | Status: DC | PRN

## 2019-06-17 MED ORDER — ONDANSETRON HCL 4 MG/2ML IJ SOLN
4.0000 mg | Freq: Four times a day (QID) | INTRAMUSCULAR | Status: DC | PRN
  Administered 2019-06-19: 4 mg via INTRAVENOUS
  Filled 2019-06-17: qty 2

## 2019-06-17 MED ORDER — SALINE SPRAY 0.65 % NA SOLN
1.0000 | NASAL | Status: DC | PRN
  Administered 2019-06-17: 1 via NASAL
  Filled 2019-06-17: qty 44

## 2019-06-17 MED ORDER — SODIUM CHLORIDE 0.9% FLUSH
3.0000 mL | Freq: Two times a day (BID) | INTRAVENOUS | Status: DC
  Administered 2019-06-17 – 2019-06-21 (×9): 3 mL via INTRAVENOUS

## 2019-06-17 MED ORDER — INSULIN ASPART 100 UNIT/ML ~~LOC~~ SOLN
0.0000 [IU] | SUBCUTANEOUS | Status: DC
  Administered 2019-06-17 – 2019-06-18 (×3): 7 [IU] via SUBCUTANEOUS
  Administered 2019-06-18: 4 [IU] via SUBCUTANEOUS
  Administered 2019-06-18: 7 [IU] via SUBCUTANEOUS
  Administered 2019-06-18: 3 [IU] via SUBCUTANEOUS
  Administered 2019-06-18: 4 [IU] via SUBCUTANEOUS

## 2019-06-17 MED ORDER — SODIUM CHLORIDE 0.9% FLUSH: 3.0000 mL | INTRAVENOUS | Status: DC | PRN

## 2019-06-17 MED ORDER — ALLOPURINOL 100 MG PO TABS
300.0000 mg | ORAL_TABLET | Freq: Every day | ORAL | Status: DC
  Administered 2019-06-17 – 2019-06-21 (×5): 300 mg via ORAL
  Filled 2019-06-17 (×5): qty 3

## 2019-06-17 MED ORDER — INFLUENZA VAC SPLIT QUAD 0.5 ML IM SUSY
0.5000 mL | PREFILLED_SYRINGE | INTRAMUSCULAR | Status: DC
  Filled 2019-06-17: qty 0.5

## 2019-06-17 MED ORDER — SODIUM CHLORIDE 0.9 % IV SOLN
200.0000 mg | Freq: Once | INTRAVENOUS | Status: AC
  Administered 2019-06-17: 200 mg via INTRAVENOUS
  Filled 2019-06-17: qty 40

## 2019-06-17 MED ORDER — INSULIN ASPART 100 UNIT/ML ~~LOC~~ SOLN
0.0000 [IU] | Freq: Three times a day (TID) | SUBCUTANEOUS | Status: DC
  Administered 2019-06-17: 2 [IU] via SUBCUTANEOUS
  Administered 2019-06-17: 3 [IU] via SUBCUTANEOUS

## 2019-06-17 MED ORDER — DEXAMETHASONE SODIUM PHOSPHATE 10 MG/ML IJ SOLN
6.0000 mg | INTRAMUSCULAR | Status: DC
  Administered 2019-06-17 – 2019-06-21 (×5): 6 mg via INTRAVENOUS
  Filled 2019-06-17 (×5): qty 1

## 2019-06-17 MED ORDER — ZINC SULFATE 220 (50 ZN) MG PO CAPS
220.0000 mg | ORAL_CAPSULE | Freq: Every day | ORAL | Status: DC
  Administered 2019-06-17 – 2019-06-21 (×5): 220 mg via ORAL
  Filled 2019-06-17 (×5): qty 1

## 2019-06-17 MED ORDER — SODIUM CHLORIDE 0.9 % IV SOLN
100.0000 mg | INTRAVENOUS | Status: AC
  Administered 2019-06-18 – 2019-06-21 (×4): 100 mg via INTRAVENOUS
  Filled 2019-06-17 (×4): qty 100

## 2019-06-17 MED ORDER — VITAMIN C 500 MG PO TABS
500.0000 mg | ORAL_TABLET | Freq: Every day | ORAL | Status: DC
  Administered 2019-06-17 – 2019-06-21 (×5): 500 mg via ORAL
  Filled 2019-06-17 (×5): qty 1

## 2019-06-17 MED ORDER — SODIUM CHLORIDE 0.9 % IV SOLN: 250.0000 mL | INTRAVENOUS | Status: DC | PRN

## 2019-06-17 MED ORDER — GABAPENTIN 300 MG PO CAPS
1200.0000 mg | ORAL_CAPSULE | Freq: Two times a day (BID) | ORAL | Status: DC
  Administered 2019-06-17 – 2019-06-21 (×9): 1200 mg via ORAL
  Filled 2019-06-17 (×9): qty 4

## 2019-06-17 MED ORDER — GUAIFENESIN ER 600 MG PO TB12
600.0000 mg | ORAL_TABLET | Freq: Two times a day (BID) | ORAL | Status: DC
  Administered 2019-06-17 – 2019-06-21 (×9): 600 mg via ORAL
  Filled 2019-06-17 (×9): qty 1

## 2019-06-17 MED ORDER — ENOXAPARIN SODIUM 100 MG/ML ~~LOC~~ SOLN
90.0000 mg | SUBCUTANEOUS | Status: DC
  Administered 2019-06-17 – 2019-06-20 (×4): 90 mg via SUBCUTANEOUS
  Filled 2019-06-17 (×5): qty 1

## 2019-06-17 NOTE — Progress Notes (Signed)
Remdesivir - Pharmacy Brief Note   O:  ALT: 19 CXR: Mild lingular/left lower lobe opacity, atelectasis versus pneumonia. SpO2: 98% on HFNC 8L   A/P:  Remdesivir 200 mg once followed by 100 mg daily x 4 days.   Gretta Arab PharmD, BCPS Clinical pharmacist phone 7am- 5pm: [phone] 06/17/2019 1:13 PM

## 2019-06-17 NOTE — Progress Notes (Signed)
Pt placed on 7 lpm salter for sleep.  HME with in line suction available at bedside.  Pt has a PMV in place and felt more comfortable with salter at this time.

## 2019-06-17 NOTE — Progress Notes (Signed)
PROGRESS NOTE    Derrick [name]  ZOX:[id number] DOB: 1981-07-13 DOA: 06/16/2019 PCP: Myrlene Broker, MD   Brief Narrative:   38 y.o. BM  PMHx   chronic respiratory failure with hypoxia, osa, obesity hypoventilation syndrome S/P PERMANENT TRACH , diabetes type 2 controlled with complication HTN, chronic diastolic CHF pulmonary hypertension.  Comes in for 5 days of loss of smell/taste, sob, cough, diarrhea, fatigue.  His mom has been sick with covid.  Sob worsened and came to ed found to be Hypoxic at 87% on RA.  No home o2 supplemental needs.  cxr with questionable atelectasis vs small infiltrate.  Referred for admission for covid infection.   Subjective: A/O x4, negative abdominal pain, negative nausea/vomiting, positive S OB.  Currently speaking with PMV in place.  Usually on 2 L O2 via [place] at home   Assessment & Plan:   Principal Problem:   COVID-19 virus infection Active Problems:   Morbid obesity (HCC)   Essential hypertension   OSA (obstructive sleep apnea)   Obesity hypoventilation syndrome (HCC)   Chronic diastolic heart failure (HCC)   Pulmonary hypertension (HCC)   Diabetes mellitus type 2 with complications (HCC)   Tracheostomy dependence (HCC)   Pneumonia   Chronic respiratory failure with hypoxia (HCC)   Acute on chronic respiratory failure with hypoxia (HCC)   Chronic pain syndrome  Covid pneumonia/acute on chronic respiratory failure with hypoxia/tracheostomy dependent COVID-19 Labs  Recent Labs    06/17/19 0558  DDIMER 1.20*  FERRITIN 216  LDH 188  CRP 3.0*    Lab Results  Component Value Date   SARSCOV2NAA POSITIVE (A) 06/16/2019   SARSCOV2NAA NEGATIVE 06/03/2019   SARSCOV2NAA NEGATIVE 02/11/2019  -Decadron 6 mg daily -Remdesivir per pharmacy -Combivent -Flutter valve -Incentive spirometry -Titrate O2 to maintain SPO2> 88% -Covid vitamins per protocol -Counseled patient that currently no need for Actemra however if his  respiratory status continues to deteriorate would start Actemra. -Counseled patient that Covid convalescent plasma is not a proven therapy for COVID-19, though all available data shows it may have some benefit. -Asked patient if we needed to could restart Actemra or Covid convalescent plasma, patient agreed.,  OSA/OHS -See Covid pneumonia  Pulmonary hypertension -See CHF  Chronic diastolic CHF -Base weight? -Strict in and out -Daily weight -Lasix 160 mg BID; may want to change IV  -Metoprolol 50 mg BID  Diabetes type 2 controlled with complication -9/1 Hemoglobin A1c= 7.0 -Resistant SSI  Morbid obesity -nutrition consult.  Place patient on 2000 -calorie/day diet while hospitalized  Chronic pain syndrome -Gabapentin 1200 mg BID (home dose)   DVT prophylaxis: Lovenox Code Status: Full Family Communication: 11/17 spoke with mother while examining patient counseled her on plan of care answered all questions Disposition Plan: TBD   Consultants:    Procedures/Significant Events:     I have personally reviewed and interpreted all radiology studies and my findings are as above.  VENTILATOR SETTINGS: HFNC Flow; 8 L/min SPO2; 98%   Cultures 11/16 SARS coronavirus positive    Antimicrobials: Anti-infectives (From admission, onward)   Start     Stop   06/18/19 1000  remdesivir 100 mg in sodium chloride 0.9 % 250 mL IVPB     06/22/19 0959   06/17/19 1600  remdesivir 200 mg in sodium chloride 0.9 % 250 mL IVPB         06/16/19 2100  cefTRIAXone (ROCEPHIN) 1 g in sodium chloride 0.9 % 100 mL IVPB     06/16/19 2211  Devices    LINES / TUBES:      Continuous Infusions: . sodium chloride       Objective: Vitals:   06/17/19 0500 06/17/19 0606 06/17/19 0720 06/17/19 1157  BP: (!) 146/89  131/83   Pulse: 86  95 65  Resp: (!) 25  18 (!) 22  Temp: 98.6 F (37 C)  98.7 F (37.1 C)   TempSrc: Oral  Oral   SpO2: 91% 96% 95% 98%  Weight: (!) 186.9  kg     Height: 5\' 10"  (1.778 m)       Intake/Output Summary (Last 24 hours) at 06/17/2019 1300 Last data filed at 06/17/2019 0932 Gross per 24 hour  Intake 300 ml  Output 1500 ml  Net -1200 ml   Filed Weights   06/17/19 0500  Weight: (!) 186.9 kg    Examination:  General: A/O x4, positive acute on chronic respiratory distress (normally on 2 L O2 via [place] at home) Eyes: negative scleral hemorrhage, negative anisocoria, negative icterus ENT: Negative Runny nose, negative gingival bleeding, Neck:  Negative scars, masses, torticollis, lymphadenopathy, JVD Lungs: Difficult to auscultate secondary to body habitus but appears decreased breath sounds with mild expiratory wheezing  Cardiovascular: Regular rate and rhythm without murmur gallop or rub normal S1 and S2 Abdomen: MORBIDLY OBESE negative abdominal pain, nondistended, positive soft, bowel sounds, no rebound, no ascites, no appreciable mass Extremities: No significant cyanosis, clubbing, or edema bilateral lower extremities Skin: Negative rashes, lesions, ulcers Psychiatric:  Negative depression, negative anxiety, negative fatigue, negative mania  Central nervous system:  Cranial nerves II through XII intact, tongue/uvula midline, all extremities muscle strength 5/5, sensation intact throughout,  negative dysarthria, negative expressive aphasia, negative receptive aphasia.  .     Data Reviewed: Care during the described time interval was provided by me .  I have reviewed this patient's available data, including medical history, events of note, physical examination, and all test results as part of my evaluation.   CBC: Recent Labs  Lab 06/16/19 1618 06/17/19 0558  WBC 4.5 4.1  NEUTROABS 2.5 2.2  HGB 12.9* 12.3*  HCT 41.9 40.0  MCV 94.4 94.6  PLT 167 546   Basic Metabolic Panel: Recent Labs  Lab 06/16/19 1618 06/17/19 0558  NA 137 140  K 3.6 3.5  CL 99 98  CO2 28 32  GLUCOSE 123* 126*  BUN 17 19  CREATININE 1.08  1.08  CALCIUM 8.3* 8.7*  MG  --  1.9  PHOS  --  3.7   GFR: Estimated Creatinine Clearance: 157.1 mL/min (by C-G formula based on SCr of 1.08 mg/dL). Liver Function Tests: Recent Labs  Lab 06/16/19 1618 06/17/19 0558  AST 26 26  ALT 20 19  ALKPHOS 97 91  BILITOT 0.6 0.4  PROT 7.7 7.3  ALBUMIN 3.6 3.5   No results for input(s): LIPASE, AMYLASE in the last 168 hours. No results for input(s): AMMONIA in the last 168 hours. Coagulation Profile: No results for input(s): INR, PROTIME in the last 168 hours. Cardiac Enzymes: No results for input(s): CKTOTAL, CKMB, CKMBINDEX, TROPONINI in the last 168 hours. BNP (last 3 results) Recent Labs    12/27/18 1423 05/30/19 1530  PROBNP 17.0 19.0   HbA1C: No results for input(s): HGBA1C in the last 72 hours. CBG: Recent Labs  Lab 06/17/19 0036 06/17/19 0527 06/17/19 0745 06/17/19 1136  GLUCAP 140* 140* 152* 210*   Lipid Profile: No results for input(s): CHOL, HDL, LDLCALC, TRIG, CHOLHDL, LDLDIRECT in the  last 72 hours. Thyroid Function Tests: No results for input(s): TSH, T4TOTAL, FREET4, T3FREE, THYROIDAB in the last 72 hours. Anemia Panel: Recent Labs    06/17/19 0558  FERRITIN 216   Urine analysis:    Component Value Date/Time   COLORURINE YELLOW 06/20/2015 2145   APPEARANCEUR CLOUDY (A) 06/20/2015 2145   LABSPEC 1.030 06/20/2015 2145   PHURINE 7.5 06/20/2015 2145   GLUCOSEU NEGATIVE 06/20/2015 2145   HGBUR MODERATE (A) 06/20/2015 2145   BILIRUBINUR NEGATIVE 06/20/2015 2145   KETONESUR NEGATIVE 06/20/2015 2145   PROTEINUR >300 (A) 06/20/2015 2145   UROBILINOGEN 0.2 06/12/2015 2105   NITRITE NEGATIVE 06/20/2015 2145   LEUKOCYTESUR TRACE (A) 06/20/2015 2145   Sepsis Labs: @LABRCNTIP (procalcitonin:4,lacticidven:4)  ) Recent Results (from the past 240 hour(s))  SARS CORONAVIRUS 2 (TAT 6-24 HRS) Nasopharyngeal Nasopharyngeal Swab     Status: Abnormal   Collection Time: 06/16/19  4:18 PM   Specimen:  Nasopharyngeal Swab  Result Value Ref Range Status   SARS Coronavirus 2 POSITIVE (A) NEGATIVE Final    Comment: RESULT CALLED TO, READ BACK BY AND VERIFIED WITH: L.ADKINES RN [phone] 06/16/2019 MCCORMICK K (NOTE) SARS-CoV-2 target nucleic acids are DETECTED. The SARS-CoV-2 RNA is generally detectable in upper and lower respiratory specimens during the acute phase of infection. Positive results are indicative of active infection with SARS-CoV-2. Clinical  correlation with patient history and other diagnostic information is necessary to determine patient infection status. Positive results do  not rule out bacterial infection or co-infection with other viruses. The expected result is Negative. Fact Sheet for Patients: 06/18/2019 Fact Sheet for Healthcare Providers: HairSlick.no This test is not yet approved or cleared by the quierodirigir.com FDA and  has been authorized for detection and/or diagnosis of SARS-CoV-2 by FDA under an Emergency Use Authorization (EUA). This EUA will remain  in effect (meaning this test can be used)  for the duration of the COVID-19 declaration under Section 564(b)(1) of the Act, 21 U.S.C. section 360bbb-3(b)(1), unless the authorization is terminated or revoked sooner. Performed at [facility], [address] [place], [place]          Radiology Studies: Dg Chest Portable 1 View  Result Date: 06/16/2019 CLINICAL DATA:  Productive cough, shortness of breath weakness, loss of smell/taste, diarrhea EXAM: PORTABLE CHEST 1 VIEW COMPARISON:  12/28/2018 FINDINGS: Mild lingular/left lower lobe opacity, atelectasis versus pneumonia. Additional perihilar opacities are possible bilaterally, versus pulmonary vascular congestion. No definite pleural effusions. No pneumothorax. Cardiomegaly. Tracheostomy, in satisfactory position. IMPRESSION: Mild lingular/left lower lobe opacity, atelectasis  versus pneumonia. Electronically Signed   By: 12/30/2018 M.D.   On: 06/16/2019 15:58        Scheduled Meds: . dexamethasone (DECADRON) injection  6 mg Intravenous Q24H  . enoxaparin (LOVENOX) injection  90 mg Subcutaneous Q24H  . [START ON 06/18/2019] influenza vac split quadrivalent PF  0.5 mL Intramuscular Tomorrow-1000  . insulin aspart  0-9 Units Subcutaneous TID WC  . sodium chloride flush  3 mL Intravenous Q12H  . vitamin C  500 mg Oral Daily  . zinc sulfate  220 mg Oral Daily   Continuous Infusions: . sodium chloride       LOS: 0 days   The patient is critically ill with multiple organ systems failure and requires high complexity decision making for assessment and support, frequent evaluation and titration of therapies, application of advanced monitoring technologies and extensive interpretation of multiple databases. Critical Care Time devoted to patient care services  described in this note  Time spent: 40 minutes    Kamarri Fischetti, Roselind MessierURTIS J, MD [facility] Pager [phone]  If 7PM-7AM, please contact night-coverage [email or web address] Password [email or web address] 06/17/2019, 1:00 PM

## 2019-06-17 NOTE — H&P (Signed)
History and Physical    Derrick [name]:[id number] DOB: [birth year] DOA: 06/16/2019  PCP: Hoyt Koch, MD  Patient coming from:  home  Chief Complaint:  Diarrhea, sob, cough, fatigue  HPI: Derrick [name] is [name] 38 y.o. male with medical history significant of osa, obesity hypoventilation syndrome s/p perm trach, dm, htn, chronic diastolic chf comes in for 5 days of loss of smell/taste, sob, cough, diarrhea, fatigue.  His mom has been sick with covid.  Sob worsened and came to ed found to be ypoxic at 87% on RA.  No home o2 supplemental needs.  cxr with questionable atelectasis vs small infiltrate.  Referred for admission for covid infection.  Review of Systems: As per HPI otherwise 10 point review of systems negative.   Past Medical History:  Diagnosis Date   Acute on chronic diastolic CHF (congestive heart failure), NYHA class 1 (HCC)    Diabetes mellitus without complication (HCC)    GERD (gastroesophageal reflux disease)    Hypertension    Hypoxemia    Morbid obesity ([place])    Obesity hypoventilation syndrome (HCC)    OSA (obstructive sleep apnea)    Reflux     Past Surgical History:  Procedure Laterality Date   TRACHEOSTOMY TUBE PLACEMENT N/[name] 06/21/2015   Procedure: TRACHEOSTOMY;  Surgeon: Leta Baptist, MD;  Location: [facility] OR;  Service: ENT;  Laterality: N/[name];     reports that he has never smoked. He has never used smokeless tobacco. He reports that he does not drink alcohol or use drugs.  Allergies  Allergen Reactions   Shrimp [Shellfish Allergy]    Vancomycin Other (See Comments)   Zosyn [Piperacillin Sod-Tazobactam So] Other (See Comments)    Family History  Problem Relation Age of Onset   Diabetes Maternal Grandmother    Hypertension Maternal Grandmother    Asthma Other    Cancer Other    Stroke Other    Heart disease Other     Prior to Admission medications   Medication Sig Start Date End Date Taking? Authorizing Provider    albuterol (PROVENTIL HFA;VENTOLIN HFA) 108 (90 Base) MCG/ACT inhaler Inhale 2 puffs into the lungs every 6 (six) hours as needed for wheezing or shortness of breath. 07/30/18   Hoyt Koch, MD  allopurinol (ZYLOPRIM) 300 MG tablet Take 1 tablet (300 mg total) by mouth daily. 06/11/19   Marrian Salvage, FNP  Blood Glucose Monitoring Suppl (BAYER CONTOUR NEXT MONITOR) w/Device KIT Use to check blood sugars daily Dx E11.9 10/16/16   Hoyt Koch, MD  doxycycline (VIBRA-TABS) 100 MG tablet Take 1 tablet (100 mg total) by mouth 2 (two) times daily. 06/11/19   Marrian Salvage, FNP  famotidine (PEPCID) 20 MG tablet Take 1 tablet (20 mg total) by mouth daily. 05/30/19   Hoyt Koch, MD  fluticasone Asencion Islam) 50 MCG/ACT nasal spray Place 2 sprays into both nostrils daily. 06/11/19   Marrian Salvage, FNP  fluticasone (FLOVENT HFA) 110 MCG/ACT inhaler Inhale 2 puffs into the lungs 2 (two) times daily. 07/30/18   Hoyt Koch, MD  furosemide (LASIX) 80 MG tablet Take 1 tablet (80 mg total) by mouth 4 (four) times daily. 05/30/19   Hoyt Koch, MD  gabapentin (NEURONTIN) 300 MG capsule TAKE 2 CAPSULES BY MOUTH 4 TIMES DAILY 06/11/19   Marrian Salvage, FNP  glipiZIDE (GLUCOTROL XL) 10 MG 24 hr tablet Take 1 tablet (10 mg total) by mouth daily with breakfast. 11/27/17   Sharlet Salina,  Real Cons, MD  glucose blood test strip Use as instructed 11/30/16   Hoyt Koch, MD  guaiFENesin (MUCINEX) 600 MG 12 hr tablet Take 1 tablet (600 mg total) by mouth 2 (two) times daily. 09/18/16   Hoyt Koch, MD  JANUVIA 100 MG tablet Take 1 tablet by mouth once daily 01/02/19   Hoyt Koch, MD  levocetirizine (XYZAL) 5 MG tablet TAKE 1 TABLET BY MOUTH ONCE DAILY IN THE EVENING 01/14/19   Hoyt Koch, MD  metoprolol tartrate (LOPRESSOR) 50 MG tablet Take 1 tablet by mouth twice daily 03/13/19   Hoyt Koch, MD   montelukast (SINGULAIR) 10 MG tablet Take 1 tablet (10 mg total) by mouth at bedtime. 04/09/18   Hoyt Koch, MD  [facility] DELICA LANCETS 60A MISC Use as needed daily 11/30/16   Hoyt Koch, MD  pioglitazone (ACTOS) 30 MG tablet Take 1 tablet by mouth once daily 01/02/19   Hoyt Koch, MD  potassium chloride SA (K-DUR) 20 MEQ tablet Take 1 tablet (20 mEq total) by mouth 3 (three) times daily. 04/03/19   Hoyt Koch, MD  sildenafil (VIAGRA) 100 MG tablet TAKE 1/2 TO 1 (ONE-HALF TO ONE) TABLET BY MOUTH ONCE DAILY AS NEEDED FOR ERECTILE DYSFUNCTION 04/18/19   Hoyt Koch, MD  Tracheostomy Care KIT 30 kits by Does not apply route daily. 09/27/15   Erick Colace, NP    Physical Exam: Vitals:   06/17/19 0230 06/17/19 0250 06/17/19 0310 06/17/19 0325  BP:      Pulse: 96 96 96 94  Resp: (!) 23 (!) 27 17 (!) 25  Temp:      TempSrc:      SpO2: 92% 94% 95% 90%      Constitutional: NAD, calm, comfortable Vitals:   06/17/19 0230 06/17/19 0250 06/17/19 0310 06/17/19 0325  BP:      Pulse: 96 96 96 94  Resp: (!) 23 (!) 27 17 (!) 25  Temp:      TempSrc:      SpO2: 92% 94% 95% 90%   Eyes: PERRL, lids and conjunctivae normal ENMT: Mucous membranes are moist. Posterior pharynx clear of any exudate or lesions.Normal dentition. Trach c/d/i Neck: normal, supple, no masses, no thyromegaly Respiratory: clear to auscultation bilaterally, no wheezing, no crackles. Normal respiratory effort. No accessory muscle use.  Cardiovascular: Regular rate and rhythm, no murmurs / rubs / gallops. No extremity edema. 2+ pedal pulses. No carotid bruits.  Abdomen: no tenderness, no masses palpated. No hepatosplenomegaly. Bowel sounds positive.  Musculoskeletal: no clubbing / cyanosis. No joint deformity upper and lower extremities. Good ROM, no contractures. Normal muscle tone.  Skin: no rashes, lesions, ulcers. No induration Neurologic: CN 2-12 grossly intact. Sensation  intact, DTR normal. Strength 5/5 in all 4.  Psychiatric: Normal judgment and insight. Alert and oriented x 3. Normal mood.    Labs on Admission: I have personally reviewed following labs and imaging studies  CBC: Recent Labs  Lab 06/16/19 1618  WBC 4.5  NEUTROABS 2.5  HGB 12.9*  HCT 41.9  MCV 94.4  PLT 004   Basic Metabolic Panel: Recent Labs  Lab 06/16/19 1618  NA 137  K 3.6  CL 99  CO2 28  GLUCOSE 123*  BUN 17  CREATININE 1.08  CALCIUM 8.3*   GFR: CrCl cannot be calculated (Unknown ideal weight.). Liver Function Tests: Recent Labs  Lab 06/16/19 1618  AST 26  ALT 20  ALKPHOS 97  BILITOT 0.6  PROT 7.7  ALBUMIN 3.6   No results for input(s): LIPASE, AMYLASE in the last 168 hours. No results for input(s): AMMONIA in the last 168 hours. Coagulation Profile: No results for input(s): INR, PROTIME in the last 168 hours. Cardiac Enzymes: No results for input(s): CKTOTAL, CKMB, CKMBINDEX, TROPONINI in the last 168 hours. BNP (last 3 results) Recent Labs    12/27/18 1423 05/30/19 1530  PROBNP 17.0 19.0   HbA1C: No results for input(s): HGBA1C in the last 72 hours. CBG: Recent Labs  Lab 06/17/19 0036  GLUCAP 140*   Lipid Profile: No results for input(s): CHOL, HDL, LDLCALC, TRIG, CHOLHDL, LDLDIRECT in the last 72 hours. Thyroid Function Tests: No results for input(s): TSH, T4TOTAL, FREET4, T3FREE, THYROIDAB in the last 72 hours. Anemia Panel: No results for input(s): VITAMINB12, FOLATE, FERRITIN, TIBC, IRON, RETICCTPCT in the last 72 hours. Urine analysis:    Component Value Date/Time   COLORURINE YELLOW 06/20/2015 2145   APPEARANCEUR CLOUDY ([name]) 06/20/2015 2145   LABSPEC 1.030 06/20/2015 2145   PHURINE 7.5 06/20/2015 2145   GLUCOSEU NEGATIVE 06/20/2015 2145   HGBUR MODERATE ([name]) 06/20/2015 2145   BILIRUBINUR NEGATIVE 06/20/2015 2145   KETONESUR NEGATIVE 06/20/2015 2145   PROTEINUR >300 ([name]) 06/20/2015 2145   UROBILINOGEN 0.2 06/12/2015 2105    NITRITE NEGATIVE 06/20/2015 2145   LEUKOCYTESUR TRACE ([name]) 06/20/2015 2145   Sepsis Labs: !!!!!!!!!!!!!!!!!!!!!!!!!!!!!!!!!!!!!!!!!!!! '@[email or web address]'$ (procalcitonin:4,lacticidven:4) ) Recent Results (from the past 240 hour(s))  SARS CORONAVIRUS 2 (TAT 6-24 HRS) Nasopharyngeal Nasopharyngeal Swab     Status: Abnormal   Collection Time: 06/16/19  4:18 PM   Specimen: Nasopharyngeal Swab  Result Value Ref Range Status   SARS Coronavirus 2 POSITIVE ([name]) NEGATIVE Final    Comment: RESULT CALLED TO, READ BACK BY AND VERIFIED WITH: L.ADKINES RN [phone] 06/16/2019 MCCORMICK K (NOTE) SARS-CoV-2 target nucleic acids are DETECTED. The SARS-CoV-2 RNA is generally detectable in upper and lower respiratory specimens during the acute phase of infection. Positive results are indicative of active infection with SARS-CoV-2. Clinical  correlation with patient history and other diagnostic information is necessary to determine patient infection status. Positive results do  not rule out bacterial infection or co-infection with other viruses. The expected result is Negative. Fact Sheet for Patients: SugarRoll.be Fact Sheet for Healthcare Providers: https://www.woods-mathews.com/ This test is not yet approved or cleared by the [place] FDA and  has been authorized for detection and/or diagnosis of SARS-CoV-2 by FDA under an Emergency Use Authorization (EUA). This EUA will remain  in effect (meaning this test can be used)  for the duration of the COVID-19 declaration under Section 564(b)(1) of the Act, 21 U.S.C. section 360bbb-3(b)(1), unless the authorization is terminated or revoked sooner. Performed at [facility], [place] [address] [place], [place] [address]      Radiological Exams on Admission: Dg Chest Portable 1 View  Result Date: 06/16/2019 CLINICAL DATA:  Productive cough, shortness of breath weakness, loss of smell/taste, diarrhea EXAM: PORTABLE  CHEST 1 VIEW COMPARISON:  12/28/2018 FINDINGS: Mild lingular/left lower lobe opacity, atelectasis versus pneumonia. Additional perihilar opacities are possible bilaterally, versus pulmonary vascular congestion. No definite pleural effusions. No pneumothorax. Cardiomegaly. Tracheostomy, in satisfactory position. IMPRESSION: Mild lingular/left lower lobe opacity, atelectasis versus pneumonia. Electronically Signed   By: Julian Hy M.D.   On: 06/16/2019 15:58    cxr reviewed possible lll early infiltrate vs atelectasis Old chart reviewed Case discussed with edp   Assessment/Plan 38 yo male with covid infection and  hypoxia trach dependent. Principal Problem:   COVID-19 virus infection- all inflammatory markers are pending.  Place on decasdron only at this time.  If worsens consider remdisivir.  If procal high or lactic high consider abx coverage.  Symptoms c/w covid infection and only 5 days in, doubt bacterial component but high risk due to trach.  Vit c/zinc and lovenox ordered.  Daily labs ordered.  Active Problems:   Essential hypertension- clarify home meds    Obesity hypoventilation syndrome ([place])- noted,    Chronic diastolic heart failure ([place])- stable at this time, no evidence of overload, ck bnp for baseline/admitting purposes.   Diabetes mellitus type 2 with complications ([place])- ssi   Tracheostomy dependence ([place])- noted    DVT prophylaxis:  lovenox Code Status:  full Family Communication:  none Disposition Plan:  days Consults called:  none Admission status:  admission   Derrick Donaldson A MD [facility]  If 7PM-7AM, please contact night-coverage [email or web address] Password [email or web address]  06/17/2019, 5:07 AM

## 2019-06-17 NOTE — ED Notes (Signed)
Report given to: Delynn Flavin, RN at [facility]

## 2019-06-18 DIAGNOSIS — U071 COVID-19: Principal | ICD-10-CM

## 2019-06-18 LAB — GLUCOSE, CAPILLARY
Glucose-Capillary: 189 mg/dL — ABNORMAL HIGH (ref 70–99)
Glucose-Capillary: 161 mg/dL — ABNORMAL HIGH (ref 70–99)
Glucose-Capillary: 229 mg/dL — ABNORMAL HIGH (ref 70–99)
Glucose-Capillary: 148 mg/dL — ABNORMAL HIGH (ref 70–99)
Glucose-Capillary: 211 mg/dL — ABNORMAL HIGH (ref 70–99)
Glucose-Capillary: 163 mg/dL — ABNORMAL HIGH (ref 70–99)

## 2019-06-18 LAB — C-REACTIVE PROTEIN: CRP: 2.6 mg/dL — ABNORMAL HIGH (ref <1.0)

## 2019-06-18 LAB — FERRITIN: Ferritin: 173 ng/mL (ref 24–336)

## 2019-06-18 LAB — COMPREHENSIVE METABOLIC PANEL
ALT: 18 U/L (ref 0–44)
AST: 21 U/L (ref 15–41)
Albumin: 3.3 g/dL — ABNORMAL LOW (ref 3.5–5.0)
Alkaline Phosphatase: 87 U/L (ref 38–126)
Anion gap: 9 (ref 5–15)
BUN: 18 mg/dL (ref 6–20)
CO2: 30 mmol/L (ref 22–32)
Calcium: 8.8 mg/dL — ABNORMAL LOW (ref 8.9–10.3)
Chloride: 101 mmol/L (ref 98–111)
Creatinine, Ser: 0.87 mg/dL (ref 0.61–1.24)
GFR calc Af Amer: >60 mL/min (ref >60)
GFR calc non Af Amer: >60 mL/min (ref >60)
Glucose, Bld: 174 mg/dL — ABNORMAL HIGH (ref 70–99)
Potassium: 3.7 mmol/L (ref 3.5–5.1)
Sodium: 140 mmol/L (ref 135–145)
Total Bilirubin: 0.2 mg/dL — ABNORMAL LOW (ref 0.3–1.2)
Total Protein: 7.1 g/dL (ref 6.5–8.1)

## 2019-06-18 LAB — PHOSPHORUS: Phosphorus: 3.4 mg/dL (ref 2.5–4.6)

## 2019-06-18 LAB — D-DIMER, QUANTITATIVE: D-Dimer, Quant: 0.76 ug/mL-FEU — ABNORMAL HIGH (ref 0.00–0.50)

## 2019-06-18 LAB — CBC WITH DIFFERENTIAL/PLATELET
Abs Immature Granulocytes: 0.01 10*3/uL (ref 0.00–0.07)
Basophils Absolute: 0 10*3/uL (ref 0.0–0.1)
Basophils Relative: 0 %
Eosinophils Absolute: 0 10*3/uL (ref 0.0–0.5)
Eosinophils Relative: 0 %
HCT: 39.3 % (ref 39.0–52.0)
Hemoglobin: 12.3 g/dL — ABNORMAL LOW (ref 13.0–17.0)
Immature Granulocytes: 0 %
Lymphocytes Relative: 32 %
Lymphs Abs: 1.2 10*3/uL (ref 0.7–4.0)
MCH: 29.3 pg (ref 26.0–34.0)
MCHC: 31.3 g/dL (ref 30.0–36.0)
MCV: 93.6 fL (ref 80.0–100.0)
Monocytes Absolute: 0.4 10*3/uL (ref 0.1–1.0)
Monocytes Relative: 11 %
Neutro Abs: 2.1 10*3/uL (ref 1.7–7.7)
Neutrophils Relative %: 57 %
Platelets: 162 10*3/uL (ref 150–400)
RBC: 4.2 MIL/uL — ABNORMAL LOW (ref 4.22–5.81)
RDW: 13.9 % (ref 11.5–15.5)
WBC: 3.6 10*3/uL — ABNORMAL LOW (ref 4.0–10.5)
nRBC: 0 % (ref 0.0–0.2)

## 2019-06-18 LAB — LACTATE DEHYDROGENASE: LDH: 167 U/L (ref 98–192)

## 2019-06-18 LAB — MAGNESIUM: Magnesium: 2 mg/dL (ref 1.7–2.4)

## 2019-06-18 LAB — HIV ANTIBODY (ROUTINE TESTING W REFLEX): HIV Screen 4th Generation wRfx: NONREACTIVE — AB

## 2019-06-18 MED ORDER — INFLUENZA VAC SPLIT QUAD 0.5 ML IM SUSY
0.5000 mL | PREFILLED_SYRINGE | INTRAMUSCULAR | Status: AC | PRN
  Administered 2019-06-21: 0.5 mL via INTRAMUSCULAR
  Filled 2019-06-18: qty 0.5

## 2019-06-18 MED ORDER — INSULIN ASPART 100 UNIT/ML ~~LOC~~ SOLN: 0.0000 [IU] | Freq: Every day | SUBCUTANEOUS | Status: DC

## 2019-06-18 MED ORDER — INSULIN ASPART 100 UNIT/ML ~~LOC~~ SOLN
0.0000 [IU] | Freq: Three times a day (TID) | SUBCUTANEOUS | Status: DC
  Administered 2019-06-19: 4 [IU] via SUBCUTANEOUS
  Administered 2019-06-19: 11 [IU] via SUBCUTANEOUS
  Administered 2019-06-19 – 2019-06-20 (×2): 4 [IU] via SUBCUTANEOUS
  Administered 2019-06-20: 3 [IU] via SUBCUTANEOUS
  Administered 2019-06-21: 7 [IU] via SUBCUTANEOUS
  Administered 2019-06-21: 3 [IU] via SUBCUTANEOUS

## 2019-06-18 NOTE — Progress Notes (Addendum)
Derrick [name]  GEZ:[id number] DOB: 15-Sep-1980 DOA: 06/16/2019 PCP: Myrlene Broker, MD    Brief Narrative:  38 year old with a history of chronic hypoxic respiratory failure status post permanent tracheostomy placement, morbid obesity, OSA, DM2, HTN, pulmonary hypertension, and chronic diastolic CHF who presented with 5 days of shortness of breath cough diarrhea and fatigue.  His mother was known to be Covid positive.  In the ED he was hypoxic to 87% on room air.  Significant Events: 11/17 admit via med [facility]  COVID-19 specific Treatment: Decadron 11/17 > Remdesivir 11/17 >  Subjective: Resting comfortably in bed at the time of my visit.  Denies chest pain nausea vomiting or abdominal pain.  Feels only mildly short of breath.  Admits that he normally only uses oxygen at nighttime.  Assessment & Plan:  Covid pneumonia -acute on chronic hypoxic respiratory failure Continue Decadron and remdesivir -not a candidate for other therapies presently  OSA -OHS -pulmonary hypertension Unable to use CPAP at night while at [facility] -monitor for evidence of hypercarbia  Chronic diastolic CHF Diurese as tolerated -follow daily weights and I's and O's Filed Weights   06/17/19 0500  Weight: (!) 186.9 kg     DM 2 A1c 7.0 -CBG reasonably controlled -follow without change for now  Morbid obesity - Body mass index is 59.12 kg/m.  Chronic pain syndrome Well compensated presently  DVT prophylaxis: Lovenox Code Status: FULL CODE Family Communication:  Disposition Plan:   Consultants:  none  Antimicrobials:  None  Objective: Blood pressure (!) 132/93, pulse 81, temperature 98.2 F (36.8 C), temperature source Oral, resp. rate (!) 24, height 5\' 10"  (1.778 m), weight (!) 186.9 kg, SpO2 95 %.  Intake/Output Summary (Last 24 hours) at 06/18/2019 0954 Last data filed at 06/18/2019 0500 Gross per 24 hour  Intake 1720 ml  Output 1400 ml  Net 320 ml    Filed Weights   06/17/19 0500  Weight: (!) 186.9 kg    Examination: General: No acute respiratory distress Lungs: Poor air movement appreciated in all fields with mild basilar crackles Cardiovascular: Regular rate and rhythm without murmur gallop or rub normal S1 and S2 Abdomen: Nontender, morbidly obese, soft, bowel sounds positive, no rebound, no ascites, no appreciable mass Extremities: 1+ bilateral lower extremity edema  CBC: Recent Labs  Lab 06/16/19 1618 06/17/19 0558 06/18/19 0400  WBC 4.5 4.1 3.6*  NEUTROABS 2.5 2.2 2.1  HGB 12.9* 12.3* 12.3*  HCT 41.9 40.0 39.3  MCV 94.4 94.6 93.6  PLT 167 162 162   Basic Metabolic Panel: Recent Labs  Lab 06/16/19 1618 06/17/19 0558 06/18/19 0400  NA 137 140 140  K 3.6 3.5 3.7  CL 99 98 101  CO2 28 32 30  GLUCOSE 123* 126* 174*  BUN 17 19 18   CREATININE 1.08 1.08 0.87  CALCIUM 8.3* 8.7* 8.8*  MG  --  1.9 2.0  PHOS  --  3.7 3.4   GFR: Estimated Creatinine Clearance: 195 mL/min (by C-G formula based on SCr of 0.87 mg/dL).  Liver Function Tests: Recent Labs  Lab 06/16/19 1618 06/17/19 0558 06/18/19 0400  AST 26 26 21   ALT 20 19 18   ALKPHOS 97 91 87  BILITOT 0.6 0.4 0.2*  PROT 7.7 7.3 7.1  ALBUMIN 3.6 3.5 3.3*    HbA1C: Hgb A1c MFr Bld  Date/Time Value Ref Range Status  06/16/2019 04:18 PM 7.1 (H) 4.8 - 5.6 % Final    Comment:    (NOTE)  Pre diabetes:          5.7%-6.4% Diabetes:              >6.4% Glycemic control for   <7.0% adults with diabetes   04/01/2019 01:37 PM 7.0 (H) 4.6 - 6.5 % Final    Comment:    Glycemic Control Guidelines for People with Diabetes:Non Diabetic:  <6%Goal of Therapy: <7%Additional Action Suggested:  >8%     CBG: Recent Labs  Lab 06/17/19 1554 06/17/19 2011 06/18/19 0026 06/18/19 0501 06/18/19 0807  GLUCAP 214* 229* 161* 189* 148*    Recent Results (from the past 240 hour(s))  SARS CORONAVIRUS 2 (TAT 6-24 HRS) Nasopharyngeal Nasopharyngeal Swab     Status: Abnormal    Collection Time: 06/16/19  4:18 PM   Specimen: Nasopharyngeal Swab  Result Value Ref Range Status   SARS Coronavirus 2 POSITIVE (A) NEGATIVE Final    Comment: RESULT CALLED TO, READ BACK BY AND VERIFIED WITH: L.ADKINES RN [phone] 06/16/2019 MCCORMICK K (NOTE) SARS-CoV-2 target nucleic acids are DETECTED. The SARS-CoV-2 RNA is generally detectable in upper and lower respiratory specimens during the acute phase of infection. Positive results are indicative of active infection with SARS-CoV-2. Clinical  correlation with patient history and other diagnostic information is necessary to determine patient infection status. Positive results do  not rule out bacterial infection or co-infection with other viruses. The expected result is Negative. Fact Sheet for Patients: SugarRoll.be Fact Sheet for Healthcare Providers: https://www.woods-mathews.com/ This test is not yet approved or cleared by the [place] FDA and  has been authorized for detection and/or diagnosis of SARS-CoV-2 by FDA under an Emergency Use Authorization (EUA). This EUA will remain  in effect (meaning this test can be used)  for the duration of the COVID-19 declaration under Section 564(b)(1) of the Act, 21 U.S.C. section 360bbb-3(b)(1), unless the authorization is terminated or revoked sooner. Performed at [facility], [place] [address], [place], [place] [address]      Scheduled Meds: . allopurinol  300 mg Oral Daily  . clindamycin  1 application Topical Daily  . dexamethasone (DECADRON) injection  6 mg Intravenous Q24H  . enoxaparin (LOVENOX) injection  90 mg Subcutaneous Q24H  . famotidine  20 mg Oral Daily  . furosemide  160 mg Oral BID  . gabapentin  1,200 mg Oral BID  . guaiFENesin  600 mg Oral BID  . influenza vac split quadrivalent PF  0.5 mL Intramuscular Tomorrow-1000  . insulin aspart  0-20 Units Subcutaneous Q4H  . metoprolol tartrate  50 mg Oral BID  .  sodium chloride flush  3 mL Intravenous Q12H  . vitamin C  500 mg Oral Daily  . zinc sulfate  220 mg Oral Daily   Continuous Infusions: . sodium chloride    . remdesivir 100 mg in NS 250 mL 100 mg (06/18/19 0933)     LOS: 1 day   Cherene Altes, MD [facility] Office  [phone] Pager - Text Page per Amion  If 7PM-7AM, please contact night-coverage per Amion 06/18/2019, 9:54 AM

## 2019-06-18 NOTE — Progress Notes (Signed)
Nutrition Consult  Received consult for diet education. Patient is not currently appropriate for weight loss education during his acute hospitalization for COVID 19. He has increased nutrient needs and will likely lose weight on current heart healthy diet. Please make referral for outpatient weight loss diet education after d/c from the hospital. Will attach weight loss information to his discharge instructions.   Molli Barrows, RD, LDN, [place] Pager [phone] After Hours Pager [phone]

## 2019-06-18 NOTE — Plan of Care (Signed)
Pt slept well during the night. No complaints of pain verbalized. Alert and oriented. Vitals stable O2 weaned to 3L [place] as tolerated, trache secured in place with passey-muir valve in use. No distress, one episode of apnea during the night, sats =75% pt woken up, sats immediatly increased to 95%. Productive cough noted. Incentive spirometry exercises started. Minimal assist with ADLs. IV SL. Unable to tolerate prone positioning  overnight. No other issues, will monitor.   Problem: Education: Goal: Knowledge of General Education information will improve Description: Including pain rating scale, medication(s)/side effects and non-pharmacologic comfort measures Outcome: Progressing   Problem: Clinical Measurements: Goal: Respiratory complications will improve Outcome: Progressing   Problem: Clinical Measurements: Goal: Cardiovascular complication will be avoided Outcome: Progressing   Problem: Activity: Goal: Risk for activity intolerance will decrease Outcome: Progressing   Problem: Coping: Goal: Level of anxiety will decrease Outcome: Progressing

## 2019-06-19 LAB — COMPREHENSIVE METABOLIC PANEL
ALT: 19 U/L (ref 0–44)
AST: 22 U/L (ref 15–41)
Albumin: 3.6 g/dL (ref 3.5–5.0)
Alkaline Phosphatase: 81 U/L (ref 38–126)
Anion gap: 12 (ref 5–15)
BUN: 21 mg/dL — ABNORMAL HIGH (ref 6–20)
CO2: 28 mmol/L (ref 22–32)
Calcium: 8.4 mg/dL — ABNORMAL LOW (ref 8.9–10.3)
Chloride: 98 mmol/L (ref 98–111)
Creatinine, Ser: 0.91 mg/dL (ref 0.61–1.24)
GFR calc Af Amer: >60 mL/min (ref >60)
GFR calc non Af Amer: >60 mL/min (ref >60)
Glucose, Bld: 186 mg/dL — ABNORMAL HIGH (ref 70–99)
Potassium: 3.3 mmol/L — ABNORMAL LOW (ref 3.5–5.1)
Sodium: 138 mmol/L (ref 135–145)
Total Bilirubin: 0.6 mg/dL (ref 0.3–1.2)
Total Protein: 7.2 g/dL (ref 6.5–8.1)

## 2019-06-19 LAB — CBC WITH DIFFERENTIAL/PLATELET
Abs Immature Granulocytes: 0.01 10*3/uL (ref 0.00–0.07)
Basophils Absolute: 0 10*3/uL (ref 0.0–0.1)
Basophils Relative: 0 %
Eosinophils Absolute: 0 10*3/uL (ref 0.0–0.5)
Eosinophils Relative: 0 %
HCT: 39.6 % (ref 39.0–52.0)
Hemoglobin: 12.2 g/dL — ABNORMAL LOW (ref 13.0–17.0)
Immature Granulocytes: 0 %
Lymphocytes Relative: 26 %
Lymphs Abs: 1.4 10*3/uL (ref 0.7–4.0)
MCH: 29.1 pg (ref 26.0–34.0)
MCHC: 30.8 g/dL (ref 30.0–36.0)
MCV: 94.5 fL (ref 80.0–100.0)
Monocytes Absolute: 0.5 10*3/uL (ref 0.1–1.0)
Monocytes Relative: 9 %
Neutro Abs: 3.5 10*3/uL (ref 1.7–7.7)
Neutrophils Relative %: 65 %
Platelets: 176 10*3/uL (ref 150–400)
RBC: 4.19 MIL/uL — ABNORMAL LOW (ref 4.22–5.81)
RDW: 13.7 % (ref 11.5–15.5)
WBC: 5.4 10*3/uL (ref 4.0–10.5)
nRBC: 0 % (ref 0.0–0.2)

## 2019-06-19 LAB — GLUCOSE, CAPILLARY
Glucose-Capillary: 182 mg/dL — ABNORMAL HIGH (ref 70–99)
Glucose-Capillary: 158 mg/dL — ABNORMAL HIGH (ref 70–99)
Glucose-Capillary: 251 mg/dL — ABNORMAL HIGH (ref 70–99)
Glucose-Capillary: 166 mg/dL — ABNORMAL HIGH (ref 70–99)

## 2019-06-19 LAB — FERRITIN: Ferritin: 180 ng/mL (ref 24–336)

## 2019-06-19 LAB — C-REACTIVE PROTEIN: CRP: 1.2 mg/dL — ABNORMAL HIGH (ref <1.0)

## 2019-06-19 LAB — D-DIMER, QUANTITATIVE: D-Dimer, Quant: 0.61 ug/mL-FEU — ABNORMAL HIGH (ref 0.00–0.50)

## 2019-06-19 MED ORDER — POTASSIUM CHLORIDE CRYS ER 20 MEQ PO TBCR
20.0000 meq | EXTENDED_RELEASE_TABLET | Freq: Two times a day (BID) | ORAL | Status: DC
  Administered 2019-06-19: 20 meq via ORAL
  Filled 2019-06-19: qty 1

## 2019-06-19 MED ORDER — ALBUTEROL SULFATE HFA 108 (90 BASE) MCG/ACT IN AERS
2.0000 | INHALATION_SPRAY | Freq: Four times a day (QID) | RESPIRATORY_TRACT | Status: DC | PRN
  Filled 2019-06-19: qty 6.7

## 2019-06-19 NOTE — Progress Notes (Signed)
[facility], mother, and given an update on the patient. Appreciative of update.  Answered questions about being in the home with the patient as they live together.  Updated on recommendations of patient in own room and bathroom.  States he has his own room and bathroom.

## 2019-06-19 NOTE — Progress Notes (Signed)
Derrick [name]  OAC:[id number] DOB: [birth year] DOA: 06/16/2019 PCP: Myrlene Broker, MD    Brief Narrative:  38 year old with a history of chronic hypoxic respiratory failure status post permanent tracheostomy placement, morbid obesity, OSA, DM2, HTN, pulmonary hypertension, and chronic diastolic CHF who presented with 5 days of shortness of breath cough diarrhea and fatigue.  His mother was known to be Covid positive.  In the ED he was hypoxic to 87% on room air.  Significant Events: 11/17 admit via med [facility]  COVID-19 specific Treatment: Decadron 11/17 > Remdesivir 11/17 >  Subjective: Sitting up comfortably in a bedside chair.  Denies significant shortness of breath but is requiring oxygen support which he does not normally use at home.  Denies chest pain nausea vomiting or abdominal pain  Assessment & Plan:  Covid pneumonia -acute on chronic hypoxic respiratory failure Continue Decadron and remdesivir -not a candidate for other therapies presently -appears to be stable for now -will need to complete 5-day course of remdesivir  OSA -OHS -pulmonary hypertension Unable to use CPAP at night while at [facility] -monitor for evidence of hypercarbia -stable for now  Chronic diastolic CHF Continue to diurese -not grossly overloaded on physical exam -net negative approximately 1200 cc since admission thus far  Hypokalemia Due to diuretic use -supplement and follow -magnesium normal yesterday  DM 2 A1c 7.0 -CBG reasonably controlled -follow without change for now  Morbid obesity - Body mass index is 59.12 kg/m.  Chronic pain syndrome Well compensated presently  DVT prophylaxis: Lovenox Code Status: FULL CODE Family Communication:  Disposition Plan: Medical surgical bed  Consultants:  none  Antimicrobials:  None  Objective: Blood pressure 137/86, pulse 70, temperature (!) 96.9 F (36.1 C), temperature source Oral, resp. rate 15, height 5\' 10"   (1.778 m), weight (!) 186.9 kg, SpO2 95 %.  Intake/Output Summary (Last 24 hours) at 06/19/2019 1009 Last data filed at 06/19/2019 0500 Gross per 24 hour  Intake 638.86 ml  Output 1000 ml  Net -361.14 ml   Filed Weights   06/17/19 0500  Weight: (!) 186.9 kg    Examination: General: No acute respiratory distress Lungs: Very distant breath sounds throughout all fields with no wheezing Cardiovascular: Distant heart sounds, no murmur appreciable Abdomen: Morbidly obese, soft, no rebound Extremities: 1+ bilateral lower extremity edema without change  CBC: Recent Labs  Lab 06/17/19 0558 06/18/19 0400 06/19/19 0148  WBC 4.1 3.6* 5.4  NEUTROABS 2.2 2.1 3.5  HGB 12.3* 12.3* 12.2*  HCT 40.0 39.3 39.6  MCV 94.6 93.6 94.5  PLT 162 162 176   Basic Metabolic Panel: Recent Labs  Lab 06/17/19 0558 06/18/19 0400 06/19/19 0148  NA 140 140 138  K 3.5 3.7 3.3*  CL 98 101 98  CO2 32 30 28  GLUCOSE 126* 174* 186*  BUN 19 18 21*  CREATININE 1.08 0.87 0.91  CALCIUM 8.7* 8.8* 8.4*  MG 1.9 2.0  --   PHOS 3.7 3.4  --    GFR: Estimated Creatinine Clearance: 186.4 mL/min (by C-G formula based on SCr of 0.91 mg/dL).  Liver Function Tests: Recent Labs  Lab 06/16/19 1618 06/17/19 0558 06/18/19 0400 06/19/19 0148  AST 26 26 21 22   ALT 20 19 18 19   ALKPHOS 97 91 87 81  BILITOT 0.6 0.4 0.2* 0.6  PROT 7.7 7.3 7.1 7.2  ALBUMIN 3.6 3.5 3.3* 3.6    HbA1C: Hgb A1c MFr Bld  Date/Time Value Ref Range Status  06/16/2019 04:18  PM 7.1 (H) 4.8 - 5.6 % Final    Comment:    (NOTE) Pre diabetes:          5.7%-6.4% Diabetes:              >6.4% Glycemic control for   <7.0% adults with diabetes   04/01/2019 01:37 PM 7.0 (H) 4.6 - 6.5 % Final    Comment:    Glycemic Control Guidelines for People with Diabetes:Non Diabetic:  <6%Goal of Therapy: <7%Additional Action Suggested:  >8%     CBG: Recent Labs  Lab 06/18/19 0501 06/18/19 0807 06/18/19 1306 06/18/19 1631 06/19/19 0730   GLUCAP 189* 148* 211* 163* 158*    Recent Results (from the past 240 hour(s))  SARS CORONAVIRUS 2 (TAT 6-24 HRS) Nasopharyngeal Nasopharyngeal Swab     Status: Abnormal   Collection Time: 06/16/19  4:18 PM   Specimen: Nasopharyngeal Swab  Result Value Ref Range Status   SARS Coronavirus 2 POSITIVE (A) NEGATIVE Final    Comment: RESULT CALLED TO, READ BACK BY AND VERIFIED WITH: L.ADKINES RN [phone] 06/16/2019 MCCORMICK K (NOTE) SARS-CoV-2 target nucleic acids are DETECTED. The SARS-CoV-2 RNA is generally detectable in upper and lower respiratory specimens during the acute phase of infection. Positive results are indicative of active infection with SARS-CoV-2. Clinical  correlation with patient history and other diagnostic information is necessary to determine patient infection status. Positive results do  not rule out bacterial infection or co-infection with other viruses. The expected result is Negative. Fact Sheet for Patients: SugarRoll.be Fact Sheet for Healthcare Providers: https://www.woods-mathews.com/ This test is not yet approved or cleared by the [place] FDA and  has been authorized for detection and/or diagnosis of SARS-CoV-2 by FDA under an Emergency Use Authorization (EUA). This EUA will remain  in effect (meaning this test can be used)  for the duration of the COVID-19 declaration under Section 564(b)(1) of the Act, 21 U.S.C. section 360bbb-3(b)(1), unless the authorization is terminated or revoked sooner. Performed at [facility], [place] [address], [place], [place] [address]      Scheduled Meds: . allopurinol  300 mg Oral Daily  . clindamycin  1 application Topical Daily  . dexamethasone (DECADRON) injection  6 mg Intravenous Q24H  . enoxaparin (LOVENOX) injection  90 mg Subcutaneous Q24H  . famotidine  20 mg Oral Daily  . furosemide  160 mg Oral BID  . gabapentin  1,200 mg Oral BID  . guaiFENesin  600 mg Oral  BID  . insulin aspart  0-20 Units Subcutaneous TID WC  . insulin aspart  0-5 Units Subcutaneous QHS  . metoprolol tartrate  50 mg Oral BID  . sodium chloride flush  3 mL Intravenous Q12H  . vitamin C  500 mg Oral Daily  . zinc sulfate  220 mg Oral Daily   Continuous Infusions: . sodium chloride    . remdesivir 100 mg in NS 250 mL 100 mg (06/19/19 0955)     LOS: 2 days   Cherene Altes, MD [facility] Office  [phone] Pager - Text Page per Amion  If 7PM-7AM, please contact night-coverage per Amion 06/19/2019, 10:09 AM

## 2019-06-20 LAB — CBC WITH DIFFERENTIAL/PLATELET
Abs Immature Granulocytes: 0.01 10*3/uL (ref 0.00–0.07)
Basophils Absolute: 0 10*3/uL (ref 0.0–0.1)
Basophils Relative: 0 %
Eosinophils Absolute: 0 10*3/uL (ref 0.0–0.5)
Eosinophils Relative: 0 %
HCT: 39.6 % (ref 39.0–52.0)
Hemoglobin: 12.1 g/dL — ABNORMAL LOW (ref 13.0–17.0)
Immature Granulocytes: 0 %
Lymphocytes Relative: 30 %
Lymphs Abs: 1.7 10*3/uL (ref 0.7–4.0)
MCH: 28.7 pg (ref 26.0–34.0)
MCHC: 30.6 g/dL (ref 30.0–36.0)
MCV: 94.1 fL (ref 80.0–100.0)
Monocytes Absolute: 0.5 10*3/uL (ref 0.1–1.0)
Monocytes Relative: 8 %
Neutro Abs: 3.5 10*3/uL (ref 1.7–7.7)
Neutrophils Relative %: 62 %
Platelets: 184 10*3/uL (ref 150–400)
RBC: 4.21 MIL/uL — ABNORMAL LOW (ref 4.22–5.81)
RDW: 13.7 % (ref 11.5–15.5)
WBC: 5.6 10*3/uL (ref 4.0–10.5)
nRBC: 0 % (ref 0.0–0.2)

## 2019-06-20 LAB — COMPREHENSIVE METABOLIC PANEL
ALT: 21 U/L (ref 0–44)
AST: 20 U/L (ref 15–41)
Albumin: 3.5 g/dL (ref 3.5–5.0)
Alkaline Phosphatase: 80 U/L (ref 38–126)
Anion gap: 10 (ref 5–15)
BUN: 21 mg/dL — ABNORMAL HIGH (ref 6–20)
CO2: 32 mmol/L (ref 22–32)
Calcium: 8.6 mg/dL — ABNORMAL LOW (ref 8.9–10.3)
Chloride: 101 mmol/L (ref 98–111)
Creatinine, Ser: 0.94 mg/dL (ref 0.61–1.24)
GFR calc Af Amer: >60 mL/min (ref >60)
GFR calc non Af Amer: >60 mL/min (ref >60)
Glucose, Bld: 141 mg/dL — ABNORMAL HIGH (ref 70–99)
Potassium: 3.5 mmol/L (ref 3.5–5.1)
Sodium: 143 mmol/L (ref 135–145)
Total Bilirubin: 0.4 mg/dL (ref 0.3–1.2)
Total Protein: 7.1 g/dL (ref 6.5–8.1)

## 2019-06-20 LAB — GLUCOSE, CAPILLARY
Glucose-Capillary: 155 mg/dL — ABNORMAL HIGH (ref 70–99)
Glucose-Capillary: 141 mg/dL — ABNORMAL HIGH (ref 70–99)
Glucose-Capillary: 136 mg/dL — ABNORMAL HIGH (ref 70–99)

## 2019-06-20 LAB — C-REACTIVE PROTEIN: CRP: 0.8 mg/dL (ref <1.0)

## 2019-06-20 LAB — FERRITIN: Ferritin: 153 ng/mL (ref 24–336)

## 2019-06-20 LAB — D-DIMER, QUANTITATIVE: D-Dimer, Quant: 0.54 ug/mL-FEU — ABNORMAL HIGH (ref 0.00–0.50)

## 2019-06-20 MED ORDER — POTASSIUM CHLORIDE CRYS ER 20 MEQ PO TBCR
40.0000 meq | EXTENDED_RELEASE_TABLET | Freq: Two times a day (BID) | ORAL | Status: DC
  Administered 2019-06-20 – 2019-06-21 (×3): 40 meq via ORAL
  Filled 2019-06-20 (×3): qty 2

## 2019-06-20 MED ORDER — PROMETHAZINE HCL 25 MG/ML IJ SOLN: 12.5000 mg | Freq: Four times a day (QID) | INTRAMUSCULAR | Status: DC | PRN

## 2019-06-20 NOTE — Progress Notes (Signed)
Derrick [name]  NGE:[id number] DOB: 1980/09/22 DOA: 06/16/2019 PCP: Myrlene Broker, MD    Brief Narrative:  38 year old with a history of chronic hypoxic respiratory failure status post permanent tracheostomy placement, morbid obesity, OSA, DM2, HTN, pulmonary hypertension, and chronic diastolic CHF who presented with 5 days of shortness of breath cough diarrhea and fatigue.  His mother was known to be Covid positive.  In the ED he was hypoxic to 87% on room air.  Significant Events: 11/17 admit via med [facility]  COVID-19 specific Treatment: Decadron 11/17 > Remdesivir 11/17 >  Subjective: Oxygen requirements increased somewhat overnight with patient now requiring 5 L of nasal cannula oxygen support.  Since waking up his nurse has successfully weaned him back to room air without difficulty.  He denies shortness of breath fevers chills nausea or vomiting at this time.  Assessment & Plan:  Covid pneumonia -acute on chronic hypoxic respiratory failure Continue Decadron and remdesivir -not a candidate for other therapies presently -appears to be stable for now -will need to complete 5-day course of remdesivir  OSA -OHS -pulmonary hypertension Unable to use CPAP at night while at [facility], which probably accounts for his desaturation last night - monitor for evidence of hypercarbia, which we are not yet witnessing  Chronic diastolic CHF Continue to diurese -not grossly overloaded on physical exam -net negative approximately 1800 cc since admission  Hypokalemia Due to diuretic use -increase supplementation and recheck  DM 2 A1c 7.0 -CBG reasonably controlled -follow without change for now  Morbid obesity - Body mass index is 59.12 kg/m.  Chronic pain syndrome Well compensated presently  DVT prophylaxis: Lovenox Code Status: FULL CODE Family Communication:  Disposition Plan: Medical surgical bed - possible discharge 11/21  Consultants:  none   Antimicrobials:  None  Objective: Blood pressure (!) 147/95, pulse (!) 53, temperature 98.4 F (36.9 C), temperature source Oral, resp. rate 20, height 5\' 10"  (1.778 m), weight (!) 186.9 kg, SpO2 96 %.  Intake/Output Summary (Last 24 hours) at 06/20/2019 0850 Last data filed at 06/20/2019 0400 Gross per 24 hour  Intake 490 ml  Output 1100 ml  Net -610 ml   Filed Weights   06/17/19 0500  Weight: (!) 186.9 kg    Examination: General: No acute respiratory distress -alert and conversant Lungs: Very distant breath sounds throughout all fields -no appreciable crackles Cardiovascular: Distant heart sounds, RRR Abdomen: Morbidly obese, soft, no rebound Extremities: 1+ bilateral lower extremity edema which is stable  CBC: Recent Labs  Lab 06/18/19 0400 06/19/19 0148 06/20/19 0009  WBC 3.6* 5.4 5.6  NEUTROABS 2.1 3.5 3.5  HGB 12.3* 12.2* 12.1*  HCT 39.3 39.6 39.6  MCV 93.6 94.5 94.1  PLT 162 176 184   Basic Metabolic Panel: Recent Labs  Lab 06/17/19 0558 06/18/19 0400 06/19/19 0148 06/20/19 0009  NA 140 140 138 143  K 3.5 3.7 3.3* 3.5  CL 98 101 98 101  CO2 32 30 28 32  GLUCOSE 126* 174* 186* 141*  BUN 19 18 21* 21*  CREATININE 1.08 0.87 0.91 0.94  CALCIUM 8.7* 8.8* 8.4* 8.6*  MG 1.9 2.0  --   --   PHOS 3.7 3.4  --   --    GFR: Estimated Creatinine Clearance: 180.5 mL/min (by C-G formula based on SCr of 0.94 mg/dL).  Liver Function Tests: Recent Labs  Lab 06/17/19 0558 06/18/19 0400 06/19/19 0148 06/20/19 0009  AST 26 21 22 20   ALT 19 18 19  21  ALKPHOS 91 87 81 80  BILITOT 0.4 0.2* 0.6 0.4  PROT 7.3 7.1 7.2 7.1  ALBUMIN 3.5 3.3* 3.6 3.5    HbA1C: Hgb A1c MFr Bld  Date/Time Value Ref Range Status  06/16/2019 04:18 PM 7.1 (H) 4.8 - 5.6 % Final    Comment:    (NOTE) Pre diabetes:          5.7%-6.4% Diabetes:              >6.4% Glycemic control for   <7.0% adults with diabetes   04/01/2019 01:37 PM 7.0 (H) 4.6 - 6.5 % Final    Comment:     Glycemic Control Guidelines for People with Diabetes:Non Diabetic:  <6%Goal of Therapy: <7%Additional Action Suggested:  >8%     CBG: Recent Labs  Lab 06/19/19 0730 06/19/19 1128 06/19/19 1557 06/19/19 2044 06/20/19 0745  GLUCAP 158* 251* 166* 155* 141*    Recent Results (from the past 240 hour(s))  SARS CORONAVIRUS 2 (TAT 6-24 HRS) Nasopharyngeal Nasopharyngeal Swab     Status: Abnormal   Collection Time: 06/16/19  4:18 PM   Specimen: Nasopharyngeal Swab  Result Value Ref Range Status   SARS Coronavirus 2 POSITIVE (A) NEGATIVE Final    Comment: RESULT CALLED TO, READ BACK BY AND VERIFIED WITH: L.ADKINES RN [phone] 06/16/2019 MCCORMICK K (NOTE) SARS-CoV-2 target nucleic acids are DETECTED. The SARS-CoV-2 RNA is generally detectable in upper and lower respiratory specimens during the acute phase of infection. Positive results are indicative of active infection with SARS-CoV-2. Clinical  correlation with patient history and other diagnostic information is necessary to determine patient infection status. Positive results do  not rule out bacterial infection or co-infection with other viruses. The expected result is Negative. Fact Sheet for Patients: SugarRoll.be Fact Sheet for Healthcare Providers: https://www.woods-mathews.com/ This test is not yet approved or cleared by the [place] FDA and  has been authorized for detection and/or diagnosis of SARS-CoV-2 by FDA under an Emergency Use Authorization (EUA). This EUA will remain  in effect (meaning this test can be used)  for the duration of the COVID-19 declaration under Section 564(b)(1) of the Act, 21 U.S.C. section 360bbb-3(b)(1), unless the authorization is terminated or revoked sooner. Performed at [facility], [place] [address] [place], [place] [address]      Scheduled Meds: . allopurinol  300 mg Oral Daily  . clindamycin  1 application Topical Daily  . dexamethasone  (DECADRON) injection  6 mg Intravenous Q24H  . enoxaparin (LOVENOX) injection  90 mg Subcutaneous Q24H  . famotidine  20 mg Oral Daily  . furosemide  160 mg Oral BID  . gabapentin  1,200 mg Oral BID  . guaiFENesin  600 mg Oral BID  . insulin aspart  0-20 Units Subcutaneous TID WC  . insulin aspart  0-5 Units Subcutaneous QHS  . metoprolol tartrate  50 mg Oral BID  . potassium chloride  20 mEq Oral BID  . sodium chloride flush  3 mL Intravenous Q12H  . vitamin C  500 mg Oral Daily  . zinc sulfate  220 mg Oral Daily     LOS: 3 days   Cherene Altes, MD [facility] Office  [phone] Pager - Text Page per Amion  If 7PM-7AM, please contact night-coverage per Amion 06/20/2019, 8:50 AM

## 2019-06-20 NOTE — Evaluation (Signed)
Physical Therapy Evaluation Patient Details Name: Derrick [name] MRN: [id number] DOB: Jan 10, 1981 Today's Date: 06/20/2019   History of Present Illness  38 y/o male w/ hx of reflux, OSA, obesity hypoventilation syndrome, morbid obesity, hypoxemia, HTN, GERD, SM, acute on chronic diastolic CHF arrived to ED w/ c/o generalized weakness, chills, productive cough, loss of smell, loss of taste, diarrhea x5 days. Pt admitted with acute on chronic hypoxic respiratory failure w/ COVID PNA.  Clinical Impression   Pt admitted with above diagnosis. PTA pt was independent and living home with family, he was also working multi positions w/ no deficits. Pt is at mod I with all functional mobility this pm, pt was found ambulating in room on his own. Pt wa on room air throughout session and was able to maintain sats in 90s. Pt ambulated in hall x approx 467ft with mod I at brisk pace, again sats remained in 90s. At this time no further skilled acute care level PT intervention is needed. Pt has been educated on importance of continued ambulation, nurse has also been appraised of pt status and abilities. Thank you for this referral.    Follow Up Recommendations No PT follow up    Equipment Recommendations  None recommended by PT    Recommendations for Other Services       Precautions / Restrictions Precautions Precautions: None Restrictions Weight Bearing Restrictions: No      Mobility  Bed Mobility Overal bed mobility: Modified Independent                Transfers Overall transfer level: Modified independent Equipment used: None                Ambulation/Gait Ambulation/Gait assistance: Modified independent (Device/Increase time) Gait Distance (Feet): 400 Feet Assistive device: None Gait Pattern/deviations: Wide base of support;Step-through pattern   Gait velocity interpretation: >2.62 ft/sec, indicative of community ambulatory General Gait Details: ambulated on room  air, ambulates quite briskly and was able to [employer] in 90s  Stairs            Wheelchair Mobility    Modified Rankin (Stroke Patients Only)       Balance Overall balance assessment: Modified Independent                                           Pertinent Vitals/Pain Pain Assessment: No/denies pain    Home Living Family/patient expects to be discharged to:: Private residence Living Arrangements: Parent Available Help at Discharge: Family Type of Home: House Home Access: Stairs to enter Entrance Stairs-Rails: Can reach both Entrance Stairs-Number of Steps: 2-3   Home Equipment: None      Prior Function Level of Independence: Independent               Hand Dominance        Extremity/Trunk Assessment   Upper Extremity Assessment Upper Extremity Assessment: Overall WFL for tasks assessed    Lower Extremity Assessment Lower Extremity Assessment: Overall WFL for tasks assessed       Communication   Communication: No difficulties  Cognition Arousal/Alertness: Awake/alert Behavior During Therapy: WFL for tasks assessed/performed Overall Cognitive Status: Within Functional Limits for tasks assessed  General Comments      Exercises Other Exercises Other Exercises: reinforced use of incentive spirometer x 5 Other Exercises: use of flutter valve x 5   Assessment/Plan    PT Assessment Patent does not need any further PT services  PT Problem List         PT Treatment Interventions      PT Goals (Current goals can be found in the Care Plan section)  Acute Rehab PT Goals Patient Stated Goal: go home PT Goal Formulation: All assessment and education complete, DC therapy    Frequency     Barriers to discharge        Co-evaluation               AM-PAC PT "6 Clicks" Mobility  Outcome Measure Help needed turning from your back to your side while in a flat  bed without using bedrails?: None Help needed moving from lying on your back to sitting on the side of a flat bed without using bedrails?: None Help needed moving to and from a bed to a chair (including a wheelchair)?: None Help needed standing up from a chair using your arms (e.g., wheelchair or bedside chair)?: None Help needed to walk in hospital room?: None Help needed climbing 3-5 steps with a railing? : None 6 Click Score: 24    End of Session   Activity Tolerance: Patient tolerated treatment well Patient left: Other (comment)(standing in room) Nurse Communication: Mobility status;Other (comment)(need to ambulate more and ability to do so I)      Time: 2951-8841 PT Time Calculation (min) (ACUTE ONLY): 20 min   Charges:   PT Evaluation $PT Eval Low Complexity: 1 Low          Drema Pry, PT   Freddi Starr 06/20/2019, 4:28 PM

## 2019-06-21 LAB — CBC WITH DIFFERENTIAL/PLATELET
Abs Immature Granulocytes: 0.01 10*3/uL (ref 0.00–0.07)
Basophils Absolute: 0 10*3/uL (ref 0.0–0.1)
Basophils Relative: 0 %
Eosinophils Absolute: 0 10*3/uL (ref 0.0–0.5)
Eosinophils Relative: 0 %
HCT: 40.3 % (ref 39.0–52.0)
Hemoglobin: 12.4 g/dL — ABNORMAL LOW (ref 13.0–17.0)
Immature Granulocytes: 0 %
Lymphocytes Relative: 33 %
Lymphs Abs: 2 10*3/uL (ref 0.7–4.0)
MCH: 28.7 pg (ref 26.0–34.0)
MCHC: 30.8 g/dL (ref 30.0–36.0)
MCV: 93.3 fL (ref 80.0–100.0)
Monocytes Absolute: 0.5 10*3/uL (ref 0.1–1.0)
Monocytes Relative: 9 %
Neutro Abs: 3.5 10*3/uL (ref 1.7–7.7)
Neutrophils Relative %: 58 %
Platelets: 196 10*3/uL (ref 150–400)
RBC: 4.32 MIL/uL (ref 4.22–5.81)
RDW: 13.4 % (ref 11.5–15.5)
WBC: 6 10*3/uL (ref 4.0–10.5)
nRBC: 0 % (ref 0.0–0.2)

## 2019-06-21 LAB — GLUCOSE, CAPILLARY
Glucose-Capillary: 145 mg/dL — ABNORMAL HIGH (ref 70–99)
Glucose-Capillary: 235 mg/dL — ABNORMAL HIGH (ref 70–99)

## 2019-06-21 LAB — COMPREHENSIVE METABOLIC PANEL
ALT: 25 U/L (ref 0–44)
AST: 20 U/L (ref 15–41)
Albumin: 3.7 g/dL (ref 3.5–5.0)
Alkaline Phosphatase: 79 U/L (ref 38–126)
Anion gap: 11 (ref 5–15)
BUN: 22 mg/dL — ABNORMAL HIGH (ref 6–20)
CO2: 30 mmol/L (ref 22–32)
Calcium: 8.7 mg/dL — ABNORMAL LOW (ref 8.9–10.3)
Chloride: 101 mmol/L (ref 98–111)
Creatinine, Ser: 0.89 mg/dL (ref 0.61–1.24)
GFR calc Af Amer: >60 mL/min (ref >60)
GFR calc non Af Amer: >60 mL/min (ref >60)
Glucose, Bld: 155 mg/dL — ABNORMAL HIGH (ref 70–99)
Potassium: 3.3 mmol/L — ABNORMAL LOW (ref 3.5–5.1)
Sodium: 142 mmol/L (ref 135–145)
Total Bilirubin: 0.9 mg/dL (ref 0.3–1.2)
Total Protein: 7.2 g/dL (ref 6.5–8.1)

## 2019-06-21 LAB — FERRITIN: Ferritin: 160 ng/mL (ref 24–336)

## 2019-06-21 LAB — C-REACTIVE PROTEIN: CRP: <0.8 mg/dL (ref <1.0)

## 2019-06-21 LAB — D-DIMER, QUANTITATIVE: D-Dimer, Quant: 0.49 ug/mL-FEU (ref 0.00–0.50)

## 2019-06-21 MED ORDER — POTASSIUM CHLORIDE CRYS ER 20 MEQ PO TBCR
40.0000 meq | EXTENDED_RELEASE_TABLET | Freq: Once | ORAL | Status: AC
  Administered 2019-06-21: 40 meq via ORAL
  Filled 2019-06-21: qty 2

## 2019-06-21 MED ORDER — DEXAMETHASONE 6 MG PO TABS: 6.0000 mg | ORAL_TABLET | Freq: Every day | ORAL | 0 refills | Status: AC

## 2019-06-21 MED ORDER — POTASSIUM CHLORIDE CRYS ER 20 MEQ PO TBCR: 60.0000 meq | EXTENDED_RELEASE_TABLET | Freq: Two times a day (BID) | ORAL | Status: DC

## 2019-06-21 MED ORDER — DEXAMETHASONE 6 MG PO TABS: 6.0000 mg | ORAL_TABLET | Freq: Every day | ORAL | 0 refills | Status: DC

## 2019-06-21 MED ORDER — DEXAMETHASONE 6 MG PO TABS: 6.0000 mg | ORAL_TABLET | Freq: Every day | ORAL | Status: DC

## 2019-06-21 MED ORDER — ACETAMINOPHEN 325 MG PO TABS: 650.0000 mg | ORAL_TABLET | Freq: Four times a day (QID) | ORAL | Status: AC | PRN

## 2019-06-21 NOTE — Discharge Instructions (Signed)
Person Under Monitoring Name: Derrick [name]  Location: [address] [place] [address]   Infection Prevention Recommendations for Individuals Confirmed to have, or Being Evaluated for, 2019 Novel Coronavirus (COVID-19) Infection Who Receive Care at Home  Individuals who are confirmed to have, or are being evaluated for, COVID-19 should follow the prevention steps below until a healthcare provider or local or state health department says they can return to normal activities.  Stay home except to get medical care You should restrict activities outside your home, except for getting medical care. Do not go to work, school, or public areas, and do not use public transportation or taxis.  Call ahead before visiting your doctor Before your medical appointment, call the healthcare provider and tell them that you have, or are being evaluated for, COVID-19 infection. This will help the healthcare providers office take steps to keep other people from getting infected. Ask your healthcare provider to call the local or state health department.  Monitor your symptoms Seek prompt medical attention if your illness is worsening (e.g., difficulty breathing). Before going to your medical appointment, call the healthcare provider and tell them that you have, or are being evaluated for, COVID-19 infection. Ask your healthcare provider to call the local or state health department.  Wear a facemask You should wear a facemask that covers your nose and mouth when you are in the same room with other people and when you visit a healthcare provider. People who live with or visit you should also wear a facemask while they are in the same room with you.  Separate yourself from other people in your home As much as possible, you should stay in a different room from other people in your home. Also, you should use a separate bathroom, if available.  Avoid sharing household items You should not  share dishes, drinking glasses, cups, eating utensils, towels, bedding, or other items with other people in your home. After using these items, you should wash them thoroughly with soap and water.  Cover your coughs and sneezes Cover your mouth and nose with a tissue when you cough or sneeze, or you can cough or sneeze into your sleeve. Throw used tissues in a lined trash can, and immediately wash your hands with soap and water for at least 20 seconds or use an alcohol-based hand rub.  Wash your [facility] your hands often and thoroughly with soap and water for at least 20 seconds. You can use an alcohol-based hand sanitizer if soap and water are not available and if your hands are not visibly dirty. Avoid touching your eyes, nose, and mouth with unwashed hands.   Prevention Steps for Caregivers and Household Members of Individuals Confirmed to have, or Being Evaluated for, COVID-19 Infection Being Cared for in the Home  If you live with, or provide care at home for, a person confirmed to have, or being evaluated for, COVID-19 infection please follow these guidelines to prevent infection:  Follow healthcare providers instructions Make sure that you understand and can help the patient follow any healthcare provider instructions for all care.  Provide for the patients basic needs You should help the patient with basic needs in the home and provide support for getting groceries, prescriptions, and other personal needs.  Monitor the patients symptoms If they are getting sicker, call his or her medical provider and tell them that the patient has, or is being evaluated for, COVID-19 infection. This will help the healthcare providers  office take steps to keep other people from getting infected. Ask the healthcare provider to call the local or state health department.  Limit the number of people who have contact with the patient  If possible, have only one caregiver for the  patient.  Other household members should stay in another home or place of residence. If this is not possible, they should stay  in another room, or be separated from the patient as much as possible. Use a separate bathroom, if available.  Restrict visitors who do not have an essential need to be in the home.  Keep older adults, very young children, and other sick people away from the patient Keep older adults, very young children, and those who have compromised immune systems or chronic health conditions away from the patient. This includes people with chronic heart, lung, or kidney conditions, diabetes, and cancer.  Ensure good ventilation Make sure that shared spaces in the home have good air flow, such as from an air conditioner or an opened window, weather permitting.  Wash your hands often  Wash your hands often and thoroughly with soap and water for at least 20 seconds. You can use an alcohol based hand sanitizer if soap and water are not available and if your hands are not visibly dirty.  Avoid touching your eyes, nose, and mouth with unwashed hands.  Use disposable paper towels to dry your hands. If not available, use dedicated cloth towels and replace them when they become wet.  Wear a facemask and gloves  Wear a disposable facemask at all times in the room and gloves when you touch or have contact with the patients blood, body fluids, and/or secretions or excretions, such as sweat, saliva, sputum, nasal mucus, vomit, urine, or feces.  Ensure the mask fits over your nose and mouth tightly, and do not touch it during use.  Throw out disposable facemasks and gloves after using them. Do not reuse.  Wash your hands immediately after removing your facemask and gloves.  If your personal clothing becomes contaminated, carefully remove clothing and launder. Wash your hands after handling contaminated clothing.  Place all used disposable facemasks, gloves, and other waste in a lined  container before disposing them with other household waste.  Remove gloves and wash your hands immediately after handling these items.  Do not share dishes, glasses, or other household items with the patient  Avoid sharing household items. You should not share dishes, drinking glasses, cups, eating utensils, towels, bedding, or other items with a patient who is confirmed to have, or being evaluated for, COVID-19 infection.  After the person uses these items, you should wash them thoroughly with soap and water.  Wash laundry thoroughly  Immediately remove and wash clothes or bedding that have blood, body fluids, and/or secretions or excretions, such as sweat, saliva, sputum, nasal mucus, vomit, urine, or feces, on them.  Wear gloves when handling laundry from the patient.  Read and follow directions on labels of laundry or clothing items and detergent. In general, wash and dry with the warmest temperatures recommended on the label.  Clean all areas the individual has used often  Clean all touchable surfaces, such as counters, tabletops, doorknobs, bathroom fixtures, toilets, phones, keyboards, tablets, and bedside tables, every day. Also, clean any surfaces that may have blood, body fluids, and/or secretions or excretions on them.  Wear gloves when cleaning surfaces the patient has come in contact with.  Use a diluted bleach solution (e.g., dilute bleach with 1  part bleach and 10 parts water) or a household disinfectant with a label that says EPA-registered for coronaviruses. To make a bleach solution at home, add 1 tablespoon of bleach to 1 quart (4 cups) of water. For a larger supply, add  cup of bleach to 1 gallon (16 cups) of water.  Read labels of cleaning products and follow recommendations provided on product labels. Labels contain instructions for safe and effective use of the cleaning product including precautions you should take when applying the product, such as wearing gloves or  eye protection and making sure you have good ventilation during use of the product.  Remove gloves and wash hands immediately after cleaning.  Monitor yourself for signs and symptoms of illness Caregivers and household members are considered close contacts, should monitor their health, and will be asked to limit movement outside of the home to the extent possible. Follow the monitoring steps for close contacts listed on the symptom monitoring form.   ? If you have additional questions, contact your local health department or call the epidemiologist on call at [phone] (available 24/7). ? This guidance is subject to change. For the most up-to-date guidance from CDC, please refer to their website: TripMetro.hu COVID-19 COVID-19 is a respiratory infection that is caused by a virus called severe acute respiratory syndrome coronavirus 2 (SARS-CoV-2). The disease is also known as coronavirus disease or novel coronavirus. In some people, the virus may not cause any symptoms. In others, it may cause a serious infection. The infection can get worse quickly and can lead to complications, such as:  Pneumonia, or infection of the lungs.  Acute respiratory distress syndrome or ARDS. This is fluid build-up in the lungs.  Acute respiratory failure. This is a condition in which there is not enough oxygen passing from the lungs to the body.  Sepsis or septic shock. This is a serious bodily reaction to an infection.  Blood clotting problems.  Secondary infections due to bacteria or fungus. The virus that causes COVID-19 is contagious. This means that it can spread from person to person through droplets from coughs and sneezes (respiratory secretions). What are the causes? This illness is caused by a virus. You may catch the virus by:  Breathing in droplets from an infected person's cough or sneeze.  Touching something, like a table or  a doorknob, that was exposed to the virus (contaminated) and then touching your mouth, nose, or eyes. What increases the risk? Risk for infection You are more likely to be infected with this virus if you:  Live in or travel to an area with a COVID-19 outbreak.  Come in contact with a sick person who recently traveled to an area with a COVID-19 outbreak.  Provide care for or live with a person who is infected with COVID-19. Risk for serious illness You are more likely to become seriously ill from the virus if you:  Are 81 years of age or older.  Have a long-term disease that lowers your body's ability to fight infection (immunocompromised).  Live in a nursing home or long-term care facility.  Have a long-term (chronic) disease such as: ? Chronic lung disease, including chronic obstructive pulmonary disease or asthma ? Heart disease. ? Diabetes. ? Chronic kidney disease. ? Liver disease.  Are obese. What are the signs or symptoms? Symptoms of this condition can range from mild to severe. Symptoms may appear any time from 2 to 14 days after being exposed to the virus. They include:  A fever.  A cough.  Difficulty breathing.  Chills.  Muscle pains.  A sore throat.  Loss of taste or smell. Some people may also have stomach problems, such as nausea, vomiting, or diarrhea. Other people may not have any symptoms of COVID-19. How is this diagnosed? This condition may be diagnosed based on:  Your signs and symptoms, especially if: ? You live in an area with a COVID-19 outbreak. ? You recently traveled to or from an area where the virus is common. ? You provide care for or live with a person who was diagnosed with COVID-19.  A physical exam.  Lab tests, which may include: ? A nasal swab to take a sample of fluid from your nose. ? A throat swab to take a sample of fluid from your throat. ? A sample of mucus from your lungs (sputum). ? Blood tests.  Imaging tests,  which may include, X-rays, CT scan, or ultrasound. How is this treated? At present, there is no medicine to treat COVID-19. Medicines that treat other diseases are being used on a trial basis to see if they are effective against COVID-19. Your health care provider will talk with you about ways to treat your symptoms. For most people, the infection is mild and can be managed at home with rest, fluids, and over-the-counter medicines. Treatment for a serious infection usually takes places in a hospital intensive care unit (ICU). It may include one or more of the following treatments. These treatments are given until your symptoms improve.  Receiving fluids and medicines through an IV.  Supplemental oxygen. Extra oxygen is given through a tube in the nose, a face mask, or a hood.  Positioning you to lie on your stomach (prone position). This makes it easier for oxygen to get into the lungs.  Continuous positive airway pressure (CPAP) or bi-level positive airway pressure (BPAP) machine. This treatment uses mild air pressure to keep the airways open. A tube that is connected to a motor delivers oxygen to the body.  Ventilator. This treatment moves air into and out of the lungs by using a tube that is placed in your windpipe.  Tracheostomy. This is a procedure to create a hole in the neck so that a breathing tube can be inserted.  Extracorporeal membrane oxygenation (ECMO). This procedure gives the lungs a chance to recover by taking over the functions of the heart and lungs. It supplies oxygen to the body and removes carbon dioxide. Follow these instructions at home: Lifestyle  If you are sick, stay home except to get medical care. Your health care provider will tell you how long to stay home. Call your health care provider before you go for medical care.  Rest at home as told by your health care provider.  Do not use any products that contain nicotine or tobacco, such as cigarettes,  e-cigarettes, and chewing tobacco. If you need help quitting, ask your health care provider.  Return to your normal activities as told by your health care provider. Ask your health care provider what activities are safe for you. General instructions  Take over-the-counter and prescription medicines only as told by your health care provider.  Drink enough fluid to keep your urine pale yellow.  Keep all follow-up visits as told by your health care provider. This is important. How is this prevented?  There is no vaccine to help prevent COVID-19 infection. However, there are steps you can take to protect yourself and others from this virus. To protect yourself:  Do not travel to areas where COVID-19 is a risk. The areas where COVID-19 is reported change often. To identify high-risk areas and travel restrictions, check the CDC travel website: StageSync.si  If you live in, or must travel to, an area where COVID-19 is a risk, take precautions to avoid infection. ? Stay away from people who are sick. ? Wash your hands often with soap and water for 20 seconds. If soap and water are not available, use an alcohol-based hand sanitizer. ? Avoid touching your mouth, face, eyes, or nose. ? Avoid going out in public, follow guidance from your state and local health authorities. ? If you must go out in public, wear a cloth face covering or face mask. ? Disinfect objects and surfaces that are frequently touched every day. This may include:  Counters and tables.  Doorknobs and light switches.  Sinks and faucets.  Electronics, such as phones, remote controls, keyboards, computers, and tablets. To protect others: If you have symptoms of COVID-19, take steps to prevent the virus from spreading to others.  If you think you have a COVID-19 infection, contact your health care provider right away. Tell your health care team that you think you may have a COVID-19 infection.  Stay home.  Leave your house only to seek medical care. Do not use public transport.  Do not travel while you are sick.  Wash your hands often with soap and water for 20 seconds. If soap and water are not available, use alcohol-based hand sanitizer.  Stay away from other members of your household. Let healthy household members care for children and pets, if possible. If you have to care for children or pets, wash your hands often and wear a mask. If possible, stay in your own room, separate from others. Use a different bathroom.  Make sure that all people in your household wash their hands well and often.  Cough or sneeze into a tissue or your sleeve or elbow. Do not cough or sneeze into your hand or into the air.  Wear a cloth face covering or face mask. Where to find more information  Centers for Disease Control and Prevention: StickerEmporium.tn  World Health Organization: https://thompson-craig.com/ Contact a health care provider if:  You live in or have traveled to an area where COVID-19 is a risk and you have symptoms of the infection.  You have had contact with someone who has COVID-19 and you have symptoms of the infection. Get help right away if:  You have trouble breathing.  You have pain or pressure in your chest.  You have confusion.  You have bluish lips and fingernails.  You have difficulty waking from sleep.  You have symptoms that get worse. These symptoms may represent a serious problem that is an emergency. Do not wait to see if the symptoms will go away. Get medical help right away. Call your local emergency services (911 in the [place] Do not drive yourself to the hospital. Let the emergency medical personnel know if you think you have COVID-19. Summary  COVID-19 is a respiratory infection that is caused by a virus. It is also known as coronavirus disease or novel coronavirus. It can cause serious infections, such as pneumonia, acute  respiratory distress syndrome, acute respiratory failure, or sepsis.  The virus that causes COVID-19 is contagious. This means that it can spread from person to person through droplets from coughs and sneezes.  You are more likely to develop a serious illness if you are 65 years  of age or older, have a weak immunity, live in a nursing home, or have chronic disease.  There is no medicine to treat COVID-19. Your health care provider will talk with you about ways to treat your symptoms.  Take steps to protect yourself and others from infection. Wash your hands often and disinfect objects and surfaces that are frequently touched every day. Stay away from people who are sick and wear a mask if you are sick. This information is not intended to replace advice given to you by your health care provider. Make sure you discuss any questions you have with your health care provider. Document Released: 08/22/2018 Document Revised: 12/12/2018 Document Reviewed: 08/22/2018 Elsevier Patient Education  2020 Elsevier Inc.  COVID-19: How to Protect Yourself and Others Know how it spreads  There is currently no vaccine to prevent coronavirus disease 2019 (COVID-19).  The best way to prevent illness is to avoid being exposed to this virus.  The virus is thought to spread mainly from person-to-person. ? Between people who are in close contact with one another (within about 6 feet). ? Through respiratory droplets produced when an infected person coughs, sneezes or talks. ? These droplets can land in the mouths or noses of people who are nearby or possibly be inhaled into the lungs. ? Some recent studies have suggested that COVID-19 may be spread by people who are not showing symptoms. Everyone should Clean your hands often  Wash your hands often with soap and water for at least 20 seconds especially after you have been in a public place, or after blowing your nose, coughing, or sneezing.  If soap and water  are not readily available, use a hand sanitizer that contains at least 60% alcohol. Cover all surfaces of your hands and rub them together until they feel dry.  Avoid touching your eyes, nose, and mouth with unwashed hands. Avoid close contact  Stay home if you are sick.  Avoid close contact with people who are sick.  Put distance between yourself and other people. ? Remember that some people without symptoms may be able to spread virus. ? This is especially important for people who are at higher risk of getting very RetroStamps.it Cover your mouth and nose with a cloth face cover when around others  You could spread COVID-19 to others even if you do not feel sick.  Everyone should wear a cloth face cover when they have to go out in public, for example to the grocery store or to pick up other necessities. ? Cloth face coverings should not be placed on young children under age 63, anyone who has trouble breathing, or is unconscious, incapacitated or otherwise unable to remove the mask without assistance.  The cloth face cover is meant to protect other people in case you are infected.  Do NOT use a facemask meant for a Research scientist (physical sciences).  Continue to keep about 6 feet between yourself and others. The cloth face cover is not a substitute for social distancing. Cover coughs and sneezes  If you are in a private setting and do not have on your cloth face covering, remember to always cover your mouth and nose with a tissue when you cough or sneeze or use the inside of your elbow.  Throw used tissues in the trash.  Immediately wash your hands with soap and water for at least 20 seconds. If soap and water are not readily available, clean your hands with a hand sanitizer that contains at least 60%  alcohol. Clean and disinfect  Clean AND disinfect frequently touched surfaces daily. This includes tables, doorknobs, light  switches, countertops, handles, desks, phones, keyboards, toilets, faucets, and sinks. ktimeonline.comwww.cdc.gov/coronavirus/2019-ncov/prevent-getting-sick/disinfecting-your-home.html  If surfaces are dirty, clean them: Use detergent or soap and water prior to disinfection.  Then, use a household disinfectant. You can see a list of EPA-registered household disinfectants here. SouthAmericaFlowers.co.ukcdc.gov/coronavirus 12/03/2018 This information is not intended to replace advice given to you by your health care provider. Make sure you discuss any questions you have with your health care provider. Document Released: 11/12/2018 Document Revised: 12/11/2018 Document Reviewed: 11/12/2018 Elsevier Patient Education  2020 [employer]   COVID-19 Frequently Asked Questions COVID-19 (coronavirus disease) is an infection that is caused by a large family of viruses. Some viruses cause illness in people and others cause illness in animals like camels, cats, and bats. In some cases, the viruses that cause illness in animals can spread to humans. Where did the coronavirus come from? In December 2019, Armeniahina told the [employer] Health [employer] [facility](WHO) of several cases of lung disease (human respiratory illness). These cases were linked to an open seafood and [place]. The link to the seafood and livestock market suggests that the virus may have spread from animals to humans. However, since that first outbreak in December, the virus has also been shown to spread from person to person. What is the name of the disease and the virus? Disease name Early on, this disease was called novel coronavirus. This is because scientists determined that the disease was caused by a new (novel) respiratory virus. The World Health Organization [facility](WHO) has now named the disease COVID-19, or coronavirus disease. Virus name The virus that causes the disease is called severe acute respiratory syndrome coronavirus 2 (SARS-CoV-2). More  information on disease and virus naming World Health Organization [facility](WHO): www.who.int/emergencies/diseases/novel-coronavirus-2019/technical-guidance/naming-the-coronavirus-disease-(covid-2019)-and-the-virus-that-causes-it Who is at risk for complications from coronavirus disease? Some people may be at higher risk for complications from coronavirus disease. This includes older adults and people who have chronic diseases, such as heart disease, diabetes, and lung disease. If you are at higher risk for complications, take these extra precautions:  Avoid close contact with people who are sick or have a fever or cough. Stay at least 3-6 ft (1-2 m) away from them, if possible.  Wash your hands often with soap and water for at least 20 seconds.  Avoid touching your face, mouth, nose, or eyes.  Keep supplies on hand at home, such as food, medicine, and cleaning supplies.  Stay home as much as possible.  Avoid social gatherings and travel. How does coronavirus disease spread? The virus that causes coronavirus disease spreads easily from person to person (is contagious). There are also cases of community-spread disease. This means the disease has spread to:  People who have no known contact with other infected people.  People who have not traveled to areas where there are known cases. It appears to spread from one person to another through droplets from coughing or sneezing. Can I get the virus from touching surfaces or objects? There is still a lot that we do not know about the virus that causes coronavirus disease. Scientists are basing a lot of information on what they know about similar viruses, such as:  Viruses cannot generally survive on surfaces for long. They need a human body (host) to survive.  It is more likely that the virus is spread by close contact with people who are sick (direct contact), such  as through: ? Shaking hands or hugging. ? Breathing in respiratory droplets that  travel through the air. This can happen when an infected person coughs or sneezes on or near other people.  It is less likely that the virus is spread when a person touches a surface or object that has the virus on it (indirect contact). The virus may be able to enter the body if the person touches a surface or object and then touches his or her face, eyes, nose, or mouth. Can a person spread the virus without having symptoms of the disease? It may be possible for the virus to spread before a person has symptoms of the disease, but this is most likely not the main way the virus is spreading. It is more likely for the virus to spread by being in close contact with people who are sick and breathing in the respiratory droplets of a sick person's cough or sneeze. What are the symptoms of coronavirus disease? Symptoms vary from person to person and can range from mild to severe. Symptoms may include:  Fever.  Cough.  Tiredness, weakness, or fatigue.  Fast breathing or feeling short of breath. These symptoms can appear anywhere from 2 to 14 days after you have been exposed to the virus. If you develop symptoms, call your health care provider. People with severe symptoms may need hospital care. If I am exposed to the virus, how long does it take before symptoms start? Symptoms of coronavirus disease may appear anywhere from 2 to 14 days after a person has been exposed to the virus. If you develop symptoms, call your health care provider. Should I be tested for this virus? Your health care provider will decide whether to test you based on your symptoms, history of exposure, and your risk factors. How does a health care provider test for this virus? Health care providers will collect samples to send for testing. Samples may include:  Taking a swab of fluid from the nose.  Taking fluid from the lungs by having you cough up mucus (sputum) into a sterile cup.  Taking a blood sample.  Taking a stool  or urine sample. Is there a treatment or vaccine for this virus? Currently, there is no vaccine to prevent coronavirus disease. Also, there are no medicines like antibiotics or antivirals to treat the virus. A person who becomes sick is given supportive care, which means rest and fluids. A person may also relieve his or her symptoms by using over-the-counter medicines that treat sneezing, coughing, and runny nose. These are the same medicines that a person takes for the common cold. If you develop symptoms, call your health care provider. People with severe symptoms may need hospital care. What can I do to protect myself and my family from this virus?     You can protect yourself and your family by taking the same actions that you would take to prevent the spread of other viruses. Take the following actions:  Wash your hands often with soap and water for at least 20 seconds. If soap and water are not available, use alcohol-based hand sanitizer.  Avoid touching your face, mouth, nose, or eyes.  Cough or sneeze into a tissue, sleeve, or elbow. Do not cough or sneeze into your hand or the air. ? If you cough or sneeze into a tissue, throw it away immediately and wash your hands.  Disinfect objects and surfaces that you frequently touch every day.  Avoid close contact with people who  are sick or have a fever or cough. Stay at least 3-6 ft (1-2 m) away from them, if possible.  Stay home if you are sick, except to get medical care. Call your health care provider before you get medical care.  Make sure your vaccines are up to date. Ask your health care provider what vaccines you need. What should I do if I need to travel? Follow travel recommendations from your local health authority, the CDC, and WHO. Travel information and advice  Centers for Disease Control and Prevention (CDC): GeminiCard.gl  World Health Organization [facility]):  PreviewDomains.se Know the risks and take action to protect your health  You are at higher risk of getting coronavirus disease if you are traveling to areas with an outbreak or if you are exposed to travelers from areas with an outbreak.  Wash your hands often and practice good hygiene to lower the risk of catching or spreading the virus. What should I do if I am sick? General instructions to stop the spread of infection  Wash your hands often with soap and water for at least 20 seconds. If soap and water are not available, use alcohol-based hand sanitizer.  Cough or sneeze into a tissue, sleeve, or elbow. Do not cough or sneeze into your hand or the air.  If you cough or sneeze into a tissue, throw it away immediately and wash your hands.  Stay home unless you must get medical care. Call your health care provider or local health authority before you get medical care.  Avoid public areas. Do not take public transportation, if possible.  If you can, wear a mask if you must go out of the house or if you are in close contact with someone who is not sick. Keep your home clean  Disinfect objects and surfaces that are frequently touched every day. This may include: ? Counters and tables. ? Doorknobs and light switches. ? Sinks and faucets. ? Electronics such as phones, remote controls, keyboards, computers, and tablets.  Wash dishes in hot, soapy water or use a dishwasher. Air-dry your dishes.  Wash laundry in hot water. Prevent infecting other household members  Let healthy household members care for children and pets, if possible. If you have to care for children or pets, wash your hands often and wear a mask.  Sleep in a different bedroom or bed, if possible.  Do not share personal items, such as razors, toothbrushes, deodorant, combs, brushes, towels, and washcloths. Where to find more information Centers for Disease Control and  Prevention (CDC)  Information and news updates: CardRetirement.cz World Health Organization [facility])  Information and news updates: AffordableSalon.es  Coronavirus health topic: https://thompson-craig.com/  Questions and answers on COVID-19: kruiseway.com  Global tracker: who.sprinklr.com American Academy of Pediatrics (AAP)  Information for families: www.healthychildren.org/English/health-issues/conditions/chest-lungs/Pages/2019-Novel-Coronavirus.aspx The coronavirus situation is changing rapidly. Check your local health authority website or the CDC and [facility] websites for updates and news. When should I contact a health care provider?  Contact your health care provider if you have symptoms of an infection, such as fever or cough, and you: ? Have been near anyone who is known to have coronavirus disease. ? Have come into contact with a person who is suspected to have coronavirus disease. ? Have traveled outside of the country. When should I get emergency medical care?  Get help right away by calling your local emergency services (911 in the [place] if you have: ? Trouble breathing. ? Pain or pressure in your chest. ? Confusion. ?  Blue-tinged lips and fingernails. ? Difficulty waking from sleep. ? Symptoms that get worse. Let the emergency medical personnel know if you think you have coronavirus disease. Summary  A new respiratory virus is spreading from person to person and causing COVID-19 (coronavirus disease).  The virus that causes COVID-19 appears to spread easily. It spreads from one person to another through droplets from coughing or sneezing.  Older adults and those with chronic diseases are at higher risk of disease. If you are at higher risk for complications, take extra precautions.  There is currently no vaccine to prevent coronavirus disease. There are no medicines, such  as antibiotics or antivirals, to treat the virus.  You can protect yourself and your family by washing your hands often, avoiding touching your face, and covering your coughs and sneezes. This information is not intended to replace advice given to you by your health care provider. Make sure you discuss any questions you have with your health care provider. Document Released: 11/12/2018 Document Revised: 11/12/2018 Document Reviewed: 11/12/2018 Elsevier Patient Education  2020 Elsevier Inc.     [employer] Loss Tips  Eat at least three times per day.  Pay attention to your body. When you feel like you have had enough to eat, stop. Quit before you feel full, stuffed, or sick from eating. You can have more if you are really hungry.  If you still feel hungry or unsatisfied after a meal or snack, wait at least 10 minutes before you have more food. Often, the craving will go away.  Drink plenty of calorie-free drinks (water, tea, coffee, diet soda). You may be thirsty, not hungry.  Pick lean meats, low-fat or nonfat cheese, and fat-free (skim) or low-fat (1%) milk instead of higher-fat/higher-calorie choices.  Get plenty of fiber. Vegetables, fruits, and whole grains are good sources. Have a high-fiber cereal every day.  Cut back on sugar. For example, drink less fruit juice and regular soda.  Limit the amount of alcohol (beer, wine, and liquor) that you drink.  Keep all food in the kitchen. Eat only in a chosen place, such as at the table. Dont eat in the car or the bedroom or in front of the TV. Food Preparation  Plan meals ahead of time.  Try cooking methods that cut calories: ? Cook without adding fat (bake, broil, roast, boil). ? Use nonstick cooking sprays instead of butter or oil. You can also use wine, broth, or fruit juice instead of oil when cooking. ? Use low-calorie foods instead of high-calorie ones when possible.  Adriana Simas only what you need for one meal (dont make  leftovers).  If you do make extra portions, put them away as soon as they are ready so you can save them for other meals. Store the leftovers in containers that you cant see through.  Cook when you are not hungry. For example, cook and refrigerate tomorrows dinner after you have finished eating tonight.  Make fruits, vegetables, and other low-calorie foods part of each meal.  Drink water while you cook. Mealtimes  Drink a glass of water before you eat. Drink more during meals.  Use smaller plates, bowls, glasses, and serving spoons.  Divide your plate into four equal parts. Use one part for meat, one for starch (such as pasta, rice, potatoes, or bread), and two for nonstarchy vegetables.  Do not put serving dishes on the table. This will make it harder to take a second portion.  Put salad dressing on the side instead of  mixing it with or pouring onto your salad. Then dip your fork into the dressing before you spear a bite of salad.  Change your usual place at the table.  Make mealtime special by using pretty dishes, napkins, and glasses.  Eat slowly. Take a few 1-minute breaks from eating during meals. Put your fork down between bites. Cut your food one bite at a time.  Enjoy fruit for dessert instead of cake, pie, or other sweets.  Leave a little food on your plate. (You control the food; it doesnt control you.)  Remove your plate as soon as youve finished eating. Snacking Snacking can be part of your plan for healthy weight loss. You can eat six times per day as long as you plan what to eat and dont eat too many calories.  Plan ahead. Be sure to have healthy snacks on hand. If the right food is not there, you may be more likely to eat whatever is available, such as candy, cookies, chips, leftovers, or other quick choices.  Keep low-calorie snacks in a special part of the refrigerator. Good choices include the following: ? Reduced-fat string cheese, low-calorie yogurt,  and fat-free milk. ? Washed, bite-size pieces of raw vegetables, such as carrots, celery, pepper strips, cucumbers, broccoli, and cauliflower. Serve with low-calorie dips. ? Fresh fruit. Eating and Emotions Do you use eating to deal with feelings other than hunger, such as boredom, being tired, or stress? If you eat for these reasons, here are some other things you can try:  Call a friend for support.  Use inspirational quotes to help you avoid the temptation to eat.  Take a warm bath or shower.  Listen to music or a relaxation CD.  Take a walk.  Try activities that keep you from eating. For example, its hard to eat while youre exercising. If you are gardening, you probably wont eat while your hands are covered in soil.

## 2019-06-21 NOTE — Progress Notes (Signed)
Discharge to home with girlfriend. Flu vaccine given to right deltoid, teaching sheet provided. Covid 19 sheet discussed and signed,  agrees to protocol. Prescription and discharge teaching complete, information provided. Exit via wheelchair with 2 staff members.

## 2019-06-21 NOTE — Discharge Summary (Signed)
DISCHARGE SUMMARY  Derrick [name]  MR#: [id number]  DOB:July 01, 1981  Date of Admission: 06/16/2019 Date of Discharge: 06/21/2019  Attending Physician:Rondarius Kadrmas Hennie Duos, MD  Patient's HYQ:[id number], Real Cons, MD  Consults: none  Disposition: D/C home   Follow-up Appts: Follow-up Information    Hoyt Koch, MD Follow up in 5 day(s).   Specialty: Internal Medicine Why: a telehealth visit will be fine  Contact information: [place] [address] [phone]           Tests Needing Follow-up: -recheck K+ in pt on high dose lasix and K+ replacement  -assess O2 saturations   Discharge Diagnoses: Covid pneumonia Acute on chronic hypoxic respiratory failure OSA -OHS Pulmonary hypertension Chronic diastolic CHF Hypokalemia DM 2 Morbid obesity - Body mass index is 59.12 kg/m. Chronic pain syndrome  Initial presentation: 38 year old with a history of chronic hypoxic respiratory failure status post permanent tracheostomy placement, morbid obesity, OSA, DM2, HTN, pulmonary hypertension, and chronic diastolic CHF who presented with 5 days of shortness of breath cough diarrhea and fatigue.  His mother was known to be COVID positive.  In the ED he was hypoxic to 87% on room air. He was confirmed to be COVID positive himself.   Hospital Course:  Covid pneumonia -acute on chronic hypoxic respiratory failure Pt completed a 5 day course of remdesivir - he will complete a 10 day course of decadron, the initial 5 doses being given via IV during his admission -not a candidate for other therapies presently - greatly improved/stabilized at time of d/c - not requiring O2 support during day when awake - has returned to his baseline respiratory status   OSA -OHS -pulmonary hypertension Unable to use CPAP at night while at [facility], which accounts for intermittent nightly desaturations that were noted - no clinical evidence of hypercarbia - resp  status stable at time of d/c - to resume usual CPAP regimen at home   Chronic diastolic CHF net negative approximately 3L this admit - no peripheral edema at time of d/c - to continue usual diuretic regimen upon returning home   Hypokalemia Due to diuretic use - additional supplementation required during hospital stay - resume prior dosing upon d/c home - will need K+ check in 5-7 days   DM 2 A1c 7.0 -CBG reasonably controlled during this stay despite use of steroids   Morbid obesity - Body mass index is 59.12 kg/m.  Chronic pain syndrome Well compensated during this admit   Allergies as of 06/21/2019      Reactions   Shrimp [shellfish Allergy]    Vancomycin Other (See Comments)   Zosyn [piperacillin Sod-tazobactam So] Other (See Comments)      Medication List    STOP taking these medications   doxycycline 100 MG tablet Commonly known as: VIBRA-TABS     TAKE these medications   acetaminophen 325 MG tablet Commonly known as: TYLENOL Take 2 tablets (650 mg total) by mouth every 6 (six) hours as needed for mild pain or headache (fever >/= 101).   albuterol 108 (90 Base) MCG/ACT inhaler Commonly known as: VENTOLIN HFA Inhale 2 puffs into the lungs every 6 (six) hours as needed for wheezing or shortness of breath.   allopurinol 300 MG tablet Commonly known as: ZYLOPRIM Take 1 tablet (300 mg total) by mouth daily.   Bayer Contour Next Monitor w/Device Kit Use to check blood sugars daily Dx E11.9   clindamycin 1 % gel Commonly known as: CLINDAGEL Apply 1  application topically daily.   dexamethasone 6 MG tablet Commonly known as: DECADRON Take 1 tablet (6 mg total) by mouth daily for 5 days. Start taking on: June 22, 2019   famotidine 20 MG tablet Commonly known as: PEPCID Take 1 tablet (20 mg total) by mouth daily.   fluticasone 110 MCG/ACT inhaler Commonly known as: Flovent HFA Inhale 2 puffs into the lungs 2 (two) times daily. What changed:   when  to take this  reasons to take this   fluticasone 50 MCG/ACT nasal spray Commonly known as: FLONASE Place 2 sprays into both nostrils daily. What changed:   when to take this  reasons to take this   furosemide 80 MG tablet Commonly known as: LASIX Take 1 tablet (80 mg total) by mouth 4 (four) times daily. What changed:   how much to take  when to take this   gabapentin 300 MG capsule Commonly known as: NEURONTIN TAKE 2 CAPSULES BY MOUTH 4 TIMES DAILY What changed:   how much to take  how to take this  when to take this   glucose blood test strip Use as instructed   guaiFENesin 600 MG 12 hr tablet Commonly known as: MUCINEX Take 1 tablet (600 mg total) by mouth 2 (two) times daily.   Januvia 100 MG tablet Generic drug: sitaGLIPtin Take 1 tablet by mouth once daily   levocetirizine 5 MG tablet Commonly known as: XYZAL TAKE 1 TABLET BY MOUTH ONCE DAILY IN THE EVENING   metoprolol tartrate 50 MG tablet Commonly known as: LOPRESSOR Take 1 tablet by mouth twice daily   OneTouch Delica Lancets 27O Misc Use as needed daily   pioglitazone 30 MG tablet Commonly known as: ACTOS Take 1 tablet by mouth once daily   potassium chloride SA 20 MEQ tablet Commonly known as: KLOR-CON Take 1 tablet (20 mEq total) by mouth 3 (three) times daily. What changed: when to take this   sildenafil 100 MG tablet Commonly known as: VIAGRA TAKE 1/2 TO 1 (ONE-HALF TO ONE) TABLET BY MOUTH ONCE DAILY AS NEEDED FOR ERECTILE DYSFUNCTION What changed: See the new instructions.   Tracheostomy Care Kit 30 kits by Does not apply route daily.       Day of Discharge BP (!) 143/89 (BP Location: Left Arm)   Pulse (!) 54   Temp (!) 97.5 F (36.4 C) (Oral)   Resp 18   Ht '5\' 10"'$  (1.778 m)   Wt (!) 186.9 kg   SpO2 96%   BMI 59.12 kg/m   Physical Exam: General: No acute respiratory distress Lungs: Clear to auscultation bilaterally without wheezes or crackles - distant breath  sounds  Cardiovascular: Regular rate and rhythm without murmur  Abdomen: Nontender, nondistended, soft, bowel sounds positive, morbidly obsese Extremities: No significant cyanosis, clubbing, or edema bilateral lower extremities  Basic Metabolic Panel: Recent Labs  Lab 06/17/19 0558 06/18/19 0400 06/19/19 0148 06/20/19 0009 06/21/19 0128  NA 140 140 138 143 142  K 3.5 3.7 3.3* 3.5 3.3*  CL 98 101 98 101 101  CO2 32 30 28 32 30  GLUCOSE 126* 174* 186* 141* 155*  BUN 19 18 21* 21* 22*  CREATININE 1.08 0.87 0.91 0.94 0.89  CALCIUM 8.7* 8.8* 8.4* 8.6* 8.7*  MG 1.9 2.0  --   --   --   PHOS 3.7 3.4  --   --   --     Liver Function Tests: Recent Labs  Lab 06/17/19 0558 06/18/19 0400 06/19/19  0148 06/20/19 0009 06/21/19 0128  AST '26 21 22 20 20  '$ ALT '19 18 19 21 25  '$ ALKPHOS 91 87 81 80 79  BILITOT 0.4 0.2* 0.6 0.4 0.9  PROT 7.3 7.1 7.2 7.1 7.2  ALBUMIN 3.5 3.3* 3.6 3.5 3.7   CBC: Recent Labs  Lab 06/17/19 0558 06/18/19 0400 06/19/19 0148 06/20/19 0009 06/21/19 0128  WBC 4.1 3.6* 5.4 5.6 6.0  NEUTROABS 2.2 2.1 3.5 3.5 3.5  HGB 12.3* 12.3* 12.2* 12.1* 12.4*  HCT 40.0 39.3 39.6 39.6 40.3  MCV 94.6 93.6 94.5 94.1 93.3  PLT 162 162 176 184 196    BNP (last 3 results) Recent Labs    06/17/19 0558  BNP 14.2    ProBNP (last 3 results) Recent Labs    12/27/18 1423 05/30/19 1530  PROBNP 17.0 19.0     CBG: Recent Labs  Lab 06/19/19 2044 06/20/19 0745 06/20/19 2218 06/21/19 0742 06/21/19 1214  GLUCAP 155* 141* 136* 145* 235*    Recent Results (from the past 240 hour(s))  SARS CORONAVIRUS 2 (TAT 6-24 HRS) Nasopharyngeal Nasopharyngeal Swab     Status: Abnormal   Collection Time: 06/16/19  4:18 PM   Specimen: Nasopharyngeal Swab  Result Value Ref Range Status   SARS Coronavirus 2 POSITIVE (A) NEGATIVE Final    Comment: RESULT CALLED TO, READ BACK BY AND VERIFIED WITH: L.ADKINES RN [id number] 06/16/2019 MCCORMICK K (NOTE) SARS-CoV-2 target nucleic acids  are DETECTED. The SARS-CoV-2 RNA is generally detectable in upper and lower respiratory specimens during the acute phase of infection. Positive results are indicative of active infection with SARS-CoV-2. Clinical  correlation with patient history and other diagnostic information is necessary to determine patient infection status. Positive results do  not rule out bacterial infection or co-infection with other viruses. The expected result is Negative. Fact Sheet for Patients: SugarRoll.be Fact Sheet for Healthcare Providers: https://www.woods-mathews.com/ This test is not yet approved or cleared by the [place] FDA and  has been authorized for detection and/or diagnosis of SARS-CoV-2 by FDA under an Emergency Use Authorization (EUA). This EUA will remain  in effect (meaning this test can be used)  for the duration of the COVID-19 declaration under Section 564(b)(1) of the Act, 21 U.S.C. section 360bbb-3(b)(1), unless the authorization is terminated or revoked sooner. Performed at [facility], [place] [address] [place], [place] [address]      Time spent in discharge (includes decision making & examination of pt): 35 minutes  06/21/2019, 1:05 PM   Cherene Altes, MD [facility] Office  [phone]

## 2019-06-23 ENCOUNTER — Encounter: Payer: Self-pay | Admitting: Acute Care

## 2019-06-23 LAB — GLUCOSE, CAPILLARY: Glucose-Capillary: 178 mg/dL — ABNORMAL HIGH (ref 70–99)

## 2019-06-24 ENCOUNTER — Telehealth (INDEPENDENT_AMBULATORY_CARE_PROVIDER_SITE_OTHER): Payer: BLUE CROSS/BLUE SHIELD | Admitting: Internal Medicine

## 2019-06-24 ENCOUNTER — Encounter: Payer: Self-pay | Admitting: Internal Medicine

## 2019-06-24 DIAGNOSIS — U071 COVID-19: Secondary | ICD-10-CM | POA: Diagnosis not present

## 2019-06-24 MED ORDER — ONDANSETRON HCL 4 MG PO TABS: 4.0000 mg | ORAL_TABLET | Freq: Three times a day (TID) | ORAL | 0 refills | Status: DC | PRN

## 2019-06-24 MED ORDER — FLUCONAZOLE 150 MG PO TABS: 150.0000 mg | ORAL_TABLET | ORAL | 0 refills | Status: DC

## 2019-06-24 NOTE — Assessment & Plan Note (Signed)
Overall improving but still symptomatic. Advised of quarantine date end December 7th if feeling well. He wishes to get retested then and put in orders for drive through testing to get done on or after Dec 7th. Rx zofran and diflucan. Talked to him about variable recovery process for individuals and warning signs to watch for.

## 2019-06-24 NOTE — Progress Notes (Signed)
Virtual Visit via Video Note  I connected with Derrick [name] on 06/24/19 at 11:00 AM EST by a video enabled telemedicine application and verified that I am speaking with the correct person using two identifiers.  The patient and the provider were at separate locations throughout the entire encounter.   I discussed the limitations of evaluation and management by telemedicine and the availability of in person appointments. The patient expressed understanding and agreed to proceed. The patient and the provider were the only parties present for the visit unless noted in HPI below.  History of Present Illness: The patient is a 38 y.o. man with visit for hospital follow up (COVID-19 positive with O2 level 87%, in hospital for 5 days and got remdicivir, respiratory status improved, sent home with nocturnal oxygen). Started with mother positive and tested positive on 06/16/19. He is still coughing and SOB with exertion. No more fevers or chills. Has some nausea which is new since leaving hospital. Finally not having diarrhea. Denies blood in stool or abdominal pain. Does not have anything for nausea. Overall it is improving but slowly. Wants to know how long contagious. He also feels he is getting a yeast infection. Has 3 days more of decadron left to take.   Observations/Objective: Appearance: ill, breathing appears normal, coughing during visit but no dyspnea with talking, casual grooming, abdomen does not appear distended, throat not visualized, memory normal, mental status is A and O times 3  Assessment and Plan: See problem oriented charting  Follow Up Instructions: Rx zofran and diflucan  Visit time 25 minutes: greater than 50% of that time was spent in face to face counseling and coordination of care with the patient: counseled about covid-19 and timeline for quarantine and symptom management.   I discussed the assessment and treatment plan with the patient. The patient was provided an  opportunity to ask questions and all were answered. The patient agreed with the plan and demonstrated an understanding of the instructions.   The patient was advised to call back or seek an in-person evaluation if the symptoms worsen or if the condition fails to improve as anticipated.  Derrick Koch, MD

## 2019-06-30 ENCOUNTER — Telehealth: Payer: Self-pay

## 2019-06-30 NOTE — Telephone Encounter (Signed)
Informed patient of MD response and he stated understanding. States that the ED saw something on his lungs was told had some pneumonia but was not treated for it.

## 2019-06-30 NOTE — Telephone Encounter (Signed)
As we talked about this could go on weeks to months. With covid-19 the course is uncertain. If having worsening breathing problems needs to go back to ER.

## 2019-06-30 NOTE — Telephone Encounter (Signed)
He had pneumonia associated with covid-19 and got treated for this in the hospital while he was there.

## 2019-06-30 NOTE — Telephone Encounter (Signed)
Copied from [place] [id number]. Topic: General - Other >> Jun 30, 2019 11:05 AM Rainey Pines A wrote: Patient was advised to callback and inform Dr. Sharlet Salina of progress. Since patients appointment his congestion has not improved. Please advise

## 2019-07-07 ENCOUNTER — Ambulatory Visit: Payer: BLUE CROSS/BLUE SHIELD | Admitting: Internal Medicine

## 2019-07-07 ENCOUNTER — Ambulatory Visit (INDEPENDENT_AMBULATORY_CARE_PROVIDER_SITE_OTHER): Payer: BLUE CROSS/BLUE SHIELD | Admitting: Family

## 2019-07-07 DIAGNOSIS — U071 COVID-19: Secondary | ICD-10-CM

## 2019-07-07 MED ORDER — BENZONATATE 100 MG PO CAPS: 200.0000 mg | ORAL_CAPSULE | Freq: Three times a day (TID) | ORAL | 0 refills | Status: DC | PRN

## 2019-07-07 MED ORDER — AZITHROMYCIN 250 MG PO TABS: ORAL_TABLET | ORAL | 0 refills | Status: DC

## 2019-07-07 NOTE — Progress Notes (Signed)
Derrick [name] is a 38 y.o. male with the following history as recorded in EpicCare:  Patient Active Problem List   Diagnosis Date Noted  . COVID-19 virus infection 06/17/2019  . Chronic respiratory failure with hypoxia ([place]) 06/17/2019  . Acute on chronic respiratory failure with hypoxia ([place]) 06/17/2019  . Chronic pain syndrome 06/17/2019  . Left leg pain 04/01/2019  . Vitamin D deficiency 11/12/2017  . Tinnitus 09/27/2017  . Numbness and tingling of hand 11/30/2016  . Tracheostomy dependence ([place])   . Routine general medical examination at a health care facility 09/21/2016  . Acne 09/21/2016  . ED (erectile dysfunction) 06/02/2016  . Tracheostomy status ([place])   . Cough   . Other allergic rhinitis   . Diabetes mellitus type 2 with complications ([place]) 81/85/6314  . Left ankle pain 08/11/2015  . Dysphagia   . Pulmonary hypertension ([place])   . Morbid obesity ([place]) 06/13/2015  . Essential hypertension 06/13/2015  . OSA (obstructive sleep apnea) 06/13/2015  . Obesity hypoventilation syndrome ([place]) 06/13/2015  . Chronic diastolic heart failure ([place]) 06/13/2015    Current Outpatient Medications  Medication Sig Dispense Refill  . acetaminophen (TYLENOL) 325 MG tablet Take 2 tablets (650 mg total) by mouth every 6 (six) hours as needed for mild pain or headache (fever >/= 101).    Marland Kitchen albuterol (PROVENTIL HFA;VENTOLIN HFA) 108 (90 Base) MCG/ACT inhaler Inhale 2 puffs into the lungs every 6 (six) hours as needed for wheezing or shortness of breath. 1 Inhaler 0  . allopurinol (ZYLOPRIM) 300 MG tablet Take 1 tablet (300 mg total) by mouth daily. 90 tablet 0  . azithromycin (ZITHROMAX) 250 MG tablet 2 tabs po qd x 1 day; 1 tablet per day x 4 days; 6 tablet 0  . benzonatate (TESSALON PERLES) 100 MG capsule Take 2 capsules (200 mg total) by mouth 3 (three) times daily as needed for cough. 30 capsule 0  . Blood Glucose Monitoring Suppl (BAYER CONTOUR NEXT MONITOR) w/Device KIT Use to check  blood sugars daily Dx E11.9 1 kit 0  . clindamycin (CLINDAGEL) 1 % gel Apply 1 application topically daily.    . famotidine (PEPCID) 20 MG tablet Take 1 tablet (20 mg total) by mouth daily. 90 tablet 3  . fluconazole (DIFLUCAN) 150 MG tablet Take 1 tablet (150 mg total) by mouth every 3 (three) days. 3 tablet 0  . fluticasone (FLONASE) 50 MCG/ACT nasal spray Place 2 sprays into both nostrils daily. (Patient taking differently: Place 2 sprays into both nostrils daily as needed for allergies. ) 16 g 6  . fluticasone (FLOVENT HFA) 110 MCG/ACT inhaler Inhale 2 puffs into the lungs 2 (two) times daily. (Patient taking differently: Inhale 2 puffs into the lungs 2 (two) times daily as needed (shortness of breath). ) 1 Inhaler 2  . furosemide (LASIX) 80 MG tablet Take 1 tablet (80 mg total) by mouth 4 (four) times daily. (Patient taking differently: Take 160 mg by mouth 2 (two) times daily. ) 120 tablet 6  . gabapentin (NEURONTIN) 300 MG capsule TAKE 2 CAPSULES BY MOUTH 4 TIMES DAILY (Patient taking differently: Take 1,200 mg by mouth 2 (two) times daily. TAKE 2 CAPSULES BY MOUTH 4 TIMES DAILY) 240 capsule 0  . glucose blood test strip Use as instructed 100 each 12  . guaiFENesin (MUCINEX) 600 MG 12 hr tablet Take 1 tablet (600 mg total) by mouth 2 (two) times daily. 60 tablet 11  . JANUVIA 100 MG tablet Take 1 tablet by  mouth once daily 90 tablet 1  . levocetirizine (XYZAL) 5 MG tablet TAKE 1 TABLET BY MOUTH ONCE DAILY IN THE EVENING 30 tablet 6  . metoprolol tartrate (LOPRESSOR) 50 MG tablet Take 1 tablet by mouth twice daily 180 tablet 1  . ondansetron (ZOFRAN) 4 MG tablet Take 1 tablet (4 mg total) by mouth every 8 (eight) hours as needed for nausea or vomiting. 20 tablet 0  . ONETOUCH DELICA LANCETS 27N MISC Use as needed daily 100 each 11  . pioglitazone (ACTOS) 30 MG tablet Take 1 tablet by mouth once daily 90 tablet 1  . potassium chloride SA (K-DUR) 20 MEQ tablet Take 1 tablet (20 mEq total) by  mouth 3 (three) times daily. (Patient taking differently: Take 20 mEq by mouth 2 (two) times daily. ) 270 tablet 3  . sildenafil (VIAGRA) 100 MG tablet TAKE 1/2 TO 1 (ONE-HALF TO ONE) TABLET BY MOUTH ONCE DAILY AS NEEDED FOR ERECTILE DYSFUNCTION (Patient taking differently: Take 50-100 mg by mouth as needed for erectile dysfunction. ) 4 tablet 0  . Tracheostomy Care KIT 30 kits by Does not apply route daily. 30 each 6   No current facility-administered medications for this visit.     Allergies: Shrimp [shellfish allergy], Vancomycin, and Zosyn [piperacillin sod-tazobactam so]  Past Medical History:  Diagnosis Date  . Acute on chronic diastolic CHF (congestive heart failure), NYHA class 1 ([place])   . Diabetes mellitus without complication ([place])   . GERD (gastroesophageal reflux disease)   . Hypertension   . Hypoxemia   . Morbid obesity ([place])   . Obesity hypoventilation syndrome ([place])   . OSA (obstructive sleep apnea)   . Reflux     Past Surgical History:  Procedure Laterality Date  . TRACHEOSTOMY TUBE PLACEMENT N/A 06/21/2015   Procedure: TRACHEOSTOMY;  Surgeon: Leta Baptist, MD;  Location: [facility] OR;  Service: ENT;  Laterality: N/A;    Family History  Problem Relation Age of Onset  . Diabetes Maternal Grandmother   . Hypertension Maternal Grandmother   . Asthma Other   . Cancer Other   . Stroke Other   . Heart disease Other     Social History   Tobacco Use  . Smoking status: Never Smoker  . Smokeless tobacco: Never Used  Substance Use Topics  . Alcohol use: No    Subjective:    I connected with Derrick [name] on 07/08/19 at 11:20 AM EST by a video enabled telemedicine application and verified that I am speaking with the correct person using two identifiers.  Provider in office/ patient is at home; provider and patient are only 2 people on video call.    I discussed the limitations of evaluation and management by telemedicine and the availability of in person appointments. The  patient expressed understanding and agreed to proceed.  Was diagnosed with COVID and hospitalized on November 16; notes he is feeling better but still has lingering cough; no fever or difficulty breathing; not currently using albuterol or Flovent that are on his medication list;    Objective:  There were no vitals filed for this visit.  General: Well developed, well nourished, in no acute distress  Lungs: Respirations unlabored;  Neurologic: Alert and oriented; speech intact; face symmetrical;   Assessment:  1. COVID-19 virus infection     Plan:  Difficult to determine if this is secondary from the infection or new bronchitis; will go ahead and treat with Z-pak and Tessalon Perles; he is encouraged to use  his Flovent 2x per day as directed; will check on patient in 48 hours; if no improvement, will need CXR but will have to go to [facility] to get this done.   No follow-ups on file.  No orders of the defined types were placed in this encounter.   Requested Prescriptions   Signed Prescriptions Disp Refills  . azithromycin (ZITHROMAX) 250 MG tablet 6 tablet 0    Sig: 2 tabs po qd x 1 day; 1 tablet per day x 4 days;  . benzonatate (TESSALON PERLES) 100 MG capsule 30 capsule 0    Sig: Take 2 capsules (200 mg total) by mouth 3 (three) times daily as needed for cough.

## 2019-07-09 ENCOUNTER — Telehealth: Payer: Self-pay | Admitting: Family

## 2019-07-09 NOTE — Telephone Encounter (Signed)
Please call and check on him; if he is still having symptoms, needs to go to [facility] for CXR; cannot come here.

## 2019-07-09 NOTE — Telephone Encounter (Signed)
Spoke with patient today. States he is feeling better and will keep eye out for symptoms. He knows to go to [facility] is symptoms get worse for chest xray.

## 2019-08-13 ENCOUNTER — Other Ambulatory Visit: Payer: Self-pay | Admitting: Family

## 2019-08-13 DIAGNOSIS — E118 Type 2 diabetes mellitus with unspecified complications: Secondary | ICD-10-CM

## 2019-09-02 ENCOUNTER — Other Ambulatory Visit: Payer: Self-pay | Admitting: Internal Medicine

## 2019-09-04 ENCOUNTER — Other Ambulatory Visit: Payer: Self-pay | Admitting: Internal Medicine

## 2019-09-12 ENCOUNTER — Other Ambulatory Visit: Payer: Self-pay | Admitting: Internal Medicine

## 2019-09-12 DIAGNOSIS — E118 Type 2 diabetes mellitus with unspecified complications: Secondary | ICD-10-CM

## 2019-09-19 ENCOUNTER — Other Ambulatory Visit: Payer: Self-pay | Admitting: Internal Medicine

## 2019-10-13 ENCOUNTER — Other Ambulatory Visit (HOSPITAL_COMMUNITY)
Admission: RE | Admit: 2019-10-13 | Discharge: 2019-10-13 | Disposition: A | Discharge status: Home / Self Care | Payer: BLUE CROSS/BLUE SHIELD | Source: Ambulatory Visit | Attending: Acute Care | Admitting: Acute Care

## 2019-10-13 DIAGNOSIS — Z20822 Contact with and (suspected) exposure to covid-19: Secondary | ICD-10-CM | POA: Diagnosis not present

## 2019-10-13 DIAGNOSIS — Z01812 Encounter for preprocedural laboratory examination: Secondary | ICD-10-CM | POA: Insufficient documentation

## 2019-10-13 LAB — SARS CORONAVIRUS 2 (TAT 6-24 HRS): SARS Coronavirus 2: NEGATIVE

## 2019-10-15 ENCOUNTER — Other Ambulatory Visit: Payer: Self-pay

## 2019-10-15 ENCOUNTER — Ambulatory Visit (HOSPITAL_COMMUNITY)
Admission: RE | Admit: 2019-10-15 | Discharge: 2019-10-15 | Disposition: A | Discharge status: Home / Self Care | Payer: BLUE CROSS/BLUE SHIELD | Source: Ambulatory Visit | Attending: Acute Care | Admitting: Acute Care

## 2019-10-15 DIAGNOSIS — Z930 Tracheostomy status: Secondary | ICD-10-CM | POA: Diagnosis not present

## 2019-10-15 DIAGNOSIS — Z8616 Personal history of COVID-19: Secondary | ICD-10-CM | POA: Insufficient documentation

## 2019-10-15 DIAGNOSIS — Z430 Encounter for attention to tracheostomy: Secondary | ICD-10-CM | POA: Diagnosis present

## 2019-10-15 DIAGNOSIS — R439 Unspecified disturbances of smell and taste: Secondary | ICD-10-CM | POA: Diagnosis not present

## 2019-10-15 DIAGNOSIS — E662 Morbid (severe) obesity with alveolar hypoventilation: Secondary | ICD-10-CM | POA: Diagnosis not present

## 2019-10-15 DIAGNOSIS — I5032 Chronic diastolic (congestive) heart failure: Secondary | ICD-10-CM | POA: Insufficient documentation

## 2019-10-15 NOTE — Progress Notes (Signed)
[facility]   Reason for visit:  Routine trach change  HPI:   39 year old male. Well known to me. Follow for trach dependence d/t OSA/OHS. Hospitalized in Nov for COVID. Now essentially fully at baseline w/ the exception that he still has loss of smell. Presents today for routine trach care.   I see he has been seen at [facility] for bariatric surgery. Looks like plan is for surgery when he gets back to his pre-acute illness (pre-covid) weight. This is around 420 lbs.   ROS  Review of Systems - History obtained from the patient General ROS: positive for  - weight gain negative for - chills, fatigue, fever, malaise, sleep disturbance or weight loss Psychological ROS: negative ENT ROS: negative Endocrine ROS: negative for - malaise/lethargy, mood swings, polydipsia/polyuria or skin changes Respiratory ROS: no cough, shortness of breath, or wheezing Cardiovascular ROS: no chest pain or dyspnea on exertion Gastrointestinal ROS: no abdominal pain, change in bowel habits, or black or bloody stools Musculoskeletal ROS: negative Neurological ROS: no TIA or stroke symptoms  Vital signs:  HR 84, RR 20 sats 96% room air  Exam:  Physical Exam Constitutional:      General: He is not in acute distress.    Appearance: Normal appearance. He is not ill-appearing, toxic-appearing or diaphoretic.  HENT:     Head: Normocephalic and atraumatic.     Comments: Trach site unremarkable     Nose: Nose normal.  Eyes:     Pupils: Pupils are equal, round, and reactive to light.  Cardiovascular:     Rate and Rhythm: Normal rate and regular rhythm.     Pulses: Normal pulses.  Pulmonary:     Effort: Pulmonary effort is normal.     Breath sounds: Normal breath sounds.  Abdominal:     General: Bowel sounds are normal.     Palpations: Abdomen is soft.  Musculoskeletal:        General: Normal range of motion.     Cervical back: Normal range of motion.  Skin:    General: Skin is warm  and dry.     Capillary Refill: Capillary refill takes less than 2 seconds.  Neurological:     General: No focal deficit present.     Mental Status: He is alert.  Psychiatric:        Mood and Affect: Mood normal.     Trach change/procedure: 6 portex bivona. Site unremarkable.       Impression/dx  OHS OSA Trach dependence Chronic Diastolic HF  Discussion  [name] is doing well from trach stand-point. He is working w/ [facility] for future weight loss surgery  Plan  Cont routine trach care F/u for routine change 8 weeks    Visit time: 22 minutes.   [name] ACNP-BC [facility] Pulmonary/Critical Care

## 2019-10-15 NOTE — Progress Notes (Signed)
Tracheostomy Procedure Note  Derrick [name] [id number] 01-May-1981  Pre Procedure Tracheostomy Information  Trach Brand: Portex Bivona Size: 6.0 Style: Uncuffed Secured by: Velcro   Procedure: Trach Cleaning and Cleaning     Post Procedure Tracheostomy Information  Trach Brand: Portex Bivona Size: 6.0 Style: Uncuffed Secured by: Sutures   Post Procedure Evaluation:  ETCO2 positive color change from yellow to purple : Yes.   Vital signs:blood pressure VSS, pulse 84, respirations 20 and pulse oximetry 96 % on RA Patients current condition: stable Complications: No apparent complications Trach site exam: clean and dry Wound care done: dry Patient did tolerate procedure well.   Education: none  Prescription needs: none    Additional needs: New PMV given to patient  F/u appt 8 weeks

## 2019-10-16 ENCOUNTER — Other Ambulatory Visit: Payer: Self-pay | Admitting: Internal Medicine

## 2019-10-16 DIAGNOSIS — E118 Type 2 diabetes mellitus with unspecified complications: Secondary | ICD-10-CM

## 2019-10-16 NOTE — Telephone Encounter (Signed)
   Patient calling to report he has no gabapentin (NEURONTIN) 300 MG capsule remaining

## 2019-10-20 ENCOUNTER — Other Ambulatory Visit: Payer: Self-pay | Admitting: Internal Medicine

## 2019-10-27 ENCOUNTER — Ambulatory Visit: Payer: BLUE CROSS/BLUE SHIELD | Admitting: Internal Medicine

## 2019-11-07 ENCOUNTER — Ambulatory Visit: Payer: BLUE CROSS/BLUE SHIELD | Admitting: Internal Medicine

## 2019-11-10 ENCOUNTER — Other Ambulatory Visit: Payer: Self-pay

## 2019-11-10 ENCOUNTER — Ambulatory Visit: Payer: BLUE CROSS/BLUE SHIELD | Admitting: Internal Medicine

## 2019-11-12 ENCOUNTER — Other Ambulatory Visit: Payer: Self-pay | Admitting: Internal Medicine

## 2019-11-12 DIAGNOSIS — E118 Type 2 diabetes mellitus with unspecified complications: Secondary | ICD-10-CM

## 2019-11-13 ENCOUNTER — Encounter: Payer: Self-pay | Admitting: Internal Medicine

## 2019-11-13 ENCOUNTER — Ambulatory Visit (INDEPENDENT_AMBULATORY_CARE_PROVIDER_SITE_OTHER): Payer: BLUE CROSS/BLUE SHIELD | Admitting: Internal Medicine

## 2019-11-13 ENCOUNTER — Other Ambulatory Visit: Payer: Self-pay

## 2019-11-13 VITALS — BP 138/88 | HR 81 | Temp 98.0°F | Ht 70.0 in | Wt >= 6400 oz

## 2019-11-13 DIAGNOSIS — E118 Type 2 diabetes mellitus with unspecified complications: Secondary | ICD-10-CM

## 2019-11-13 DIAGNOSIS — N528 Other male erectile dysfunction: Secondary | ICD-10-CM

## 2019-11-13 MED ORDER — POTASSIUM CHLORIDE 20 MEQ PO PACK: 20.0000 meq | PACK | Freq: Every day | ORAL | 11 refills | Status: DC

## 2019-11-13 MED ORDER — FUROSEMIDE 80 MG PO TABS: 160.0000 mg | ORAL_TABLET | Freq: Three times a day (TID) | ORAL | 11 refills | Status: DC | PRN

## 2019-11-13 MED ORDER — GABAPENTIN 300 MG PO CAPS: 600.0000 mg | ORAL_CAPSULE | Freq: Three times a day (TID) | ORAL | 6 refills | Status: DC

## 2019-11-13 MED ORDER — MONTELUKAST SODIUM 10 MG PO TABS: 10.0000 mg | ORAL_TABLET | Freq: Every day | ORAL | 3 refills | Status: DC

## 2019-11-13 MED ORDER — TADALAFIL 20 MG PO TABS: 10.0000 mg | ORAL_TABLET | ORAL | 11 refills | Status: DC | PRN

## 2019-11-13 NOTE — Assessment & Plan Note (Signed)
Recent HgA1c is 7.7 which is elevated likely due to recent steroid course. Keep Derrick [name], actos and adjust as needed next visit if no improvement. Had GI intolerance to metformin in the past.

## 2019-11-13 NOTE — Progress Notes (Signed)
   Subjective:   Patient ID: Derrick [name], male    DOB: 1980-08-02, 39 y.o.   MRN: [id number]  HPI The patient is a 39 YO man coming in for follow up of his weight (went to bariatric center and has struggled with weight for a long time, recent extended course of steroids in December for covid-19 caused some weight gain, he is trying to lose weight but seems unable) and diabetes (recent labs with bariatric center HgA1c 7.7, recent course of steroids extended for covid-19 in December, weight has been up since that time despite efforts to lose weight) and ED (has tried viagra in the past and this did not help as well as cialis and would like to try that again, denies side effects).   Review of Systems  Constitutional: Positive for activity change and unexpected weight change.  HENT: Negative.   Eyes: Negative.   Respiratory: Positive for cough and shortness of breath. Negative for chest tightness.   Cardiovascular: Positive for leg swelling. Negative for chest pain and palpitations.  Gastrointestinal: Negative for abdominal distention, abdominal pain, constipation, diarrhea, nausea and vomiting.  Genitourinary:       ED  Musculoskeletal: Negative.   Skin: Negative.   Neurological: Negative.   Psychiatric/Behavioral: Negative.     Objective:  Physical Exam Constitutional:      Appearance: He is well-developed. He is obese.  HENT:     Head: Normocephalic and atraumatic.  Cardiovascular:     Rate and Rhythm: Normal rate and regular rhythm.  Pulmonary:     Effort: Pulmonary effort is normal. No respiratory distress.     Breath sounds: No wheezing or rales.     Comments: Janina Mayo present Abdominal:     General: Bowel sounds are normal. There is no distension.     Palpations: Abdomen is soft.     Tenderness: There is no abdominal tenderness. There is no rebound.  Musculoskeletal:     Cervical back: Normal range of motion.     Right lower leg: Edema present.     Left lower leg:  Edema present.     Comments: stable  Skin:    General: Skin is warm and dry.  Neurological:     Mental Status: He is alert and oriented to person, place, and time.     Coordination: Coordination normal.     Vitals:   11/13/19 0958  BP: 138/88  Pulse: 81  Temp: 98 F (36.7 C)  TempSrc: Oral  SpO2: 96%  Weight: (!) 442 lb (200.5 kg)  Height: 5\' 10"  (1.778 m)    This visit occurred during the SARS-CoV-2 public health emergency.  Safety protocols were in place, including screening questions prior to the visit, additional usage of staff PPE, and extensive cleaning of exam room while observing appropriate contact time as indicated for disinfecting solutions.   Assessment & Plan:

## 2019-11-13 NOTE — Assessment & Plan Note (Signed)
Weight is up due to pandemic, covid-19 infection and recent steroid course. He is working on weight reduction but has struggled with this a lot. Seen at bariatric center and not sure about pursuit of procedure.

## 2019-11-13 NOTE — Assessment & Plan Note (Signed)
rx cialis as he feels this worked better for him

## 2019-11-24 ENCOUNTER — Encounter: Payer: Self-pay | Admitting: Internal Medicine

## 2019-11-24 ENCOUNTER — Other Ambulatory Visit: Payer: Self-pay

## 2019-11-24 ENCOUNTER — Ambulatory Visit (HOSPITAL_COMMUNITY)
Admission: RE | Admit: 2019-11-24 | Discharge: 2019-11-24 | Disposition: A | Discharge status: Home / Self Care | Payer: BLUE CROSS/BLUE SHIELD | Source: Ambulatory Visit | Attending: Cardiology | Admitting: Cardiology

## 2019-11-24 ENCOUNTER — Ambulatory Visit (INDEPENDENT_AMBULATORY_CARE_PROVIDER_SITE_OTHER): Payer: BLUE CROSS/BLUE SHIELD | Admitting: Internal Medicine

## 2019-11-24 VITALS — BP 148/90 | HR 88 | Temp 99.4°F | Ht 70.0 in | Wt >= 6400 oz

## 2019-11-24 DIAGNOSIS — M7989 Other specified soft tissue disorders: Secondary | ICD-10-CM

## 2019-11-24 MED ORDER — HYDROCODONE-ACETAMINOPHEN 5-325 MG PO TABS: 1.0000 | ORAL_TABLET | Freq: Three times a day (TID) | ORAL | 0 refills | Status: DC | PRN

## 2019-11-24 MED ORDER — CEPHALEXIN 500 MG PO CAPS: 500.0000 mg | ORAL_CAPSULE | Freq: Two times a day (BID) | ORAL | 0 refills | Status: AC

## 2019-11-24 NOTE — Assessment & Plan Note (Signed)
Rx keflex for possible early cellulitis, hydrocodone for pain after review of [place] narcotic database. Ordered DVT US to rule out and this will be done today. Treat as appropriate.

## 2019-11-24 NOTE — Patient Instructions (Addendum)
We will check the ultrasound today at 3:45 - [facility] [address]  of the leg to check for blood clots.   We have sent in keflex to take 1 pill twice a day for 1 week in case you have a skin infection starting.  We have sent in hydrocodone for pain to use up to 3 times a day as needed.

## 2019-11-24 NOTE — Progress Notes (Signed)
   Subjective:   Patient ID: Derrick [name], male    DOB: 09/26/1980, 39 y.o.   MRN: [id number]  HPI The patient is a 39 YO man coming in for concerns about left leg pain. Twisted his foot last Thursday by stepping into a hole. Denies instant pain afterwards or snapping or breaking sound. Was able to walk on it afterwards. Not much swelling even the next day. Pain was mild the following day. Over the weekend he did rest and started getting more swelling in the leg which is when the pain increased. There is some redness on the skin of the leg as well as the swelling. Denies fevers or chills. Overall pain not improving and 7/10. Pain is mostly in the calf of the left leg and into the left thigh.   Review of Systems  Constitutional: Negative.   HENT: Negative.   Eyes: Negative.   Respiratory: Negative for cough, chest tightness and shortness of breath.   Cardiovascular: Negative for chest pain, palpitations and leg swelling.  Gastrointestinal: Negative for abdominal distention, abdominal pain, constipation, diarrhea, nausea and vomiting.  Musculoskeletal: Positive for arthralgias, gait problem, joint swelling and myalgias.  Skin: Negative.   Psychiatric/Behavioral: Negative.     Objective:  Physical Exam Constitutional:      Appearance: He is well-developed. He is obese.  HENT:     Head: Normocephalic and atraumatic.  Cardiovascular:     Rate and Rhythm: Normal rate and regular rhythm.  Pulmonary:     Effort: Pulmonary effort is normal. No respiratory distress.     Breath sounds: Normal breath sounds. No wheezing or rales.  Abdominal:     General: Bowel sounds are normal. There is no distension.     Palpations: Abdomen is soft.     Tenderness: There is no abdominal tenderness. There is no rebound.  Musculoskeletal:        General: Swelling and tenderness present.     Cervical back: Normal range of motion.     Comments: No pain in the left foot, ankle with minimal tenderness.  Left calf and leg with swelling 2+ and heat, redness on the left calf and to the knee. With calf tenderness to palpation  Skin:    General: Skin is warm and dry.  Neurological:     Mental Status: He is alert and oriented to person, place, and time.     Coordination: Coordination abnormal.     Comments: Cane for stability     Vitals:   11/24/19 1430  BP: (!) 148/90  Pulse: 88  Temp: 99.4 F (37.4 C)  TempSrc: Oral  SpO2: 94%  Weight: (!) 430 lb (195 kg)  Height: 5\' 10"  (1.778 m)    This visit occurred during the SARS-CoV-2 public health emergency.  Safety protocols were in place, including screening questions prior to the visit, additional usage of staff PPE, and extensive cleaning of exam room while observing appropriate contact time as indicated for disinfecting solutions.   Assessment & Plan:

## 2019-11-27 ENCOUNTER — Telehealth: Payer: Self-pay | Admitting: Internal Medicine

## 2019-11-27 NOTE — Telephone Encounter (Signed)
New message:   Pt is calling in reference pulled muscle and states his left leg is very hot. Pt would like to know if he can take something for the fever in his leg and states he sees some blisters coming up too. He states the pharmacy told him he can't take tylenol. Please advise.

## 2019-11-28 NOTE — Telephone Encounter (Signed)
Please advise 

## 2019-11-28 NOTE — Telephone Encounter (Addendum)
Called and spoke with patient, discussed with patient that he can take two 500mg  tabs every 6 hours, not to exceed 3000mg  in 24 hours, do not take  with the hydrocodone.  Patient voiced understanding

## 2019-11-28 NOTE — Telephone Encounter (Signed)
He can take tylenol. No more than 3000 mg within 24 hours.

## 2019-12-02 ENCOUNTER — Telehealth: Payer: Self-pay

## 2019-12-02 NOTE — Telephone Encounter (Signed)
New message   Asking the CMA to call him back do not know the name of the antibiotic that was given to him.    C/o Yeast infection

## 2019-12-03 NOTE — Telephone Encounter (Signed)
Patient said that he is currently on an antibiotic he noticed a few days ago that he is bothered by yeast. He has had this issue before when on antibiotic before

## 2019-12-04 MED ORDER — FLUCONAZOLE 150 MG PO TABS
150.0000 mg | ORAL_TABLET | Freq: Once | ORAL | 0 refills | Status: DC
Start: 2019-12-04 — End: 2020-05-28

## 2019-12-04 NOTE — Telephone Encounter (Signed)
Patient informed. 

## 2019-12-04 NOTE — Telephone Encounter (Signed)
Sent in diflucan

## 2019-12-08 ENCOUNTER — Other Ambulatory Visit (HOSPITAL_COMMUNITY): Payer: BLUE CROSS/BLUE SHIELD

## 2019-12-09 ENCOUNTER — Other Ambulatory Visit (HOSPITAL_COMMUNITY)
Admission: RE | Admit: 2019-12-09 | Discharge: 2019-12-09 | Disposition: A | Discharge status: Home / Self Care | Payer: BLUE CROSS/BLUE SHIELD | Source: Ambulatory Visit | Attending: Acute Care | Admitting: Acute Care

## 2019-12-09 DIAGNOSIS — Z01812 Encounter for preprocedural laboratory examination: Secondary | ICD-10-CM | POA: Insufficient documentation

## 2019-12-09 DIAGNOSIS — Z20822 Contact with and (suspected) exposure to covid-19: Secondary | ICD-10-CM | POA: Insufficient documentation

## 2019-12-09 LAB — SARS CORONAVIRUS 2 (TAT 6-24 HRS): SARS Coronavirus 2: NEGATIVE

## 2019-12-10 ENCOUNTER — Other Ambulatory Visit: Payer: Self-pay

## 2019-12-10 ENCOUNTER — Ambulatory Visit (HOSPITAL_COMMUNITY)
Admission: RE | Admit: 2019-12-10 | Discharge: 2019-12-10 | Disposition: A | Discharge status: Home / Self Care | Payer: BLUE CROSS/BLUE SHIELD | Source: Ambulatory Visit | Attending: Acute Care | Admitting: Acute Care

## 2019-12-10 DIAGNOSIS — G4733 Obstructive sleep apnea (adult) (pediatric): Secondary | ICD-10-CM

## 2019-12-10 DIAGNOSIS — E662 Morbid (severe) obesity with alveolar hypoventilation: Secondary | ICD-10-CM | POA: Diagnosis not present

## 2019-12-10 DIAGNOSIS — I5030 Unspecified diastolic (congestive) heart failure: Secondary | ICD-10-CM | POA: Diagnosis not present

## 2019-12-10 DIAGNOSIS — Z930 Tracheostomy status: Secondary | ICD-10-CM | POA: Diagnosis not present

## 2019-12-10 DIAGNOSIS — Z430 Encounter for attention to tracheostomy: Secondary | ICD-10-CM | POA: Diagnosis present

## 2019-12-10 NOTE — Progress Notes (Addendum)
Tracheostomy Procedure Note  Derrick [name] [id number] May 19, 1981  Pre Procedure Tracheostomy Information  Trach Brand: Portex Bivona Size: 6.0 Style: Uncuffed Secured by: Velcro   Procedure: Trach cleaning and Trach change    Post Procedure Tracheostomy Information  Trach Brand: Portex Bivona Size: 6.0 Style: Uncuffed Secured by: Velcro   Post Procedure Evaluation:  ETCO2 positive color change from yellow to purple : Yes.   Vital signs:VSS Patients current condition: stable Complications: No apparent complications Trach site exam: clean and dry Wound care done: 4 x 4 gauze drain gauze applied Patient did tolerate procedure well.  Trach style  Portex Bivona ref# [id number]  Education: none  Prescription needs: none    Additional needs: PMV changed during this visit and additional one to take home Pt given 3 additional red trach caps 8 week follow up appt.

## 2019-12-10 NOTE — Progress Notes (Addendum)
[facility]   Reason for visit:  Routine trach change  HPI:  39 year old male. Well known to me. I follow him for trach care in setting of OSA, OHS and HFpEF all in context of MO. Returns today for planned and routine trach change.  ROS  Review of Systems - History obtained from the patient General ROS: negative Psychological ROS: negative ENT ROS: negative Endocrine ROS: negative Respiratory ROS: no cough, shortness of breath, or wheezing Cardiovascular ROS: no chest pain or dyspnea on exertion Gastrointestinal ROS: no abdominal pain, change in bowel habits, or black or bloody stools Musculoskeletal ROS: negative Neurological ROS: no TIA or stroke symptoms  Vital signs:  Reviewed  Exam:  Physical Exam Constitutional:      General: He is not in acute distress.    Appearance: Normal appearance. He is not ill-appearing, toxic-appearing or diaphoretic.  HENT:     Head: Normocephalic.     Nose: Nose normal.     Mouth/Throat:     Mouth: Mucous membranes are moist.  Neck:     Comments: Trach stoma unremarkable with the exception that he did have sig "rush of air" following removal of PMV raising concern for air trapping  Cardiovascular:     Rate and Rhythm: Normal rate and regular rhythm.  Pulmonary:     Effort: Pulmonary effort is normal.     Breath sounds: Normal breath sounds.  Abdominal:     Palpations: Abdomen is soft. There is no mass.     Tenderness: There is no guarding or rebound.  Musculoskeletal:        General: Normal range of motion.     Cervical back: Normal range of motion.  Skin:    Capillary Refill: Capillary refill takes less than 2 seconds.  Neurological:     General: No focal deficit present.     Mental Status: He is alert.  Psychiatric:        Mood and Affect: Mood normal.     Trach change/procedure: size 6 uncuffed bivona portex trach changed site unremarkable       Impression/dx  Tracheostomy dependence   OSA OHS HFpEF [place] Discussion  [name] is doing well from trach stand-point. He says he has 7lbs to go before he can be cleared for gastric surgery for wt loss. I am please with his overall health.  Plan  ROV 5-6 weeks for trach change Encourage red capping valve trials during day  If not tolerating capping trials I think would benefit from ENT eval before committing to extubation    Visit time: 32 minutes.   [name] ACNP-BC [facility] Pulmonary/Critical Care

## 2019-12-15 ENCOUNTER — Other Ambulatory Visit: Payer: Self-pay | Admitting: Internal Medicine

## 2020-01-12 ENCOUNTER — Other Ambulatory Visit: Payer: Self-pay | Admitting: Internal Medicine

## 2020-02-09 ENCOUNTER — Encounter: Payer: Self-pay | Admitting: Internal Medicine

## 2020-02-09 ENCOUNTER — Ambulatory Visit (INDEPENDENT_AMBULATORY_CARE_PROVIDER_SITE_OTHER): Payer: BLUE CROSS/BLUE SHIELD | Admitting: Internal Medicine

## 2020-02-09 ENCOUNTER — Other Ambulatory Visit: Payer: Self-pay

## 2020-02-09 ENCOUNTER — Ambulatory Visit (INDEPENDENT_AMBULATORY_CARE_PROVIDER_SITE_OTHER): Payer: BLUE CROSS/BLUE SHIELD

## 2020-02-09 VITALS — BP 142/96 | HR 85 | Temp 98.9°F | Ht 70.0 in | Wt >= 6400 oz

## 2020-02-09 DIAGNOSIS — G894 Chronic pain syndrome: Secondary | ICD-10-CM

## 2020-02-09 DIAGNOSIS — R0789 Other chest pain: Secondary | ICD-10-CM

## 2020-02-09 DIAGNOSIS — I5032 Chronic diastolic (congestive) heart failure: Secondary | ICD-10-CM

## 2020-02-09 DIAGNOSIS — E118 Type 2 diabetes mellitus with unspecified complications: Secondary | ICD-10-CM | POA: Diagnosis not present

## 2020-02-09 LAB — TROPONIN I (HIGH SENSITIVITY): High Sens Troponin I: 5 ng/L (ref 2–17)

## 2020-02-09 MED ORDER — GABAPENTIN 300 MG PO CAPS: 900.0000 mg | ORAL_CAPSULE | Freq: Three times a day (TID) | ORAL | 6 refills | Status: DC

## 2020-02-09 MED ORDER — SITAGLIPTIN PHOSPHATE 100 MG PO TABS: 100.0000 mg | ORAL_TABLET | Freq: Every day | ORAL | 3 refills | Status: DC

## 2020-02-09 NOTE — Assessment & Plan Note (Signed)
EKG done in office without changes. Checking CXR and BNP and troponin given upcoming surgery. Does not sound typical for cardiac.

## 2020-02-09 NOTE — Progress Notes (Signed)
   Subjective:   Patient ID: Derrick [name], male    DOB: June 13, 1981, 39 y.o.   MRN: [id number]  HPI The patient is a 39 YO man coming in for concerns about chest pains/pressure. Started in the last several weeks to month maximum. Is under a lot of stress now. Scheduled to have sleeve gastrectomy in August 2021. Wanted to make sure with his heart failure history that he was not having any problems with this. Denies any triggers for the chest pressure. Does have chronic cough which is slightly worse lately. Weight is fluctuating some between 140-145. Swelling in legs is worse in the last several months than previous. Has been taking his gabapentin more than prescribed due to getting confused and needs new rx as he will be out soon early. Denies missing lasix or other medications.   PMH, [facility], social history reviewed and updated  Review of Systems  Constitutional: Negative.   HENT: Negative.   Eyes: Negative.   Respiratory: Positive for cough and chest tightness. Negative for shortness of breath.   Cardiovascular: Positive for chest pain and leg swelling. Negative for palpitations.  Gastrointestinal: Negative for abdominal distention, abdominal pain, constipation, diarrhea, nausea and vomiting.  Musculoskeletal: Negative.   Skin: Negative.   Neurological: Negative.   Psychiatric/Behavioral: Negative.     Objective:  Physical Exam Constitutional:      Appearance: He is well-developed. He is obese.  HENT:     Head: Normocephalic and atraumatic.  Cardiovascular:     Rate and Rhythm: Normal rate and regular rhythm.  Pulmonary:     Effort: Pulmonary effort is normal. No respiratory distress.     Breath sounds: Normal breath sounds. No wheezing or rales.     Comments: Exam limited by habitus Abdominal:     General: Bowel sounds are normal. There is no distension.     Palpations: Abdomen is soft.     Tenderness: There is no abdominal tenderness. There is no rebound.    Musculoskeletal:     Cervical back: Normal range of motion.     Right lower leg: Edema present.     Left lower leg: Edema present.  Skin:    General: Skin is warm and dry.  Neurological:     Mental Status: He is alert and oriented to person, place, and time.     Coordination: Coordination normal.     Vitals:   02/09/20 1332  BP: (!) 142/96  Pulse: 85  Temp: 98.9 F (37.2 C)  TempSrc: Oral  SpO2: 90%  Weight: (!) 441 lb (200 kg)  Height: 5\' 10"  (1.778 m)   EKG: Rate 85, axis normal, interval PR 230 improved from prior, sinus, no st or t wave changes, no significant changes since Oct 2020  This visit occurred during the SARS-CoV-2 public health emergency.  Safety protocols were in place, including screening questions prior to the visit, additional usage of staff PPE, and extensive cleaning of exam room while observing appropriate contact time as indicated for disinfecting solutions.   Assessment & Plan:

## 2020-02-09 NOTE — Assessment & Plan Note (Signed)
Is taking lasix stable dosing and weight relatively stable.

## 2020-02-09 NOTE — Patient Instructions (Signed)
We are going to check labs today and chest x-ray.

## 2020-02-09 NOTE — Assessment & Plan Note (Signed)
Will increase gabapentin to 900 mg TID so he can refill early but did remind him of his typical dosing 600 mg TID.

## 2020-02-09 NOTE — Assessment & Plan Note (Signed)
Checking Hga1c, last 7.7 in Feb 2021.

## 2020-02-10 LAB — CBC
HCT: 38.4 % — ABNORMAL LOW (ref 38.5–50.0)
Hemoglobin: 12.2 g/dL — ABNORMAL LOW (ref 13.2–17.1)
MCH: 28.5 pg (ref 27.0–33.0)
MCHC: 31.8 g/dL — ABNORMAL LOW (ref 32.0–36.0)
MCV: 89.7 fL (ref 80.0–100.0)
MPV: 11.2 fL (ref 7.5–12.5)
Platelets: 182 10*3/uL (ref 140–400)
RBC: 4.28 10*6/uL (ref 4.20–5.80)
RDW: 13.4 % (ref 11.0–15.0)
WBC: 8.7 10*3/uL (ref 3.8–10.8)

## 2020-02-10 LAB — COMPREHENSIVE METABOLIC PANEL
AG Ratio: 1.1 (calc) (ref 1.0–2.5)
ALT: 23 U/L (ref 9–46)
AST: 17 U/L (ref 10–40)
Albumin: 3.5 g/dL — ABNORMAL LOW (ref 3.6–5.1)
Alkaline phosphatase (APISO): 103 U/L (ref 36–130)
BUN: 18 mg/dL (ref 7–25)
CO2: 38 mmol/L — ABNORMAL HIGH (ref 20–32)
Calcium: 9 mg/dL (ref 8.6–10.3)
Chloride: 96 mmol/L — ABNORMAL LOW (ref 98–110)
Creat: 0.85 mg/dL (ref 0.60–1.35)
Globulin: 3.2 g/dL (calc) (ref 1.9–3.7)
Glucose, Bld: 194 mg/dL — ABNORMAL HIGH (ref 65–99)
Potassium: 3.6 mmol/L (ref 3.5–5.3)
Sodium: 140 mmol/L (ref 135–146)
Total Bilirubin: 0.6 mg/dL (ref 0.2–1.2)
Total Protein: 6.7 g/dL (ref 6.1–8.1)

## 2020-02-10 LAB — HEMOGLOBIN A1C
Hgb A1c MFr Bld: 8.7 % of total Hgb — ABNORMAL HIGH (ref <5.7)
Mean Plasma Glucose: 203 (calc)
eAG (mmol/L): 11.2 (calc)

## 2020-02-10 LAB — BRAIN NATRIURETIC PEPTIDE: Brain Natriuretic Peptide: 20 pg/mL (ref <100)

## 2020-03-24 ENCOUNTER — Other Ambulatory Visit: Payer: Self-pay

## 2020-03-24 ENCOUNTER — Ambulatory Visit (INDEPENDENT_AMBULATORY_CARE_PROVIDER_SITE_OTHER): Payer: BLUE CROSS/BLUE SHIELD | Admitting: Family

## 2020-03-24 ENCOUNTER — Other Ambulatory Visit: Payer: Self-pay | Admitting: Internal Medicine

## 2020-03-24 DIAGNOSIS — E118 Type 2 diabetes mellitus with unspecified complications: Secondary | ICD-10-CM | POA: Diagnosis not present

## 2020-03-24 NOTE — Progress Notes (Signed)
Derrick [name] is a 39 y.o. male with the following history as recorded in Epic[employer]:  Patient Active Problem List   Diagnosis Date Noted  . Atypical chest pain 02/09/2020  . Left leg swelling 11/24/2019  . Chronic respiratory failure with hypoxia ([place]) 06/17/2019  . Acute on chronic respiratory failure with hypoxia ([place]) 06/17/2019  . Chronic pain syndrome 06/17/2019  . Left leg pain 04/01/2019  . Vitamin D deficiency 11/12/2017  . Tinnitus 09/27/2017  . Numbness and tingling of hand 11/30/2016  . Tracheostomy dependence ([place])   . Routine general medical examination at a health care facility 09/21/2016  . Acne 09/21/2016  . ED (erectile dysfunction) 06/02/2016  . Tracheostomy status ([place])   . Other allergic rhinitis   . Diabetes mellitus type 2 with complications ([place]) 18/56/3149  . Left ankle pain 08/11/2015  . Dysphagia   . Pulmonary hypertension ([place])   . Morbid obesity ([place]) 06/13/2015  . Essential hypertension 06/13/2015  . OSA (obstructive sleep apnea) 06/13/2015  . Obesity hypoventilation syndrome ([place]) 06/13/2015  . Chronic diastolic heart failure ([place]) 06/13/2015    Current Outpatient Medications  Medication Sig Dispense Refill  . acetaminophen (TYLENOL) 325 MG tablet Take 2 tablets (650 mg total) by mouth every 6 (six) hours as needed for mild pain or headache (fever >/= 101).    Marland Kitchen albuterol (PROVENTIL HFA;VENTOLIN HFA) 108 (90 Base) MCG/ACT inhaler Inhale 2 puffs into the lungs every 6 (six) hours as needed for wheezing or shortness of breath. 1 Inhaler 0  . allopurinol (ZYLOPRIM) 300 MG tablet Take 1 tablet by mouth once daily 90 tablet 1  . Blood Glucose Monitoring Suppl (BAYER CONTOUR NEXT MONITOR) w/Device KIT Use to check blood sugars daily Dx E11.9 1 kit 0  . clindamycin (CLINDAGEL) 1 % gel Apply 1 application topically daily.    . fluticasone (FLONASE) 50 MCG/ACT nasal spray Place 2 sprays into both nostrils daily. (Patient taking differently: Place 2  sprays into both nostrils daily as needed for allergies. ) 16 g 6  . fluticasone (FLOVENT HFA) 110 MCG/ACT inhaler Inhale 2 puffs into the lungs 2 (two) times daily. (Patient taking differently: Inhale 2 puffs into the lungs 2 (two) times daily as needed (shortness of breath). ) 1 Inhaler 2  . gabapentin (NEURONTIN) 300 MG capsule Take 3 capsules (900 mg total) by mouth 3 (three) times daily. 270 capsule 6  . glucose blood test strip Use as instructed 100 each 12  . guaiFENesin (MUCINEX) 600 MG 12 hr tablet Take 1 tablet by mouth daily.    Marland Kitchen HYDROcodone-acetaminophen (NORCO/VICODIN) 5-325 MG tablet Take 1 tablet by mouth every 8 (eight) hours as needed for moderate pain. 15 tablet 0  . levocetirizine (XYZAL) 5 MG tablet TAKE 1 TABLET BY MOUTH ONCE DAILY IN THE EVENING 30 tablet 6  . metoCLOPramide (REGLAN) 5 MG/5ML solution Take 10 mg by mouth 4 (four) times daily.    . montelukast (SINGULAIR) 10 MG tablet Take 1 tablet (10 mg total) by mouth at bedtime. 90 tablet 3  . omeprazole (PRILOSEC) 40 MG capsule Take 40 mg by mouth daily.    . ondansetron (ZOFRAN) 4 MG tablet Take 1 tablet (4 mg total) by mouth every 8 (eight) hours as needed for nausea or vomiting. 20 tablet 0  . ONETOUCH DELICA LANCETS 70Y MISC Use as needed daily 100 each 11  . potassium chloride (KLOR-CON) 20 MEQ packet Take 20 mEq by mouth daily. 100 packet 11  . tadalafil (CIALIS)  20 MG tablet Take 0.5-1 tablets (10-20 mg total) by mouth every other day as needed for erectile dysfunction. 5 tablet 11  . Tracheostomy Care KIT 30 kits by Does not apply route daily. 30 each 6  . ursodiol (ACTIGALL) 300 MG capsule Take 300 mg by mouth 2 (two) times daily.    . furosemide (LASIX) 80 MG tablet Take 2 tablets (160 mg total) by mouth 3 (three) times daily as needed. (Patient not taking: Reported on 03/24/2020) 180 tablet 11  . metoprolol tartrate (LOPRESSOR) 50 MG tablet Take 1 tablet by mouth twice daily 180 tablet 0  . pioglitazone (ACTOS)  30 MG tablet Take 1 tablet by mouth once daily (Patient not taking: Reported on 03/24/2020) 90 tablet 0  . sitaGLIPtin (JANUVIA) 100 MG tablet Take 1 tablet (100 mg total) by mouth daily. (Patient not taking: Reported on 03/24/2020) 90 tablet 3   No current facility-administered medications for this visit.    Allergies: Shrimp [shellfish allergy], Vancomycin, and Zosyn [piperacillin sod-tazobactam so]  Past Medical History:  Diagnosis Date  . Acute on chronic diastolic CHF (congestive heart failure), NYHA class 1 ([place])   . Diabetes mellitus without complication ([place])   . GERD (gastroesophageal reflux disease)   . Hypertension   . Hypoxemia   . Morbid obesity ([place])   . Obesity hypoventilation syndrome ([place])   . OSA (obstructive sleep apnea)   . Reflux     Past Surgical History:  Procedure Laterality Date  . TRACHEOSTOMY TUBE PLACEMENT N/A 06/21/2015   Procedure: TRACHEOSTOMY;  Surgeon: Leta Baptist, MD;  Location: [facility] OR;  Service: ENT;  Laterality: N/A;    Family History  Problem Relation Age of Onset  . Diabetes Maternal Grandmother   . Hypertension Maternal Grandmother   . Asthma Other   . Cancer Other   . Stroke Other   . Heart disease Other     Social History   Tobacco Use  . Smoking status: Never Smoker  . Smokeless tobacco: Never Used  Substance Use Topics  . Alcohol use: No    Subjective:  Patient had a laparoscopic sleeve gastrectomy on 03/08/2020; his surgeon is through [facility]; he is doing very well and has no concerns today; has lost 40 pounds since he was last seen at our office in July 2021; he notes he was told by a nurse when he was discharged from the hospital that he needed to follow up with his PCP after his surgery; unfortunately no documentation available to review today but review of documentation from [facility] indicated need for surgical follow-up in 7-10 days; patient has had post-op check with his surgeon already and is scheduled for another appointment  on 04/09/2020 in person with labs planned ;    Objective:  Vitals:   03/24/20 1011  BP: 136/82  Pulse: 91  Temp: 98.6 F (37 C)  SpO2: 96%  Weight: (!) 403 lb (182.8 kg)  Height: $Remove'5\' 10"'[id number]$  (1.778 m)    General: Well developed, well nourished, in no acute distress  Skin : Warm and dry.  Head: Normocephalic and atraumatic  Lungs: Respirations unlabored; clear to auscultation bilaterally without wheeze, rales, rhonchi  CVS exam: normal rate and regular rhythm.  Neurologic: Alert and oriented; speech intact; face symmetrical; moves all extremities well; CNII-XII intact without focal deficit   Assessment:  1. Morbid obesity ([place])   2. Type 2 diabetes mellitus with complication, without long-term current use of insulin ([place])     Plan:  Appears to be doing very well with surgery/ committed to his weight loss goals; congratulated him on his commitment; continue with surgeon as recommended and scheduled; he defers having labs today as surgeon will be doing at upcoming [place]; Not currently on any labs for Type 2 diabetes- reassurance that typically patients can stop their diabetes medications after weight loss surgery; Did encourage patient to consider getting his COVID vaccine as soon as his surgeon agreed;  Time spent 30 minutes  This visit occurred during the SARS-CoV-2 public health emergency.  Safety protocols were in place, including screening questions prior to the visit, additional usage of staff PPE, and extensive cleaning of exam room while observing appropriate contact time as indicated for disinfecting solutions.     No follow-ups on file.  No orders of the defined types were placed in this encounter.   Requested Prescriptions    No prescriptions requested or ordered in this encounter

## 2020-03-29 ENCOUNTER — Other Ambulatory Visit (HOSPITAL_COMMUNITY)
Admission: RE | Admit: 2020-03-29 | Discharge: 2020-03-29 | Disposition: A | Discharge status: Home / Self Care | Payer: BLUE CROSS/BLUE SHIELD | Source: Ambulatory Visit | Attending: Acute Care | Admitting: Acute Care

## 2020-03-29 DIAGNOSIS — Z01812 Encounter for preprocedural laboratory examination: Secondary | ICD-10-CM | POA: Insufficient documentation

## 2020-03-29 DIAGNOSIS — Z20822 Contact with and (suspected) exposure to covid-19: Secondary | ICD-10-CM | POA: Insufficient documentation

## 2020-03-29 LAB — SARS CORONAVIRUS 2 (TAT 6-24 HRS): SARS Coronavirus 2: NEGATIVE

## 2020-03-31 ENCOUNTER — Ambulatory Visit (HOSPITAL_COMMUNITY)
Admission: RE | Admit: 2020-03-31 | Discharge: 2020-03-31 | Disposition: A | Discharge status: Home / Self Care | Payer: BLUE CROSS/BLUE SHIELD | Source: Ambulatory Visit | Attending: Acute Care | Admitting: Acute Care

## 2020-03-31 ENCOUNTER — Other Ambulatory Visit: Payer: Self-pay

## 2020-03-31 DIAGNOSIS — E662 Morbid (severe) obesity with alveolar hypoventilation: Secondary | ICD-10-CM | POA: Diagnosis not present

## 2020-03-31 DIAGNOSIS — Z9884 Bariatric surgery status: Secondary | ICD-10-CM | POA: Insufficient documentation

## 2020-03-31 DIAGNOSIS — Z430 Encounter for attention to tracheostomy: Secondary | ICD-10-CM | POA: Insufficient documentation

## 2020-03-31 DIAGNOSIS — Z930 Tracheostomy status: Secondary | ICD-10-CM

## 2020-03-31 NOTE — Progress Notes (Signed)
Tracheostomy Procedure Note  BURDELL PEED [id number] 20-Nov-1980  Pre Procedure Tracheostomy Information  Trach Brand: Portex  Bivona  [id number] Size: 6.0 Style: Uncuffed Secured by: Velcro   Procedure: Trach change and trach cleaning    Post Procedure Tracheostomy Information  Trach Brand: Bivona Portex  Size: 6.0 Style: Uncuffed Secured by: Velcro   Post Procedure Evaluation:  ETCO2 positive color change from yellow to purple : Yes.   Vital signs: VSS Patients current condition: stable Complications: No apparent complications Trach site exam: clean and clean Wound care done: 4 x 4 gauze  Drain style Patient did tolerate procedure well.   Education: none  Prescription needs: none    Additional needs: none

## 2020-03-31 NOTE — Progress Notes (Signed)
tracheostomy clinic note   Chief complaint/reason for visit: Routine tracheostomy change  Brief history: 39 year old male patient well-known to me I follow him for tracheostomy management in the setting of severe obstructive sleep apnea and obesity hypoventilation syndrome.   Interval history: Derrick [name] is doing well from a pulmonary and tracheostomy standpoint.  He just recently had a gastric sleeve completed, he is about 2 or 3 weeks postoperative currently, he is already lost about 40 pounds, he is in fantastic spirits, very motivated, has already noticed increased activity tolerance and significant decrease in his exertional dyspnea.  Presents today for planned tracheostomy change and planning for future  Review of systems: General no fever chills weakness.  HEENT no tracheostomy drainage.  No headache, no nasal discharge, no sore throat, no sinus issues.  Pulmonary: No shortness of breath, chest pain, cough, wheezing.  In fact is had improvement in all pulmonary symptoms.  Cardiac: No chest pain, palpitations, no edema even in spite of coming off from diuretics.  GI.  No nausea vomiting tolerating liquid diet as directed by gastric bypass physician.  Pain is tolerable.  Actually minimal.  GU: No issues neuro: No issues endocrine: No issues  Physical exam: Currently pulse oximetry high 90s on room air  General this is a very pleasant 39 year old black male he is ambulatory, and was able to walk into the clinic without exertional dyspnea HEENT normocephalic atraumatic.  Has a size 6 Bivona Portex tracheostomy in place the stoma is well cared for, his phonation quality is excellent with tracheal occlusion and Passy-Muir valve Pulmonary: Clear to auscultation Cardiac: Regular rate and rhythm Abdomen nontender Extremities warm dry no edema Neuro intact  Tracheostomy change: The existing tracheostomy was removed, tracheostomy site inspected, this was unremarkable.  A new size 6 uncuffed  Bivona Portex trach was placed without difficulty.  End-tidal CO2 was checked and verified patient tolerated well  Impression/plan Obesity hypoventilation syndrome Obstructive sleep apnea Tracheostomy dependence Status post recent gastric sleeve  Discussion Derrick [name] is doing fantastic from a tracheostomy standpoint.  He tolerates Passy-Muir valve as well as tracheal occlusion well.  He is already had significant weight loss following his surgery, however at this point he has not initiated CPAP trials.  I suspect given his large volume of weight loss so rapidly and the fact that we anticipate it will continue his current level of CPAP support may be too much.  For now we will continue routine tracheostomy care, but I think he needs to be seen by sleep medicine in the next couple months so that we can assess his CPAP requirements.  I think this will be important to encourage compliance  Plan I will send a message to our pulmonary clinic, our goal is to have him be seen by Dr. Craige Cotta hopefully in December I will plan on seeing him again in December for routine tracheostomy change, at that point hopefully we will be able to initiate planning for capping trials with ultimate goal to eventually proceed to decannulation  Visit time 40 minutes [name] ACNP-BC [facility] Pulmonary/Critical Care Pager # [phone] OR # [phone] if no answer

## 2020-04-22 ENCOUNTER — Other Ambulatory Visit: Payer: Self-pay | Admitting: Internal Medicine

## 2020-05-11 ENCOUNTER — Encounter (HOSPITAL_BASED_OUTPATIENT_CLINIC_OR_DEPARTMENT_OTHER): Payer: Self-pay | Admitting: *Deleted

## 2020-05-11 ENCOUNTER — Emergency Department (HOSPITAL_BASED_OUTPATIENT_CLINIC_OR_DEPARTMENT_OTHER)
Admission: EM | Admit: 2020-05-11 | Discharge: 2020-05-11 | Disposition: A | Discharge status: Home / Self Care | Payer: BLUE CROSS/BLUE SHIELD | Attending: Emergency Medicine | Admitting: Emergency Medicine

## 2020-05-11 ENCOUNTER — Other Ambulatory Visit: Payer: Self-pay

## 2020-05-11 DIAGNOSIS — Z79899 Other long term (current) drug therapy: Secondary | ICD-10-CM | POA: Insufficient documentation

## 2020-05-11 DIAGNOSIS — I5033 Acute on chronic diastolic (congestive) heart failure: Secondary | ICD-10-CM | POA: Insufficient documentation

## 2020-05-11 DIAGNOSIS — E119 Type 2 diabetes mellitus without complications: Secondary | ICD-10-CM | POA: Insufficient documentation

## 2020-05-11 DIAGNOSIS — I110 Hypertensive heart disease with heart failure: Secondary | ICD-10-CM | POA: Diagnosis not present

## 2020-05-11 DIAGNOSIS — M79605 Pain in left leg: Secondary | ICD-10-CM | POA: Diagnosis present

## 2020-05-11 DIAGNOSIS — L03116 Cellulitis of left lower limb: Secondary | ICD-10-CM | POA: Diagnosis not present

## 2020-05-11 MED ORDER — CEPHALEXIN 250 MG PO CAPS
500.0000 mg | ORAL_CAPSULE | Freq: Once | ORAL | Status: AC
  Administered 2020-05-11: 500 mg via ORAL
  Filled 2020-05-11: qty 2

## 2020-05-11 MED ORDER — CEPHALEXIN 500 MG PO CAPS: 500.0000 mg | ORAL_CAPSULE | Freq: Three times a day (TID) | ORAL | 0 refills | Status: AC

## 2020-05-11 NOTE — ED Triage Notes (Signed)
Left calf pain with redness and warmth x 2 days. Ambulatory, denies injury. Denies long travel, denies SOB.

## 2020-05-11 NOTE — Discharge Instructions (Addendum)
Please go to your primary care provider tomorrow morning for further evaluation and management.  Your physical exam is suggestive of cellulitis. However, I cannot exclude DVT unless ultrasound was obtained. Given that you have had multiple DVT studies in the past that have been negative and you deny any new or unusual swelling, I have lower suspicion for DVT. However, please discuss this further with your primary care provider. We do not have ultrasound available here in the ED tonight. You may need to return if they feel as though it is warranted.  Please take the Keflex antibiotics, as directed. Continue with Tylenol as needed for pain symptoms.  Return to the ED or seek immediate medical attention should you experience any new or worsening symptoms.

## 2020-05-11 NOTE — ED Provider Notes (Signed)
[facility] EMERGENCY DEPARTMENT Provider Note   CSN: [id number] Arrival date & time: 05/11/20  1755     History Chief Complaint  Patient presents with  . Leg Pain    Derrick [name] is a 39 y.o. male with PMH significant for HTN, morbid obesity, gastric sleeve surgery 02/2020, and type II DM no longer on medication who presents to the ED with a 2-day history of redness and warmth in his left calf.  Patient reports that approximately 48 hours ago he noticed redness and warmth involving his left lower leg.  He states that his left lower leg is chronically swollen relative to his contralateral leg.  He denies any new or different swelling.  No history of clots or clotting disorder.  He states that he has been treated for recurrent cellulitis in the left leg before, with good effect.  He states that he has tenderness to palpation over his left calf and feels as though it is "very hot".  He denies any pain with ambulation.  He also denies any numbness, weakness, obvious precipitating trauma or injury, fevers or chills, CP or SOB, recent travel, or other symptoms of systemic illness.  He took Tylenol earlier today which he states improved his symptoms.  HPI     Past Medical History:  Diagnosis Date  . Acute on chronic diastolic CHF (congestive heart failure), NYHA class 1 ([place])   . Diabetes mellitus without complication ([place])   . GERD (gastroesophageal reflux disease)   . Hypertension   . Hypoxemia   . Morbid obesity ([place])   . Obesity hypoventilation syndrome ([place])   . OSA (obstructive sleep apnea)   . Reflux     Patient Active Problem List   Diagnosis Date Noted  . Atypical chest pain 02/09/2020  . Left leg swelling 11/24/2019  . Chronic respiratory failure with hypoxia ([place]) 06/17/2019  . Acute on chronic respiratory failure with hypoxia ([place]) 06/17/2019  . Chronic pain syndrome 06/17/2019  . Left leg pain 04/01/2019  . Vitamin D deficiency 11/12/2017  . Tinnitus  09/27/2017  . Numbness and tingling of hand 11/30/2016  . Tracheostomy dependence ([place])   . Routine general medical examination at a health care facility 09/21/2016  . Acne 09/21/2016  . ED (erectile dysfunction) 06/02/2016  . Tracheostomy status ([place])   . Other allergic rhinitis   . Diabetes mellitus type 2 with complications ([place]) 63/89/3734  . Left ankle pain 08/11/2015  . Dysphagia   . Pulmonary hypertension ([place])   . Morbid obesity ([place]) 06/13/2015  . Essential hypertension 06/13/2015  . OSA (obstructive sleep apnea) 06/13/2015  . Obesity hypoventilation syndrome ([place]) 06/13/2015  . Chronic diastolic heart failure ([place]) 06/13/2015    Past Surgical History:  Procedure Laterality Date  . TRACHEOSTOMY TUBE PLACEMENT N/A 06/21/2015   Procedure: TRACHEOSTOMY;  Surgeon: Leta Baptist, MD;  Location: [facility] OR;  Service: ENT;  Laterality: N/A;       Family History  Problem Relation Age of Onset  . Diabetes Maternal Grandmother   . Hypertension Maternal Grandmother   . Asthma Other   . Cancer Other   . Stroke Other   . Heart disease Other     Social History   Tobacco Use  . Smoking status: Never Smoker  . Smokeless tobacco: Never Used  Substance Use Topics  . Alcohol use: No  . Drug use: No    Home Medications Prior to Admission medications   Medication Sig Start Date End Date Taking? Authorizing  Provider  acetaminophen (TYLENOL) 325 MG tablet Take 2 tablets (650 mg total) by mouth every 6 (six) hours as needed for mild pain or headache (fever >/= 101). 06/21/19   Lonia Blood, MD  albuterol (PROVENTIL HFA;VENTOLIN HFA) 108 (90 Base) MCG/ACT inhaler Inhale 2 puffs into the lungs every 6 (six) hours as needed for wheezing or shortness of breath. 07/30/18   Myrlene Broker, MD  allopurinol (ZYLOPRIM) 300 MG tablet Take 1 tablet by mouth once daily 12/15/19   Myrlene Broker, MD  Blood Glucose Monitoring Suppl (BAYER CONTOUR NEXT MONITOR) w/Device KIT Use to  check blood sugars daily Dx E11.9 10/16/16   Myrlene Broker, MD  cephALEXin (KEFLEX) 500 MG capsule Take 1 capsule (500 mg total) by mouth 3 (three) times daily for 7 days. 05/11/20 05/18/20  Lorelee New, PA-C  clindamycin (CLINDAGEL) 1 % gel Apply 1 application topically daily. 06/10/19   [provider]  fluticasone (FLONASE) 50 MCG/ACT nasal spray Place 2 sprays into both nostrils daily. Patient taking differently: Place 2 sprays into both nostrils daily as needed for allergies.  06/11/19   Olive Bass, FNP  fluticasone (FLOVENT HFA) 110 MCG/ACT inhaler Inhale 2 puffs into the lungs 2 (two) times daily. Patient taking differently: Inhale 2 puffs into the lungs 2 (two) times daily as needed (shortness of breath).  07/30/18   Myrlene Broker, MD  furosemide (LASIX) 80 MG tablet Take 2 tablets (160 mg total) by mouth 3 (three) times daily as needed. Patient not taking: Reported on 03/24/2020 11/13/19   Myrlene Broker, MD  gabapentin (NEURONTIN) 300 MG capsule Take 3 capsules (900 mg total) by mouth 3 (three) times daily. 02/09/20   Myrlene Broker, MD  glucose blood test strip Use as instructed 11/30/16   Myrlene Broker, MD  guaiFENesin (MUCINEX) 600 MG 12 hr tablet Take 1 tablet by mouth daily.    [provider]  HYDROcodone-acetaminophen (NORCO/VICODIN) 5-325 MG tablet Take 1 tablet by mouth every 8 (eight) hours as needed for moderate pain. 11/24/19   Myrlene Broker, MD  levocetirizine (XYZAL) 5 MG tablet TAKE 1 TABLET BY MOUTH ONCE DAILY IN THE EVENING 01/14/19   Myrlene Broker, MD  metoCLOPramide (REGLAN) 5 MG/5ML solution Take 10 mg by mouth 4 (four) times daily. 03/10/20   [provider]  metoprolol tartrate (LOPRESSOR) 50 MG tablet Take 1 tablet by mouth twice daily 03/24/20   Myrlene Broker, MD  montelukast (SINGULAIR) 10 MG tablet Take 1 tablet (10 mg total) by mouth at bedtime. 11/13/19   Myrlene Broker, MD  omeprazole (PRILOSEC) 40 MG capsule Take 40 mg by mouth daily. 03/08/20   [provider]  ondansetron (ZOFRAN) 4 MG tablet Take 1 tablet (4 mg total) by mouth every 8 (eight) hours as needed for nausea or vomiting. 06/24/19   Myrlene Broker, MD  [facility] DELICA LANCETS 33G MISC Use as needed daily 11/30/16   Myrlene Broker, MD  pioglitazone (ACTOS) 30 MG tablet Take 1 tablet by mouth once daily Patient not taking: Reported on 03/24/2020 09/19/19   Myrlene Broker, MD  potassium chloride (KLOR-CON) 20 MEQ packet Take 20 mEq by mouth daily. 11/13/19   Myrlene Broker, MD  sildenafil (VIAGRA) 100 MG tablet TAKE 1/2 TO 1 (ONE-HALF TO ONE) TABLET BY MOUTH ONCE DAILY AS NEEDED FOR ERECTILE DYSFUNCTION 04/22/20   Myrlene Broker, MD  sitaGLIPtin (JANUVIA) 100 MG tablet Take  1 tablet (100 mg total) by mouth daily. Patient not taking: Reported on 03/24/2020 02/09/20   Hoyt Koch, MD  tadalafil (CIALIS) 20 MG tablet Take 0.5-1 tablets (10-20 mg total) by mouth every other day as needed for erectile dysfunction. 11/13/19   Hoyt Koch, MD  Tracheostomy Care KIT 30 kits by Does not apply route daily. 09/27/15   Erick Colace, NP  ursodiol (ACTIGALL) 300 MG capsule Take 300 mg by mouth 2 (two) times daily. 03/19/20   [provider]    Allergies    Shrimp [shellfish allergy], Vancomycin, and Zosyn [piperacillin sod-tazobactam so]  Review of Systems   Review of Systems  All other systems reviewed and are negative.   Physical Exam Updated Vital Signs BP 127/81 (BP Location: Right Arm)   Pulse 80   Temp 98.6 F (37 C) (Oral)   Resp 18   Ht _0  (1.753 m)   Wt (!) 173 kg   SpO2 96%   BMI 56.32 kg/m   Physical Exam Vitals and nursing note reviewed. Exam conducted with a chaperone present.  Constitutional:      Appearance: Normal appearance. He is obese.  HENT:     Head: Normocephalic and atraumatic.  Eyes:      General: No scleral icterus.    Conjunctiva/sclera: Conjunctivae normal.  Cardiovascular:     Rate and Rhythm: Normal rate and regular rhythm.     Pulses: Normal pulses.     Heart sounds: Normal heart sounds.  Pulmonary:     Effort: Pulmonary effort is normal. No respiratory distress.     Breath sounds: Normal breath sounds. No wheezing or rales.  Musculoskeletal:     Cervical back: Normal range of motion. No rigidity.     Comments: Left calf: Induration over area of left calf.  Calf is very warm to the touch, TTP.  Erythema noted.  Negative Homans' sign.  Swollen relative contralateral leg (chronic).  Pedal pulses intact and symmetric.  Sensation in feet mildly diminished bilaterally. ROM and strength fully intact.    Skin:    General: Skin is dry.     Capillary Refill: Capillary refill takes less than 2 seconds.  Neurological:     Mental Status: He is alert and oriented to person, place, and time.     GCS: GCS eye subscore is 4. GCS verbal subscore is 5. GCS motor subscore is 6.  Psychiatric:        Mood and Affect: Mood normal.        Behavior: Behavior normal.        Thought Content: Thought content normal.     ED Results / Procedures / Treatments   Labs (all labs ordered are listed, but only abnormal results are displayed) Labs Reviewed - No data to display  EKG None  Radiology No results found.  Procedures Procedures (including critical care time)  Medications Ordered in ED Medications  cephALEXin (KEFLEX) capsule 500 mg (500 mg Oral Given 05/11/20 1922)    ED Course  I have reviewed the triage vital signs and the nursing notes.  Pertinent labs & imaging results that were available during my care of the patient were reviewed by me and considered in my medical decision making (see chart for details).    MDM Rules/Calculators/A&P                          Patient's history and physical exam is more suggestive  of cellulitis.  He denies any history of clots or  clotting disorder.  He did have a gastric bypass procedure in August, but denies any immobilization.  He states that his left lower leg is chronically swollen.  I reviewed his medical record and he has had multiple negative left-sided DVT studies obtained in the past.    Patient states that he called the office of his primary care provider and they would be able to see him tomorrow.  However, given the redness and warmth, want to be evaluated here in the ER this evening.  He states that he has been given Keflex in the past, with good effect.  We do not have ultrasound here at the moment, but offered transfer for ultrasound evaluation at separate ED and he declined.  He feels as though there has been no swelling.  He also adamantly denies any chest pain or shortness of breath.  I feel as though it is reasonable to treat with antibiotics and follow-up with his primary care provider tomorrow.  At that point, they can choose whether or not they feel as though repeat DVT ultrasound study is warranted.  Laboratory work-up is warranted given brevity of illness and normal vital signs.  He states that he had been told by his primary care provider/neurosurgeon that he no longer needs to take his diabetic medication.    Strict ED return precautions discussed.  Patient voices understanding and is agreeable to the plan.    Final Clinical Impression(s) / ED Diagnoses Final diagnoses:  Cellulitis of left lower extremity    Rx / DC Orders ED Discharge Orders         Ordered    cephALEXin (KEFLEX) 500 MG capsule  3 times daily        05/11/20 2002           Corena Herter, PA-C 05/11/20 2002    [place], [place], DO 05/11/20 2336

## 2020-05-27 ENCOUNTER — Telehealth: Payer: Self-pay | Admitting: Internal Medicine

## 2020-05-27 NOTE — Telephone Encounter (Signed)
Patient was seen in the hospital for cellulitis and they gave himCephalexin and he states it has given him a yeast infection and it is the same thing that happens to him when they give him doxycycline for his cellulitis and he always gets a yeast infection and he said when this happens Dr. Okey Dupre gives him some kind of pill and he was wondering if that would be possible.  Patient # [phone]  [facility] [address] [place], [place] - [address] Phone:  [phone]  Fax:  [phone]

## 2020-05-28 ENCOUNTER — Other Ambulatory Visit: Payer: Self-pay | Admitting: Family

## 2020-05-28 MED ORDER — FLUCONAZOLE 150 MG PO TABS: 150.0000 mg | ORAL_TABLET | Freq: Once | ORAL | 0 refills | Status: AC

## 2020-05-28 NOTE — Telephone Encounter (Signed)
Called pt, LVM.   

## 2020-05-28 NOTE — Telephone Encounter (Signed)
I sent in a Diflucan for him; however, he was supposed to follow up here 1 day after ER evaluation to have the cellulitis re-checked; he should schedule that re-check.

## 2020-06-09 ENCOUNTER — Telehealth (INDEPENDENT_AMBULATORY_CARE_PROVIDER_SITE_OTHER): Payer: BLUE CROSS/BLUE SHIELD | Admitting: Family Medicine

## 2020-06-09 DIAGNOSIS — J069 Acute upper respiratory infection, unspecified: Secondary | ICD-10-CM

## 2020-06-09 DIAGNOSIS — Z7189 Other specified counseling: Secondary | ICD-10-CM

## 2020-06-09 MED ORDER — BENZONATATE 100 MG PO CAPS: 100.0000 mg | ORAL_CAPSULE | Freq: Two times a day (BID) | ORAL | 0 refills | Status: DC | PRN

## 2020-06-09 MED ORDER — FLUTICASONE PROPIONATE 50 MCG/ACT NA SUSP: 1.0000 | Freq: Every day | NASAL | 0 refills | Status: DC

## 2020-06-09 NOTE — Progress Notes (Signed)
Virtual Visit via Video Note  I connected with Derrick [name] on 06/09/20 at  3:00 PM EST by a video enabled telemedicine application 2/2 [id number] pandemic and verified that I am speaking with the correct person using two identifiers.  Location patient: home Location provider:work or home office Persons participating in the virtual visit: patient, provider  I discussed the limitations of evaluation and management by telemedicine and the availability of in person appointments. The patient expressed understanding and agreed to proceed.   HPI: Pt is a 39 yo male with pmh sig for pulmonary hypertension, HTN, chronic diastolic CHF, OSA, obesity hypoventilation syndrome, chronic respiratory failure with hypoxia, tracheostomy dependent, GERD, DM 2 followed by Dr. Sharlet [name] who was seen for acute concern.    Pt endorses nasal congestion x 2-3 days. This morning pt had decreased sense of smell.  Denies fever, HAs, chills, facial pain/pressure, ear pain/pressure.  Pt has mild throat irritation but not really a soreness.  Mostly from the increased coughing.  Tried Mucinex sinus max.  Pt denies sick contacts.  Pt had one dose of Pfizer COVID vaccine in Sept.  Was hesitant to get a 2nd dose 2/2 concerns from family members in healthcare.  Has since reconsidered.    Pt s/p gastric bypass 2 months ago.  Now eating liquids and solids.  Has loose stools.  Pt lost 75 lbs since surgery.  Pt also has a trach.  Notes "heavy congestion" when the seasons change.    ROS: See pertinent positives and negatives per HPI.  Past Medical History:  Diagnosis Date  . Acute on chronic diastolic CHF (congestive heart failure), NYHA class 1 (Derrick [name])   . Diabetes mellitus without complication ([name])   . GERD (gastroesophageal reflux disease)   . Hypertension   . Hypoxemia   . Morbid obesity ([name])   . Obesity hypoventilation syndrome ([name])   . OSA (obstructive sleep apnea)   . Reflux     Past Surgical History:  Procedure  Laterality Date  . TRACHEOSTOMY TUBE PLACEMENT N/A 06/21/2015   Procedure: TRACHEOSTOMY;  Surgeon: Leta Baptist, MD;  Location: [facility] OR;  Service: ENT;  Laterality: N/A;    Family History  Problem Relation Age of Onset  . Diabetes Maternal Grandmother   . Hypertension Maternal Grandmother   . Asthma Other   . Cancer Other   . Stroke Other   . Heart disease Other      Current Outpatient Medications:  .  acetaminophen (TYLENOL) 325 MG tablet, Take 2 tablets (650 mg total) by mouth every 6 (six) hours as needed for mild pain or headache (fever >/= 101)., Disp:  , Rfl:  .  albuterol (PROVENTIL HFA;VENTOLIN HFA) 108 (90 Base) MCG/ACT inhaler, Inhale 2 puffs into the lungs every 6 (six) hours as needed for wheezing or shortness of breath., Disp: 1 Inhaler, Rfl: 0 .  allopurinol (ZYLOPRIM) 300 MG tablet, Take 1 tablet by mouth once daily, Disp: 90 tablet, Rfl: 1 .  Blood Glucose Monitoring Suppl (BAYER CONTOUR NEXT MONITOR) w/Device KIT, Use to check blood sugars daily Dx E11.9, Disp: 1 kit, Rfl: 0 .  clindamycin (CLINDAGEL) 1 % gel, Apply 1 application topically daily., Disp: , Rfl:  .  fluticasone (FLONASE) 50 MCG/ACT nasal spray, Place 2 sprays into both nostrils daily. (Patient taking differently: Place 2 sprays into both nostrils daily as needed for allergies. ), Disp: 16 g, Rfl: 6 .  fluticasone (FLOVENT HFA) 110 MCG/ACT inhaler, Inhale 2 puffs into the lungs 2 (two) times  daily. (Patient taking differently: Inhale 2 puffs into the lungs 2 (two) times daily as needed (shortness of breath). ), Disp: 1 Inhaler, Rfl: 2 .  furosemide (LASIX) 80 MG tablet, Take 2 tablets (160 mg total) by mouth 3 (three) times daily as needed. (Patient not taking: Reported on 03/24/2020), Disp: 180 tablet, Rfl: 11 .  gabapentin (NEURONTIN) 300 MG capsule, Take 3 capsules (900 mg total) by mouth 3 (three) times daily., Disp: 270 capsule, Rfl: 6 .  glucose blood test strip, Use as instructed, Disp: 100 each, Rfl: 12 .   guaiFENesin (MUCINEX) 600 MG 12 hr tablet, Take 1 tablet by mouth daily., Disp: , Rfl:  .  HYDROcodone-acetaminophen (NORCO/VICODIN) 5-325 MG tablet, Take 1 tablet by mouth every 8 (eight) hours as needed for moderate pain., Disp: 15 tablet, Rfl: 0 .  levocetirizine (XYZAL) 5 MG tablet, TAKE 1 TABLET BY MOUTH ONCE DAILY IN THE EVENING, Disp: 30 tablet, Rfl: 6 .  metoCLOPramide (REGLAN) 5 MG/5ML solution, Take 10 mg by mouth 4 (four) times daily., Disp: , Rfl:  .  metoprolol tartrate (LOPRESSOR) 50 MG tablet, Take 1 tablet by mouth twice daily, Disp: 180 tablet, Rfl: 0 .  montelukast (SINGULAIR) 10 MG tablet, Take 1 tablet (10 mg total) by mouth at bedtime., Disp: 90 tablet, Rfl: 3 .  omeprazole (PRILOSEC) 40 MG capsule, Take 40 mg by mouth daily., Disp: , Rfl:  .  ondansetron (ZOFRAN) 4 MG tablet, Take 1 tablet (4 mg total) by mouth every 8 (eight) hours as needed for nausea or vomiting., Disp: 20 tablet, Rfl: 0 .  ONETOUCH DELICA LANCETS 32K MISC, Use as needed daily, Disp: 100 each, Rfl: 11 .  pioglitazone (ACTOS) 30 MG tablet, Take 1 tablet by mouth once daily (Patient not taking: Reported on 03/24/2020), Disp: 90 tablet, Rfl: 0 .  potassium chloride (KLOR-CON) 20 MEQ packet, Take 20 mEq by mouth daily., Disp: 100 packet, Rfl: 11 .  sildenafil (VIAGRA) 100 MG tablet, TAKE 1/2 TO 1 (ONE-HALF TO ONE) TABLET BY MOUTH ONCE DAILY AS NEEDED FOR ERECTILE DYSFUNCTION, Disp: 4 tablet, Rfl: 0 .  sitaGLIPtin (JANUVIA) 100 MG tablet, Take 1 tablet (100 mg total) by mouth daily. (Patient not taking: Reported on 03/24/2020), Disp: 90 tablet, Rfl: 3 .  tadalafil (CIALIS) 20 MG tablet, Take 0.5-1 tablets (10-20 mg total) by mouth every other day as needed for erectile dysfunction., Disp: 5 tablet, Rfl: 11 .  Tracheostomy Care KIT, 30 kits by Does not apply route daily., Disp: 30 each, Rfl: 6 .  ursodiol (ACTIGALL) 300 MG capsule, Take 300 mg by mouth 2 (two) times daily., Disp: , Rfl:   EXAM:  VITALS per patient  if applicable:  RR between 12-20 bpm  GENERAL: alert, oriented, appears well and in no acute distress  HEENT: atraumatic, conjunctiva clear, no obvious abnormalities on inspection of external nose and ears  NECK: normal movements of the head and neck  LUNGS: on inspection no signs of respiratory distress, breathing rate appears normal, no obvious gross SOB, gasping or wheezing  CV: no obvious cyanosis  MS: moves all visible extremities without noticeable abnormality  PSYCH/NEURO: pleasant and cooperative, no obvious depression or anxiety, speech and thought processing grossly intact  ASSESSMENT AND PLAN:  Discussed the following assessment and plan:  Viral URI with cough  -discussed symptoms likely 2/2 viral illness.  Must also consider COVID-19 virus infection -Continue supportive care -Patient encouraged to consider COVID-19 testing -Will start Flonase and Tessalon -For continued or  worsening symptoms in the next 2 days contact clinic -Given precautions - Plan: fluticasone (FLONASE) 50 MCG/ACT nasal spray, benzonatate (TESSALON) 100 MG capsule  Educated about COVID-19 virus infection -Discussed signs and symptoms of COVID-19 virus infection -Patient encouraged to get second dose of Pfizer COVID-19 virus vaccines. -Questions answered to satisfaction regarding vaccine -Discussed obtaining Covid testing at local site  Follow-up as needed for worsened or continued symptoms    I discussed the assessment and treatment plan with the patient. The patient was provided an opportunity to ask questions and all were answered. The patient agreed with the plan and demonstrated an understanding of the instructions.   The patient was advised to call back or seek an in-person evaluation if the symptoms worsen or if the condition fails to improve as anticipated.   Billie Ruddy, MD

## 2020-06-11 ENCOUNTER — Encounter: Payer: Self-pay | Admitting: Family Medicine

## 2020-07-06 ENCOUNTER — Other Ambulatory Visit: Payer: Self-pay | Admitting: Internal Medicine

## 2020-07-22 ENCOUNTER — Encounter: Payer: Self-pay | Admitting: Internal Medicine

## 2020-07-22 ENCOUNTER — Other Ambulatory Visit: Payer: Self-pay

## 2020-07-22 ENCOUNTER — Ambulatory Visit (INDEPENDENT_AMBULATORY_CARE_PROVIDER_SITE_OTHER): Payer: BLUE CROSS/BLUE SHIELD | Admitting: Internal Medicine

## 2020-07-22 VITALS — BP 130/78 | HR 64 | Temp 98.6°F | Ht 69.0 in | Wt 335.0 lb

## 2020-07-22 DIAGNOSIS — E118 Type 2 diabetes mellitus with unspecified complications: Secondary | ICD-10-CM | POA: Diagnosis not present

## 2020-07-22 DIAGNOSIS — Z0000 Encounter for general adult medical examination without abnormal findings: Secondary | ICD-10-CM | POA: Diagnosis not present

## 2020-07-22 DIAGNOSIS — Z930 Tracheostomy status: Secondary | ICD-10-CM

## 2020-07-22 DIAGNOSIS — E559 Vitamin D deficiency, unspecified: Secondary | ICD-10-CM

## 2020-07-22 DIAGNOSIS — M7989 Other specified soft tissue disorders: Secondary | ICD-10-CM

## 2020-07-22 DIAGNOSIS — I10 Essential (primary) hypertension: Secondary | ICD-10-CM

## 2020-07-22 DIAGNOSIS — E662 Morbid (severe) obesity with alveolar hypoventilation: Secondary | ICD-10-CM

## 2020-07-22 LAB — LIPID PANEL
Cholesterol: 131 mg/dL (ref 0–200)
HDL: 39.4 mg/dL (ref >39.00)
LDL Cholesterol: 75 mg/dL (ref 0–99)
NonHDL: 91.65
Total CHOL/HDL Ratio: 3
Triglycerides: 81 mg/dL (ref 0.0–149.0)
VLDL: 16.2 mg/dL (ref 0.0–40.0)

## 2020-07-22 LAB — MICROALBUMIN / CREATININE URINE RATIO
Creatinine,U: 384.1 mg/dL
Microalb Creat Ratio: 0.9 mg/g (ref 0.0–30.0)
Microalb, Ur: 3.3 mg/dL — ABNORMAL HIGH (ref 0.0–1.9)

## 2020-07-22 LAB — COMPREHENSIVE METABOLIC PANEL
ALT: 18 U/L (ref 0–53)
AST: 17 U/L (ref 0–37)
Albumin: 3.8 g/dL (ref 3.5–5.2)
Alkaline Phosphatase: 75 U/L (ref 39–117)
BUN: 13 mg/dL (ref 6–23)
CO2: 37 mEq/L — ABNORMAL HIGH (ref 19–32)
Calcium: 8.9 mg/dL (ref 8.4–10.5)
Chloride: 99 mEq/L (ref 96–112)
Creatinine, Ser: 0.94 mg/dL (ref 0.40–1.50)
GFR: 103.1 mL/min (ref >60.00)
Glucose, Bld: 120 mg/dL — ABNORMAL HIGH (ref 70–99)
Potassium: 3.1 mEq/L — ABNORMAL LOW (ref 3.5–5.1)
Sodium: 140 mEq/L (ref 135–145)
Total Bilirubin: 0.7 mg/dL (ref 0.2–1.2)
Total Protein: 7.3 g/dL (ref 6.0–8.3)

## 2020-07-22 LAB — HEMOGLOBIN A1C: Hgb A1c MFr Bld: 6 % (ref 4.6–6.5)

## 2020-07-22 MED ORDER — DM-GUAIFENESIN ER 30-600 MG PO TB12: 1.0000 | ORAL_TABLET | Freq: Two times a day (BID) | ORAL | 6 refills | Status: DC

## 2020-07-22 NOTE — Assessment & Plan Note (Signed)
BP is at goal but borderline. He is hoping to stop medications with weight loss but given goal 130/80 would not recommend to stop medications at this time. Will continue close monitoring with weight loss ongoing.

## 2020-07-22 NOTE — Assessment & Plan Note (Signed)
Will need another sleep study once he reaches his new normal weight.

## 2020-07-22 NOTE — Assessment & Plan Note (Signed)
Checking HgA1c today. Has already had regimen change due to weight loss 100 pounds s/p weight loss surgery.

## 2020-07-22 NOTE — Patient Instructions (Signed)
I would recommend to try yoga or tai chi for balance.     Health Maintenance, Male Adopting a healthy lifestyle and getting preventive care are important in promoting health and wellness. Ask your health care provider about:  The right schedule for you to have regular tests and exams.  Things you can do on your own to prevent diseases and keep yourself healthy. What should I know about diet, weight, and exercise? Eat a healthy diet   Eat a diet that includes plenty of vegetables, fruits, low-fat dairy products, and lean protein.  Do not eat a lot of foods that are high in solid fats, added sugars, or sodium. Maintain a healthy weight Body mass index (BMI) is a measurement that can be used to identify possible weight problems. It estimates body fat based on height and weight. Your health care provider can help determine your BMI and help you achieve or maintain a healthy weight. Get regular exercise Get regular exercise. This is one of the most important things you can do for your health. Most adults should:  Exercise for at least 150 minutes each week. The exercise should increase your heart rate and make you sweat (moderate-intensity exercise).  Do strengthening exercises at least twice a week. This is in addition to the moderate-intensity exercise.  Spend less time sitting. Even light physical activity can be beneficial. Watch cholesterol and blood lipids Have your blood tested for lipids and cholesterol at 38 years of age, then have this test every 5 years. You may need to have your cholesterol levels checked more often if:  Your lipid or cholesterol levels are high.  You are older than 38 years of age.  You are at high risk for heart disease. What should I know about cancer screening? Many types of cancers can be detected early and may often be prevented. Depending on your health history and family history, you may need to have cancer screening at various ages. This may  include screening for:  Colorectal cancer.  Prostate cancer.  Skin cancer.  Lung cancer. What should I know about heart disease, diabetes, and high blood pressure? Blood pressure and heart disease  High blood pressure causes heart disease and increases the risk of stroke. This is more likely to develop in people who have high blood pressure readings, are of African descent, or are overweight.  Talk with your health care provider about your target blood pressure readings.  Have your blood pressure checked: ? Every 3-5 years if you are 18-39 years of age. ? Every year if you are 40 years old or older.  If you are between the ages of 65 and 75 and are a current or former smoker, ask your health care provider if you should have a one-time screening for abdominal aortic aneurysm (AAA). Diabetes Have regular diabetes screenings. This checks your fasting blood sugar level. Have the screening done:  Once every three years after age 45 if you are at a normal weight and have a low risk for diabetes.  More often and at a younger age if you are overweight or have a high risk for diabetes. What should I know about preventing infection? Hepatitis B If you have a higher risk for hepatitis B, you should be screened for this virus. Talk with your health care provider to find out if you are at risk for hepatitis B infection. Hepatitis C Blood testing is recommended for:  Everyone born from 1945 through 1965.  Anyone with known risk   factors for hepatitis C. Sexually transmitted infections (STIs)  You should be screened each year for STIs, including gonorrhea and chlamydia, if: ? You are sexually active and are younger than 38 years of age. ? You are older than 37 years of age and your health care provider tells you that you are at risk for this type of infection. ? Your sexual activity has changed since you were last screened, and you are at increased risk for chlamydia or gonorrhea. Ask your  health care provider if you are at risk.  Ask your health care provider about whether you are at high risk for HIV. Your health care provider may recommend a prescription medicine to help prevent HIV infection. If you choose to take medicine to prevent HIV, you should first get tested for HIV. You should then be tested every 3 months for as long as you are taking the medicine. Follow these instructions at home: Lifestyle  Do not use any products that contain nicotine or tobacco, such as cigarettes, e-cigarettes, and chewing tobacco. If you need help quitting, ask your health care provider.  Do not use street drugs.  Do not share needles.  Ask your health care provider for help if you need support or information about quitting drugs. Alcohol use  Do not drink alcohol if your health care provider tells you not to drink.  If you drink alcohol: ? Limit how much you have to 0-2 drinks a day. ? Be aware of how much alcohol is in your drink. In the [place] one drink equals one 12 oz bottle of beer (355 mL), one 5 oz glass of wine (148 mL), or one 1 oz glass of hard liquor (44 mL). General instructions  Schedule regular health, dental, and eye exams.  Stay current with your vaccines.  Tell your health care provider if: ? You often feel depressed. ? You have ever been abused or do not feel safe at home. Summary  Adopting a healthy lifestyle and getting preventive care are important in promoting health and wellness.  Follow your health care provider's instructions about healthy diet, exercising, and getting tested or screened for diseases.  Follow your health care provider's instructions on monitoring your cholesterol and blood pressure. This information is not intended to replace advice given to you by your health care provider. Make sure you discuss any questions you have with your health care provider. Document Revised: 07/10/2018 Document Reviewed: 07/10/2018 Elsevier Patient Education   2020 [employer].

## 2020-07-22 NOTE — Assessment & Plan Note (Signed)
Recently advised to take 5000 units daily. Will need recheck through bariatrics in several months.

## 2020-07-22 NOTE — Assessment & Plan Note (Signed)
Flu shot declines. Covid-19 reports having but did not bring card today. Pneumonia up to date. Tetanus up to date. Counseled about sun safety and mole surveillance. Counseled about the dangers of distracted driving. Given 10 year screening recommendations.

## 2020-07-22 NOTE — Progress Notes (Signed)
   Subjective:   Patient ID: Derrick [name], male    DOB: 1981-03-23, 39 y.o.   MRN: [id number]  HPI The patient is a 39 YO man coming in for physical.   PMH, FMH, social history reviewed and updated  Review of Systems  Constitutional: Negative.   HENT: Positive for congestion.   Eyes: Negative.   Respiratory: Negative for cough, chest tightness and shortness of breath.   Cardiovascular: Negative for chest pain, palpitations and leg swelling.  Gastrointestinal: Negative for abdominal distention, abdominal pain, constipation, diarrhea, nausea and vomiting.  Musculoskeletal: Negative.   Skin: Negative.   Neurological: Negative.   Psychiatric/Behavioral: Negative.     Objective:  Physical Exam Constitutional:      Appearance: He is well-developed and well-nourished. He is obese.  HENT:     Head: Normocephalic and atraumatic.  Eyes:     Extraocular Movements: EOM normal.  Cardiovascular:     Rate and Rhythm: Normal rate and regular rhythm.  Pulmonary:     Effort: Pulmonary effort is normal. No respiratory distress.     Breath sounds: Normal breath sounds. No wheezing or rales.  Abdominal:     General: Bowel sounds are normal. There is no distension.     Palpations: Abdomen is soft.     Tenderness: There is no abdominal tenderness. There is no rebound.  Musculoskeletal:        General: No edema.     Cervical back: Normal range of motion.  Skin:    General: Skin is warm and dry.  Neurological:     Mental Status: He is alert and oriented to person, place, and time.     Coordination: Coordination normal.  Psychiatric:        Mood and Affect: Mood and affect normal.     Vitals:   07/22/20 1100 07/22/20 1124  BP: (!) 144/72 130/78  Pulse: 64   Temp: 98.6 F (37 C)   TempSrc: Oral   SpO2: 99%   Weight: (!) 335 lb (152 kg)   Height: 5\' 9"  (1.753 m)     This visit occurred during the SARS-CoV-2 public health emergency.  Safety protocols were in place,  including screening questions prior to the visit, additional usage of staff PPE, and extensive cleaning of exam room while observing appropriate contact time as indicated for disinfecting solutions.   Assessment & Plan:

## 2020-07-22 NOTE — Assessment & Plan Note (Signed)
Counseled that with continued weight loss s/p procedure he may be able to have trach removed and encouraged to follow up with pulmonary once he reaches his new normal weight for repeat sleep study.

## 2020-07-22 NOTE — Assessment & Plan Note (Signed)
Weight down 100 pounds with weight loss procedure and encouraged to continue with weight loss.

## 2020-08-04 ENCOUNTER — Other Ambulatory Visit: Payer: Self-pay | Admitting: Acute Care

## 2020-08-04 ENCOUNTER — Other Ambulatory Visit (HOSPITAL_COMMUNITY)
Admission: RE | Admit: 2020-08-04 | Discharge: 2020-08-04 | Disposition: A | Discharge status: Home / Self Care | Payer: BLUE CROSS/BLUE SHIELD | Source: Ambulatory Visit | Attending: Orthopedic Surgery | Admitting: Orthopedic Surgery

## 2020-08-04 DIAGNOSIS — Z20822 Contact with and (suspected) exposure to covid-19: Secondary | ICD-10-CM | POA: Diagnosis present

## 2020-08-04 DIAGNOSIS — Z930 Tracheostomy status: Secondary | ICD-10-CM

## 2020-08-04 LAB — SARS CORONAVIRUS 2 BY RT PCR (HOSPITAL ORDER, PERFORMED IN ~~LOC~~ HOSPITAL LAB): SARS Coronavirus 2: NEGATIVE

## 2020-08-05 ENCOUNTER — Inpatient Hospital Stay (HOSPITAL_COMMUNITY): Admission: RE | Admit: 2020-08-05 | Payer: BLUE CROSS/BLUE SHIELD | Source: Ambulatory Visit

## 2020-08-06 ENCOUNTER — Other Ambulatory Visit: Payer: Self-pay

## 2020-08-06 ENCOUNTER — Ambulatory Visit (HOSPITAL_COMMUNITY)
Admission: RE | Admit: 2020-08-06 | Discharge: 2020-08-06 | Disposition: A | Discharge status: Home / Self Care | Payer: BLUE CROSS/BLUE SHIELD | Source: Ambulatory Visit | Attending: Acute Care | Admitting: Acute Care

## 2020-08-06 ENCOUNTER — Ambulatory Visit (HOSPITAL_BASED_OUTPATIENT_CLINIC_OR_DEPARTMENT_OTHER)
Admission: RE | Admit: 2020-08-06 | Discharge: 2020-08-06 | Disposition: A | Discharge status: Home / Self Care | Payer: BLUE CROSS/BLUE SHIELD | Source: Ambulatory Visit | Attending: Internal Medicine | Admitting: Internal Medicine

## 2020-08-06 DIAGNOSIS — Z930 Tracheostomy status: Secondary | ICD-10-CM | POA: Diagnosis not present

## 2020-08-06 DIAGNOSIS — M7989 Other specified soft tissue disorders: Secondary | ICD-10-CM | POA: Insufficient documentation

## 2020-08-06 DIAGNOSIS — G4733 Obstructive sleep apnea (adult) (pediatric): Secondary | ICD-10-CM | POA: Diagnosis not present

## 2020-08-06 NOTE — Progress Notes (Signed)
Reason for visit   Plan tracheostomy care  History of present illness  40 year old male patient well-known to me I follow for tracheostomy dependence in the setting of OHS OSA, he has been followed here at the tracheostomy clinic for about 5 years. Presents today for planned and routine tracheostomy change. Of note Derrick [name] we recently underwent gastric sleeve surgery back in August of this year, he continues to show dramatic improvement on every follow-up visit. He is down another 75 pounds since the last time I saw him, he reports his activity tolerance is remarkably improved, he is able to do things he has not done since he was a teenager. He is excited about the prospect of eventually getting his tracheostomy removed. Presents today for planned tracheostomy change reporting no problems in regards to his tracheostomy in general.  Review of systems  Review of Systems  Constitutional: Negative.   HENT: Negative.   Eyes: Negative.   Respiratory: Negative.   Gastrointestinal: Negative.   Genitourinary: Negative.   Skin: Negative.   Neurological: Negative.   Endo/Heme/Allergies: Negative.   Psychiatric/Behavioral: Negative.    Physical exam  General 40 year old black male currently ambulatory, in absolutely no distress, well-nourished HEENT normocephalic, atraumatic. Tracheostomy site unremarkable. Pulmonary: Clear to auscultation Cardiac regular rate and rhythm Abdomen soft nontender Extremities warm dry no edema Neuro intact  Activity tolerance: Now ambulatory walking the entire length of the [facility] without exertional dyspnea, able to walk and talk without trouble. Tells me even further at home able to actually play with his child -Is active in a local church singing in choir Has not been using CPAP machine, currently does not care for the degree of pressure   Procedure  The existing size 6 tracheostomy tube was removed, site was evaluated and was found to be unremarkable.  Using sterile lidocaine jelly the tracheostomy stoma was prepped, and a new size 6 Bivona Portex trach was placed without difficulty end-tidal CO2 confirmed patient tolerated well  Impression/plan  Tracheostomy dependence OSA/OHS overlap Obesity Status post gastric sleeve  Discussion Derrick [name] continues to make remarkable improvement, his weight is significantly down and activity tolerance continues to improve. I do think he will be able to be decannulated sometime this year. Our goal is to get him less than 200 pounds I think he is going to be successful with this of note he started at over 400. My only concern for Derrick [name] is that he does not care for CPAP therapy. He finds the noise and pressure disruptive to his sleep. I had a long discussion with him about this, I am concerned that even with his weight loss as he is still a large framed man I suspect he may still have some degree of obstructive sleep apnea. Because of this I have asked him to reconsider the potential need for this if this is what is required to keep him healthy. He is open to this. I do however think that some of his tolerance issue with this is secondary to pressure required, and my hope is that with ongoing weight loss the degree of pressure he needs will be less, and also the hope is that he will not require oxygen. This will dramatically increase what he can do physically in regards to travel and social events which is high on his list of priorities  Plan Continue routine tracheostomy care Continue active lifestyle and exercise with focus on weight loss I will see him again in about 8 to 10 weeks, I  am going to ask that he sees Dr. Craige Cotta again around that time I think we need to look at reevaluating his sleep study in regards to how much CPAP he needs, I think the less pressure required the more likely he is to be compliant  I visit time 32 minutes  [name] ACNP-BC [facility] Pulmonary/Critical Care Pager # [phone] OR #  [phone] if no answer

## 2020-08-06 NOTE — Progress Notes (Addendum)
Tracheostomy Procedure Note  Derrick [name] [id number] 1981/04/22  Pre Procedure Tracheostomy Information  Trach Brand: Shiley Size: 6.0 Bivona Portex 60A160 Style: Uncuffed Secured by: Velcro   Procedure: Trach change and Trach cleaning    Post Procedure Tracheostomy Information  Trach Brand: Shiley Size: 6.0 Bivona Portex 60A160 Style: Uncuffed Secured by: Velcro   Post Procedure Evaluation:  ETCO2 positive color change from yellow to purple : Yes.   Vital signs:VSS Patients current condition: stable Complications: No apparent complications Trach site exam: clean and dry  Wound care done: dry Patient did tolerate procedure well.   Education: none  Prescription needs: none    Additional needs: none

## 2020-08-09 ENCOUNTER — Telehealth: Payer: Self-pay | Admitting: Internal Medicine

## 2020-08-09 DIAGNOSIS — I872 Venous insufficiency (chronic) (peripheral): Secondary | ICD-10-CM

## 2020-08-09 NOTE — Telephone Encounter (Signed)
cardiovascular imaging  Patient wants a call regarding next steps   [phone]

## 2020-08-10 NOTE — Telephone Encounter (Signed)
Called pt to inform of below. No answer/unable to leave vm. Will try again later.    There is a lot of reflux in the right leg veins, left leg fairly normal.

## 2020-08-11 NOTE — Telephone Encounter (Signed)
Pt informed of below.  He wants to know if he needs any additional tests or treatment for the reflux in the right side veins and what does he need to do about his swelling. Please advise.

## 2020-08-12 NOTE — Telephone Encounter (Signed)
Referral done

## 2020-08-12 NOTE — Telephone Encounter (Signed)
Pt informed of below. He states he is wearing his compression stocking, he is not able to eat much sodium since his gastric bypass and both his legs are swelling today. He believes something else is going on and requests the referral to vascular surgeon.

## 2020-08-12 NOTE — Telephone Encounter (Signed)
We can refer to vascular surgery if desired for the right leg however it is likely the swelling will be chronic and there may not be much we can do about it other than compression stockings and low sodium diet.

## 2020-08-19 ENCOUNTER — Other Ambulatory Visit: Payer: Self-pay

## 2020-08-19 ENCOUNTER — Ambulatory Visit: Payer: BLUE CROSS/BLUE SHIELD | Admitting: Vascular Surgery

## 2020-08-19 ENCOUNTER — Encounter: Payer: Self-pay | Admitting: Vascular Surgery

## 2020-08-19 VITALS — BP 145/85 | HR 74 | Temp 98.1°F | Resp 18 | Ht 70.0 in | Wt 337.0 lb

## 2020-08-19 DIAGNOSIS — I83811 Varicose veins of right lower extremities with pain: Secondary | ICD-10-CM | POA: Diagnosis not present

## 2020-08-19 DIAGNOSIS — I872 Venous insufficiency (chronic) (peripheral): Secondary | ICD-10-CM | POA: Diagnosis not present

## 2020-08-19 NOTE — Progress Notes (Signed)
REASON FOR CONSULT:    Varicose veins of the right lower extremity.  The consult is requested by Dr. Pricilla Holm.  ASSESSMENT & PLAN:   CHRONIC VENOUS DISEASE: This patient has CEAP C4 venous disease.  He has leg swelling bilaterally but more significantly on the left side and some aching pain in both legs when he is standing for prolonged period of time.  He did not have any significant deep venous reflux on the left based on his previous study but did have some superficial venous reflux that was not very advanced.  We have discussed conservative measures.  Specifically we discussed the importance of daily leg elevation and the proper positioning for this.  In addition I encouraged him to continue to wear his knee-high compression stockings.  We discussed the importance of exercise specifically walking and water aerobics.  I encouraged him to avoid prolonged sitting and standing.  We also discussed the importance of maintaining a healthy weight as central obesity especially increases lower extremity venous pressure.  Is lost 100 pounds since his gastric sleeve.  When I looked at his left great saphenous vein myself the vein appeared to be significantly dilated and if his reflux progresses I think he might eventually be a candidate for laser ablation of the left great saphenous vein.  I have ordered a follow-up reflux study in 6 months and I will see him back at that time.  On the right side the vein did not appear especially dilated and his symptoms are not as significant on the right side.  I reassured him that he had no evidence of DVT.  I also reassured him that he had no evidence of arterial insufficiency.  Deitra Mayo, MD Office: [phone]   HPI:   Derrick [name] is a pleasant 40 y.o. male, who is referred with varicose veins of the right lower extremity.  I have reviewed the records from the referring office.  The patient was seen by Dr. Sharlet Salina on 07/22/2020 for routine  follow-up visit.  Patient's diabetes and blood pressure was under good control.  He is undergone previous previous bariatric surgery.  He has lost significant weight.  He was subsequently seen by the CMA on 08/09/2020 complaining of leg swelling.  This was mostly in the right leg.  The patient was referred for vascular consultation.  On my history the patient has a long history of some discoloration in both legs consistent with chronic venous insufficiency.  He said no previous history of DVT.  He has had no previous venous procedures.  He describes swelling in both legs which is more significant on the left.  He also describes some aching pain and heaviness when he standing which is relieved with elevation.  He does work quite a bit on his feet and his symptoms are worse at the end of the day.  He is worked at [employer] for long hours and that is when his symptoms are worse.  Is lost 100 pounds since his gastric sleeve.  He was 140 pounds in August 2021 and in January of this year he was 240 pounds.  His hemoglobin A1c is dropped from 8 to less than 6.  He seems very motivated.  Past Medical History:  Diagnosis Date  . Acute on chronic diastolic CHF (congestive heart failure), NYHA class 1 ([place])   . Diabetes mellitus without complication ([place])   . GERD (gastroesophageal reflux disease)   . Hypertension   . Hypoxemia   .  Morbid obesity ([place])   . Obesity hypoventilation syndrome ([place])   . OSA (obstructive sleep apnea)   . Reflux     Family History  Problem Relation Age of Onset  . Diabetes Maternal Grandmother   . Hypertension Maternal Grandmother   . Asthma Other   . Cancer Other   . Stroke Other   . Heart disease Other     SOCIAL HISTORY: Social History   Socioeconomic History  . Marital status: Single    Spouse name: Not on file  . Number of children: Not on file  . Years of education: Not on file  . Highest education level: Not on file  Occupational History  . Not  on file  Tobacco Use  . Smoking status: Never Smoker  . Smokeless tobacco: Never Used  Substance and Sexual Activity  . Alcohol use: No  . Drug use: No  . Sexual activity: Not on file  Other Topics Concern  . Not on file  Social History Narrative  . Not on file   Social Determinants of Health   Financial Resource Strain: Not on file  Food Insecurity: Not on file  Transportation Needs: Not on file  Physical Activity: Not on file  Stress: Not on file  Social Connections: Not on file  Intimate Partner Violence: Not on file    Allergies  Allergen Reactions  . Vancomycin Other (See Comments)    Affected kidneys   . Shrimp [Shellfish Allergy]   . Zosyn [Piperacillin Sod-Tazobactam So] Other (See Comments)    Current Outpatient Medications  Medication Sig Dispense Refill  . acetaminophen (TYLENOL) 325 MG tablet Take 2 tablets (650 mg total) by mouth every 6 (six) hours as needed for mild pain or headache (fever >/= 101).    Marland Kitchen albuterol (PROVENTIL HFA;VENTOLIN HFA) 108 (90 Base) MCG/ACT inhaler Inhale 2 puffs into the lungs every 6 (six) hours as needed for wheezing or shortness of breath. 1 Inhaler 0  . allopurinol (ZYLOPRIM) 300 MG tablet Take 1 tablet by mouth once daily 90 tablet 0  . benzonatate (TESSALON) 100 MG capsule Take 1 capsule (100 mg total) by mouth 2 (two) times daily as needed for cough. 20 capsule 0  . Blood Glucose Monitoring Suppl (BAYER CONTOUR NEXT MONITOR) w/Device KIT Use to check blood sugars daily Dx E11.9 1 kit 0  . clindamycin (CLINDAGEL) 1 % gel Apply 1 application topically daily.    Marland Kitchen dextromethorphan-guaiFENesin (MUCINEX DM) 30-600 MG 12hr tablet Take 1 tablet by mouth 2 (two) times daily. 60 tablet 6  . fluconazole (DIFLUCAN) 150 MG tablet Take 150 mg by mouth once.    . fluticasone (FLONASE) 50 MCG/ACT nasal spray Place 2 sprays into both nostrils daily. (Patient taking differently: Place 2 sprays into both nostrils daily as needed for allergies.)  16 g 6  . fluticasone (FLONASE) 50 MCG/ACT nasal spray Place 1 spray into both nostrils daily. 16 g 0  . fluticasone (FLOVENT HFA) 110 MCG/ACT inhaler Inhale 2 puffs into the lungs 2 (two) times daily. (Patient taking differently: Inhale 2 puffs into the lungs 2 (two) times daily as needed (shortness of breath).) 1 Inhaler 2  . furosemide (LASIX) 80 MG tablet Take 2 tablets (160 mg total) by mouth 3 (three) times daily as needed. 180 tablet 11  . gabapentin (NEURONTIN) 300 MG capsule Take 3 capsules (900 mg total) by mouth 3 (three) times daily. 270 capsule 6  . glucose blood test strip Use as instructed 100 each 12  .  guaiFENesin (MUCINEX) 600 MG 12 hr tablet Take 1 tablet by mouth daily.    Marland Kitchen HYDROcodone-acetaminophen (NORCO/VICODIN) 5-325 MG tablet Take 1 tablet by mouth every 8 (eight) hours as needed for moderate pain. 15 tablet 0  . levocetirizine (XYZAL) 5 MG tablet TAKE 1 TABLET BY MOUTH ONCE DAILY IN THE EVENING 30 tablet 6  . metoCLOPramide (REGLAN) 5 MG/5ML solution Take 10 mg by mouth 4 (four) times daily.    . metoprolol tartrate (LOPRESSOR) 50 MG tablet Take 1 tablet by mouth twice daily 180 tablet 0  . montelukast (SINGULAIR) 10 MG tablet Take 1 tablet (10 mg total) by mouth at bedtime. 90 tablet 3  . omeprazole (PRILOSEC) 40 MG capsule Take 40 mg by mouth daily.    . ondansetron (ZOFRAN) 4 MG tablet Take 1 tablet (4 mg total) by mouth every 8 (eight) hours as needed for nausea or vomiting. 20 tablet 0  . ONETOUCH DELICA LANCETS 35T MISC Use as needed daily 100 each 11  . pioglitazone (ACTOS) 30 MG tablet Take 1 tablet by mouth once daily 90 tablet 0  . potassium chloride (KLOR-CON) 20 MEQ packet Take 20 mEq by mouth daily. 100 packet 11  . sildenafil (VIAGRA) 100 MG tablet TAKE 1/2 TO 1 (ONE-HALF TO ONE) TABLET BY MOUTH ONCE DAILY AS NEEDED FOR ERECTILE DYSFUNCTION 4 tablet 0  . sitaGLIPtin (JANUVIA) 100 MG tablet Take 1 tablet (100 mg total) by mouth daily. 90 tablet 3  .  tadalafil (CIALIS) 20 MG tablet Take 0.5-1 tablets (10-20 mg total) by mouth every other day as needed for erectile dysfunction. 5 tablet 11  . Tracheostomy Care KIT 30 kits by Does not apply route daily. 30 each 6  . ursodiol (ACTIGALL) 300 MG capsule Take 300 mg by mouth 2 (two) times daily.     No current facility-administered medications for this visit.    REVIEW OF SYSTEMS:  $RemoveB'[X]'PVNiXBff$  denotes positive finding, $RemoveBeforeDEI'[ ]'EZGIyxpgdOLwjrBS$  denotes negative finding Cardiac  Comments:  Chest pain or chest pressure:    Shortness of breath upon exertion:    Short of breath when lying flat:    Irregular heart rhythm:        Vascular    Pain in calf, thigh, or hip brought on by ambulation:    Pain in feet at night that wakes you up from your sleep:     Blood clot in your veins:    Leg swelling:  x       Pulmonary    Oxygen at home:    Productive cough:     Wheezing:         Neurologic    Sudden weakness in arms or legs:     Sudden numbness in arms or legs:     Sudden onset of difficulty speaking or slurred speech:    Temporary loss of vision in one eye:     Problems with dizziness:         Gastrointestinal    Blood in stool:     Vomited blood:         Genitourinary    Burning when urinating:     Blood in urine:        Psychiatric    Major depression:         Hematologic    Bleeding problems:    Problems with blood clotting too easily:        Skin    Rashes or ulcers:  Constitutional    Fever or chills:     PHYSICAL EXAM:   Vitals:   08/19/20 1105  BP: (!) 145/85  Pulse: 74  Resp: 18  Temp: 98.1 F (36.7 C)  TempSrc: Temporal  SpO2: 97%  Weight: (!) 337 lb (152.9 kg)  Height: $Remove'5\' 10"'uIKzAFA$  (1.778 m)    Body mass index is 48.35 kg/m.  GENERAL: The patient is a well-nourished male, in no acute distress. The vital signs are documented above. CARDIAC: There is a regular rate and rhythm.  VASCULAR: I do not detect carotid bruits. He has palpable dorsalis pedis pulses bilaterally.   I could not palpate posterior tibial pulse on the right however he had a biphasic dorsalis pedis and posterior tibial signal bilaterally. He has hyperpigmentation bilaterally consistent with chronic venous insufficiency.    I looked at both great saphenous veins myself with the SonoSite.  On the left side the vein did seem significantly dilated throughout.  It gave off a tributary in the proximal thigh feeding the cluster of varicose veins deep to the skin.  On the right side the vein was not especially dilated.  PULMONARY: There is good air exchange bilaterally without wheezing or rales. ABDOMEN: Soft and non-tender with normal pitched bowel sounds.  MUSCULOSKELETAL: There are no major deformities or cyanosis. NEUROLOGIC: No focal weakness or paresthesias are detected. SKIN: There are no ulcers or rashes noted. PSYCHIATRIC: The patient has a normal affect.  DATA:    VENOUS REFLUX STUDY: I reviewed the venous reflux study that was done on 08/06/2020.  Although the report says right after discussing with the patient this was a study of the left leg.  There was no evidence of DVT or superficial venous thrombosis.  There was no evidence of deep venous reflux.  There was spotty areas of superficial venous reflux in the great saphenous vein.  This was in the mid thigh and distal thigh.  There was no reflux at the saphenofemoral junction.  Previous venous duplex to rule out a DVT in April 2021 showed no evidence of DVT in the left lower extremity.

## 2020-08-20 ENCOUNTER — Other Ambulatory Visit: Payer: Self-pay

## 2020-08-20 DIAGNOSIS — I83811 Varicose veins of right lower extremities with pain: Secondary | ICD-10-CM

## 2020-09-21 ENCOUNTER — Other Ambulatory Visit: Payer: Self-pay | Admitting: Family Medicine

## 2020-09-21 ENCOUNTER — Other Ambulatory Visit (HOSPITAL_COMMUNITY)
Admission: RE | Admit: 2020-09-21 | Discharge: 2020-09-21 | Disposition: A | Discharge status: Home / Self Care | Payer: BLUE CROSS/BLUE SHIELD | Source: Ambulatory Visit | Attending: Acute Care | Admitting: Acute Care

## 2020-09-21 DIAGNOSIS — Z20822 Contact with and (suspected) exposure to covid-19: Secondary | ICD-10-CM | POA: Insufficient documentation

## 2020-09-21 DIAGNOSIS — Z01812 Encounter for preprocedural laboratory examination: Secondary | ICD-10-CM | POA: Insufficient documentation

## 2020-09-21 DIAGNOSIS — J069 Acute upper respiratory infection, unspecified: Secondary | ICD-10-CM

## 2020-09-21 LAB — SARS CORONAVIRUS 2 (TAT 6-24 HRS): SARS Coronavirus 2: NEGATIVE

## 2020-09-22 ENCOUNTER — Ambulatory Visit (HOSPITAL_COMMUNITY): Payer: BLUE CROSS/BLUE SHIELD

## 2020-09-22 ENCOUNTER — Telehealth (INDEPENDENT_AMBULATORY_CARE_PROVIDER_SITE_OTHER): Payer: BLUE CROSS/BLUE SHIELD | Admitting: Family

## 2020-09-22 DIAGNOSIS — J019 Acute sinusitis, unspecified: Secondary | ICD-10-CM

## 2020-09-22 MED ORDER — BENZONATATE 100 MG PO CAPS: 100.0000 mg | ORAL_CAPSULE | Freq: Two times a day (BID) | ORAL | 0 refills | Status: DC | PRN

## 2020-09-22 MED ORDER — DOXYCYCLINE HYCLATE 100 MG PO TABS: 100.0000 mg | ORAL_TABLET | Freq: Two times a day (BID) | ORAL | 0 refills | Status: DC

## 2020-09-22 MED ORDER — BENZONATATE 100 MG PO CAPS: 100.0000 mg | ORAL_CAPSULE | Freq: Three times a day (TID) | ORAL | 0 refills | Status: DC | PRN

## 2020-09-22 NOTE — Progress Notes (Signed)
Derrick [name] is a 40 y.o. male with the following history as recorded in Epic[employer]:  Patient Active Problem List   Diagnosis Date Noted  . Atypical chest pain 02/09/2020  . Left leg swelling 11/24/2019  . Chronic respiratory failure with hypoxia ([place]) 06/17/2019  . Chronic pain syndrome 06/17/2019  . Vitamin D deficiency 11/12/2017  . Tinnitus 09/27/2017  . Numbness and tingling of hand 11/30/2016  . Tracheostomy dependence ([place])   . Routine general medical examination at a health care facility 09/21/2016  . Acne 09/21/2016  . ED (erectile dysfunction) 06/02/2016  . Other allergic rhinitis   . Diabetes mellitus type 2 with complications ([place]) 85/88/5027  . Dysphagia   . Pulmonary hypertension ([place])   . Morbid obesity ([place]) 06/13/2015  . Essential hypertension 06/13/2015  . OSA (obstructive sleep apnea) 06/13/2015  . Obesity hypoventilation syndrome ([place]) 06/13/2015  . Chronic diastolic heart failure ([place]) 06/13/2015    Current Outpatient Medications  Medication Sig Dispense Refill  . benzonatate (TESSALON) 100 MG capsule Take 1 capsule (100 mg total) by mouth 3 (three) times daily as needed. 20 capsule 0  . doxycycline (VIBRA-TABS) 100 MG tablet Take 1 tablet (100 mg total) by mouth 2 (two) times daily. 20 tablet 0  . acetaminophen (TYLENOL) 325 MG tablet Take 2 tablets (650 mg total) by mouth every 6 (six) hours as needed for mild pain or headache (fever >/= 101).    Marland Kitchen albuterol (PROVENTIL HFA;VENTOLIN HFA) 108 (90 Base) MCG/ACT inhaler Inhale 2 puffs into the lungs every 6 (six) hours as needed for wheezing or shortness of breath. 1 Inhaler 0  . allopurinol (ZYLOPRIM) 300 MG tablet Take 1 tablet by mouth once daily 90 tablet 0  . benzonatate (TESSALON) 100 MG capsule Take 1 capsule (100 mg total) by mouth 2 (two) times daily as needed for cough. 20 capsule 0  . Blood Glucose Monitoring Suppl (BAYER CONTOUR NEXT MONITOR) w/Device KIT Use to check blood sugars daily Dx E11.9  (Patient not taking: Reported on 08/19/2020) 1 kit 0  . clindamycin (CLINDAGEL) 1 % gel Apply 1 application topically daily.    Marland Kitchen dextromethorphan-guaiFENesin (MUCINEX DM) 30-600 MG 12hr tablet Take 1 tablet by mouth 2 (two) times daily. (Patient not taking: Reported on 08/19/2020) 60 tablet 6  . fluconazole (DIFLUCAN) 150 MG tablet Take 150 mg by mouth once. (Patient not taking: Reported on 08/19/2020)    . fluticasone (FLONASE) 50 MCG/ACT nasal spray Place 2 sprays into both nostrils daily. (Patient taking differently: Place 2 sprays into both nostrils daily as needed for allergies.) 16 g 6  . fluticasone (FLONASE) 50 MCG/ACT nasal spray Place 1 spray into both nostrils daily. 16 g 0  . fluticasone (FLOVENT HFA) 110 MCG/ACT inhaler Inhale 2 puffs into the lungs 2 (two) times daily. (Patient taking differently: Inhale 2 puffs into the lungs 2 (two) times daily as needed (shortness of breath).) 1 Inhaler 2  . furosemide (LASIX) 80 MG tablet Take 2 tablets (160 mg total) by mouth 3 (three) times daily as needed. (Patient not taking: Reported on 08/19/2020) 180 tablet 11  . gabapentin (NEURONTIN) 300 MG capsule Take 3 capsules (900 mg total) by mouth 3 (three) times daily. 270 capsule 6  . glucose blood test strip Use as instructed (Patient not taking: Reported on 08/19/2020) 100 each 12  . guaiFENesin (MUCINEX) 600 MG 12 hr tablet Take 1 tablet by mouth daily.    Marland Kitchen HYDROcodone-acetaminophen (NORCO/VICODIN) 5-325 MG tablet Take 1 tablet by  mouth every 8 (eight) hours as needed for moderate pain. 15 tablet 0  . levocetirizine (XYZAL) 5 MG tablet TAKE 1 TABLET BY MOUTH ONCE DAILY IN THE EVENING 30 tablet 6  . metoCLOPramide (REGLAN) 5 MG/5ML solution Take 10 mg by mouth 4 (four) times daily. (Patient not taking: Reported on 08/19/2020)    . metoprolol tartrate (LOPRESSOR) 50 MG tablet Take 1 tablet by mouth twice daily 180 tablet 0  . montelukast (SINGULAIR) 10 MG tablet Take 1 tablet (10 mg total) by mouth at  bedtime. 90 tablet 3  . omeprazole (PRILOSEC) 40 MG capsule Take 40 mg by mouth daily. (Patient not taking: Reported on 08/19/2020)    . ondansetron (ZOFRAN) 4 MG tablet Take 1 tablet (4 mg total) by mouth every 8 (eight) hours as needed for nausea or vomiting. 20 tablet 0  . ONETOUCH DELICA LANCETS 29N MISC Use as needed daily (Patient not taking: Reported on 08/19/2020) 100 each 11  . pioglitazone (ACTOS) 30 MG tablet Take 1 tablet by mouth once daily (Patient not taking: Reported on 08/19/2020) 90 tablet 0  . potassium chloride (KLOR-CON) 20 MEQ packet Take 20 mEq by mouth daily. (Patient not taking: Reported on 08/19/2020) 100 packet 11  . sildenafil (VIAGRA) 100 MG tablet TAKE 1/2 TO 1 (ONE-HALF TO ONE) TABLET BY MOUTH ONCE DAILY AS NEEDED FOR ERECTILE DYSFUNCTION 4 tablet 0  . sitaGLIPtin (JANUVIA) 100 MG tablet Take 1 tablet (100 mg total) by mouth daily. (Patient not taking: Reported on 08/19/2020) 90 tablet 3  . tadalafil (CIALIS) 20 MG tablet Take 0.5-1 tablets (10-20 mg total) by mouth every other day as needed for erectile dysfunction. 5 tablet 11  . Tracheostomy Care KIT 30 kits by Does not apply route daily. (Patient not taking: Reported on 08/19/2020) 30 each 6  . ursodiol (ACTIGALL) 300 MG capsule Take 300 mg by mouth 2 (two) times daily.     No current facility-administered medications for this visit.    Allergies: Vancomycin, Shrimp [shellfish allergy], and Zosyn [piperacillin sod-tazobactam so]  Past Medical History:  Diagnosis Date  . Acute on chronic diastolic CHF (congestive heart failure), NYHA class 1 ([place])   . Diabetes mellitus without complication ([place])   . GERD (gastroesophageal reflux disease)   . Hypertension   . Hypoxemia   . Morbid obesity ([place])   . Obesity hypoventilation syndrome ([place])   . OSA (obstructive sleep apnea)   . Reflux     Past Surgical History:  Procedure Laterality Date  . TRACHEOSTOMY TUBE PLACEMENT N/A 06/21/2015   Procedure: TRACHEOSTOMY;   Surgeon: Leta Baptist, MD;  Location: [facility] OR;  Service: ENT;  Laterality: N/A;    Family History  Problem Relation Age of Onset  . Diabetes Maternal Grandmother   . Hypertension Maternal Grandmother   . Asthma Other   . Cancer Other   . Stroke Other   . Heart disease Other     Social History   Tobacco Use  . Smoking status: Never Smoker  . Smokeless tobacco: Never Used  Substance Use Topics  . Alcohol use: No    Subjective:   I connected with Dorna Leitz on 09/22/20 at 10:40 AM EST by a video enabled telemedicine application and verified that I am speaking with the correct person using two identifiers.   I discussed the limitations of evaluation and management by telemedicine and the availability of in person appointments. The patient expressed understanding and agreed to proceed. Provider in office/ patient is  at home; provider and patient are only 2 people on video call.   Cough/ congestion x 1 week; had PCR COVID test yesterday- negative; +sinus congestion/ pressure; productive cough; no fever; no chest pain or shortness of breath;  Taking OTC Dayquil/ Nyquil and Mucinex; Requesting refill on Tessalon Perles;     Objective:  There were no vitals filed for this visit.  General: Well developed, well nourished, in no acute distress  Skin : Warm and dry.  Head: Normocephalic and atraumatic  Lungs: Respirations unlabored;  Neurologic: Alert and oriented; speech intact; face symmetrical;   Assessment:  1. Acute sinusitis, recurrence not specified, unspecified location     Plan:  Rx for Doxycycline 100 mg bid x 10 days, Tessalon Perles 100 mg tid prn; increase fluids, rest and follow-up worse, no better.    No follow-ups on file.  No orders of the defined types were placed in this encounter.   Requested Prescriptions   Signed Prescriptions Disp Refills  . doxycycline (VIBRA-TABS) 100 MG tablet 20 tablet 0    Sig: Take 1 tablet (100 mg total) by mouth 2 (two) times  daily.  . benzonatate (TESSALON) 100 MG capsule 20 capsule 0    Sig: Take 1 capsule (100 mg total) by mouth 3 (three) times daily as needed.

## 2020-10-26 ENCOUNTER — Other Ambulatory Visit: Payer: Self-pay | Admitting: Acute Care

## 2020-10-26 ENCOUNTER — Telehealth: Payer: Self-pay | Admitting: Internal Medicine

## 2020-10-26 DIAGNOSIS — E118 Type 2 diabetes mellitus with unspecified complications: Secondary | ICD-10-CM

## 2020-10-26 MED ORDER — AZELASTINE HCL 0.1 % NA SOLN: 2.0000 | Freq: Two times a day (BID) | NASAL | 6 refills | Status: DC

## 2020-10-29 MED ORDER — GABAPENTIN 300 MG PO CAPS: 900.0000 mg | ORAL_CAPSULE | Freq: Three times a day (TID) | ORAL | 6 refills | Status: DC

## 2020-10-29 MED ORDER — ALLOPURINOL 300 MG PO TABS: 300.0000 mg | ORAL_TABLET | Freq: Every day | ORAL | 0 refills | Status: DC

## 2020-10-29 NOTE — Telephone Encounter (Signed)
Patient requesting short supply of medication until 4/7 appointment

## 2020-10-29 NOTE — Telephone Encounter (Signed)
Ok to send a short supply? Please advise

## 2020-10-29 NOTE — Telephone Encounter (Signed)
Is there some reason his meds would be cut off? He just had physical Dec 2021

## 2020-10-29 NOTE — Addendum Note (Signed)
Addended by: Manuela Schwartz on: 10/29/2020 11:32 AM   Modules accepted: Orders

## 2020-10-31 ENCOUNTER — Other Ambulatory Visit: Payer: Self-pay | Admitting: Internal Medicine

## 2020-11-04 ENCOUNTER — Ambulatory Visit: Payer: BLUE CROSS/BLUE SHIELD | Admitting: Internal Medicine

## 2020-11-08 ENCOUNTER — Other Ambulatory Visit: Payer: Self-pay

## 2020-11-08 ENCOUNTER — Encounter: Payer: Self-pay | Admitting: Internal Medicine

## 2020-11-08 ENCOUNTER — Other Ambulatory Visit: Payer: Self-pay | Admitting: Internal Medicine

## 2020-11-08 ENCOUNTER — Ambulatory Visit (INDEPENDENT_AMBULATORY_CARE_PROVIDER_SITE_OTHER): Payer: BLUE CROSS/BLUE SHIELD | Admitting: Internal Medicine

## 2020-11-08 VITALS — BP 130/90 | HR 84 | Temp 98.2°F | Resp 18 | Ht 70.0 in | Wt 314.2 lb

## 2020-11-08 DIAGNOSIS — I5032 Chronic diastolic (congestive) heart failure: Secondary | ICD-10-CM

## 2020-11-08 DIAGNOSIS — E118 Type 2 diabetes mellitus with unspecified complications: Secondary | ICD-10-CM | POA: Diagnosis not present

## 2020-11-08 DIAGNOSIS — J3089 Other allergic rhinitis: Secondary | ICD-10-CM | POA: Diagnosis not present

## 2020-11-08 DIAGNOSIS — N528 Other male erectile dysfunction: Secondary | ICD-10-CM

## 2020-11-08 LAB — HEMOGLOBIN A1C: Hgb A1c MFr Bld: 5.7 % (ref 4.6–6.5)

## 2020-11-08 LAB — CBC
HCT: 37.7 % — ABNORMAL LOW (ref 39.0–52.0)
Hemoglobin: 12.4 g/dL — ABNORMAL LOW (ref 13.0–17.0)
MCHC: 32.8 g/dL (ref 30.0–36.0)
MCV: 91.4 fl (ref 78.0–100.0)
Platelets: 196 10*3/uL (ref 150.0–400.0)
RBC: 4.12 Mil/uL — ABNORMAL LOW (ref 4.22–5.81)
RDW: 14.5 % (ref 11.5–15.5)
WBC: 7.8 10*3/uL (ref 4.0–10.5)

## 2020-11-08 LAB — COMPREHENSIVE METABOLIC PANEL
ALT: 14 U/L (ref 0–53)
AST: 15 U/L (ref 0–37)
Albumin: 3.9 g/dL (ref 3.5–5.2)
Alkaline Phosphatase: 89 U/L (ref 39–117)
BUN: 13 mg/dL (ref 6–23)
CO2: 36 mEq/L — ABNORMAL HIGH (ref 19–32)
Calcium: 9.1 mg/dL (ref 8.4–10.5)
Chloride: 100 mEq/L (ref 96–112)
Creatinine, Ser: 0.91 mg/dL (ref 0.40–1.50)
GFR: 106.97 mL/min (ref >60.00)
Glucose, Bld: 92 mg/dL (ref 70–99)
Potassium: 3.4 mEq/L — ABNORMAL LOW (ref 3.5–5.1)
Sodium: 141 mEq/L (ref 135–145)
Total Bilirubin: 0.8 mg/dL (ref 0.2–1.2)
Total Protein: 7.1 g/dL (ref 6.0–8.3)

## 2020-11-08 MED ORDER — NYSTATIN-TRIAMCINOLONE 100000-0.1 UNIT/GM-% EX OINT: 1.0000 "application " | TOPICAL_OINTMENT | Freq: Two times a day (BID) | CUTANEOUS | 3 refills | Status: DC

## 2020-11-08 MED ORDER — BENZONATATE 200 MG PO CAPS: 200.0000 mg | ORAL_CAPSULE | Freq: Three times a day (TID) | ORAL | 6 refills | Status: DC | PRN

## 2020-11-08 MED ORDER — VARDENAFIL HCL 10 MG PO TABS: 10.0000 mg | ORAL_TABLET | Freq: Every day | ORAL | 0 refills | Status: DC | PRN

## 2020-11-08 NOTE — Progress Notes (Signed)
   Subjective:   Patient ID: Derrick [name], male    DOB: 01-30-1981, 40 y.o.   MRN: [id number]  HPI The patient is a 40 YO man coming in for follow up neuropathy (leg swelling and pain, using gabapentin and some worsening with the weight loss, denies fevers or chills, denies skin color changes, denies worsening swelling and overall mild improvement since weight loss, seen vascular and they did not feel there was an intervenable area at this time) and ED (has tried viagra and cialis in the past without great results, was hoping that weight loss would help, some improvement but still problems) and congestion (taking mucinex and not sure if there is a better alternative, has drainage from sinuses, with his trach this causes congestion, is getting closer to his weight to consider evaluation of removing trach).   Review of Systems  Constitutional: Negative.   HENT: Positive for congestion.   Eyes: Negative.   Respiratory: Positive for cough. Negative for chest tightness and shortness of breath.   Cardiovascular: Positive for leg swelling. Negative for chest pain and palpitations.  Gastrointestinal: Negative for abdominal distention, abdominal pain, constipation, diarrhea, nausea and vomiting.  Musculoskeletal: Negative.   Skin: Negative.   Neurological: Positive for numbness.  Psychiatric/Behavioral: Negative.     Objective:  Physical Exam Constitutional:      Appearance: He is well-developed.  HENT:     Head: Normocephalic and atraumatic.  Cardiovascular:     Rate and Rhythm: Normal rate and regular rhythm.     Comments: Trach, no changes Pulmonary:     Effort: Pulmonary effort is normal. No respiratory distress.     Breath sounds: Normal breath sounds. No wheezing or rales.  Abdominal:     General: Bowel sounds are normal. There is no distension.     Palpations: Abdomen is soft.     Tenderness: There is no abdominal tenderness. There is no rebound.  Musculoskeletal:      Cervical back: Normal range of motion.     Right lower leg: Edema present.     Left lower leg: Edema present.     Comments: Stable edema  Skin:    General: Skin is warm and dry.  Neurological:     Mental Status: He is alert and oriented to person, place, and time.     Coordination: Coordination normal.     Vitals:   11/08/20 1411  BP: 130/90  Pulse: 84  Resp: 18  Temp: 98.2 F (36.8 C)  TempSrc: Oral  SpO2: 98%  Weight: (!) 314 lb 3.2 oz (142.5 kg)  Height: 5\' 10"  (1.778 m)    This visit occurred during the SARS-CoV-2 public health emergency.  Safety protocols were in place, including screening questions prior to the visit, additional usage of staff PPE, and extensive cleaning of exam room while observing appropriate contact time as indicated for disinfecting solutions.   Assessment & Plan:

## 2020-11-08 NOTE — Patient Instructions (Addendum)
We have sent in the cream to use on the rash to help prevent infection and help justify the surgery to remove the skin.  We have sent in the tessalon perles to use for coughing.  We will check the labs.

## 2020-11-09 ENCOUNTER — Other Ambulatory Visit: Payer: Self-pay | Admitting: Internal Medicine

## 2020-11-09 MED ORDER — TRIAMCINOLONE ACETONIDE 0.1 % EX CREA: 1.0000 "application " | TOPICAL_CREAM | Freq: Two times a day (BID) | CUTANEOUS | 5 refills | Status: DC

## 2020-11-09 MED ORDER — NYSTATIN 100000 UNIT/GM EX CREA: 1.0000 "application " | TOPICAL_CREAM | Freq: Two times a day (BID) | CUTANEOUS | 5 refills | Status: DC

## 2020-11-11 NOTE — Assessment & Plan Note (Signed)
Added singulair to see if we can reduce the drainage. Can continue mucinex also. Rx tessalon perles to help reduce coughing.

## 2020-11-11 NOTE — Assessment & Plan Note (Signed)
Checking HgA1c, has had marked improvement with weight loss.

## 2020-11-11 NOTE — Assessment & Plan Note (Signed)
No sign of flare. Checking CMP for potassium or renal function changes. Adjust lasix as needed.

## 2020-11-11 NOTE — Assessment & Plan Note (Signed)
Rx levitra to try.

## 2020-11-23 ENCOUNTER — Other Ambulatory Visit (HOSPITAL_COMMUNITY)
Admission: RE | Admit: 2020-11-23 | Discharge: 2020-11-23 | Disposition: A | Discharge status: Home / Self Care | Payer: BLUE CROSS/BLUE SHIELD | Source: Ambulatory Visit | Attending: Internal Medicine | Admitting: Internal Medicine

## 2020-11-23 DIAGNOSIS — Z20822 Contact with and (suspected) exposure to covid-19: Secondary | ICD-10-CM | POA: Diagnosis not present

## 2020-11-23 DIAGNOSIS — Z01812 Encounter for preprocedural laboratory examination: Secondary | ICD-10-CM | POA: Diagnosis present

## 2020-11-24 ENCOUNTER — Other Ambulatory Visit: Payer: Self-pay

## 2020-11-24 ENCOUNTER — Ambulatory Visit (HOSPITAL_COMMUNITY)
Admission: RE | Admit: 2020-11-24 | Discharge: 2020-11-24 | Disposition: A | Discharge status: Home / Self Care | Payer: BLUE CROSS/BLUE SHIELD | Source: Ambulatory Visit | Attending: Acute Care | Admitting: Acute Care

## 2020-11-24 ENCOUNTER — Other Ambulatory Visit: Payer: Self-pay | Admitting: Acute Care

## 2020-11-24 DIAGNOSIS — Z430 Encounter for attention to tracheostomy: Secondary | ICD-10-CM | POA: Diagnosis present

## 2020-11-24 DIAGNOSIS — E669 Obesity, unspecified: Secondary | ICD-10-CM | POA: Insufficient documentation

## 2020-11-24 DIAGNOSIS — G4733 Obstructive sleep apnea (adult) (pediatric): Secondary | ICD-10-CM | POA: Insufficient documentation

## 2020-11-24 DIAGNOSIS — Z9884 Bariatric surgery status: Secondary | ICD-10-CM | POA: Insufficient documentation

## 2020-11-24 DIAGNOSIS — Z79899 Other long term (current) drug therapy: Secondary | ICD-10-CM | POA: Insufficient documentation

## 2020-11-24 DIAGNOSIS — H9202 Otalgia, left ear: Secondary | ICD-10-CM | POA: Diagnosis not present

## 2020-11-24 DIAGNOSIS — Z930 Tracheostomy status: Secondary | ICD-10-CM

## 2020-11-24 DIAGNOSIS — I5020 Unspecified systolic (congestive) heart failure: Secondary | ICD-10-CM | POA: Insufficient documentation

## 2020-11-24 DIAGNOSIS — J309 Allergic rhinitis, unspecified: Secondary | ICD-10-CM | POA: Insufficient documentation

## 2020-11-24 LAB — SARS CORONAVIRUS 2 (TAT 6-24 HRS): SARS Coronavirus 2: NEGATIVE

## 2020-11-24 MED ORDER — AZELASTINE HCL 0.1 % NA SOLN: 2.0000 | Freq: Two times a day (BID) | NASAL | 12 refills | Status: DC

## 2020-11-24 NOTE — Progress Notes (Addendum)
Reason for visit Trach follow up   HPI  [name] presents today for planned tracheostomy change.  He continues to do well with his weight loss status post gastric bypass surgery.  His activity tolerance is improving daily, overall his general health continues to improve.  He is looking forward to potentially the next step in decannulation in the near future  ROS  Review of Systems  Constitutional: Negative.   HENT: Positive for congestion and ear pain.        He has had intermittent left ear pain.  It is not painful to touch.  He has not had hearing loss.  He notes is extremely sensitive to loud noises and primarily the left ear.  Has intermittent nasal congestion, nasal antihistamine completely resolves this  Eyes: Negative.   Respiratory: Negative.   Cardiovascular: Negative.   Gastrointestinal: Negative.   Genitourinary: Negative.   Musculoskeletal: Negative.   Skin: Negative.   Neurological: Negative.   Endo/Heme/Allergies: Negative.   Psychiatric/Behavioral: Negative.     Exam  General: 40 year old male patient who is ambulatory, he is in no acute distress presents today for planned visit HEENT normocephalic atraumatic size 6 Bivona tracheostomy site is clean, excellent phonation quality with Trach Pulmonary: Clear to auscultation Cardiac: Regular rate and rhythm Abdomen: Soft nontender Extremities: Warm dry Neuro: Intact. Procedure  The existing Bivona tracheostomy was removed, tracheostomy stoma prepped with lidocaine jelly, and new size 6 Bivona tracheostomy replaced without difficulty end-tidal CO2 confirmed placement   Impression/plan   Trach dependence HFrEF Severe OSA Obesity s/p gastric bypass Left ear pain Allergic rhinitis   Discussion [name] continues to do well.  He has proven he no longer needs tracheostomy for airway purposes but probably still needs it or at least CPAP to treat his sleep apnea.  I think given his 140 pound weight loss he would  tolerate CPAP therapy much more readily than he had in the past His only complaint to me really is some vague left ear pain that occurs primarily with loud noises, it comes on spontaneously and really resolved spontaneously as well.  He has had no drainage, no congestion, it is not painful to palpation, he does not have hearing loss.  I am not sure what this is related to  Plan ROV 8 weeks for change Message sent to [facility] to see Sood. Need to decide about timing of repeat PSG to see if CPAP still needed and what setting prior to decannulation Cont Astelin nasal ENT referral for left ear pain   My time 15 minutes  [name] ACNP-BC [facility] Pulmonary/Critical Care Pager # [phone] OR # [phone] if no answer

## 2020-11-24 NOTE — Progress Notes (Addendum)
Tracheostomy Procedure Note  ODIES DESA [id number] 1980-10-12  Pre Procedure Tracheostomy Information  Trach Brand: Portex Bivona 60A160 Size: 6.0 Style: Uncuffed Secured by: Velcro   Procedure: Trach change and trach cleaning    Post Procedure Tracheostomy Information  Trach Brand: Portex Bivona  60A160 Size: 6.0 Style: Uncuffed Secured by: Velcro   Post Procedure Evaluation:  ETCO2 positive color change from yellow to purple : Yes.   Vital signs:VSS Patients current condition: stable Complications: No apparent complications Trach site exam: clean and dry Wound care done: 4x4 drain gauze Patient did tolerate procedure well. *Lidocaine jelly to site prior to trach change*  Education: none  Prescription needs: none    Additional needs: Patient given 1 new and 4 additional red caps and and new PMV valve

## 2020-11-25 ENCOUNTER — Telehealth: Payer: Self-pay

## 2020-11-25 DIAGNOSIS — H9202 Otalgia, left ear: Secondary | ICD-10-CM

## 2020-11-25 NOTE — Telephone Encounter (Signed)
-----   Message from [name], NP sent at 11/24/2020  1:50 PM EDT ----- Regarding: referral and follow up request Two requests.   1) can we please get [name] set up to see Dr Craige Cotta again in his next available opening (doesn't have to be urgent). Indication: re-evaluate sleep apnea s/p weight loss and to help assist w/ planning trach decannulation.   2) please place a referral to ENT Ginette Otto) for left Ear pain of unclear origin.   Thanks! Cindee Lame

## 2020-11-25 NOTE — Telephone Encounter (Signed)
Called patient to get him scheduled to see Dr. Craige Cotta. He did not answer. Left a VM for him to call back.   Will go ahead and place the ENT referral.

## 2021-02-14 ENCOUNTER — Other Ambulatory Visit: Payer: Self-pay | Admitting: Internal Medicine

## 2021-02-18 ENCOUNTER — Other Ambulatory Visit: Payer: Self-pay | Admitting: Internal Medicine

## 2021-04-13 ENCOUNTER — Ambulatory Visit (INDEPENDENT_AMBULATORY_CARE_PROVIDER_SITE_OTHER): Payer: BLUE CROSS/BLUE SHIELD | Admitting: Internal Medicine

## 2021-04-13 ENCOUNTER — Other Ambulatory Visit: Payer: Self-pay

## 2021-04-13 ENCOUNTER — Encounter: Payer: Self-pay | Admitting: Internal Medicine

## 2021-04-13 VITALS — BP 130/80 | HR 61 | Temp 97.9°F | Resp 18 | Ht 70.0 in | Wt 319.2 lb

## 2021-04-13 DIAGNOSIS — J069 Acute upper respiratory infection, unspecified: Secondary | ICD-10-CM

## 2021-04-13 DIAGNOSIS — R21 Rash and other nonspecific skin eruption: Secondary | ICD-10-CM | POA: Insufficient documentation

## 2021-04-13 DIAGNOSIS — Z9884 Bariatric surgery status: Secondary | ICD-10-CM

## 2021-04-13 DIAGNOSIS — E118 Type 2 diabetes mellitus with unspecified complications: Secondary | ICD-10-CM | POA: Diagnosis not present

## 2021-04-13 DIAGNOSIS — M7989 Other specified soft tissue disorders: Secondary | ICD-10-CM

## 2021-04-13 LAB — CBC
HCT: 33.7 % — ABNORMAL LOW (ref 39.0–52.0)
Hemoglobin: 11.2 g/dL — ABNORMAL LOW (ref 13.0–17.0)
MCHC: 33.2 g/dL (ref 30.0–36.0)
MCV: 90.9 fl (ref 78.0–100.0)
Platelets: 137 10*3/uL — ABNORMAL LOW (ref 150.0–400.0)
RBC: 3.71 Mil/uL — ABNORMAL LOW (ref 4.22–5.81)
RDW: 13.9 % (ref 11.5–15.5)
WBC: 5.4 10*3/uL (ref 4.0–10.5)

## 2021-04-13 LAB — COMPREHENSIVE METABOLIC PANEL
ALT: 13 U/L (ref 0–53)
AST: 13 U/L (ref 0–37)
Albumin: 3.6 g/dL (ref 3.5–5.2)
Alkaline Phosphatase: 79 U/L (ref 39–117)
BUN: 13 mg/dL (ref 6–23)
CO2: 29 mEq/L (ref 19–32)
Calcium: 9 mg/dL (ref 8.4–10.5)
Chloride: 106 mEq/L (ref 96–112)
Creatinine, Ser: 0.88 mg/dL (ref 0.40–1.50)
GFR: 108.81 mL/min (ref >60.00)
Glucose, Bld: 83 mg/dL (ref 70–99)
Potassium: 3.9 mEq/L (ref 3.5–5.1)
Sodium: 141 mEq/L (ref 135–145)
Total Bilirubin: 0.6 mg/dL (ref 0.2–1.2)
Total Protein: 6.3 g/dL (ref 6.0–8.3)

## 2021-04-13 LAB — HEMOGLOBIN A1C: Hgb A1c MFr Bld: 5.4 % (ref 4.6–6.5)

## 2021-04-13 LAB — BRAIN NATRIURETIC PEPTIDE: Pro B Natriuretic peptide (BNP): 112 pg/mL — ABNORMAL HIGH (ref 0.0–100.0)

## 2021-04-13 LAB — LIPID PANEL
Cholesterol: 126 mg/dL (ref 0–200)
HDL: 51.2 mg/dL (ref >39.00)
LDL Cholesterol: 66 mg/dL (ref 0–99)
NonHDL: 74.82
Total CHOL/HDL Ratio: 2
Triglycerides: 43 mg/dL (ref 0.0–149.0)
VLDL: 8.6 mg/dL (ref 0.0–40.0)

## 2021-04-13 LAB — VITAMIN D 25 HYDROXY (VIT D DEFICIENCY, FRACTURES): VITD: 21.19 ng/mL — ABNORMAL LOW (ref 30.00–100.00)

## 2021-04-13 LAB — VITAMIN B12: Vitamin B-12: 144 pg/mL — ABNORMAL LOW (ref 211–911)

## 2021-04-13 MED ORDER — LEVOCETIRIZINE DIHYDROCHLORIDE 5 MG PO TABS: 5.0000 mg | ORAL_TABLET | Freq: Every evening | ORAL | 6 refills | Status: DC

## 2021-04-13 MED ORDER — FLUTICASONE PROPIONATE 50 MCG/ACT NA SUSP: 1.0000 | Freq: Every day | NASAL | 11 refills | Status: DC

## 2021-04-13 MED ORDER — BENZONATATE 200 MG PO CAPS: 200.0000 mg | ORAL_CAPSULE | Freq: Three times a day (TID) | ORAL | 6 refills | Status: DC | PRN

## 2021-04-13 MED ORDER — NYSTATIN-TRIAMCINOLONE 100000-0.1 UNIT/GM-% EX CREA: 1.0000 "application " | TOPICAL_CREAM | Freq: Two times a day (BID) | CUTANEOUS | 11 refills | Status: DC

## 2021-04-13 NOTE — Assessment & Plan Note (Signed)
Checking HgA1c. Is now controlled with weight loss surgery. He is not taking medications for sugars currently. Not on ACE-I or ARB or statin currently.

## 2021-04-13 NOTE — Assessment & Plan Note (Signed)
Rx nystatin/triamcinolone for rash on the chest wall. This is aggravated by the excess skin due to weight loss s/p bypass surgery.

## 2021-04-13 NOTE — Progress Notes (Signed)
   Subjective:   Patient ID: Derrick [name], male    DOB: Jun 16, 1981, 40 y.o.   MRN: [id number]  HPI The patient is a 40 YO man coming in for concerns about weight increase as well as follow up medical conditions.  Review of Systems  Constitutional: Negative.   HENT: Negative.    Eyes: Negative.   Respiratory:  Positive for cough. Negative for chest tightness and shortness of breath.        Chronic and stable  Cardiovascular:  Negative for chest pain, palpitations and leg swelling.  Gastrointestinal:  Negative for abdominal distention, abdominal pain, constipation, diarrhea, nausea and vomiting.  Musculoskeletal: Negative.   Skin: Negative.   Neurological: Negative.   Psychiatric/Behavioral: Negative.     Objective:  Physical Exam Constitutional:      Appearance: He is well-developed. He is obese.  HENT:     Head: Normocephalic and atraumatic.  Cardiovascular:     Rate and Rhythm: Normal rate and regular rhythm.     Comments: Rash associated with excess skin from weight loss on the chest area Pulmonary:     Effort: Pulmonary effort is normal. No respiratory distress.     Breath sounds: Normal breath sounds. No wheezing or rales.  Abdominal:     General: Bowel sounds are normal. There is no distension.     Palpations: Abdomen is soft.     Tenderness: There is no abdominal tenderness. There is no rebound.  Musculoskeletal:     Cervical back: Normal range of motion.     Right lower leg: Edema present.     Left lower leg: Edema present.     Comments: Lymphedema on the posterior calf which is rubbing against surrounding skin  Skin:    General: Skin is warm and dry.  Neurological:     Mental Status: He is alert and oriented to person, place, and time.     Coordination: Coordination normal.    Vitals:   04/13/21 0848  BP: 130/80  Pulse: 61  Resp: 18  Temp: 97.9 F (36.6 C)  TempSrc: Oral  SpO2: 98%  Weight: (!) 319 lb 3.2 oz (144.8 kg)  Height: 5\' 10"  (1.778  m)    This visit occurred during the SARS-CoV-2 public health emergency.  Safety protocols were in place, including screening questions prior to the visit, additional usage of staff PPE, and extensive cleaning of exam room while observing appropriate contact time as indicated for disinfecting solutions.   Assessment & Plan:

## 2021-04-13 NOTE — Assessment & Plan Note (Signed)
Checking BNP although swelling looks stable from baseline.

## 2021-04-13 NOTE — Patient Instructions (Signed)
We will check the labs today and will have you take a lasix (fluid pill) today and 1 pill tomorrow to see if you have some extra fluid.

## 2021-04-13 NOTE — Assessment & Plan Note (Signed)
Has been still losing weight s/p bypass surgery and had sudden weight jump of about 10 pounds in the last 3-4 days. Checking BNP for concerns of fluid retention.

## 2021-04-14 ENCOUNTER — Encounter: Payer: Self-pay | Admitting: Internal Medicine

## 2021-04-18 ENCOUNTER — Ambulatory Visit: Payer: BLUE CROSS/BLUE SHIELD

## 2021-04-21 ENCOUNTER — Ambulatory Visit: Payer: BLUE CROSS/BLUE SHIELD

## 2021-04-22 ENCOUNTER — Ambulatory Visit (INDEPENDENT_AMBULATORY_CARE_PROVIDER_SITE_OTHER): Payer: BLUE CROSS/BLUE SHIELD

## 2021-04-22 ENCOUNTER — Other Ambulatory Visit: Payer: Self-pay

## 2021-04-22 DIAGNOSIS — E538 Deficiency of other specified B group vitamins: Secondary | ICD-10-CM | POA: Diagnosis not present

## 2021-04-22 MED ORDER — CYANOCOBALAMIN 1000 MCG/ML IJ SOLN
1000.0000 ug | Freq: Once | INTRAMUSCULAR | Status: AC
  Administered 2021-04-22: 1000 ug via INTRAMUSCULAR

## 2021-04-22 NOTE — Progress Notes (Signed)
Pt given B12 injection w/o any complications. 

## 2021-05-06 ENCOUNTER — Ambulatory Visit: Payer: BLUE CROSS/BLUE SHIELD

## 2021-05-20 ENCOUNTER — Ambulatory Visit: Payer: BLUE CROSS/BLUE SHIELD

## 2021-05-23 ENCOUNTER — Ambulatory Visit: Payer: BLUE CROSS/BLUE SHIELD

## 2021-05-27 ENCOUNTER — Ambulatory Visit (INDEPENDENT_AMBULATORY_CARE_PROVIDER_SITE_OTHER): Payer: BLUE CROSS/BLUE SHIELD

## 2021-05-27 ENCOUNTER — Other Ambulatory Visit: Payer: Self-pay

## 2021-05-27 DIAGNOSIS — E538 Deficiency of other specified B group vitamins: Secondary | ICD-10-CM

## 2021-05-27 MED ORDER — CYANOCOBALAMIN 1000 MCG/ML IJ SOLN
1000.0000 ug | Freq: Once | INTRAMUSCULAR | Status: AC
  Administered 2021-05-27: 1000 ug via INTRAMUSCULAR

## 2021-05-27 NOTE — Progress Notes (Signed)
Pt given 3rd B12 injection w/o any complications.

## 2021-06-03 ENCOUNTER — Other Ambulatory Visit: Payer: Self-pay

## 2021-06-03 ENCOUNTER — Ambulatory Visit (INDEPENDENT_AMBULATORY_CARE_PROVIDER_SITE_OTHER): Payer: BLUE CROSS/BLUE SHIELD

## 2021-06-03 DIAGNOSIS — E538 Deficiency of other specified B group vitamins: Secondary | ICD-10-CM

## 2021-06-03 MED ORDER — CYANOCOBALAMIN 1000 MCG/ML IJ SOLN
1000.0000 ug | Freq: Once | INTRAMUSCULAR | Status: AC
  Administered 2021-06-03: 1000 ug via INTRAMUSCULAR

## 2021-06-03 NOTE — Progress Notes (Signed)
Pt was given B12 w/o any complications. 

## 2021-06-10 ENCOUNTER — Ambulatory Visit: Payer: BLUE CROSS/BLUE SHIELD

## 2021-06-17 ENCOUNTER — Ambulatory Visit (INDEPENDENT_AMBULATORY_CARE_PROVIDER_SITE_OTHER): Payer: BLUE CROSS/BLUE SHIELD

## 2021-06-17 ENCOUNTER — Other Ambulatory Visit: Payer: Self-pay

## 2021-06-17 DIAGNOSIS — E538 Deficiency of other specified B group vitamins: Secondary | ICD-10-CM | POA: Diagnosis not present

## 2021-06-17 MED ORDER — CYANOCOBALAMIN 1000 MCG/ML IJ SOLN
1000.0000 ug | Freq: Once | INTRAMUSCULAR | Status: AC
  Administered 2021-06-17: 1000 ug via INTRAMUSCULAR

## 2021-06-17 NOTE — Progress Notes (Signed)
Pt given B12 w/o any complications. °

## 2021-06-24 ENCOUNTER — Other Ambulatory Visit: Payer: Self-pay | Admitting: Internal Medicine

## 2021-07-06 ENCOUNTER — Inpatient Hospital Stay (HOSPITAL_COMMUNITY): Admission: RE | Admit: 2021-07-06 | Payer: BLUE CROSS/BLUE SHIELD | Source: Ambulatory Visit

## 2021-07-13 ENCOUNTER — Inpatient Hospital Stay (HOSPITAL_COMMUNITY): Admission: RE | Admit: 2021-07-13 | Payer: BLUE CROSS/BLUE SHIELD | Source: Ambulatory Visit

## 2021-07-18 ENCOUNTER — Inpatient Hospital Stay (HOSPITAL_COMMUNITY): Admission: RE | Admit: 2021-07-18 | Payer: BLUE CROSS/BLUE SHIELD | Source: Ambulatory Visit

## 2021-07-19 ENCOUNTER — Ambulatory Visit: Payer: BLUE CROSS/BLUE SHIELD | Admitting: Internal Medicine

## 2021-07-26 ENCOUNTER — Other Ambulatory Visit: Payer: Self-pay | Admitting: Acute Care

## 2021-07-28 ENCOUNTER — Ambulatory Visit (HOSPITAL_COMMUNITY)
Admission: RE | Admit: 2021-07-28 | Discharge: 2021-07-28 | Disposition: A | Discharge status: Home / Self Care | Payer: BLUE CROSS/BLUE SHIELD | Source: Ambulatory Visit | Attending: Acute Care | Admitting: Acute Care

## 2021-07-28 ENCOUNTER — Other Ambulatory Visit: Payer: Self-pay

## 2021-07-28 DIAGNOSIS — Z430 Encounter for attention to tracheostomy: Secondary | ICD-10-CM | POA: Diagnosis present

## 2021-07-28 DIAGNOSIS — Z930 Tracheostomy status: Secondary | ICD-10-CM

## 2021-07-28 LAB — SARS CORONAVIRUS 2 (TAT 6-24 HRS): SARS Coronavirus 2: NEGATIVE

## 2021-07-28 NOTE — Progress Notes (Signed)
Tracheostomy Procedure Note  Derrick [name] [id number] 1980-09-10  Pre Procedure Tracheostomy Information  Trach Brand: Portex Bivona Size:  6.0   [id number] Style: Uncuffed Secured by: Velcro   Procedure: Trach Cleaning and Trach Change    Post Procedure Tracheostomy Information  Trach Brand: Portex Size:  6.0  60A160 Style: Uncuffed Secured by: Velcro   Post Procedure Evaluation:  ETCO2 positive color change from yellow to purple : Yes.   Vital signs:blood pressure VSS Patients current condition: stable Complications: No apparent complications Trach site exam: clean, dry Wound care done: 4 x 4 gauze drain Patient did tolerate procedure well.   Education: none  Prescription needs: none    Additional needs:  Patient given red caps x 5  also changedd PMV and an additional PMV give to patient at this visit

## 2021-07-28 NOTE — Progress Notes (Signed)
[place] [facility]   Reason for visit  Planned trach change  HPI Derrick [name] is well known to me. I have followed him for several years now for trach management in setting of severe OSA. He is now almost one year s/p gastric sleeve surgery. He has really done remarkably. When I first met Derrick [name] he was over 400 lbs. He is now in 290 range. His mobility and activity tolerance has improved dramatically. His ultimate plan is to get his trach out.  He presents today for planned trach change   Review of Systems  Constitutional: Negative.   HENT: Negative.    Eyes: Negative.   Respiratory: Negative.    Cardiovascular: Negative.   Gastrointestinal: Negative.   Genitourinary: Negative.   Musculoskeletal: Negative.   Skin: Negative.   Neurological: Negative.   Endo/Heme/Allergies: Negative.   Psychiatric/Behavioral: Negative.     Exam Pulse ox on room air mid 90s  General 40 year old ambulatory male  walked into trach clinic. No distress  HENT NCAT trach stoma unremarkable. Excellent phonation.  Pulm CTA, no accessory use on room air Card rrr Abd soft  Ext warm and dry  Neuro intact  Procedure The  size 6 portex was removed. The trach stoma saw evaluated. It was unremarkable. A new size 6 Portex was placed w/out difficulty. Pacement verified via ETCO2. Pt tolerated well.   Impression/plan Trach dependence in setting of severe OSA Chronic diastolic HF HTN Type II dm   Discussion  Derrick [name] is doing well from trach stand-point. Eventually our hope is to get him decannulated but suspect that will need more weight loss for this to be successful if he can't tolerate CPAP (which he feels like he cannot)   Plan Cont routine trach care Have scheduled him to see Dr Halford Chessman in few weeks ROV 12 weeks   Erick Colace ACNP-BC [place] Pager # [phone] OR # [phone] if no answer

## 2021-09-06 ENCOUNTER — Other Ambulatory Visit: Payer: Self-pay | Admitting: Internal Medicine

## 2021-09-07 ENCOUNTER — Encounter: Payer: Self-pay | Admitting: Pulmonary Disease

## 2021-09-07 ENCOUNTER — Other Ambulatory Visit: Payer: Self-pay

## 2021-09-07 ENCOUNTER — Ambulatory Visit (INDEPENDENT_AMBULATORY_CARE_PROVIDER_SITE_OTHER): Payer: Self-pay | Admitting: Pulmonary Disease

## 2021-09-07 VITALS — BP 128/82 | HR 62 | Ht 70.0 in | Wt 301.6 lb

## 2021-09-07 DIAGNOSIS — G4733 Obstructive sleep apnea (adult) (pediatric): Secondary | ICD-10-CM

## 2021-09-07 NOTE — Progress Notes (Signed)
[facility]  Chief Complaint  Patient presents with   Follow-up    F/U on OSA. States he is no longer using the bipap due to not being able to tolerate the mask.     Past Surgical History:  He  has a past surgical history that includes Tracheostomy tube placement (N/A, 06/21/2015).  Past Medical History:  Diastolic CHF, DM, GERD, HTN, Allergies, Gout, Asthma, Neuropathy  Constitutional:  BP 128/82    Pulse 62    Ht $R'5\' 10"'io$  (1.778 m)    Wt (!) 301 lb 9.6 oz (136.8 kg)    SpO2 99% Comment: on RA   BMI 43.28 kg/m   Brief Summary:  Derrick [name] is a 41 y.o. male with obstructive sleep apnea and obesity hypoventilation syndrome.      Subjective:   Since I last saw him he had gastric sleeve in [place].  He is down about 150 lbs from his highest weight.  His goal is to get down to 225 lbs.  He is sleeping better and energy level better.  His goal is to have tracheostomy removed before this Summer if possible.  Physical Exam:   Appearance - well kempt   ENMT - no sinus tenderness, no oral exudate, no LAN, Mallampati 3 airway, no stridor, tracheostomy site clean  Respiratory - equal breath sounds bilaterally, no wheezing or rales  CV - s1s2 regular rate and rhythm, no murmurs  Ext - no clubbing, no edema  Skin - no rashes  Psych - normal mood and affect   Sleep Tests:  PSG 09/09/18 >> AHI 123.8, SpO2 low 65%.  CPAP 18 cm H2O with 1 liter oxygen >> AHI 11.8.  Trach capped during study.  Cardiac Tests:  Echo 06/14/15 >> EF 55 to 60%, grade 2 DD  Social History:  He  reports that he has never smoked. He has never used smokeless tobacco. He reports that he does not drink alcohol and does not use drugs.  Family History:  His family history includes Asthma in an other family member; Cancer in an other family member; Diabetes in his maternal grandmother; Heart disease in an other family member; Hypertension in his maternal  grandmother; Stroke in an other family member.     Assessment/Plan:   Obstructive sleep apnea. - will arrange for in lab split night sleep study with tracheostomy capped to determine if he can transition to CPAP/Bipap and then eventually have tracheostomy removed  Obesity hypoventilation syndrome. - oxygen requirements have improved with weight loss - will determine whether he needs to continue with supplemental oxygen at night after he completes his sleep study  Tracheostomy status. - followed by Marni Griffon with [facility]  Obesity. - s/p gastric sleeve in 2021 - followed by Legrand Pitts with [facility]  Time Spent Involved in Patient Care on Day of Examination:  27 minutes  Follow up:   Patient Instructions  Will arrange for in lab sleep study Will call to arrange for follow up after sleep study reviewed   Medication List:   Allergies as of 09/07/2021       Reactions   Vancomycin Other (See Comments)   Affected kidneys    Shrimp [shellfish Allergy]    Zosyn [piperacillin Sod-tazobactam So] Other (See Comments)        Medication List        Accurate as of September 07, 2021 11:54 AM. If you have any questions,  ask your nurse or doctor.          acetaminophen 325 MG tablet Commonly known as: TYLENOL Take 2 tablets (650 mg total) by mouth every 6 (six) hours as needed for mild pain or headache (fever >/= 101).   albuterol 108 (90 Base) MCG/ACT inhaler Commonly known as: VENTOLIN HFA Inhale 2 puffs into the lungs every 6 (six) hours as needed for wheezing or shortness of breath.   allopurinol 300 MG tablet Commonly known as: ZYLOPRIM Take 1 tablet by mouth once daily   azelastine 0.1 % nasal spray Commonly known as: ASTELIN Place 2 sprays into both nostrils 2 (two) times daily. Use in each nostril as directed   benzonatate 200 MG capsule Commonly known as: TESSALON Take 1 capsule (200 mg total) by mouth 3 (three) times daily  as needed.   clindamycin 1 % gel Commonly known as: CLINDAGEL Apply 1 application topically daily.   dextromethorphan-guaiFENesin 30-600 MG 12hr tablet Commonly known as: MUCINEX DM Take 1 tablet by mouth 2 (two) times daily.   fluticasone 110 MCG/ACT inhaler Commonly known as: Flovent HFA Inhale 2 puffs into the lungs 2 (two) times daily. What changed:  when to take this reasons to take this   fluticasone 50 MCG/ACT nasal spray Commonly known as: FLONASE Place 1 spray into both nostrils daily.   furosemide 80 MG tablet Commonly known as: LASIX Take 2 tablets (160 mg total) by mouth 3 (three) times daily as needed.   gabapentin 300 MG capsule Commonly known as: NEURONTIN Take 3 capsules (900 mg total) by mouth 3 (three) times daily.   HYDROcodone-acetaminophen 5-325 MG tablet Commonly known as: NORCO/VICODIN Take 1 tablet by mouth every 8 (eight) hours as needed for moderate pain.   levocetirizine 5 MG tablet Commonly known as: XYZAL Take 1 tablet (5 mg total) by mouth every evening.   metoCLOPramide 5 MG/5ML solution Commonly known as: REGLAN Take 10 mg by mouth 4 (four) times daily.   metoprolol tartrate 50 MG tablet Commonly known as: LOPRESSOR Take 1 tablet by mouth twice daily   montelukast 10 MG tablet Commonly known as: SINGULAIR Take 1 tablet (10 mg total) by mouth at bedtime.   nystatin cream Commonly known as: MYCOSTATIN Apply 1 application topically 2 (two) times daily.   nystatin-triamcinolone cream Commonly known as: MYCOLOG II Apply 1 application topically 2 (two) times daily.   omeprazole 40 MG capsule Commonly known as: PRILOSEC Take 40 mg by mouth daily.   ondansetron 4 MG tablet Commonly known as: Zofran Take 1 tablet (4 mg total) by mouth every 8 (eight) hours as needed for nausea or vomiting.   OneTouch Delica Lancets 47Q Misc Use as needed daily   potassium chloride 20 MEQ packet Commonly known as: KLOR-CON Take 20 mEq by  mouth daily.   sildenafil 100 MG tablet Commonly known as: VIAGRA TAKE 1/2 TO 1 (ONE-HALF TO ONE) TABLET BY MOUTH ONCE DAILY AS NEEDED FOR ERECTILE DYSFUNCTION   Tracheostomy Care Kit 30 kits by Does not apply route daily.   triamcinolone cream 0.1 % Commonly known as: KENALOG Apply 1 application topically 2 (two) times daily.   ursodiol 300 MG capsule Commonly known as: ACTIGALL Take 300 mg by mouth 2 (two) times daily.   vardenafil 10 MG tablet Commonly known as: Levitra Take 1 tablet (10 mg total) by mouth daily as needed for erectile dysfunction.        Signature:  Chesley Mires, MD [place] Pager - [phone] 09/07/2021, 11:54 AM

## 2021-09-07 NOTE — Patient Instructions (Signed)
Will arrange for in lab sleep study Will call to arrange for follow up after sleep study reviewed  

## 2021-09-22 ENCOUNTER — Encounter: Payer: Self-pay | Admitting: Internal Medicine

## 2021-09-22 ENCOUNTER — Telehealth: Payer: Self-pay | Admitting: Internal Medicine

## 2021-09-22 NOTE — Telephone Encounter (Signed)
Pt states he has had a constant productive cough w/ wheezing x2d, pt requesting a rx for benzonatate (TESSALON) 200 MG capsule   Offered pt an ov for tomorrow w/ another provider, pt declined due to scheduling conflict

## 2021-09-22 NOTE — Progress Notes (Signed)
Subjective:    Patient ID: Derrick [name], male    DOB: 06-Oct-1980, 41 y.o.   MRN: [id number]  This visit occurred during the SARS-CoV-2 public health emergency.  Safety protocols were in place, including screening questions prior to the visit, additional usage of staff PPE, and extensive cleaning of exam room while observing appropriate contact time as indicated for disinfecting solutions.    HPI The patient is here for an acute visit for cold symptoms.  His symptoms started three days ago.  His son has it and he he had been neg for covid, flu and strep - it is his asthma.  He states nasal congestion, sinus pressure, dry cough, wheezing, headaches.  He denies fever, ST, SOB.  He feels a little better today than yesterday.     He is taking mucinex, sudafed, dayquil, nyquil  Medications and allergies reviewed with patient and updated if appropriate.  Patient Active Problem List   Diagnosis Date Noted   Rash 04/13/2021   Left leg swelling 11/24/2019   Chronic respiratory failure with hypoxia (HCC) 06/17/2019   Chronic pain syndrome 06/17/2019   Vitamin D deficiency 11/12/2017   Tinnitus 09/27/2017   Numbness and tingling of hand 11/30/2016   Tracheostomy dependent ([place])    Routine general medical examination at a health care facility 09/21/2016   Acne 09/21/2016   ED (erectile dysfunction) 06/02/2016   Tracheostomy status ([place])    Other allergic rhinitis    Diabetes mellitus type 2 with complications ([place]) 85/27/7824   Dysphagia    Pulmonary hypertension ([place])    Morbid obesity ([place]) 06/13/2015   Essential hypertension 06/13/2015   OSA (obstructive sleep apnea) 06/13/2015   Obesity hypoventilation syndrome ([place]) 06/13/2015   Chronic diastolic heart failure ([place]) 06/13/2015    Current Outpatient Medications on File Prior to Visit  Medication Sig Dispense Refill   acetaminophen (TYLENOL) 325 MG tablet Take 2 tablets (650 mg total) by mouth every 6 (six) hours as  needed for mild pain or headache (fever >/= 101).     albuterol (PROVENTIL HFA;VENTOLIN HFA) 108 (90 Base) MCG/ACT inhaler Inhale 2 puffs into the lungs every 6 (six) hours as needed for wheezing or shortness of breath. 1 Inhaler 0   allopurinol (ZYLOPRIM) 300 MG tablet Take 1 tablet by mouth once daily 90 tablet 1   azelastine (ASTELIN) 0.1 % nasal spray Place 2 sprays into both nostrils 2 (two) times daily. Use in each nostril as directed 30 mL 12   benzonatate (TESSALON) 200 MG capsule Take 1 capsule (200 mg total) by mouth 3 (three) times daily as needed. 90 capsule 6   clindamycin (CLINDAGEL) 1 % gel Apply 1 application topically daily.     dextromethorphan-guaiFENesin (MUCINEX DM) 30-600 MG 12hr tablet Take 1 tablet by mouth 2 (two) times daily. 60 tablet 6   fluticasone (FLONASE) 50 MCG/ACT nasal spray Place 1 spray into both nostrils daily. 16 g 11   fluticasone (FLOVENT HFA) 110 MCG/ACT inhaler Inhale 2 puffs into the lungs 2 (two) times daily. (Patient taking differently: Inhale 2 puffs into the lungs 2 (two) times daily as needed (shortness of breath).) 1 Inhaler 2   furosemide (LASIX) 80 MG tablet Take 2 tablets (160 mg total) by mouth 3 (three) times daily as needed. 180 tablet 11   gabapentin (NEURONTIN) 300 MG capsule Take 3 capsules (900 mg total) by mouth 3 (three) times daily. 270 capsule 6   HYDROcodone-acetaminophen (NORCO/VICODIN) 5-325 MG tablet Take 1 tablet  by mouth every 8 (eight) hours as needed for moderate pain. 15 tablet 0   levocetirizine (XYZAL) 5 MG tablet Take 1 tablet (5 mg total) by mouth every evening. 30 tablet 6   metoCLOPramide (REGLAN) 5 MG/5ML solution Take 10 mg by mouth 4 (four) times daily.     metoprolol tartrate (LOPRESSOR) 50 MG tablet Take 1 tablet by mouth twice daily 180 tablet 0   montelukast (SINGULAIR) 10 MG tablet Take 1 tablet (10 mg total) by mouth at bedtime. 90 tablet 3   nystatin cream (MYCOSTATIN) Apply 1 application topically 2 (two) times  daily. 100 g 5   nystatin-triamcinolone (MYCOLOG II) cream Apply 1 application topically 2 (two) times daily. 120 g 11   omeprazole (PRILOSEC) 40 MG capsule Take 40 mg by mouth daily.     ondansetron (ZOFRAN) 4 MG tablet Take 1 tablet (4 mg total) by mouth every 8 (eight) hours as needed for nausea or vomiting. 20 tablet 0   ONETOUCH DELICA LANCETS 30Q MISC Use as needed daily 100 each 11   potassium chloride (KLOR-CON) 20 MEQ packet Take 20 mEq by mouth daily. 100 packet 11   sildenafil (VIAGRA) 100 MG tablet TAKE 1/2 TO 1 (ONE-HALF TO ONE) TABLET BY MOUTH ONCE DAILY AS NEEDED FOR ERECTILE DYSFUNCTION 4 tablet 0   Tracheostomy Care KIT 30 kits by Does not apply route daily. 30 each 6   triamcinolone cream (KENALOG) 0.1 % Apply 1 application topically 2 (two) times daily. 100 g 5   ursodiol (ACTIGALL) 300 MG capsule Take 300 mg by mouth 2 (two) times daily.     vardenafil (LEVITRA) 10 MG tablet Take 1 tablet (10 mg total) by mouth daily as needed for erectile dysfunction. 10 tablet 0   No current facility-administered medications on file prior to visit.    Past Medical History:  Diagnosis Date   Acute on chronic diastolic CHF (congestive heart failure), NYHA class 1 (HCC)    Diabetes mellitus without complication (HCC)    GERD (gastroesophageal reflux disease)    Hypertension    Hypoxemia    Morbid obesity (HCC)    Obesity hypoventilation syndrome (HCC)    OSA (obstructive sleep apnea)    Reflux     Past Surgical History:  Procedure Laterality Date   TRACHEOSTOMY TUBE PLACEMENT N/A 06/21/2015   Procedure: TRACHEOSTOMY;  Surgeon: Leta Baptist, MD;  Location: [facility] OR;  Service: ENT;  Laterality: N/A;    Social History   Socioeconomic History   Marital status: Single    Spouse name: Not on file   Number of children: Not on file   Years of education: Not on file   Highest education level: Not on file  Occupational History   Not on file  Tobacco Use   Smoking status: Never    Smokeless tobacco: Never  Substance and Sexual Activity   Alcohol use: No   Drug use: No   Sexual activity: Not on file  Other Topics Concern   Not on file  Social History Narrative   Not on file   Social Determinants of Health   Financial Resource Strain: Not on file  Food Insecurity: Not on file  Transportation Needs: Not on file  Physical Activity: Not on file  Stress: Not on file  Social Connections: Not on file    Family History  Problem Relation Age of Onset   Diabetes Maternal Grandmother    Hypertension Maternal Grandmother    Asthma Other  Cancer Other    Stroke Other    Heart disease Other     Review of Systems  Constitutional:  Negative for chills and fever (temp last night 99).  HENT:  Positive for congestion (mild) and sinus pressure. Negative for ear pain, postnasal drip, sinus pain and sore throat.   Respiratory:  Positive for cough (dry) and wheezing (mild). Negative for shortness of breath.   Neurological:  Positive for headaches. Negative for dizziness.      Objective:   Vitals:   09/23/21 1558  BP: 136/84  Pulse: 85  Temp: 98.6 F (37 C)  SpO2: 99%   BP Readings from Last 3 Encounters:  09/23/21 136/84  09/07/21 128/82  04/13/21 130/80   Wt Readings from Last 3 Encounters:  09/23/21 (!) 306 lb (138.8 kg)  09/07/21 (!) 301 lb 9.6 oz (136.8 kg)  04/13/21 (!) 319 lb 3.2 oz (144.8 kg)   Body mass index is 43.91 kg/m.   Physical Exam Constitutional:      General: He is not in acute distress.    Appearance: Normal appearance. He is not ill-appearing.  HENT:     Head: Normocephalic and atraumatic.     Right Ear: Tympanic membrane, ear canal and external ear normal. There is no impacted cerumen.     Left Ear: Tympanic membrane, ear canal and external ear normal.     Nose: Nose normal.     Mouth/Throat:     Mouth: Mucous membranes are moist.     Pharynx: No oropharyngeal exudate or posterior oropharyngeal erythema.  Eyes:      Conjunctiva/sclera: Conjunctivae normal.  Neck:     Comments: Trach in place Cardiovascular:     Rate and Rhythm: Normal rate and regular rhythm.  Pulmonary:     Effort: Pulmonary effort is normal. No respiratory distress.     Breath sounds: Normal breath sounds. No wheezing or rales.  Musculoskeletal:     Cervical back: Neck supple. No tenderness.  Lymphadenopathy:     Cervical: No cervical adenopathy.  Skin:    General: Skin is warm and dry.  Neurological:     Mental Status: He is alert.           Assessment & Plan:    URI, viral: Acute Symptoms likely viral in nature Tussionex cough syrup, albuterol inhaler, benzonatate cough pills - all sent to pharmacy Continue symptomatic treatment with over-the-counter cold medications, Tylenol/ibuprofen Increase rest and fluids Call if symptoms worsen or do not improve

## 2021-09-23 ENCOUNTER — Ambulatory Visit (INDEPENDENT_AMBULATORY_CARE_PROVIDER_SITE_OTHER): Payer: Managed Care, Other (non HMO) | Admitting: Internal Medicine

## 2021-09-23 ENCOUNTER — Other Ambulatory Visit: Payer: Self-pay

## 2021-09-23 VITALS — BP 136/84 | HR 85 | Temp 98.6°F | Ht 70.0 in | Wt 306.0 lb

## 2021-09-23 DIAGNOSIS — J069 Acute upper respiratory infection, unspecified: Secondary | ICD-10-CM

## 2021-09-23 MED ORDER — PRO COMFORT SPACER ADULT MISC: 0 refills | Status: AC

## 2021-09-23 MED ORDER — BENZONATATE 200 MG PO CAPS: 200.0000 mg | ORAL_CAPSULE | Freq: Three times a day (TID) | ORAL | 6 refills | Status: DC | PRN

## 2021-09-23 MED ORDER — HYDROCOD POLI-CHLORPHE POLI ER 10-8 MG/5ML PO SUER: 5.0000 mL | Freq: Two times a day (BID) | ORAL | 0 refills | Status: DC | PRN

## 2021-09-23 MED ORDER — METOPROLOL TARTRATE 50 MG PO TABS: 50.0000 mg | ORAL_TABLET | Freq: Two times a day (BID) | ORAL | 0 refills | Status: DC

## 2021-09-23 MED ORDER — ALBUTEROL SULFATE HFA 108 (90 BASE) MCG/ACT IN AERS: 2.0000 | INHALATION_SPRAY | Freq: Four times a day (QID) | RESPIRATORY_TRACT | 0 refills | Status: DC | PRN

## 2021-09-23 NOTE — Patient Instructions (Addendum)
° ° °  Your illness is likely viral in nature.    Medications changes include :   benzonatate, albuterol, tussionex cough syrup   Your prescription(s) have been sent to your pharmacy.     Return if symptoms worsen or fail to improve.

## 2021-09-26 LAB — POC COVID19 BINAXNOW: SARS Coronavirus 2 Ag: NEGATIVE

## 2021-09-26 NOTE — Addendum Note (Signed)
Addended by: Karma Ganja on: 09/26/2021 03:04 PM   Modules accepted: Orders

## 2021-10-10 ENCOUNTER — Other Ambulatory Visit: Payer: Self-pay

## 2021-10-10 ENCOUNTER — Ambulatory Visit (HOSPITAL_BASED_OUTPATIENT_CLINIC_OR_DEPARTMENT_OTHER): Payer: Commercial Managed Care - HMO | Attending: Pulmonary Disease | Admitting: Pulmonary Disease

## 2021-10-10 DIAGNOSIS — G4733 Obstructive sleep apnea (adult) (pediatric): Secondary | ICD-10-CM | POA: Diagnosis present

## 2021-10-11 ENCOUNTER — Telehealth: Payer: Self-pay | Admitting: Pulmonary Disease

## 2021-10-11 DIAGNOSIS — G4733 Obstructive sleep apnea (adult) (pediatric): Secondary | ICD-10-CM | POA: Diagnosis not present

## 2021-10-11 NOTE — Telephone Encounter (Signed)
PSG 10/11/21 >> AHI 53.4, SpO2 low 68%.  CPAP 13 cm H2O >> AHI 0, +R, +S, didn't need oxygen.  Trach capped. ? ? ? ?Please let him know his sleep study shows severe sleep apnea.  He did well in the second part of the study with CPAP.  Please send order to arrange for CPAP set up at 13 cm H2O with heated humidity and mask of choice.  He can start capping his tracheostomy at night once he starts using CPAP to sleep at night.  Please schedule follow up with me in 4 to 5 months. ?

## 2021-10-11 NOTE — Telephone Encounter (Signed)
Called and went over results with patient. He voiced understanding about results. He states he got a new cpap machine in 2019/2020 and may want to keep it. He states when he gets home and looks at his machine he will give me a call back and let me know if he wants to keep current machine or have an order placed for a new one.  ?Will wait for patients call back  ?

## 2021-10-11 NOTE — Procedures (Signed)
? ? ? ?Patient Name: Derrick [name], Derrick [name] ?Study Date: 10/10/2021 ?Gender: Male ?D.O.B: 07-26-1981 ?Age (years): 16 ?Referring Provider: Chesley Mires MD, ABSM ?Height (inches): 70 ?Interpreting Physician: Chesley Mires MD, ABSM ?Weight (lbs): 300 ?RPSGT: Steffey, Lennette Bihari ?BMI: 43 ?MRN: [id number] ?Neck Size: 18.00 ? ?CLINICAL INFORMATION ?Sleep Study Type: Split Night CPAP ? ?Indication for sleep study: Diabetes, Fatigue, Hypertension, Obesity, Snoring.  He has history of tracheostomy. ? ?Epworth Sleepiness Score: 7 ? ?SLEEP STUDY TECHNIQUE ?As per the AASM Manual for the Scoring of Sleep and Associated Events v2.3 (April 2016) with a hypopnea requiring 4% desaturations. ? ?The channels recorded and monitored were frontal, central and occipital EEG, electrooculogram (EOG), submentalis EMG (chin), nasal and oral airflow, thoracic and abdominal wall motion, anterior tibialis EMG, snore microphone, electrocardiogram, and pulse oximetry. Continuous positive airway pressure (CPAP) was initiated when the patient met split night criteria and was titrated according to treat sleep-disordered breathing. ? ?MEDICATIONS ?Medications self-administered by patient taken the night of the study : GABAPENTIN, METOPROLOL, LASIX, MUCINEX, MONTELUKAST ? ?RESPIRATORY PARAMETERS ?Diagnostic ? ?Total AHI (/hr): 53.4 RDI (/hr): 53.4 OA Index (/hr): 9.8 CA Index (/hr): 0.0 ?REM AHI (/hr): N/A NREM AHI (/hr): 53.4 Supine AHI (/hr): 90.2 Non-supine AHI (/hr): 11.1 ?Min O2 Sat (%): 68.0 Mean O2 (%): 90.3 Time below 88% (min): 33.6  ? ?Titration ? ?Optimal Pressure (cm): 13 AHI at Optimal Pressure (/hr): 0 Min O2 at Optimal Pressure (%): 96.0 ?Supine % at Optimal (%): 100 Sleep % at Optimal (%): 93  ? ? ?Study performed with his tracheostomy capped. ? ?SLEEP ARCHITECTURE ?The recording time for the entire night was 363.5 minutes. ? ?During a baseline period of 172.5 minutes, the patient slept for 128.0 minutes in REM and nonREM, yielding a sleep  efficiency of 74.2%%. Sleep onset after lights out was 38.8 minutes with a REM latency of N/A minutes. The patient spent 7.8%% of the night in stage N1 sleep, 92.2%% in stage N2 sleep, 0.0%% in stage N3 and 0% in REM. ? ?During the titration period of 190.0 minutes, the patient slept for 184.5 minutes in REM and nonREM, yielding a sleep efficiency of 97.1%%. Sleep onset after CPAP initiation was 0.3 minutes with a REM latency of 4.5 minutes. The patient spent 1.4%% of the night in stage N1 sleep, 59.6%% in stage N2 sleep, 0.0%% in stage N3 and 39% in REM. ? ?CARDIAC DATA ?The 2 lead EKG demonstrated sinus rhythm. The mean heart rate was 100.0 beats per minute. Other EKG findings include: None. ? ?LEG MOVEMENT DATA ?The total Periodic Limb Movements of Sleep (PLMS) were 0. The PLMS index was 0.0 . ? ?IMPRESSIONS ?- Study performed with his tracheostomy capped. ?- Severe obstructive sleep apnea with an AHI of 53.4 and SpO2 low of 68%. ?- He did well with CPAP 13 cm H2O.  ?- He did not require the use of supplemental oxygen during this study. ? ?DIAGNOSIS ?- Obstructive Sleep Apnea (G47.33) ? ?RECOMMENDATIONS ?- Trial of CPAP therapy on 13 cm H2O with a Medium size [employer] Full Face Simplus mask and heated humidification. ?- Avoid alcohol, sedatives and other CNS depressants that may worsen sleep apnea and disrupt normal sleep architecture. ?- Sleep hygiene should be reviewed to assess factors that may improve sleep quality. ?- Weight management and regular exercise should be initiated or continued. ? ?[Electronically signed] 10/11/2021 01:19 PM ? ?Chesley Mires MD, ABSM ?Diplomate, Tax adviser of Sleep Medicine ?NPI: [id number] ? ?[place]: [phone]  [place]: [phone] ?ACCREDITED BY THE AMERICAN ACADEMY OF SLEEP MEDICINE ? ?

## 2021-10-14 NOTE — Telephone Encounter (Signed)
ATC patient to follow up on making an appt for 4-5 months from now and to follow up on whether he wants a new cpap or to keep his current one. Asked patient on vm to call back at his convenience  ?

## 2021-10-28 NOTE — Telephone Encounter (Signed)
Called patient to f/u on cpap. He states he would like a new machine. Order placed. And f/u appt scheduled. Nothing further needed at this time ?

## 2021-10-28 NOTE — Addendum Note (Signed)
Addended by: Fritzi Mandes D on: 10/28/2021 01:05 PM ? ? Modules accepted: Orders ? ?

## 2021-11-15 ENCOUNTER — Ambulatory Visit (INDEPENDENT_AMBULATORY_CARE_PROVIDER_SITE_OTHER): Payer: Managed Care, Other (non HMO) | Admitting: Internal Medicine

## 2021-11-15 ENCOUNTER — Telehealth: Payer: Self-pay | Admitting: Acute Care

## 2021-11-15 ENCOUNTER — Ambulatory Visit (HOSPITAL_COMMUNITY)
Admission: RE | Admit: 2021-11-15 | Discharge: 2021-11-15 | Disposition: A | Discharge status: Home / Self Care | Payer: Managed Care, Other (non HMO) | Source: Ambulatory Visit | Attending: Acute Care | Admitting: Acute Care

## 2021-11-15 ENCOUNTER — Encounter: Payer: Self-pay | Admitting: Internal Medicine

## 2021-11-15 VITALS — BP 124/68 | HR 64 | Resp 18 | Ht 70.0 in | Wt 304.4 lb

## 2021-11-15 DIAGNOSIS — Z930 Tracheostomy status: Secondary | ICD-10-CM

## 2021-11-15 DIAGNOSIS — G4733 Obstructive sleep apnea (adult) (pediatric): Secondary | ICD-10-CM | POA: Insufficient documentation

## 2021-11-15 DIAGNOSIS — E559 Vitamin D deficiency, unspecified: Secondary | ICD-10-CM

## 2021-11-15 DIAGNOSIS — E538 Deficiency of other specified B group vitamins: Secondary | ICD-10-CM | POA: Diagnosis not present

## 2021-11-15 DIAGNOSIS — J069 Acute upper respiratory infection, unspecified: Secondary | ICD-10-CM

## 2021-11-15 DIAGNOSIS — N528 Other male erectile dysfunction: Secondary | ICD-10-CM

## 2021-11-15 DIAGNOSIS — E118 Type 2 diabetes mellitus with unspecified complications: Secondary | ICD-10-CM

## 2021-11-15 DIAGNOSIS — Z0000 Encounter for general adult medical examination without abnormal findings: Secondary | ICD-10-CM

## 2021-11-15 DIAGNOSIS — R21 Rash and other nonspecific skin eruption: Secondary | ICD-10-CM

## 2021-11-15 LAB — CBC
HCT: 38.2 % — ABNORMAL LOW (ref 39.0–52.0)
Hemoglobin: 12.8 g/dL — ABNORMAL LOW (ref 13.0–17.0)
MCHC: 33.5 g/dL (ref 30.0–36.0)
MCV: 89.9 fl (ref 78.0–100.0)
Platelets: 164 10*3/uL (ref 150.0–400.0)
RBC: 4.24 Mil/uL (ref 4.22–5.81)
RDW: 14.4 % (ref 11.5–15.5)
WBC: 5.6 10*3/uL (ref 4.0–10.5)

## 2021-11-15 LAB — VITAMIN D 25 HYDROXY (VIT D DEFICIENCY, FRACTURES): VITD: 17.23 ng/mL — ABNORMAL LOW (ref 30.00–100.00)

## 2021-11-15 LAB — MICROALBUMIN / CREATININE URINE RATIO
Creatinine,U: 101.4 mg/dL
Microalb Creat Ratio: 0.7 mg/g (ref 0.0–30.0)
Microalb, Ur: <0.7 mg/dL (ref 0.0–1.9)

## 2021-11-15 LAB — VITAMIN B12: Vitamin B-12: 308 pg/mL (ref 211–911)

## 2021-11-15 LAB — HEMOGLOBIN A1C: Hgb A1c MFr Bld: 5.3 % (ref 4.6–6.5)

## 2021-11-15 MED ORDER — DM-GUAIFENESIN ER 30-600 MG PO TB12: 1.0000 | ORAL_TABLET | Freq: Two times a day (BID) | ORAL | 6 refills | Status: AC

## 2021-11-15 MED ORDER — BENZONATATE 200 MG PO CAPS: 200.0000 mg | ORAL_CAPSULE | Freq: Three times a day (TID) | ORAL | 6 refills | Status: DC | PRN

## 2021-11-15 MED ORDER — FLUTICASONE PROPIONATE 50 MCG/ACT NA SUSP: 1.0000 | Freq: Every day | NASAL | 11 refills | Status: DC

## 2021-11-15 MED ORDER — LEVOCETIRIZINE DIHYDROCHLORIDE 5 MG PO TABS: 5.0000 mg | ORAL_TABLET | Freq: Every evening | ORAL | 3 refills | Status: DC

## 2021-11-15 MED ORDER — MONTELUKAST SODIUM 10 MG PO TABS: 10.0000 mg | ORAL_TABLET | Freq: Every day | ORAL | 3 refills | Status: DC

## 2021-11-15 MED ORDER — AZELASTINE HCL 0.1 % NA SOLN: 2.0000 | Freq: Two times a day (BID) | NASAL | 12 refills | Status: DC

## 2021-11-15 MED ORDER — SILDENAFIL CITRATE 100 MG PO TABS: 100.0000 mg | ORAL_TABLET | ORAL | 3 refills | Status: DC | PRN

## 2021-11-15 MED ORDER — NYSTATIN 100000 UNIT/GM EX POWD: 1.0000 "application " | Freq: Three times a day (TID) | CUTANEOUS | 11 refills | Status: DC

## 2021-11-15 NOTE — Telephone Encounter (Signed)
I've sent a message to Nida Boatman to let him know.  ?

## 2021-11-15 NOTE — Telephone Encounter (Signed)
3/31-message sent to [facility] with cpap order, 4/3-confirmation message was received from [place] ?

## 2021-11-15 NOTE — Assessment & Plan Note (Signed)
Checking vitamin d and adjust as needed. Taking otc currently.  ?

## 2021-11-15 NOTE — Progress Notes (Signed)
Tracheostomy Procedure Note ? ?Derrick [name] ?[id number] ?[birth year] ? ?Pre Procedure Tracheostomy Information ? ?Trach Brand: Portex  Bivona [id number] ?Size:  6.0 ?Style: Uncuffed ?Secured by: Velcro ? ? ?Procedure:Trach chang and trach cleaning ? ? ? ?Post Procedure Tracheostomy Information ? ?Trach Brand: Portex  Bivona  [id number] ?Size:  6.0 ?Style: Uncuffed ?Secured by: Velcro ? ? ?Post Procedure Evaluation: ? ?ETCO2 positive color change from yellow to purple : Yes.   ?Vital signs:VSS ?Patients current condition: stable ?Complications: No apparent complications ?Trach site exam: clean, dry ?Wound care done: 4 x 4 gauze drain ?Patient did tolerate procedure well. ? ? ?Education: ?none ? ?Prescription needs: ?none ? ? ? ?Additional needs: ?Mew PMV today ? ? ? ? ? ? ?  ?

## 2021-11-15 NOTE — Progress Notes (Signed)
Reason for visit  ?Planned trach care ? ?HPI ?Derrick [name] is well known to me. I have followed him for about 7 years now for trach management in the context of severe OSA and previously OHS but now mostly OSA after very successful weight loss efforts on his part over the last several years. He just recently had his repeat PSG and although he has severe sleep apnea he did well w/ CPAP of 13 cmH2O and now we are just waiting for the CPAP machine to be delivered.  He is here for routine trach change. Has no new complaints. He continues to be healthier each visit I see him  ? ?Review of Systems  ?All other systems reviewed and are negative.  ?Only complaint is that he gets uncomfortable under his skin folds from all of his weight loss and at times these are discolored as well.  ? ?Exam  ?General ambulatory. Able to walk into clinic, In fact today instead of using the parking concierge service he parked himself and walked from the garage.  ?HENT NCAT no JVD. The trach is midline. He phonates well ?Pulm clear  ?Card rrr ?Abd soft ?Ext warm  ?Neuro intact ? ?Procedure  ?The old 32 Bivona was removed. Using lidocaine jelly the new Portex bivona trach was inserted over obturator w/out difficulty. Placement was verified using ETCO2 ? ?Impression/plan ?Trach dependence  ?Severe OSA ? ?Discussion  ?Ready for decannulation once he has his CPAP. Already has demonstrated he can do well w/ capping trials ? ?Plan ?Cont routine trach care ?Planning for trach change in 2-3 months in CPAP still has not arrived ?Once CPAP arrives and he has had about a week to adjust we will go ahead w/ decannulation. I told him should it arrive sooner than our next scheduled appointment we can schedule him earlier as a work-in ? ?My time 20 minutes ? ?[name] ACNP-BC ?[facility] ?Pager # [phone] OR # [phone] if no answer ? ?

## 2021-11-15 NOTE — Assessment & Plan Note (Signed)
Weight is down from September and he is still working on weight loss. S/P weight loss procedure within last 1-2 years. ?

## 2021-11-15 NOTE — Assessment & Plan Note (Signed)
Checking B12 and adjust as needed. 

## 2021-11-15 NOTE — Assessment & Plan Note (Signed)
Foot exam done, checking HgA1c and microalbumin to creatinine ratio. If Hga1c is still <6 will change to history of diabetes given his massive weight loss. Referral to ophtho and podiatry. ?

## 2021-11-15 NOTE — Assessment & Plan Note (Signed)
Flu shot yearly. Covid-19 counseled. Tetanus up to date. Counseled about sun safety and mole surveillance. Counseled about the dangers of distracted driving. Given 10 year screening recommendations.   

## 2021-11-15 NOTE — Assessment & Plan Note (Signed)
He is hoping to have this removed in the near future and switch to CPAP alone. ?

## 2021-11-15 NOTE — Assessment & Plan Note (Signed)
Rx nystatin powder to use on excessive skin. ?

## 2021-11-15 NOTE — Telephone Encounter (Signed)
Message from [place] ?New, Lisa Roca, Alison Stalling ?Hello Marchelle Folks,  ? ?Last note on the account was today 11/15/21 that says we called patient and he said he would call us back to schedule a time @ 123pm.  ? ?This was the second attempt to contact patient. per note,  We are awaiting call back to schedule at this time.  ? ?Thank you,  ? ?Brad New  ?

## 2021-11-15 NOTE — Progress Notes (Signed)
? ?  Subjective:  ? ?Patient ID: Derrick [name], male    DOB: [birth year], 41 y.o.   MRN: [id number] ? ?HPI ?The patient is here for physical. ? ?PMH, [facility], social history reviewed and updated ? ?Review of Systems  ?Constitutional: Negative.   ?HENT: Negative.    ?Eyes: Negative.   ?Respiratory:  Negative for cough, chest tightness and shortness of breath.   ?Cardiovascular:  Positive for leg swelling. Negative for chest pain and palpitations.  ?Gastrointestinal:  Negative for abdominal distention, abdominal pain, constipation, diarrhea, nausea and vomiting.  ?Musculoskeletal: Negative.   ?Skin: Negative.   ?Neurological: Negative.   ?Psychiatric/Behavioral: Negative.    ? ?Objective:  ?Physical Exam ?Constitutional:   ?   Appearance: He is well-developed.  ?HENT:  ?   Head: Normocephalic and atraumatic.  ?Cardiovascular:  ?   Rate and Rhythm: Normal rate and regular rhythm.  ?Pulmonary:  ?   Effort: Pulmonary effort is normal. No respiratory distress.  ?   Breath sounds: Normal breath sounds. No wheezing or rales.  ?Abdominal:  ?   General: Bowel sounds are normal. There is no distension.  ?   Palpations: Abdomen is soft.  ?   Tenderness: There is no abdominal tenderness. There is no rebound.  ?Musculoskeletal:  ?   Cervical back: Normal range of motion.  ?Skin: ?   General: Skin is warm and dry.  ?   Comments: Foot exam done  ?Neurological:  ?   Mental Status: He is alert and oriented to person, place, and time.  ?   Coordination: Coordination normal.  ? ? ?Vitals:  ? 11/15/21 0908  ?BP: 124/68  ?Pulse: 64  ?Resp: 18  ?SpO2: 98%  ?Weight: (!) 304 lb 6.4 oz (138.1 kg)  ?Height: 5\' 10"  (1.778 m)  ? ? ?This visit occurred during the SARS-CoV-2 public health emergency.  Safety protocols were in place, including screening questions prior to the visit, additional usage of staff PPE, and extensive cleaning of exam room while observing appropriate contact time as indicated for disinfecting solutions.  ? ?Assessment &  Plan:  ? ?

## 2021-11-15 NOTE — Assessment & Plan Note (Signed)
Refilled viagra

## 2021-11-21 ENCOUNTER — Other Ambulatory Visit: Payer: Self-pay | Admitting: Internal Medicine

## 2021-11-21 MED ORDER — VITAMIN D (ERGOCALCIFEROL) 1.25 MG (50000 UNIT) PO CAPS: 50000.0000 [IU] | ORAL_CAPSULE | ORAL | 0 refills | Status: DC

## 2021-11-22 ENCOUNTER — Ambulatory Visit (INDEPENDENT_AMBULATORY_CARE_PROVIDER_SITE_OTHER): Payer: Managed Care, Other (non HMO) | Admitting: Internal Medicine

## 2021-11-22 ENCOUNTER — Encounter: Payer: Self-pay | Admitting: Internal Medicine

## 2021-11-22 DIAGNOSIS — J0110 Acute frontal sinusitis, unspecified: Secondary | ICD-10-CM

## 2021-11-22 MED ORDER — DOXYCYCLINE HYCLATE 100 MG PO TABS: 100.0000 mg | ORAL_TABLET | Freq: Two times a day (BID) | ORAL | 0 refills | Status: DC

## 2021-11-22 MED ORDER — FLUCONAZOLE 150 MG PO TABS: 150.0000 mg | ORAL_TABLET | ORAL | 0 refills | Status: DC

## 2021-11-22 NOTE — Progress Notes (Signed)
? ?  Subjective:  ? ?Patient ID: Derrick [name], male    DOB: [birth year], 41 y.o.   MRN: [id number] ? ?HPI ?The patient is a 41 YO man coming in for cough 4 days. Some SOB and sinus pain/congestion. ? ?Review of Systems  ?Constitutional:  Positive for activity change, appetite change and chills. Negative for fatigue, fever and unexpected weight change.  ?HENT:  Positive for congestion, postnasal drip, rhinorrhea and sinus pressure. Negative for ear discharge, ear pain, sinus pain, sneezing, sore throat, tinnitus, trouble swallowing and voice change.   ?Eyes: Negative.   ?Respiratory:  Positive for cough and shortness of breath. Negative for chest tightness and wheezing.   ?Cardiovascular: Negative.   ?Gastrointestinal: Negative.   ?Musculoskeletal:  Positive for myalgias.  ?Neurological: Negative.   ? ?Objective:  ?Physical Exam ?Constitutional:   ?   Appearance: He is well-developed.  ?HENT:  ?   Head: Normocephalic and atraumatic.  ?   Comments: Oropharynx with redness and clear drainage, nose with swollen turbinates, TMs normal bilaterally.  ?Neck:  ?   Thyroid: No thyromegaly.  ?Cardiovascular:  ?   Rate and Rhythm: Normal rate and regular rhythm.  ?Pulmonary:  ?   Effort: Pulmonary effort is normal. No respiratory distress.  ?   Breath sounds: Rhonchi present. No wheezing or rales.  ?   Comments: Some scattered rhonchi exam limited by habitus ?Abdominal:  ?   General: Bowel sounds are normal. There is no distension.  ?   Palpations: Abdomen is soft.  ?   Tenderness: There is no abdominal tenderness. There is no rebound.  ?Musculoskeletal:     ?   General: No tenderness.  ?   Cervical back: Normal range of motion.  ?Lymphadenopathy:  ?   Cervical: No cervical adenopathy.  ?Skin: ?   General: Skin is warm and dry.  ?Neurological:  ?   Mental Status: He is alert and oriented to person, place, and time.  ?   Coordination: Coordination normal.  ? ? ?Vitals:  ? 11/22/21 1449  ?BP: 130/84  ?Pulse: 86  ?Resp: 18   ?Temp: 98.3 ?F (36.8 ?C)  ?TempSrc: Oral  ?SpO2: 99%  ?Weight: (!) 312 lb 12.8 oz (141.9 kg)  ?Height: 5\' 10"  (1.778 m)  ? ? ?This visit occurred during the SARS-CoV-2 public health emergency.  Safety protocols were in place, including screening questions prior to the visit, additional usage of staff PPE, and extensive cleaning of exam room while observing appropriate contact time as indicated for disinfecting solutions.  ? ?Assessment & Plan:  ? ?

## 2021-11-22 NOTE — Assessment & Plan Note (Signed)
Rx doxycycline due to tracheostomy needs early intervention with antibiotics. Some scattered rhonchi on lung exam. Rx diflucan as he gets yeast infection with antibiotics. Encouraged to continue taking flonase and zyrtec and azelastine as well. Albuterol as needed for SOB.  ?

## 2021-11-22 NOTE — Patient Instructions (Signed)
We have sent in the doxycycline to take 1 pill twice a day for 1 week. ? ?We have also sent in the diflucan ?

## 2021-11-24 ENCOUNTER — Ambulatory Visit: Payer: Managed Care, Other (non HMO) | Admitting: Podiatry

## 2021-12-05 ENCOUNTER — Ambulatory Visit: Payer: Managed Care, Other (non HMO) | Admitting: Podiatry

## 2021-12-15 ENCOUNTER — Encounter: Payer: Self-pay | Admitting: Podiatry

## 2021-12-15 ENCOUNTER — Ambulatory Visit: Payer: Commercial Managed Care - HMO | Admitting: Podiatry

## 2021-12-15 DIAGNOSIS — M79675 Pain in left toe(s): Secondary | ICD-10-CM

## 2021-12-15 DIAGNOSIS — B351 Tinea unguium: Secondary | ICD-10-CM

## 2021-12-15 DIAGNOSIS — M79674 Pain in right toe(s): Secondary | ICD-10-CM

## 2021-12-15 DIAGNOSIS — L84 Corns and callosities: Secondary | ICD-10-CM

## 2021-12-15 DIAGNOSIS — E118 Type 2 diabetes mellitus with unspecified complications: Secondary | ICD-10-CM | POA: Diagnosis not present

## 2021-12-15 NOTE — Progress Notes (Signed)
  Subjective:  Patient ID: Derrick [name], male    DOB: 1981/02/15,   MRN: [id number]  Chief Complaint  Patient presents with   Nail Problem     Routine foot care    41 y.o. male presents for concern of thickened elongated and painful nails that are difficult to trim. Requesting to have them trimmed today. Relates burning and tingling in their feet. Patient is diabetic and last A1c was  Lab Results  Component Value Date   HGBA1C 5.3 11/15/2021   .   PCP:  Hoyt Koch, MD    . Denies any other pedal complaints. Denies n/v/f/c.   Past Medical History:  Diagnosis Date   Acute on chronic diastolic CHF (congestive heart failure), NYHA class 1 (HCC)    Diabetes mellitus without complication (HCC)    GERD (gastroesophageal reflux disease)    Hypertension    Hypoxemia    Morbid obesity (HCC)    Obesity hypoventilation syndrome (HCC)    OSA (obstructive sleep apnea)    Reflux     Objective:  Physical Exam: Vascular: DP/PT pulses 2/4 bilateral. CFT <3 seconds. Absent hair growth on digits. Edema noted to bilateral lower extremities. Xerosis noted bilaterally.  Skin. No lacerations or abrasions bilateral feet. Nails 1-5 bilateral  are thickened discolored and elongated with subungual debris. Incurvation of medial borders of bilateral hallux.  Hyperkeratotic lesion noted to lateral fifth metatarsal on left.  Musculoskeletal: MMT 5/5 bilateral lower extremities in DF, PF, Inversion and Eversion. Deceased ROM in DF of ankle joint.  Neurological: Sensation intact to light touch. Protective sensation diminished bilateral.    Assessment:   1. Diabetes mellitus type 2 with complications (HCC)   2. Pain due to onychomycosis of toenails of both feet      Plan:  Patient was evaluated and treated and all questions answered. -Discussed and educated patient on diabetic foot care, especially with  regards to the vascular, neurological and musculoskeletal systems.  -Stressed  the importance of good glycemic control and the detriment of not  controlling glucose levels in relation to the foot. -Discussed supportive shoes at all times and checking feet regularly.  -Mechanically debrided all nails 1-5 bilateral using sterile nail nipper and filed with dremel without incident  -Answered all patient questions -Patient to return  in 3 months for at risk foot care -Patient advised to call the office if any problems or questions arise in the meantime.   Lorenda Peck, DPM

## 2022-01-19 ENCOUNTER — Ambulatory Visit (HOSPITAL_COMMUNITY)
Admission: RE | Admit: 2022-01-19 | Discharge: 2022-01-19 | Disposition: A | Discharge status: Home / Self Care | Payer: Commercial Managed Care - HMO | Source: Ambulatory Visit | Attending: Acute Care | Admitting: Acute Care

## 2022-01-19 ENCOUNTER — Telehealth: Payer: Self-pay | Admitting: Acute Care

## 2022-01-19 DIAGNOSIS — Z930 Tracheostomy status: Secondary | ICD-10-CM

## 2022-01-19 NOTE — Telephone Encounter (Signed)
The CPAP order was sent to [facility].  I called [employer] & spoke to Nida Boatman - his direct # is [phone].  He states pt was called on 5/3 to schedule set-up and was notified of prior balance on acct and told that he would need to set up payment arrangements.  He told them he would call back to do that.  On 5/24 they reached out to pt again because he had not called them back.  When he answered and was told who was calling the line went dead.  They are now waiting to hear back from pt.  He can call [phone] and ask for [employer].  Will route this message to [facility] per his request.

## 2022-01-19 NOTE — Progress Notes (Addendum)
Tracheostomy Procedure Note  Derrick [name] [id number] [birth year]  Pre Procedure Tracheostomy Information  Trach Brand: Shiley Size:  6.0  60A160   Bivonn Portex Style: Uncuffed Secured by: Velcro   Procedure: Trach cleaning and Trach change  Lidocaine Jelly with trach change  Post Procedure Tracheostomy Information  Trach Brand: Shiley Size:  6.0   Bivona Portex   60A160 Style: Uncuffed Secured by: Velcro   Post Procedure Evaluation:  ETCO2 positive color change from yellow to purple : Yes.   Vital signs:VSS Patients current condition: stable Complications: No apparent complications Trach site exam: clean, dry Wound care done: 4 x 4 gauze drain Patient did tolerate procedure well.   Education: none  Prescription needs: none    Additional needs: none

## 2022-01-19 NOTE — Progress Notes (Cosign Needed)
Reason for visit Sick work in.   HPI Known well to me. Trach dependent 2/2 OSA but he is ready for decannulation as soon as CPAP all set up. Presents today w/ concern about his trach and wanted to be evaluated. He comes today reporting increased air escaping from around the trach stoma when he has the trach capped. He describes it as intermittent, some times associated w/ a little light-headedness, especially when has the PMV instead of the regular capping valve.  He presents today to have this evaluated.   Review of Systems  Constitutional:  Positive for weight loss. Negative for chills, diaphoresis, fever and malaise/fatigue.       Weight loss is on purpose s/p gastric sleeve   HENT: Negative.    Eyes: Negative.   Respiratory: Negative.    Cardiovascular: Negative.   Gastrointestinal: Negative.   Genitourinary: Negative.   Musculoskeletal: Negative.   Skin: Negative.   Neurological: Negative.   Endo/Heme/Allergies: Negative.   Psychiatric/Behavioral: Negative.     Exam Pulse ox mid-90s  General ambulatory 40 year old male. Walked into clinic. No distress HENT NCAT no JVD. Trach unremarkable Pulm clear Card rrr Abd soft Ext warm and dry  Neuro intact   Procedure Trach change The pt's trach was removed. Using viscous lidocaine a new 6 bivona portex inserted w/out difficulty. Site unremarkable. Placement verified via ETCO2  Impression/plan Trach dependent OSA Awaiting decannulation  Discussion I think this complaint he had was simply positional and a result of all of his weight loss. I suspect the distal end of trach abuts the posterior trach wall changing the position of the trach allowing for air loss from the stoma. It does not appear as though there is any concern with the trach  Plan Cont routine trach care I have messaged our office as well as reached out to home health to see about where we are w/ his CPAP. Apparently he still has a balance with them and needs to  discuss this w/ the financial office to set up payment plan. I have sent a message to him to let him know as well as included all of the contact numbers.  As soon as he has his CPAP operational I will make plans to take the trach out.   My time 20 min  [name] ACNP-BC [facility] Pulmonary/Critical Care Pager # [phone] OR # [phone] if no answer

## 2022-01-26 ENCOUNTER — Other Ambulatory Visit: Payer: Self-pay | Admitting: Internal Medicine

## 2022-01-26 DIAGNOSIS — E118 Type 2 diabetes mellitus with unspecified complications: Secondary | ICD-10-CM

## 2022-02-15 ENCOUNTER — Ambulatory Visit: Payer: Managed Care, Other (non HMO) | Admitting: Pulmonary Disease

## 2022-02-18 ENCOUNTER — Other Ambulatory Visit: Payer: Self-pay | Admitting: Internal Medicine

## 2022-02-18 DIAGNOSIS — E118 Type 2 diabetes mellitus with unspecified complications: Secondary | ICD-10-CM

## 2022-02-28 ENCOUNTER — Encounter: Payer: Self-pay | Admitting: Internal Medicine

## 2022-02-28 LAB — HM DIABETES EYE EXAM

## 2022-03-08 ENCOUNTER — Encounter (INDEPENDENT_AMBULATORY_CARE_PROVIDER_SITE_OTHER): Payer: Commercial Managed Care - HMO | Admitting: Ophthalmology

## 2022-03-08 ENCOUNTER — Encounter (INDEPENDENT_AMBULATORY_CARE_PROVIDER_SITE_OTHER): Payer: Self-pay

## 2022-03-08 DIAGNOSIS — H3581 Retinal edema: Secondary | ICD-10-CM

## 2022-03-17 ENCOUNTER — Encounter (INDEPENDENT_AMBULATORY_CARE_PROVIDER_SITE_OTHER): Payer: Self-pay

## 2022-03-17 ENCOUNTER — Encounter (INDEPENDENT_AMBULATORY_CARE_PROVIDER_SITE_OTHER): Payer: Commercial Managed Care - HMO | Admitting: Ophthalmology

## 2022-03-17 DIAGNOSIS — H3581 Retinal edema: Secondary | ICD-10-CM

## 2022-03-23 ENCOUNTER — Ambulatory Visit: Payer: Commercial Managed Care - HMO | Admitting: Podiatry

## 2022-03-27 ENCOUNTER — Encounter: Payer: Self-pay | Admitting: Pulmonary Disease

## 2022-03-27 ENCOUNTER — Ambulatory Visit: Payer: Commercial Managed Care - HMO | Admitting: Pulmonary Disease

## 2022-03-27 VITALS — BP 140/80 | HR 74 | Ht 69.0 in | Wt 331.4 lb

## 2022-03-27 DIAGNOSIS — G4733 Obstructive sleep apnea (adult) (pediatric): Secondary | ICD-10-CM

## 2022-03-27 NOTE — Progress Notes (Signed)
[facility]  Chief Complaint  Patient presents with   Follow-up    Follow-up    Past Surgical History:  He  has a past surgical history that includes Tracheostomy tube placement (N/A, 06/21/2015).  Past Medical History:  Diastolic CHF, DM, GERD, HTN, Allergies, Gout, Asthma, Neuropathy  Constitutional:  BP (!) 140/80 (BP Location: Right Arm)   Pulse 74   Ht 5\' 9"  (1.753 m)   Wt (!) 331 lb 6.4 oz (150.3 kg)   SpO2 97%   BMI 48.94 kg/m   Brief Summary:  Derrick [name] is a 41 y.o. male with obstructive sleep apnea and obesity hypoventilation syndrome.      Subjective:   Weight at last visit in February was 301 lbs.  He had trouble with his diet over the past few months, but is changing this again.  He hasn't received CPAP supplies.  Says he never heard from the DME.  He has a CPAP that still works.  Physical Exam:   Appearance - well kempt   ENMT - no sinus tenderness, no oral exudate, no LAN, Mallampati 3 airway, no stridor, tracheostomy site clean  Respiratory - equal breath sounds bilaterally, no wheezing or rales  CV - s1s2 regular rate and rhythm, no murmurs  Ext - no clubbing, no edema  Skin - no rashes  Psych - normal mood and affect   Sleep Tests:  PSG 09/09/18 >> AHI 123.8, SpO2 low 65%.  CPAP 18 cm H2O with 1 liter oxygen >> AHI 11.8.  Trach capped during study. PSG 10/11/21 >> AHI 53.4, SpO2 low 68%.  CPAP 13 cm H2O >> AHI 0, +R, +S, didn't need oxygen.  Trach capped.  Cardiac Tests:  Echo 06/14/15 >> EF 55 to 60%, grade 2 DD  Social History:  He  reports that he has never smoked. He has never used smokeless tobacco. He reports that he does not drink alcohol and does not use drugs.  Family History:  His family history includes Asthma in an other family member; Cancer in an other family member; Diabetes in his maternal grandmother; Heart disease in an other family member; Hypertension in his  maternal grandmother; Stroke in an other family member.     Assessment/Plan:   Obstructive sleep apnea. - he has a CPAP machine that is still functional - will have [employer] set his CPAP at 13 cm H2O and arrange for new mask and supplies  Obesity hypoventilation syndrome. - discussed how his oxygen needs could get worse if his weight goes up again  Tracheostomy status. - followed by 06/16/15 with [facility]  Obesity. - s/p gastric sleeve in 2021 - followed at [facility] b[facility]  Time Spent Involved in Patient Care on Day of Examination:  27 minutes  Follow up:   There are no Patient Instructions on file for this visit.  Medication List:   Allergies as of 03/27/2022       Reactions   Vancomycin Other (See Comments)   Affected kidneys    Shrimp [shellfish Allergy]    Zosyn [piperacillin Sod-tazobactam So] Other (See Comments)        Medication List        Accurate as of March 27, 2022  9:54 AM. If you have any questions, ask your nurse or doctor.          acetaminophen 325 MG tablet Commonly known as: TYLENOL Take 2 tablets (650 mg total) by  mouth every 6 (six) hours as needed for mild pain or headache (fever >/= 101).   albuterol 108 (90 Base) MCG/ACT inhaler Commonly known as: VENTOLIN HFA Inhale 2 puffs into the lungs every 6 (six) hours as needed for wheezing or shortness of breath.   allopurinol 300 MG tablet Commonly known as: ZYLOPRIM Take 1 tablet by mouth once daily   azelastine 0.1 % nasal spray Commonly known as: ASTELIN Place 2 sprays into both nostrils 2 (two) times daily. Use in each nostril as directed   benzonatate 200 MG capsule Commonly known as: TESSALON Take 1 capsule (200 mg total) by mouth 3 (three) times daily as needed.   clindamycin 1 % gel Commonly known as: CLINDAGEL Apply 1 application topically daily.   dextromethorphan-guaiFENesin 30-600 MG 12hr tablet Commonly known as: MUCINEX DM Take 1  tablet by mouth 2 (two) times daily.   doxycycline 100 MG tablet Commonly known as: VIBRA-TABS Take 1 tablet (100 mg total) by mouth 2 (two) times daily.   fluconazole 150 MG tablet Commonly known as: DIFLUCAN Take 1 tablet (150 mg total) by mouth every 3 (three) days.   fluticasone 110 MCG/ACT inhaler Commonly known as: Flovent HFA Inhale 2 puffs into the lungs 2 (two) times daily. What changed:  when to take this reasons to take this   fluticasone 50 MCG/ACT nasal spray Commonly known as: FLONASE Place 1 spray into both nostrils daily.   furosemide 80 MG tablet Commonly known as: LASIX Take 2 tablets (160 mg total) by mouth 3 (three) times daily as needed.   gabapentin 300 MG capsule Commonly known as: NEURONTIN TAKE 3 CAPSULES BY MOUTH THREE TIMES DAILY   levocetirizine 5 MG tablet Commonly known as: XYZAL Take 1 tablet (5 mg total) by mouth every evening.   metoCLOPramide 5 MG/5ML solution Commonly known as: REGLAN Take 10 mg by mouth 4 (four) times daily.   metoprolol tartrate 50 MG tablet Commonly known as: LOPRESSOR Take 1 tablet (50 mg total) by mouth 2 (two) times daily.   montelukast 10 MG tablet Commonly known as: SINGULAIR Take 1 tablet (10 mg total) by mouth at bedtime.   nystatin powder Commonly known as: MYCOSTATIN/NYSTOP Apply 1 application. topically 3 (three) times daily.   omeprazole 40 MG capsule Commonly known as: PRILOSEC Take 40 mg by mouth daily.   ondansetron 4 MG tablet Commonly known as: Zofran Take 1 tablet (4 mg total) by mouth every 8 (eight) hours as needed for nausea or vomiting.   OneTouch Delica Lancets 33G Misc Use as needed daily   potassium chloride 20 MEQ packet Commonly known as: KLOR-CON Take 20 mEq by mouth daily.   Pro Comfort Spacer Adult Misc UAD with inhaler   sildenafil 100 MG tablet Commonly known as: VIAGRA Take 1 tablet (100 mg total) by mouth as needed for erectile dysfunction.   ursodiol 300 MG  capsule Commonly known as: ACTIGALL Take 300 mg by mouth 2 (two) times daily.   Vitamin D (Ergocalciferol) 1.25 MG (50000 UNIT) Caps capsule Commonly known as: DRISDOL Take 1 capsule (50,000 Units total) by mouth every 7 (seven) days.        Signature:  Coralyn Helling, MD [facility] Pulmonary/Critical Care Pager - [phone] 03/27/2022, 9:54 AM

## 2022-03-27 NOTE — Patient Instructions (Signed)
Will have [employer] arrange for new CPAP supplies and set your CPAP at 13 cm water pressure  Follow up in 4 months

## 2022-03-28 ENCOUNTER — Ambulatory Visit (INDEPENDENT_AMBULATORY_CARE_PROVIDER_SITE_OTHER): Payer: Commercial Managed Care - HMO | Admitting: Ophthalmology

## 2022-03-28 ENCOUNTER — Encounter (INDEPENDENT_AMBULATORY_CARE_PROVIDER_SITE_OTHER): Payer: Self-pay | Admitting: Ophthalmology

## 2022-03-28 VITALS — BP 184/111

## 2022-03-28 DIAGNOSIS — I10 Essential (primary) hypertension: Secondary | ICD-10-CM | POA: Diagnosis not present

## 2022-03-28 DIAGNOSIS — H269 Unspecified cataract: Secondary | ICD-10-CM

## 2022-03-28 DIAGNOSIS — E113313 Type 2 diabetes mellitus with moderate nonproliferative diabetic retinopathy with macular edema, bilateral: Secondary | ICD-10-CM | POA: Diagnosis not present

## 2022-03-28 DIAGNOSIS — H35033 Hypertensive retinopathy, bilateral: Secondary | ICD-10-CM | POA: Diagnosis not present

## 2022-03-28 DIAGNOSIS — H34831 Tributary (branch) retinal vein occlusion, right eye, with macular edema: Secondary | ICD-10-CM

## 2022-03-28 NOTE — Progress Notes (Signed)
[facility] Note  03/28/2022     CHIEF COMPLAINT Patient presents for Retina Evaluation   HISTORY OF PRESENT ILLNESS: Derrick [name] is a 41 y.o. male who presents to the clinic today for:   HPI     Retina Evaluation   In both eyes.  This started years ago.  Associated Symptoms Floaters and Distortion.  Negative for Flashes.  Context:  near vision and reading.  I, the attending physician,  performed the HPI with the patient and updated documentation appropriately.        Comments   Patient is here today based on a referral from Dr. Lucianne Lei for NPDR. Patient states that after gastric bypass sx he was cleared of Diabetes. He does not check his sugars and his A1C is 5.3.       Last edited by Bernarda Caffey, MD on 03/28/2022 12:25 PM.    Pt is here on the referral of Dr. Lucianne Lei for NPDR, pt states his vision in the right eye has been bad for about a year, he states he thought he needed glasses, pt does not check his blood pressure at home, but yesterday at the dr it was 130/80, pt had gastric sleeve sx in 2021, he states after that he was able to come off his BP medication  Referring physician: Lisabeth Pick, MD [place],  [place] [address]  HISTORICAL INFORMATION:   Selected notes from the MEDICAL RECORD NUMBER Referred by Dr. Lucianne Lei for concern of NPDR LEE:  Ocular Hx- PMH-    CURRENT MEDICATIONS: No current outpatient medications on file. (Ophthalmic Drugs)   No current facility-administered medications for this visit. (Ophthalmic Drugs)   Current Outpatient Medications (Other)  Medication Sig   acetaminophen (TYLENOL) 325 MG tablet Take 2 tablets (650 mg total) by mouth every 6 (six) hours as needed for mild pain or headache (fever >/= 101).   albuterol (VENTOLIN HFA) 108 (90 Base) MCG/ACT inhaler Inhale 2 puffs into the lungs every 6 (six) hours as needed for wheezing or shortness of breath.   allopurinol (ZYLOPRIM) 300 MG  tablet Take 1 tablet by mouth once daily   azelastine (ASTELIN) 0.1 % nasal spray Place 2 sprays into both nostrils 2 (two) times daily. Use in each nostril as directed   benzonatate (TESSALON) 200 MG capsule Take 1 capsule (200 mg total) by mouth 3 (three) times daily as needed.   clindamycin (CLINDAGEL) 1 % gel Apply 1 application topically daily.   dextromethorphan-guaiFENesin (MUCINEX DM) 30-600 MG 12hr tablet Take 1 tablet by mouth 2 (two) times daily.   doxycycline (VIBRA-TABS) 100 MG tablet Take 1 tablet (100 mg total) by mouth 2 (two) times daily.   fluconazole (DIFLUCAN) 150 MG tablet Take 1 tablet (150 mg total) by mouth every 3 (three) days.   fluticasone (FLONASE) 50 MCG/ACT nasal spray Place 1 spray into both nostrils daily.   fluticasone (FLOVENT HFA) 110 MCG/ACT inhaler Inhale 2 puffs into the lungs 2 (two) times daily. (Patient taking differently: Inhale 2 puffs into the lungs 2 (two) times daily as needed (shortness of breath).)   furosemide (LASIX) 80 MG tablet Take 2 tablets (160 mg total) by mouth 3 (three) times daily as needed.   gabapentin (NEURONTIN) 300 MG capsule TAKE 3 CAPSULES BY MOUTH THREE TIMES DAILY   levocetirizine (XYZAL) 5 MG tablet Take 1 tablet (5 mg total) by mouth every evening.   metoCLOPramide (REGLAN) 5 MG/5ML solution Take 10  mg by mouth 4 (four) times daily.   metoprolol tartrate (LOPRESSOR) 50 MG tablet Take 1 tablet (50 mg total) by mouth 2 (two) times daily.   montelukast (SINGULAIR) 10 MG tablet Take 1 tablet (10 mg total) by mouth at bedtime.   nystatin (MYCOSTATIN/NYSTOP) powder Apply 1 application. topically 3 (three) times daily.   omeprazole (PRILOSEC) 40 MG capsule Take 40 mg by mouth daily.   ondansetron (ZOFRAN) 4 MG tablet Take 1 tablet (4 mg total) by mouth every 8 (eight) hours as needed for nausea or vomiting.   ONETOUCH DELICA LANCETS 10X MISC Use as needed daily   potassium chloride (KLOR-CON) 20 MEQ packet Take 20 mEq by mouth daily.    sildenafil (VIAGRA) 100 MG tablet Take 1 tablet (100 mg total) by mouth as needed for erectile dysfunction.   Spacer/Aero-Holding Chambers (PRO COMFORT SPACER ADULT) MISC UAD with inhaler   ursodiol (ACTIGALL) 300 MG capsule Take 300 mg by mouth 2 (two) times daily.   Vitamin D, Ergocalciferol, (DRISDOL) 1.25 MG (50000 UNIT) CAPS capsule Take 1 capsule (50,000 Units total) by mouth every 7 (seven) days.   No current facility-administered medications for this visit. (Other)   REVIEW OF SYSTEMS: ROS   Positive for: Endocrine Last edited by Annie Paras, COT on 03/28/2022  9:34 AM.     ALLERGIES Allergies  Allergen Reactions   Vancomycin Other (See Comments)    Affected kidneys    Shrimp [Shellfish Allergy]    Zosyn [Piperacillin Sod-Tazobactam So] Other (See Comments)   PAST MEDICAL HISTORY Past Medical History:  Diagnosis Date   Acute on chronic diastolic CHF (congestive heart failure), NYHA class 1 (HCC)    Diabetes mellitus without complication (HCC)    GERD (gastroesophageal reflux disease)    Hypertension    Hypoxemia    Morbid obesity ([place])    Obesity hypoventilation syndrome (HCC)    OSA (obstructive sleep apnea)    Reflux    Past Surgical History:  Procedure Laterality Date   TRACHEOSTOMY TUBE PLACEMENT N/A 06/21/2015   Procedure: TRACHEOSTOMY;  Surgeon: Leta Baptist, MD;  Location: [facility] OR;  Service: ENT;  Laterality: N/A;   FAMILY HISTORY Family History  Problem Relation Age of Onset   Diabetes Maternal Grandmother    Hypertension Maternal Grandmother    Asthma Other    Cancer Other    Stroke Other    Heart disease Other    SOCIAL HISTORY Social History   Tobacco Use   Smoking status: Never   Smokeless tobacco: Never  Substance Use Topics   Alcohol use: No   Drug use: No       OPHTHALMIC EXAM:  Base Eye Exam     Visual Acuity (Snellen - Linear)       Right Left   Dist [place] 20/300 +1 20/20   Dist ph [place] NI          Tonometry (Tonopen,  9:38 AM)       Right Left   Pressure 11 11         Pupils       Dark Light Shape React APD   Right 3 2 Round Brisk None   Left 3 2 Round Brisk None         Visual Fields       Left Right    Full Full         Extraocular Movement       Right Left    Full, Ortho  Full, Ortho         Neuro/Psych     Oriented x3: Yes         Dilation     Both eyes: 2.5% Phenylephrine, 1.0% Mydriacyl @ 9:34 AM           Slit Lamp and Fundus Exam     External Exam       Right Left   External Normal Normal         Slit Lamp Exam       Right Left   Lids/Lashes Dermatochalasis - upper lid Dermatochalasis - upper lid   Conjunctiva/Sclera White and quiet Melanosis   Cornea Trace tear film debris Trace tear film debris   Anterior Chamber Deep and quiet Deep and quiet   Iris Round and dilated, No NVI Round and dilated, No NVI   Lens 1-2+ Cortical cataract 1-2+ Cortical cataract   Anterior Vitreous Vitreous syneresis Vitreous syneresis         Fundus Exam       Right Left   Disc Pink and Sharp, vascular loops ST quad Pink and Sharp   C/D Ratio 0.5 0.55   Macula Blunted foveal reflex, DBH superior macula, +exudates and edema Good foveal reflex, Mas/cystic changes temporal mac, no exudates   Vessels attenuated, Tortuous -- BRVO ST arcades attenuated, Tortuous -- BRVO ST arcades   Periphery Attached, DBH / BRVO ST quad; sacattered MA Attached, DBH temporal periphery, scattered MA            IMAGING AND PROCEDURES  Imaging and Procedures for 03/28/2022  OCT, Retina - OU - Both Eyes       Right Eye Quality was good. Central Foveal Thickness: 677. Progression has no prior data. Findings include no SRF, abnormal foveal contour, intraretinal hyper-reflective material, intraretinal fluid.   Left Eye Quality was good. Central Foveal Thickness: 281. Progression has no prior data. Findings include normal foveal contour, no SRF, intraretinal hyper-reflective  material, intraretinal fluid (Mild cystic changes temporal macula and fovea).   Notes *Images captured and stored on drive  Diagnosis / Impression:  OD: BRVO w/ superior macular edema OS: Mild cystic changes temporal macula and fovea -- +DME  Clinical management:  See below  Abbreviations: NFP - Normal foveal profile. CME - cystoid macular edema. PED - pigment epithelial detachment. IRF - intraretinal fluid. SRF - subretinal fluid. EZ - ellipsoid zone. ERM - epiretinal membrane. ORA - outer retinal atrophy. ORT - outer retinal tubulation. SRHM - subretinal hyper-reflective material. IRHM - intraretinal hyper-reflective material             ASSESSMENT/PLAN:    ICD-10-CM   1. Branch retinal vein occlusion of right eye with macular edema  H34.8310 OCT, Retina - OU - Both Eyes    2. Moderate nonproliferative diabetic retinopathy of both eyes with macular edema associated with type 2 diabetes mellitus ([place])  R15.9458     3. Essential hypertension  I10     4. Hypertensive retinopathy of both eyes  H35.033 CANCELED: Fluorescein Angiography Optos (Transit OD)    5. Cortical cataract of both eyes  H26.9      BRVO w/ CME OD - The natural history of retinal vein occlusion and macular edema and treatment options including observation, laser photocoagulation, and intravitreal antiVEGF injection with Avastin and Lucentis and Eylea and intravitreal injection of steroids with triamcinolone and Ozurdex and the complications of these procedures including loss of vision, infection, cataract, glaucoma, and retinal detachment were  discussed with patient. - Specifically discussed findings from [place] / [place] study regarding patient stabilization with anti-VEGF agents and increased potential for visual improvements.  Also discussed need for frequent follow up and potentially multiple injections given the chronic nature of the disease process - BCVA 20/300 - OCT shows significant edema superior and  central macula - recommend IVA OD #1 today, but unable due to insurance authorization requirement and also elevated BP (see below) - F/U Thursday or Friday for FA and possible injection  2. Moderate Non-proliferative diabetic retinopathy OU - The incidence, risk factors for progression, natural history and treatment options for diabetic retinopathy were discussed with patient.   - The need for close monitoring of blood glucose, blood pressure, and serum lipids, avoiding cigarette or any type of tobacco, and the need for long term follow up was also discussed with patient. - exam shows scattered MA OU - OCT shows +cystic changes / diabetic macular edema, left eye  - The natural history, pathology, and characteristics of diabetic macular edema discussed with patient.  A generalized discussion of the major clinical trials concerning treatment of diabetic macular edema (ETDRS, DCT, SCORE, RISE / RIDE, and ongoing DRCR net studies) was completed.  This discussion included mention of the various approaches to treating diabetic macular edema (observation, laser photocoagulation, anti-VEGF injections with lucentis / Avastin / Eylea, steroid injections with Kenalog / Ozurdex, and intraocular surgery with vitrectomy).  The goal hemoglobin A1C of 6-7 was discussed, as well as importance of smoking cessation and hypertension control.  Need for ongoing treatment and monitoring were specifically discussed with reference to chronic nature of diabetic macular edema. - BCVA 20/20 OS - will monitor for now  3,4. Hypertensive retinopathy OU  - BP in office today 184/111  - review of records shows BP yesterday at [facility] was 140/80  - pt asymptomatic -- no headache, chest pain, palpitations, numbness/tingling - discussed importance of tight BP control - recommend contacting PCP to discuss BP, potentially restart meds  5. Cortical Cataract OU - The symptoms of cataract, surgical options, and treatments and risks  were discussed with patient. - discussed diagnosis and progression - not yet visually significant - monitor for now  Ophthalmic Meds Ordered this visit:  No orders of the defined types were placed in this encounter.    Return in about 2 days (around 03/30/2022) for BRVO OD; DME OS -- Dilated Exam, OCT, Fluorescein Angiogram, Possible Injxn.  There are no Patient Instructions on file for this visit.   Explained the diagnoses, plan, and follow up with the patient and they expressed understanding.  Patient expressed understanding of the importance of proper follow up care.   This document serves as a record of services personally performed by Gardiner Sleeper, MD, PhD. It was created on their behalf by San Jetty. Owens Shark, OA an ophthalmic technician. The creation of this record is the provider's dictation and/or activities during the visit.    Electronically signed by: San Jetty. Owens Shark, [place] 08.29.2023 5:01 PM  Gardiner Sleeper, M.D., Ph.D. Diseases & Surgery of the Retina and Vitreous [facility]  I have reviewed the above documentation for accuracy and completeness, and I agree with the above. Gardiner Sleeper, M.D., Ph.D. 03/28/22 5:01 PM  Abbreviations: M myopia (nearsighted); A astigmatism; H hyperopia (farsighted); P presbyopia; Mrx spectacle prescription;  CTL contact lenses; OD right eye; OS left eye; OU both eyes  XT exotropia; ET esotropia; PEK punctate epithelial keratitis;  PEE punctate epithelial erosions; DES dry eye syndrome; MGD meibomian gland dysfunction; ATs artificial tears; PFAT's preservative free artificial tears; [place] nuclear sclerotic cataract; PSC posterior subcapsular cataract; ERM epi-retinal membrane; PVD posterior vitreous detachment; RD retinal detachment; DM diabetes mellitus; DR diabetic retinopathy; NPDR non-proliferative diabetic retinopathy; PDR proliferative diabetic retinopathy; CSME clinically significant macular edema; DME diabetic macular  edema; dbh dot blot hemorrhages; CWS cotton wool spot; POAG primary open angle glaucoma; C/D cup-to-disc ratio; HVF humphrey visual field; GVF goldmann visual field; OCT optical coherence tomography; IOP intraocular pressure; BRVO Branch retinal vein occlusion; CRVO central retinal vein occlusion; CRAO central retinal artery occlusion; BRAO branch retinal artery occlusion; RT retinal tear; SB scleral buckle; PPV pars plana vitrectomy; VH Vitreous hemorrhage; PRP panretinal laser photocoagulation; IVK intravitreal kenalog; VMT vitreomacular traction; MH Macular hole;  NVD neovascularization of the disc; NVE neovascularization elsewhere; AREDS age related eye disease study; ARMD age related macular degeneration; POAG primary open angle glaucoma; EBMD epithelial/anterior basement membrane dystrophy; ACIOL anterior chamber intraocular lens; IOL intraocular lens; PCIOL posterior chamber intraocular lens; Phaco/IOL phacoemulsification with intraocular lens placement; [place] photorefractive keratectomy; LASIK laser assisted in situ keratomileusis; HTN hypertension; DM diabetes mellitus; COPD chronic obstructive pulmonary disease

## 2022-03-30 NOTE — Progress Notes (Signed)
[facility] Note  04/04/2022     CHIEF COMPLAINT Patient presents for Retina Follow Up   HISTORY OF PRESENT ILLNESS: Derrick [name] is a 41 y.o. male who presents to the clinic today for:   HPI     Retina Follow Up   Patient presents with  CRVO/BRVO.  In right eye.  Severity is moderate.  Duration of 1 week.  Since onset it is stable.  I, the attending physician,  performed the HPI with the patient and updated documentation appropriately.        Comments   Pt here for 1 wk ret f/u for BRVO OD. Pt states no VA changes. Reports BP has been steady, back on BP meds.       Last edited by Bernarda Caffey, MD on 04/04/2022  9:27 AM.    Pt reports he has been unable to get in contact with PCP office, but has since restarted his BP meds.   Referring physician: Hoyt Koch, MD [place],  [place] [address]  HISTORICAL INFORMATION:   Selected notes from the MEDICAL RECORD NUMBER Referred by Dr. Lucianne Lei for concern of NPDR LEE:  Ocular Hx- PMH-    CURRENT MEDICATIONS: No current outpatient medications on file. (Ophthalmic Drugs)   No current facility-administered medications for this visit. (Ophthalmic Drugs)   Current Outpatient Medications (Other)  Medication Sig   acetaminophen (TYLENOL) 325 MG tablet Take 2 tablets (650 mg total) by mouth every 6 (six) hours as needed for mild pain or headache (fever >/= 101).   albuterol (VENTOLIN HFA) 108 (90 Base) MCG/ACT inhaler Inhale 2 puffs into the lungs every 6 (six) hours as needed for wheezing or shortness of breath.   allopurinol (ZYLOPRIM) 300 MG tablet Take 1 tablet by mouth once daily   azelastine (ASTELIN) 0.1 % nasal spray Place 2 sprays into both nostrils 2 (two) times daily. Use in each nostril as directed   benzonatate (TESSALON) 200 MG capsule Take 1 capsule (200 mg total) by mouth 3 (three) times daily as needed.   clindamycin (CLINDAGEL) 1 % gel Apply 1 application  topically daily.   dextromethorphan-guaiFENesin (MUCINEX DM) 30-600 MG 12hr tablet Take 1 tablet by mouth 2 (two) times daily.   doxycycline (VIBRA-TABS) 100 MG tablet Take 1 tablet (100 mg total) by mouth 2 (two) times daily.   fluconazole (DIFLUCAN) 150 MG tablet Take 1 tablet (150 mg total) by mouth every 3 (three) days.   fluticasone (FLONASE) 50 MCG/ACT nasal spray Place 1 spray into both nostrils daily.   fluticasone (FLOVENT HFA) 110 MCG/ACT inhaler Inhale 2 puffs into the lungs 2 (two) times daily. (Patient taking differently: Inhale 2 puffs into the lungs 2 (two) times daily as needed (shortness of breath).)   furosemide (LASIX) 80 MG tablet Take 2 tablets (160 mg total) by mouth 3 (three) times daily as needed.   gabapentin (NEURONTIN) 300 MG capsule TAKE 3 CAPSULES BY MOUTH THREE TIMES DAILY   levocetirizine (XYZAL) 5 MG tablet Take 1 tablet (5 mg total) by mouth every evening.   metoCLOPramide (REGLAN) 5 MG/5ML solution Take 10 mg by mouth 4 (four) times daily.   metoprolol tartrate (LOPRESSOR) 50 MG tablet Take 1 tablet (50 mg total) by mouth 2 (two) times daily.   montelukast (SINGULAIR) 10 MG tablet Take 1 tablet (10 mg total) by mouth at bedtime.   nystatin (MYCOSTATIN/NYSTOP) powder Apply 1 application. topically 3 (three) times daily.  omeprazole (PRILOSEC) 40 MG capsule Take 40 mg by mouth daily.   ondansetron (ZOFRAN) 4 MG tablet Take 1 tablet (4 mg total) by mouth every 8 (eight) hours as needed for nausea or vomiting.   ONETOUCH DELICA LANCETS 06C MISC Use as needed daily   potassium chloride (KLOR-CON) 20 MEQ packet Take 20 mEq by mouth daily.   sildenafil (VIAGRA) 100 MG tablet Take 1 tablet (100 mg total) by mouth as needed for erectile dysfunction.   Spacer/Aero-Holding Chambers (PRO COMFORT SPACER ADULT) MISC UAD with inhaler   ursodiol (ACTIGALL) 300 MG capsule Take 300 mg by mouth 2 (two) times daily.   Vitamin D, Ergocalciferol, (DRISDOL) 1.25 MG (50000 UNIT) CAPS  capsule Take 1 capsule (50,000 Units total) by mouth every 7 (seven) days.   No current facility-administered medications for this visit. (Other)   REVIEW OF SYSTEMS: ROS   Positive for: Endocrine, Cardiovascular Negative for: Constitutional, Gastrointestinal, Neurological, Skin, Genitourinary, Musculoskeletal, HENT, Eyes, Respiratory, Psychiatric, Allergic/Imm, Heme/Lymph Last edited by Kingsley Spittle, COT on 04/04/2022  8:25 AM.      ALLERGIES Allergies  Allergen Reactions   Vancomycin Other (See Comments)    Affected kidneys    Shrimp [Shellfish Allergy]    Zosyn [Piperacillin Sod-Tazobactam So] Other (See Comments)   PAST MEDICAL HISTORY Past Medical History:  Diagnosis Date   Acute on chronic diastolic CHF (congestive heart failure), NYHA class 1 (HCC)    Diabetes mellitus without complication (HCC)    GERD (gastroesophageal reflux disease)    Hypertension    Hypoxemia    Morbid obesity ([place])    Obesity hypoventilation syndrome (HCC)    OSA (obstructive sleep apnea)    Reflux    Past Surgical History:  Procedure Laterality Date   TRACHEOSTOMY TUBE PLACEMENT N/A 06/21/2015   Procedure: TRACHEOSTOMY;  Surgeon: Leta Baptist, MD;  Location: [facility] OR;  Service: ENT;  Laterality: N/A;   FAMILY HISTORY Family History  Problem Relation Age of Onset   Diabetes Maternal Grandmother    Hypertension Maternal Grandmother    Asthma Other    Cancer Other    Stroke Other    Heart disease Other    SOCIAL HISTORY Social History   Tobacco Use   Smoking status: Never   Smokeless tobacco: Never  Substance Use Topics   Alcohol use: No   Drug use: No       OPHTHALMIC EXAM:  Base Eye Exam     Visual Acuity (Snellen - Linear)       Right Left   Dist [place] 20/150+1 20/20 -2   Dist ph [place] 20/100 -2          Tonometry (Tonopen, 8:33 AM)       Right Left   Pressure 14 19         Pupils       Dark Light Shape React APD   Right 3 2 Round Brisk None   Left 3 2 Round  Brisk None         Visual Fields (Counting fingers)       Left Right    Full Full         Extraocular Movement       Right Left    Full, Ortho Full, Ortho         Neuro/Psych     Oriented x3: Yes   Mood/Affect: Normal         Dilation     Both eyes: 1.0% Mydriacyl, 2.5%  Phenylephrine @ 8:37 AM           Slit Lamp and Fundus Exam     External Exam       Right Left   External Normal Normal         Slit Lamp Exam       Right Left   Lids/Lashes Dermatochalasis - upper lid Dermatochalasis - upper lid   Conjunctiva/Sclera White and quiet Melanosis   Cornea Trace tear film debris Trace tear film debris   Anterior Chamber Deep and quiet Deep and quiet   Iris Round and dilated, No NVI Round and dilated, No NVI   Lens 1-2+ Cortical cataract 1-2+ Cortical cataract   Anterior Vitreous Vitreous syneresis Vitreous syneresis         Fundus Exam       Right Left   Disc Pink and Sharp, vascular loops ST quad Pink and Sharp   C/D Ratio 0.5 0.55   Macula Blunted foveal reflex, DBH superior macula, +exudates and edema Good foveal reflex, Mas/cystic changes temporal mac, no exudates   Vessels attenuated, Tortuous -- BRVO ST arcades attenuated, Tortuous -- BRVO ST arcades   Periphery Attached, DBH / BRVO ST quad; sacattered MA Attached, DBH temporal periphery, scattered MA            IMAGING AND PROCEDURES  Imaging and Procedures for 04/04/2022  OCT, Retina - OU - Both Eyes       Right Eye Quality was good. Central Foveal Thickness: 677. Progression has improved. Findings include no SRF, abnormal foveal contour, intraretinal hyper-reflective material, intraretinal fluid (Mild interval improvement in IRF centrally and superior macula).   Left Eye Quality was good. Central Foveal Thickness: 273. Progression has improved. Findings include normal foveal contour, no SRF, intraretinal hyper-reflective material, intraretinal fluid (Mild interval improvement in  IRF temporal macula and fovea).   Notes *Images captured and stored on drive  Diagnosis / Impression:  OD: Mild interval improvement in IRF centrally and superior macula OS: Mild interval improvement in IRF temporal macula and fovea  Clinical management:  See below  Abbreviations: NFP - Normal foveal profile. CME - cystoid macular edema. PED - pigment epithelial detachment. IRF - intraretinal fluid. SRF - subretinal fluid. EZ - ellipsoid zone. ERM - epiretinal membrane. ORA - outer retinal atrophy. ORT - outer retinal tubulation. SRHM - subretinal hyper-reflective material. IRHM - intraretinal hyper-reflective material       Intravitreal Injection, Pharmacologic Agent - OD - Right Eye       Time Out 04/04/2022. 9:11 AM. Confirmed correct patient, procedure, site, and patient consented.   Anesthesia Topical anesthesia was used. Anesthetic medications included Lidocaine 2%, Proparacaine 0.5%.   Procedure Preparation included 5% betadine to ocular surface, eyelid speculum. A supplied needle was used.   Injection: 1.25 mg Bevacizumab 1.18m/0.05ml   Route: Intravitreal, Site: Right Eye   NDC: 50242-060-01   Post-op Post injection exam found visual acuity of at least counting fingers. The patient tolerated the procedure well. There were no complications. The patient received written and verbal post procedure care education.            ASSESSMENT/PLAN:    ICD-10-CM   1. Branch retinal vein occlusion of right eye with macular edema  H34.8310 OCT, Retina - OU - Both Eyes    Intravitreal Injection, Pharmacologic Agent - OD - Right Eye    CANCELED: Flourescein Angiography - OU - Both Eyes    2. Moderate nonproliferative diabetic retinopathy of both  eyes with macular edema associated with type 2 diabetes mellitus ([place])  X79.3903     3. Essential hypertension  I10     4. Hypertensive retinopathy of both eyes  H35.033     5. Cortical cataract of both eyes  H26.9        BRVO w/ CME OD - BCVA 20/100 from 20/300 - OCT shows OD: Mild interval improvement in IRF centrally and superior macula; OS: Mild interval improvement in IRF temporal macula and fovea  - recommend IVA OD #1 today, 09.05.23 - pt wishes to proceed with injection - RBA of procedure discussed, questions answered - IVA informed consent obtained and signed, 09.05.23 (OU) - see procedure note - F/U 4 weeks, DFE, OCT, FA (transit OD) and possible injection  2. Moderate Non-proliferative diabetic retinopathy OU - exam shows scattered MA OU - OCT shows +cystic changes / diabetic macular edema, left eye  - BCVA 20/20 OS - will monitor for now  3,4. Hypertensive retinopathy OU  - BP in office improved today to 145/90 from 184/111  - review of records shows BP yesterday at [facility] office was 140/80  - pt asymptomatic -- no headache, chest pain, palpitations, numbness/tingling - discussed importance of tight BP control - recommend contacting PCP to discuss BP, potentially restart meds  5. Cortical Cataract OU - The symptoms of cataract, surgical options, and treatments and risks were discussed with patient. - discussed diagnosis and progression - not yet visually significant - monitor for now  Ophthalmic Meds Ordered this visit:  No orders of the defined types were placed in this encounter.    Return in about 4 weeks (around 05/02/2022) for f/u BRVO OD, DFE, OCT.  There are no Patient Instructions on file for this visit.   Explained the diagnoses, plan, and follow up with the patient and they expressed understanding.  Patient expressed understanding of the importance of proper follow up care.   This document serves as a record of services personally performed by Gardiner Sleeper, MD, PhD. It was created on their behalf by Renaldo Reel, [place] an ophthalmic technician. The creation of this record is the provider's dictation and/or activities during the visit.    Electronically signed  by:  Renaldo Reel, COT  03/30/22 9:27 AM  This document serves as a record of services personally performed by Gardiner Sleeper, MD, PhD. It was created on their behalf by San Jetty. Owens Shark, OA an ophthalmic technician. The creation of this record is the provider's dictation and/or activities during the visit.    Electronically signed by: San Jetty. Owens Shark, [place] 09.05.2023 9:27 AM  Gardiner Sleeper, M.D., Ph.D. Diseases & Surgery of the Retina and Vitreous [facility]  I have reviewed the above documentation for accuracy and completeness, and I agree with the above. Gardiner Sleeper, M.D., Ph.D. 04/04/22 9:31 AM   Abbreviations: M myopia (nearsighted); A astigmatism; H hyperopia (farsighted); P presbyopia; Mrx spectacle prescription;  CTL contact lenses; OD right eye; OS left eye; OU both eyes  XT exotropia; ET esotropia; PEK punctate epithelial keratitis; PEE punctate epithelial erosions; DES dry eye syndrome; MGD meibomian gland dysfunction; ATs artificial tears; PFAT's preservative free artificial tears; [place] nuclear sclerotic cataract; PSC posterior subcapsular cataract; ERM epi-retinal membrane; PVD posterior vitreous detachment; RD retinal detachment; DM diabetes mellitus; DR diabetic retinopathy; NPDR non-proliferative diabetic retinopathy; PDR proliferative diabetic retinopathy; CSME clinically significant macular edema; DME diabetic macular edema; dbh dot blot hemorrhages; CWS cotton wool spot;  POAG primary open angle glaucoma; C/D cup-to-disc ratio; HVF humphrey visual field; GVF goldmann visual field; OCT optical coherence tomography; IOP intraocular pressure; BRVO Branch retinal vein occlusion; CRVO central retinal vein occlusion; CRAO central retinal artery occlusion; BRAO branch retinal artery occlusion; RT retinal tear; SB scleral buckle; PPV pars plana vitrectomy; VH Vitreous hemorrhage; PRP panretinal laser photocoagulation; IVK intravitreal kenalog; VMT  vitreomacular traction; MH Macular hole;  NVD neovascularization of the disc; NVE neovascularization elsewhere; AREDS age related eye disease study; ARMD age related macular degeneration; POAG primary open angle glaucoma; EBMD epithelial/anterior basement membrane dystrophy; ACIOL anterior chamber intraocular lens; IOL intraocular lens; PCIOL posterior chamber intraocular lens; Phaco/IOL phacoemulsification with intraocular lens placement; [address] photorefractive keratectomy; LASIK laser assisted in situ keratomileusis; HTN hypertension; DM diabetes mellitus; COPD chronic obstructive pulmonary disease

## 2022-03-31 ENCOUNTER — Encounter (INDEPENDENT_AMBULATORY_CARE_PROVIDER_SITE_OTHER): Payer: Commercial Managed Care - HMO | Admitting: Ophthalmology

## 2022-04-04 ENCOUNTER — Encounter (INDEPENDENT_AMBULATORY_CARE_PROVIDER_SITE_OTHER): Payer: Self-pay | Admitting: Ophthalmology

## 2022-04-04 ENCOUNTER — Ambulatory Visit (INDEPENDENT_AMBULATORY_CARE_PROVIDER_SITE_OTHER): Payer: Commercial Managed Care - HMO | Admitting: Ophthalmology

## 2022-04-04 VITALS — BP 145/90 | HR 65

## 2022-04-04 DIAGNOSIS — H35033 Hypertensive retinopathy, bilateral: Secondary | ICD-10-CM | POA: Diagnosis not present

## 2022-04-04 DIAGNOSIS — I10 Essential (primary) hypertension: Secondary | ICD-10-CM

## 2022-04-04 DIAGNOSIS — H34831 Tributary (branch) retinal vein occlusion, right eye, with macular edema: Secondary | ICD-10-CM | POA: Diagnosis not present

## 2022-04-04 DIAGNOSIS — E113313 Type 2 diabetes mellitus with moderate nonproliferative diabetic retinopathy with macular edema, bilateral: Secondary | ICD-10-CM | POA: Diagnosis not present

## 2022-04-04 DIAGNOSIS — H269 Unspecified cataract: Secondary | ICD-10-CM

## 2022-04-04 MED ORDER — BEVACIZUMAB CHEMO INJECTION 1.25MG/0.05ML SYRINGE FOR KALEIDOSCOPE
1.2500 mg | INTRAVITREAL | Status: AC | PRN
  Administered 2022-04-04: 1.25 mg via INTRAVITREAL

## 2022-04-07 ENCOUNTER — Ambulatory Visit (INDEPENDENT_AMBULATORY_CARE_PROVIDER_SITE_OTHER): Payer: Commercial Managed Care - HMO | Admitting: Podiatry

## 2022-04-07 DIAGNOSIS — Z91199 Patient's noncompliance with other medical treatment and regimen due to unspecified reason: Secondary | ICD-10-CM

## 2022-04-07 NOTE — Progress Notes (Signed)
No show

## 2022-04-18 ENCOUNTER — Other Ambulatory Visit: Payer: Self-pay | Admitting: Internal Medicine

## 2022-04-18 DIAGNOSIS — E118 Type 2 diabetes mellitus with unspecified complications: Secondary | ICD-10-CM

## 2022-05-02 ENCOUNTER — Encounter (INDEPENDENT_AMBULATORY_CARE_PROVIDER_SITE_OTHER): Payer: Commercial Managed Care - HMO | Admitting: Ophthalmology

## 2022-05-02 DIAGNOSIS — E113313 Type 2 diabetes mellitus with moderate nonproliferative diabetic retinopathy with macular edema, bilateral: Secondary | ICD-10-CM

## 2022-05-02 DIAGNOSIS — H34831 Tributary (branch) retinal vein occlusion, right eye, with macular edema: Secondary | ICD-10-CM

## 2022-05-02 DIAGNOSIS — H35033 Hypertensive retinopathy, bilateral: Secondary | ICD-10-CM

## 2022-05-02 DIAGNOSIS — H269 Unspecified cataract: Secondary | ICD-10-CM

## 2022-05-02 DIAGNOSIS — I10 Essential (primary) hypertension: Secondary | ICD-10-CM

## 2022-05-18 ENCOUNTER — Other Ambulatory Visit: Payer: Self-pay | Admitting: Internal Medicine

## 2022-05-18 DIAGNOSIS — E118 Type 2 diabetes mellitus with unspecified complications: Secondary | ICD-10-CM

## 2022-05-22 ENCOUNTER — Ambulatory Visit (INDEPENDENT_AMBULATORY_CARE_PROVIDER_SITE_OTHER): Payer: Commercial Managed Care - HMO | Admitting: Emergency Medicine

## 2022-05-22 ENCOUNTER — Encounter: Payer: Self-pay | Admitting: Emergency Medicine

## 2022-05-22 VITALS — BP 154/96 | HR 64 | Temp 97.7°F | Ht 69.0 in | Wt 336.1 lb

## 2022-05-22 DIAGNOSIS — R051 Acute cough: Secondary | ICD-10-CM

## 2022-05-22 DIAGNOSIS — J22 Unspecified acute lower respiratory infection: Secondary | ICD-10-CM | POA: Insufficient documentation

## 2022-05-22 MED ORDER — BENZONATATE 200 MG PO CAPS: 200.0000 mg | ORAL_CAPSULE | Freq: Two times a day (BID) | ORAL | 0 refills | Status: DC | PRN

## 2022-05-22 MED ORDER — HYDROCODONE BIT-HOMATROP MBR 5-1.5 MG/5ML PO SOLN: 5.0000 mL | Freq: Every evening | ORAL | 0 refills | Status: DC | PRN

## 2022-05-22 MED ORDER — AZITHROMYCIN 250 MG PO TABS: ORAL_TABLET | ORAL | 0 refills | Status: DC

## 2022-05-22 MED ORDER — FLUCONAZOLE 150 MG PO TABS: 150.0000 mg | ORAL_TABLET | Freq: Once | ORAL | 0 refills | Status: AC

## 2022-05-22 NOTE — Patient Instructions (Signed)

## 2022-05-22 NOTE — Assessment & Plan Note (Signed)
Active and affecting quality of life. Continue over-the-counter Mucinex DM and cough drops Start Tessalon 200 mg 3 times a day and Hycodan syrup at bedtime. Advised to stay well-hydrated and rest.

## 2022-05-22 NOTE — Progress Notes (Shared)
[facility] Note  05/25/2022     CHIEF COMPLAINT Patient presents for No chief complaint on file.   HISTORY OF PRESENT ILLNESS: Derrick [name] is a 41 y.o. male who presents to the clinic today for:    Pt reports he has been unable to get in contact with PCP office, but has since restarted his BP meds.   Referring physician: Hoyt Koch, MD [place],  [place] [address]  HISTORICAL INFORMATION:   Selected notes from the MEDICAL RECORD NUMBER Referred by Dr. Lucianne Lei for concern of NPDR LEE:  Ocular Hx- PMH-    CURRENT MEDICATIONS: No current outpatient medications on file. (Ophthalmic Drugs)   No current facility-administered medications for this visit. (Ophthalmic Drugs)   Current Outpatient Medications (Other)  Medication Sig   acetaminophen (TYLENOL) 325 MG tablet Take 2 tablets (650 mg total) by mouth every 6 (six) hours as needed for mild pain or headache (fever >/= 101).   albuterol (VENTOLIN HFA) 108 (90 Base) MCG/ACT inhaler Inhale 2 puffs into the lungs every 6 (six) hours as needed for wheezing or shortness of breath.   allopurinol (ZYLOPRIM) 300 MG tablet Take 1 tablet by mouth once daily   azelastine (ASTELIN) 0.1 % nasal spray Place 2 sprays into both nostrils 2 (two) times daily. Use in each nostril as directed   benzonatate (TESSALON) 200 MG capsule Take 1 capsule (200 mg total) by mouth 3 (three) times daily as needed.   clindamycin (CLINDAGEL) 1 % gel Apply 1 application topically daily.   dextromethorphan-guaiFENesin (MUCINEX DM) 30-600 MG 12hr tablet Take 1 tablet by mouth 2 (two) times daily.   doxycycline (VIBRA-TABS) 100 MG tablet Take 1 tablet (100 mg total) by mouth 2 (two) times daily.   fluconazole (DIFLUCAN) 150 MG tablet Take 1 tablet (150 mg total) by mouth every 3 (three) days.   fluticasone (FLONASE) 50 MCG/ACT nasal spray Place 1 spray into both nostrils daily.   fluticasone (FLOVENT  HFA) 110 MCG/ACT inhaler Inhale 2 puffs into the lungs 2 (two) times daily. (Patient taking differently: Inhale 2 puffs into the lungs 2 (two) times daily as needed (shortness of breath).)   furosemide (LASIX) 80 MG tablet Take 2 tablets (160 mg total) by mouth 3 (three) times daily as needed.   gabapentin (NEURONTIN) 300 MG capsule TAKE 3 CAPSULES BY MOUTH THREE TIMES DAILY   levocetirizine (XYZAL) 5 MG tablet Take 1 tablet (5 mg total) by mouth every evening.   metoCLOPramide (REGLAN) 5 MG/5ML solution Take 10 mg by mouth 4 (four) times daily.   metoprolol tartrate (LOPRESSOR) 50 MG tablet Take 1 tablet (50 mg total) by mouth 2 (two) times daily.   montelukast (SINGULAIR) 10 MG tablet Take 1 tablet (10 mg total) by mouth at bedtime.   nystatin (MYCOSTATIN/NYSTOP) powder Apply 1 application. topically 3 (three) times daily.   omeprazole (PRILOSEC) 40 MG capsule Take 40 mg by mouth daily.   ondansetron (ZOFRAN) 4 MG tablet Take 1 tablet (4 mg total) by mouth every 8 (eight) hours as needed for nausea or vomiting.   ONETOUCH DELICA LANCETS 33I MISC Use as needed daily   potassium chloride (KLOR-CON) 20 MEQ packet Take 20 mEq by mouth daily.   sildenafil (VIAGRA) 100 MG tablet Take 1 tablet (100 mg total) by mouth as needed for erectile dysfunction.   Spacer/Aero-Holding Chambers (PRO COMFORT SPACER ADULT) MISC UAD with inhaler   ursodiol (ACTIGALL) 300 MG capsule  Take 300 mg by mouth 2 (two) times daily.   Vitamin D, Ergocalciferol, (DRISDOL) 1.25 MG (50000 UNIT) CAPS capsule Take 1 capsule (50,000 Units total) by mouth every 7 (seven) days.   No current facility-administered medications for this visit. (Other)   REVIEW OF SYSTEMS:    ALLERGIES Allergies  Allergen Reactions   Vancomycin Other (See Comments)    Affected kidneys    Shrimp [Shellfish Allergy]    Zosyn [Piperacillin Sod-Tazobactam So] Other (See Comments)   PAST MEDICAL HISTORY Past Medical History:  Diagnosis Date    Acute on chronic diastolic CHF (congestive heart failure), NYHA class 1 (HCC)    Diabetes mellitus without complication (HCC)    GERD (gastroesophageal reflux disease)    Hypertension    Hypoxemia    Morbid obesity (HCC)    Obesity hypoventilation syndrome (HCC)    OSA (obstructive sleep apnea)    Reflux    Past Surgical History:  Procedure Laterality Date   TRACHEOSTOMY TUBE PLACEMENT N/A 06/21/2015   Procedure: TRACHEOSTOMY;  Surgeon: Newman Pies, MD;  Location: [facility] OR;  Service: ENT;  Laterality: N/A;   FAMILY HISTORY Family History  Problem Relation Age of Onset   Diabetes Maternal Grandmother    Hypertension Maternal Grandmother    Asthma Other    Cancer Other    Stroke Other    Heart disease Other    SOCIAL HISTORY Social History   Tobacco Use   Smoking status: Never   Smokeless tobacco: Never  Substance Use Topics   Alcohol use: No   Drug use: No       OPHTHALMIC EXAM:  Not recorded     IMAGING AND PROCEDURES  Imaging and Procedures for 05/25/2022          ASSESSMENT/PLAN:    ICD-10-CM   1. Branch retinal vein occlusion of right eye with macular edema  H34.8310     2. Moderate nonproliferative diabetic retinopathy of both eyes with macular edema associated with type 2 diabetes mellitus (HCC)  V37.1062     3. Essential hypertension  I10     4. Hypertensive retinopathy of both eyes  H35.033     5. Cortical cataract of both eyes  H26.9       BRVO w/ CME OD - s/p IVA OD #1 (09.05.23) - BCVA 20/100 from 20/300 - OCT shows OD: Mild interval improvement in IRF centrally and superior macula; OS: Mild interval improvement in IRF temporal macula and fovea  - recommend IVA OD #2 today, 10.26.23 - pt wishes to proceed with injection - RBA of procedure discussed, questions answered - IVA informed consent obtained and signed, 09.05.23 (OU) - see procedure note - F/U 4 weeks, DFE, OCT, FA (transit OD) and possible injection  2. Moderate  Non-proliferative diabetic retinopathy OU - exam shows scattered MA OU - OCT shows +cystic changes / diabetic macular edema, left eye  - BCVA 20/20 OS - will monitor for now  3,4. Hypertensive retinopathy OU  - BP in office improved today to 145/90 from 184/111  - review of records shows BP yesterday at [facility] office was 140/80  - pt asymptomatic -- no headache, chest pain, palpitations, numbness/tingling - discussed importance of tight BP control - recommend contacting PCP to discuss BP, potentially restart meds  5. Cortical Cataract OU - The symptoms of cataract, surgical options, and treatments and risks were discussed with patient. - discussed diagnosis and progression - not yet visually significant - monitor for now  Ophthalmic  Meds Ordered this visit:  No orders of the defined types were placed in this encounter.    No follow-ups on file.  There are no Patient Instructions on file for this visit.   Explained the diagnoses, plan, and follow up with the patient and they expressed understanding.  Patient expressed understanding of the importance of proper follow up care.   This document serves as a record of services personally performed by Karie Chimera, MD, PhD. It was created on their behalf by Glee Arvin. Manson Passey, OA an ophthalmic technician. The creation of this record is the provider's dictation and/or activities during the visit.    Electronically signed by: Glee Arvin. Manson Passey, [place] 10.23.2023 9:10 AM   Karie Chimera, M.D., Ph.D. Diseases & Surgery of the Retina and Vitreous [facility]     Abbreviations: M myopia (nearsighted); A astigmatism; H hyperopia (farsighted); P presbyopia; Mrx spectacle prescription;  CTL contact lenses; OD right eye; OS left eye; OU both eyes  XT exotropia; ET esotropia; PEK punctate epithelial keratitis; PEE punctate epithelial erosions; DES dry eye syndrome; MGD meibomian gland dysfunction; ATs artificial tears; PFAT's  preservative free artificial tears; NSC nuclear sclerotic cataract; PSC posterior subcapsular cataract; ERM epi-retinal membrane; PVD posterior vitreous detachment; RD retinal detachment; DM diabetes mellitus; DR diabetic retinopathy; NPDR non-proliferative diabetic retinopathy; PDR proliferative diabetic retinopathy; CSME clinically significant macular edema; DME diabetic macular edema; dbh dot blot hemorrhages; CWS cotton wool spot; POAG primary open angle glaucoma; C/D cup-to-disc ratio; HVF humphrey visual field; GVF goldmann visual field; OCT optical coherence tomography; IOP intraocular pressure; BRVO Branch retinal vein occlusion; CRVO central retinal vein occlusion; CRAO central retinal artery occlusion; BRAO branch retinal artery occlusion; RT retinal tear; SB scleral buckle; PPV pars plana vitrectomy; VH Vitreous hemorrhage; PRP panretinal laser photocoagulation; IVK intravitreal kenalog; VMT vitreomacular traction; MH Macular hole;  NVD neovascularization of the disc; NVE neovascularization elsewhere; AREDS age related eye disease study; ARMD age related macular degeneration; POAG primary open angle glaucoma; EBMD epithelial/anterior basement membrane dystrophy; ACIOL anterior chamber intraocular lens; IOL intraocular lens; PCIOL posterior chamber intraocular lens; Phaco/IOL phacoemulsification with intraocular lens placement; PRK photorefractive keratectomy; LASIK laser assisted in situ keratomileusis; HTN hypertension; DM diabetes mellitus; COPD chronic obstructive pulmonary disease

## 2022-05-22 NOTE — Assessment & Plan Note (Signed)
Viral upper respiratory infection with secondary bacterial infection.  No signs of pneumonia. Will benefit from daily azithromycin for 5 days.

## 2022-05-22 NOTE — Progress Notes (Signed)
Derrick [name] 41 y.o.   Chief Complaint  Patient presents with  . Cough    Ongoing cough , sinus infection about 2 weeks ago     HISTORY OF PRESENT ILLNESS: Acute problem visit today.  Patient of Dr. Hillard Danker. This is a 41 y.o. male complaining of persistent cough and congestion for the past 2 weeks Denies fever or chills.  Denies difficulty breathing. No other associated symptoms. No other complaints or medical concerns today.  Cough Pertinent negatives include no chest pain, chills, fever, headaches, rash or shortness of breath.     Prior to Admission medications   Medication Sig Start Date End Date Taking? Authorizing Provider  acetaminophen (TYLENOL) 325 MG tablet Take 2 tablets (650 mg total) by mouth every 6 (six) hours as needed for mild pain or headache (fever >/= 101). 06/21/19  Yes Lonia Blood, MD  albuterol (VENTOLIN HFA) 108 (90 Base) MCG/ACT inhaler Inhale 2 puffs into the lungs every 6 (six) hours as needed for wheezing or shortness of breath. 09/23/21  Yes Pincus Sanes, MD  allopurinol (ZYLOPRIM) 300 MG tablet Take 1 tablet by mouth once daily 06/27/21  Yes Myrlene Broker, MD  azelastine (ASTELIN) 0.1 % nasal spray Place 2 sprays into both nostrils 2 (two) times daily. Use in each nostril as directed 11/15/21  Yes Myrlene Broker, MD  benzonatate (TESSALON) 200 MG capsule Take 1 capsule (200 mg total) by mouth 3 (three) times daily as needed. 11/15/21  Yes Myrlene Broker, MD  clindamycin (CLINDAGEL) 1 % gel Apply 1 application topically daily. 06/10/19  Yes [provider]  dextromethorphan-guaiFENesin (MUCINEX DM) 30-600 MG 12hr tablet Take 1 tablet by mouth 2 (two) times daily. 11/15/21  Yes Myrlene Broker, MD  doxycycline (VIBRA-TABS) 100 MG tablet Take 1 tablet (100 mg total) by mouth 2 (two) times daily. 11/22/21  Yes Myrlene Broker, MD  fluconazole (DIFLUCAN) 150 MG tablet Take 1 tablet (150 mg total)  by mouth every 3 (three) days. 11/22/21  Yes Myrlene Broker, MD  fluticasone [facility]) 50 MCG/ACT nasal spray Place 1 spray into both nostrils daily. 11/15/21  Yes Myrlene Broker, MD  fluticasone (FLOVENT HFA) 110 MCG/ACT inhaler Inhale 2 puffs into the lungs 2 (two) times daily. Patient taking differently: Inhale 2 puffs into the lungs 2 (two) times daily as needed (shortness of breath). 07/30/18  Yes Myrlene Broker, MD  furosemide (LASIX) 80 MG tablet Take 2 tablets (160 mg total) by mouth 3 (three) times daily as needed. 11/13/19  Yes Myrlene Broker, MD  gabapentin (NEURONTIN) 300 MG capsule TAKE 3 CAPSULES BY MOUTH THREE TIMES DAILY 04/18/22  Yes Myrlene Broker, MD  levocetirizine (XYZAL) 5 MG tablet Take 1 tablet (5 mg total) by mouth every evening. 11/15/21  Yes Myrlene Broker, MD  metoCLOPramide (REGLAN) 5 MG/5ML solution Take 10 mg by mouth 4 (four) times daily. 03/10/20  Yes [provider]  metoprolol tartrate (LOPRESSOR) 50 MG tablet Take 1 tablet (50 mg total) by mouth 2 (two) times daily. 09/23/21  Yes Burns, Bobette Mo, MD  montelukast (SINGULAIR) 10 MG tablet Take 1 tablet (10 mg total) by mouth at bedtime. 11/15/21  Yes Myrlene Broker, MD  nystatin (MYCOSTATIN/NYSTOP) powder Apply 1 application. topically 3 (three) times daily. 11/15/21  Yes Myrlene Broker, MD  omeprazole (PRILOSEC) 40 MG capsule Take 40 mg by mouth daily. 03/08/20  Yes [provider]  ondansetron (ZOFRAN) 4  MG tablet Take 1 tablet (4 mg total) by mouth every 8 (eight) hours as needed for nausea or vomiting. 06/24/19  Yes Hoyt Koch, MD  [facility] DELICA LANCETS 24M MISC Use as needed daily 11/30/16  Yes Hoyt Koch, MD  potassium chloride (KLOR-CON) 20 MEQ packet Take 20 mEq by mouth daily. 11/13/19  Yes Hoyt Koch, MD  sildenafil (VIAGRA) 100 MG tablet Take 1 tablet (100 mg total) by mouth as needed for erectile dysfunction.  11/15/21  Yes Hoyt Koch, MD  Spacer/Aero-Holding Chambers (PRO COMFORT SPACER ADULT) MISC UAD with inhaler 09/23/21  Yes Binnie Rail, MD  ursodiol (ACTIGALL) 300 MG capsule Take 300 mg by mouth 2 (two) times daily. 03/19/20  Yes [provider]  Vitamin D, Ergocalciferol, (DRISDOL) 1.25 MG (50000 UNIT) CAPS capsule Take 1 capsule (50,000 Units total) by mouth every 7 (seven) days. 11/21/21  Yes Hoyt Koch, MD    Allergies  Allergen Reactions  . Vancomycin Other (See Comments)    Affected kidneys   . Shrimp [Shellfish Allergy]   . Zosyn [Piperacillin Sod-Tazobactam So] Other (See Comments)    Patient Active Problem List   Diagnosis Date Noted  . B12 deficiency 11/15/2021  . Rash 04/13/2021  . Left leg swelling 11/24/2019  . Chronic respiratory failure with hypoxia ([place]) 06/17/2019  . Chronic pain syndrome 06/17/2019  . Sinusitis 07/30/2018  . Vitamin D deficiency 11/12/2017  . Tinnitus 09/27/2017  . Numbness and tingling of hand 11/30/2016  . Tracheostomy dependent ([place])   . Routine general medical examination at a health care facility 09/21/2016  . Acne 09/21/2016  . ED (erectile dysfunction) 06/02/2016  . Tracheostomy status ([place])   . Other allergic rhinitis   . Diabetes mellitus type 2 with complications ([place]) 35/36/1443  . Dysphagia   . Pulmonary hypertension ([place])   . Morbid obesity ([place]) 06/13/2015  . Essential hypertension 06/13/2015  . OSA (obstructive sleep apnea) 06/13/2015  . Obesity hypoventilation syndrome ([place]) 06/13/2015  . Chronic diastolic heart failure ([place]) 06/13/2015    Past Medical History:  Diagnosis Date  . Acute on chronic diastolic CHF (congestive heart failure), NYHA class 1 ([place])   . Diabetes mellitus without complication ([place])   . GERD (gastroesophageal reflux disease)   . Hypertension   . Hypoxemia   . Morbid obesity ([place])   . Obesity hypoventilation syndrome ([place])   . OSA (obstructive sleep apnea)   .  Reflux     Past Surgical History:  Procedure Laterality Date  . TRACHEOSTOMY TUBE PLACEMENT N/A 06/21/2015   Procedure: TRACHEOSTOMY;  Surgeon: Leta Baptist, MD;  Location: [facility] OR;  Service: ENT;  Laterality: N/A;    Social History   Socioeconomic History  . Marital status: Single    Spouse name: Not on file  . Number of children: Not on file  . Years of education: Not on file  . Highest education level: Not on file  Occupational History  . Not on file  Tobacco Use  . Smoking status: Never  . Smokeless tobacco: Never  Substance and Sexual Activity  . Alcohol use: No  . Drug use: No  . Sexual activity: Not on file  Other Topics Concern  . Not on file  Social History Narrative  . Not on file   Social Determinants of Health   Financial Resource Strain: Not on file  Food Insecurity: Not on file  Transportation Needs: Not on file  Physical Activity: Not on file  Stress:  Not on file  Social Connections: Not on file  Intimate Partner Violence: Not on file    Family History  Problem Relation Age of Onset  . Diabetes Maternal Grandmother   . Hypertension Maternal Grandmother   . Asthma Other   . Cancer Other   . Stroke Other   . Heart disease Other      Review of Systems  Constitutional: Negative.  Negative for chills and fever.  HENT:  Positive for congestion.   Respiratory:  Positive for cough. Negative for shortness of breath.   Cardiovascular: Negative.  Negative for chest pain and palpitations.  Gastrointestinal:  Negative for abdominal pain, diarrhea, nausea and vomiting.  Genitourinary: Negative.   Skin: Negative.  Negative for rash.  Neurological: Negative.  Negative for dizziness and headaches.  All other systems reviewed and are negative.  Today's Vitals   05/22/22 1442 05/22/22 1447  BP: (!) 156/98 (!) 154/96  Pulse: 64   Temp: 97.7 F (36.5 C)   TempSrc: Oral   SpO2: 93%   Weight: (!) 336 lb 2 oz (152.5 kg)   Height: 5\' 9"  (1.753 m)    Body mass  index is 49.64 kg/m.   Physical Exam Vitals reviewed.  Constitutional:      Appearance: Normal appearance. He is obese.  HENT:     Head: Normocephalic.     Mouth/Throat:     Mouth: Mucous membranes are moist.     Pharynx: Oropharynx is clear.  Eyes:     Extraocular Movements: Extraocular movements intact.     Pupils: Pupils are equal, round, and reactive to light.  Cardiovascular:     Rate and Rhythm: Normal rate and regular rhythm.     Pulses: Normal pulses.     Heart sounds: Normal heart sounds.  Pulmonary:     Effort: Pulmonary effort is normal.     Breath sounds: Normal breath sounds.  Musculoskeletal:        General: Normal range of motion.     Cervical back: No tenderness.  Lymphadenopathy:     Cervical: No cervical adenopathy.  Skin:    General: Skin is warm and dry.  Neurological:     General: No focal deficit present.     Mental Status: He is alert and oriented to person, place, and time.  Psychiatric:        Mood and Affect: Mood normal.        Behavior: Behavior normal.     ASSESSMENT & PLAN: Problem List Items Addressed This Visit       Respiratory   Lower respiratory infection    Viral upper respiratory infection with secondary bacterial infection.  No signs of pneumonia. Will benefit from daily azithromycin for 5 days.      Relevant Medications   azithromycin (ZITHROMAX) 250 MG tablet     Other   Acute cough - Primary    Active and affecting quality of life. Continue over-the-counter Mucinex DM and cough drops Start Tessalon 200 mg 3 times a day and Hycodan syrup at bedtime. Advised to stay well-hydrated and rest.      Relevant Medications   benzonatate (TESSALON) 200 MG capsule   HYDROcodone bit-homatropine (HYCODAN) 5-1.5 MG/5ML syrup   Patient Instructions  Cough, Adult A cough helps to clear your throat and lungs. A cough may be a sign of an illness or another medical condition. An acute cough may only last 2-3 weeks, while a  chronic cough may last 8 or more weeks.  Many things can cause a cough. They include: Germs (viruses or bacteria) that attack the airway. Breathing in things that bother (irritate) your lungs. Allergies. Asthma. Mucus that runs down the back of your throat (postnasal drip). Smoking. Acid backing up from the stomach into the tube that moves food from the mouth to the stomach (gastroesophageal reflux). Some medicines. Lung problems. Other medical conditions, such as heart failure or a blood clot in the lung (pulmonary embolism). Follow these instructions at home: Medicines Take over-the-counter and prescription medicines only as told by your doctor. Talk with your doctor before you take medicines that stop a cough (cough suppressants). Lifestyle  Do not smoke, and try not to be around smoke. Do not use any products that contain nicotine or tobacco, such as cigarettes, e-cigarettes, and chewing tobacco. If you need help quitting, ask your doctor. Drink enough fluid to keep your pee (urine) pale yellow. Avoid caffeine. Do not drink alcohol if your doctor tells you not to drink. General instructions  Watch for any changes in your cough. Tell your doctor about them. Always cover your mouth when you cough. Stay away from things that make you cough, such as perfume, candles, campfire smoke, or cleaning products. If the air is dry, use a cool mist vaporizer or humidifier in your home. If your cough is worse at night, try using extra pillows to raise your head up higher while you sleep. Rest as needed. Keep all follow-up visits as told by your doctor. This is important. Contact a doctor if: You have new symptoms. You cough up pus. Your cough does not get better after 2-3 weeks, or your cough gets worse. Cough medicine does not help your cough and you are not sleeping well. You have pain that gets worse or pain that is not helped with medicine. You have a fever. You are losing weight and  you do not know why. You have night sweats. Get help right away if: You cough up blood. You have trouble breathing. Your heartbeat is very fast. These symptoms may be an emergency. Do not wait to see if the symptoms will go away. Get medical help right away. Call your local emergency services (911 in the [place] Do not drive yourself to the hospital. Summary A cough helps to clear your throat and lungs. Many things can cause a cough. Take over-the-counter and prescription medicines only as told by your doctor. Always cover your mouth when you cough. Contact a doctor if you have new symptoms or you have a cough that does not get better or gets worse. This information is not intended to replace advice given to you by your health care provider. Make sure you discuss any questions you have with your health care provider. Document Revised: 09/05/2019 Document Reviewed: 08/05/2018 Elsevier Patient Education  2023 Elsevier Inc.     Edwina Barth, MD [facility]

## 2022-05-25 ENCOUNTER — Encounter (INDEPENDENT_AMBULATORY_CARE_PROVIDER_SITE_OTHER): Payer: Commercial Managed Care - HMO | Admitting: Ophthalmology

## 2022-05-25 DIAGNOSIS — H269 Unspecified cataract: Secondary | ICD-10-CM

## 2022-05-25 DIAGNOSIS — H34831 Tributary (branch) retinal vein occlusion, right eye, with macular edema: Secondary | ICD-10-CM

## 2022-05-25 DIAGNOSIS — H35033 Hypertensive retinopathy, bilateral: Secondary | ICD-10-CM

## 2022-05-25 DIAGNOSIS — I10 Essential (primary) hypertension: Secondary | ICD-10-CM

## 2022-05-25 DIAGNOSIS — E113313 Type 2 diabetes mellitus with moderate nonproliferative diabetic retinopathy with macular edema, bilateral: Secondary | ICD-10-CM

## 2022-05-25 NOTE — Progress Notes (Shared)
[facility] Note  05/29/2022     CHIEF COMPLAINT Patient presents for No chief complaint on file.   HISTORY OF PRESENT ILLNESS: Derrick [name] is a 41 y.o. male who presents to the clinic today for:    Pt reports he has been unable to get in contact with PCP office, but has since restarted his BP meds.   Referring physician: Hoyt Koch, MD [place],  [place] [id number]  HISTORICAL INFORMATION:   Selected notes from the MEDICAL RECORD NUMBER Referred by Dr. Lucianne Lei for concern of NPDR LEE:  Ocular Hx- PMH-    CURRENT MEDICATIONS: No current outpatient medications on file. (Ophthalmic Drugs)   No current facility-administered medications for this visit. (Ophthalmic Drugs)   Current Outpatient Medications (Other)  Medication Sig   acetaminophen (TYLENOL) 325 MG tablet Take 2 tablets (650 mg total) by mouth every 6 (six) hours as needed for mild pain or headache (fever >/= 101).   albuterol (VENTOLIN HFA) 108 (90 Base) MCG/ACT inhaler Inhale 2 puffs into the lungs every 6 (six) hours as needed for wheezing or shortness of breath.   allopurinol (ZYLOPRIM) 300 MG tablet Take 1 tablet by mouth once daily   azelastine (ASTELIN) 0.1 % nasal spray Place 2 sprays into both nostrils 2 (two) times daily. Use in each nostril as directed   azithromycin (ZITHROMAX) 250 MG tablet Sig as indicated   benzonatate (TESSALON) 200 MG capsule Take 1 capsule (200 mg total) by mouth 2 (two) times daily as needed for cough.   clindamycin (CLINDAGEL) 1 % gel Apply 1 application topically daily.   dextromethorphan-guaiFENesin (MUCINEX DM) 30-600 MG 12hr tablet Take 1 tablet by mouth 2 (two) times daily.   doxycycline (VIBRA-TABS) 100 MG tablet Take 1 tablet (100 mg total) by mouth 2 (two) times daily.   fluconazole (DIFLUCAN) 150 MG tablet Take 1 tablet (150 mg total) by mouth every 3 (three) days.   fluticasone (FLONASE) 50 MCG/ACT nasal spray  Place 1 spray into both nostrils daily.   fluticasone (FLOVENT HFA) 110 MCG/ACT inhaler Inhale 2 puffs into the lungs 2 (two) times daily. (Patient taking differently: Inhale 2 puffs into the lungs 2 (two) times daily as needed (shortness of breath).)   furosemide (LASIX) 80 MG tablet Take 2 tablets (160 mg total) by mouth 3 (three) times daily as needed.   gabapentin (NEURONTIN) 300 MG capsule TAKE 3 CAPSULES BY MOUTH THREE TIMES DAILY   HYDROcodone bit-homatropine (HYCODAN) 5-1.5 MG/5ML syrup Take 5 mLs by mouth at bedtime as needed for cough.   levocetirizine (XYZAL) 5 MG tablet Take 1 tablet (5 mg total) by mouth every evening.   metoCLOPramide (REGLAN) 5 MG/5ML solution Take 10 mg by mouth 4 (four) times daily.   metoprolol tartrate (LOPRESSOR) 50 MG tablet Take 1 tablet (50 mg total) by mouth 2 (two) times daily.   montelukast (SINGULAIR) 10 MG tablet Take 1 tablet (10 mg total) by mouth at bedtime.   nystatin (MYCOSTATIN/NYSTOP) powder Apply 1 application. topically 3 (three) times daily.   omeprazole (PRILOSEC) 40 MG capsule Take 40 mg by mouth daily.   ondansetron (ZOFRAN) 4 MG tablet Take 1 tablet (4 mg total) by mouth every 8 (eight) hours as needed for nausea or vomiting.   ONETOUCH DELICA LANCETS 53G MISC Use as needed daily   potassium chloride (KLOR-CON) 20 MEQ packet Take 20 mEq by mouth daily.   sildenafil (VIAGRA) 100 MG tablet Take  1 tablet (100 mg total) by mouth as needed for erectile dysfunction.   Spacer/Aero-Holding Chambers (PRO COMFORT SPACER ADULT) MISC UAD with inhaler   ursodiol (ACTIGALL) 300 MG capsule Take 300 mg by mouth 2 (two) times daily.   Vitamin D, Ergocalciferol, (DRISDOL) 1.25 MG (50000 UNIT) CAPS capsule Take 1 capsule (50,000 Units total) by mouth every 7 (seven) days.   No current facility-administered medications for this visit. (Other)   REVIEW OF SYSTEMS:    ALLERGIES Allergies  Allergen Reactions   Vancomycin Other (See Comments)     Affected kidneys    Shrimp [Shellfish Allergy]    Zosyn [Piperacillin Sod-Tazobactam So] Other (See Comments)   PAST MEDICAL HISTORY Past Medical History:  Diagnosis Date   Acute on chronic diastolic CHF (congestive heart failure), NYHA class 1 (HCC)    Diabetes mellitus without complication (HCC)    GERD (gastroesophageal reflux disease)    Hypertension    Hypoxemia    Morbid obesity (HCC)    Obesity hypoventilation syndrome (HCC)    OSA (obstructive sleep apnea)    Reflux    Past Surgical History:  Procedure Laterality Date   TRACHEOSTOMY TUBE PLACEMENT N/A 06/21/2015   Procedure: TRACHEOSTOMY;  Surgeon: Leta Baptist, MD;  Location: [facility] OR;  Service: ENT;  Laterality: N/A;   FAMILY HISTORY Family History  Problem Relation Age of Onset   Diabetes Maternal Grandmother    Hypertension Maternal Grandmother    Asthma Other    Cancer Other    Stroke Other    Heart disease Other    SOCIAL HISTORY Social History   Tobacco Use   Smoking status: Never   Smokeless tobacco: Never  Substance Use Topics   Alcohol use: No   Drug use: No       OPHTHALMIC EXAM:  Not recorded     IMAGING AND PROCEDURES  Imaging and Procedures for 05/29/2022          ASSESSMENT/PLAN:    ICD-10-CM   1. Branch retinal vein occlusion of right eye with macular edema  H34.8310     2. Moderate nonproliferative diabetic retinopathy of both eyes with macular edema associated with type 2 diabetes mellitus ([place])  [id number]     3. Essential hypertension  I10     4. Hypertensive retinopathy of both eyes  H35.033     5. Cortical cataract of both eyes  H26.9       BRVO w/ CME OD  - s/p IVA OD #1 (09.05.23) - BCVA 20/100 from 20/300 - OCT shows OD: Mild interval improvement in IRF centrally and superior macula; OS: Mild interval improvement in IRF temporal macula and fovea  - recommend IVA OD #2 today, 10.30.23 - pt wishes to proceed with injection - RBA of procedure discussed, questions  answered - IVA informed consent obtained and signed, 09.05.23 (OU) - see procedure note - F/U 4 weeks, DFE, OCT, FA (transit OD) and possible injection  2. Moderate Non-proliferative diabetic retinopathy OU - exam shows scattered MA OU - OCT shows +cystic changes / diabetic macular edema, left eye  - BCVA 20/20 OS - will monitor for now  3,4. Hypertensive retinopathy OU  - BP in office improved today to 145/90 from 184/111  - review of records shows BP yesterday at [facility] office was 140/80  - pt asymptomatic -- no headache, chest pain, palpitations, numbness/tingling - discussed importance of tight BP control - recommend contacting PCP to discuss BP, potentially restart meds  5. Cortical Cataract  OU - The symptoms of cataract, surgical options, and treatments and risks were discussed with patient. - discussed diagnosis and progression - not yet visually significant - monitor for now  Ophthalmic Meds Ordered this visit:  No orders of the defined types were placed in this encounter.    No follow-ups on file.  There are no Patient Instructions on file for this visit.   Explained the diagnoses, plan, and follow up with the patient and they expressed understanding.  Patient expressed understanding of the importance of proper follow up care.   This document serves as a record of services personally performed by Gardiner Sleeper, MD, PhD. It was created on their behalf by San Jetty. Owens Shark, OA an ophthalmic technician. The creation of this record is the provider's dictation and/or activities during the visit.    Electronically signed by: San Jetty. Owens Shark, [place] 10.26.2023 12:01 PM   Gardiner Sleeper, M.D., Ph.D. Diseases & Surgery of the Retina and Vitreous [facility]: M myopia (nearsighted); A astigmatism; H hyperopia (farsighted); P presbyopia; Mrx spectacle prescription;  CTL contact lenses; OD right eye; OS left eye; OU both eyes  XT  exotropia; ET esotropia; PEK punctate epithelial keratitis; PEE punctate epithelial erosions; DES dry eye syndrome; MGD meibomian gland dysfunction; ATs artificial tears; PFAT's preservative free artificial tears; [place] nuclear sclerotic cataract; PSC posterior subcapsular cataract; ERM epi-retinal membrane; PVD posterior vitreous detachment; RD retinal detachment; DM diabetes mellitus; DR diabetic retinopathy; NPDR non-proliferative diabetic retinopathy; PDR proliferative diabetic retinopathy; CSME clinically significant macular edema; DME diabetic macular edema; dbh dot blot hemorrhages; CWS cotton wool spot; POAG primary open angle glaucoma; C/D cup-to-disc ratio; HVF humphrey visual field; GVF goldmann visual field; OCT optical coherence tomography; IOP intraocular pressure; BRVO Branch retinal vein occlusion; CRVO central retinal vein occlusion; CRAO central retinal artery occlusion; BRAO branch retinal artery occlusion; RT retinal tear; SB scleral buckle; PPV pars plana vitrectomy; VH Vitreous hemorrhage; PRP panretinal laser photocoagulation; IVK intravitreal kenalog; VMT vitreomacular traction; MH Macular hole;  NVD neovascularization of the disc; NVE neovascularization elsewhere; AREDS age related eye disease study; ARMD age related macular degeneration; POAG primary open angle glaucoma; EBMD epithelial/anterior basement membrane dystrophy; ACIOL anterior chamber intraocular lens; IOL intraocular lens; PCIOL posterior chamber intraocular lens; Phaco/IOL phacoemulsification with intraocular lens placement; [place] photorefractive keratectomy; LASIK laser assisted in situ keratomileusis; HTN hypertension; DM diabetes mellitus; COPD chronic obstructive pulmonary disease

## 2022-05-29 ENCOUNTER — Encounter (INDEPENDENT_AMBULATORY_CARE_PROVIDER_SITE_OTHER): Payer: Commercial Managed Care - HMO | Admitting: Ophthalmology

## 2022-05-29 DIAGNOSIS — H269 Unspecified cataract: Secondary | ICD-10-CM

## 2022-05-29 DIAGNOSIS — I10 Essential (primary) hypertension: Secondary | ICD-10-CM

## 2022-05-29 DIAGNOSIS — H35033 Hypertensive retinopathy, bilateral: Secondary | ICD-10-CM

## 2022-05-29 DIAGNOSIS — E113313 Type 2 diabetes mellitus with moderate nonproliferative diabetic retinopathy with macular edema, bilateral: Secondary | ICD-10-CM

## 2022-05-29 DIAGNOSIS — H34831 Tributary (branch) retinal vein occlusion, right eye, with macular edema: Secondary | ICD-10-CM

## 2022-05-30 ENCOUNTER — Ambulatory Visit: Payer: Commercial Managed Care - HMO | Admitting: Internal Medicine

## 2022-06-06 ENCOUNTER — Ambulatory Visit: Payer: Commercial Managed Care - HMO | Admitting: Internal Medicine

## 2022-06-13 ENCOUNTER — Encounter: Payer: Self-pay | Admitting: Internal Medicine

## 2022-06-13 ENCOUNTER — Ambulatory Visit (INDEPENDENT_AMBULATORY_CARE_PROVIDER_SITE_OTHER): Payer: Commercial Managed Care - HMO | Admitting: Internal Medicine

## 2022-06-13 VITALS — BP 180/100 | HR 72 | Temp 98.0°F | Ht 69.0 in | Wt 347.0 lb

## 2022-06-13 DIAGNOSIS — E559 Vitamin D deficiency, unspecified: Secondary | ICD-10-CM

## 2022-06-13 DIAGNOSIS — E538 Deficiency of other specified B group vitamins: Secondary | ICD-10-CM

## 2022-06-13 DIAGNOSIS — I5032 Chronic diastolic (congestive) heart failure: Secondary | ICD-10-CM

## 2022-06-13 DIAGNOSIS — I10 Essential (primary) hypertension: Secondary | ICD-10-CM

## 2022-06-13 MED ORDER — POTASSIUM CHLORIDE 20 MEQ PO PACK: 20.0000 meq | PACK | Freq: Every day | ORAL | 2 refills | Status: DC | PRN

## 2022-06-13 MED ORDER — VITAMIN D (ERGOCALCIFEROL) 1.25 MG (50000 UNIT) PO CAPS: 50000.0000 [IU] | ORAL_CAPSULE | ORAL | 3 refills | Status: DC

## 2022-06-13 MED ORDER — VITAMIN B-12 1000 MCG PO TABS: 1000.0000 ug | ORAL_TABLET | Freq: Every day | ORAL | 3 refills | Status: AC

## 2022-06-13 MED ORDER — METOPROLOL TARTRATE 50 MG PO TABS: 50.0000 mg | ORAL_TABLET | Freq: Two times a day (BID) | ORAL | 3 refills | Status: DC

## 2022-06-13 MED ORDER — CONTRAVE 8-90 MG PO TB12: ORAL_TABLET | ORAL | 0 refills | Status: DC

## 2022-06-13 NOTE — Assessment & Plan Note (Signed)
Suspect with some fluid overload. Weight up 10 pounds in 3 weeks. Advised to take lasix 80 mg daily for 3-5 days and concurrent kdur 20 mEq daily with the lasix which is refilled today. No SOB on exertion.

## 2022-06-13 NOTE — Assessment & Plan Note (Signed)
Rx 50000 unit weekly vitamin D as this had previously helped and he is consistently low for years and is s/p gastric sleeve which will impact absorption.

## 2022-06-13 NOTE — Assessment & Plan Note (Signed)
Weight is up 30-35 pounds since April and he is struggling with this. Rx contrave to see if this is covered to help with appetite.

## 2022-06-13 NOTE — Assessment & Plan Note (Signed)
Rx B12 1000 mcg daily and is having some fatigue.

## 2022-06-13 NOTE — Patient Instructions (Addendum)
We have sent in contrave to take for the weight loss to help with appetite.   We have sent in the high dose vitamin D to take 1 pill weekly ongoing.  We have sent in B12 pills to take 1 pill daily.  Take the lasix for a few days to see if there is extra fluid. We have refilled the potassium.

## 2022-06-13 NOTE — Assessment & Plan Note (Signed)
BP is markedly elevated today. Schedule is off with taking metoprolol 50 mg BID and refilled this today. Suspect some fluid overload and asked him to take lasix 80 mg daily for 3-5 days. If BP does not go back down we can adjust regimen.

## 2022-06-13 NOTE — Progress Notes (Signed)
   Subjective:   Patient ID: Derrick [name], male    DOB: October 31, 1980, 41 y.o.   MRN: [id number]  HPI The patient is a 41 YO man coming in for follow up. Weight up 10 pounds since 3 weeks ago without diet changes. Has not taken lasix lately.   Review of Systems  Constitutional:  Positive for appetite change and unexpected weight change. Negative for activity change.  HENT: Negative.    Eyes: Negative.   Respiratory:  Positive for cough. Negative for chest tightness and shortness of breath.   Cardiovascular:  Negative for chest pain, palpitations and leg swelling.  Gastrointestinal:  Negative for abdominal distention, abdominal pain, constipation, diarrhea, nausea and vomiting.  Musculoskeletal: Negative.   Skin: Negative.   Neurological: Negative.   Psychiatric/Behavioral: Negative.      Objective:  Physical Exam Constitutional:      Appearance: He is well-developed. He is obese.  HENT:     Head: Normocephalic and atraumatic.  Cardiovascular:     Rate and Rhythm: Normal rate and regular rhythm.  Pulmonary:     Effort: Pulmonary effort is normal. No respiratory distress.     Breath sounds: Normal breath sounds. No wheezing or rales.     Comments: Janina Mayo covered not examined Abdominal:     General: Bowel sounds are normal. There is no distension.     Palpations: Abdomen is soft.     Tenderness: There is no abdominal tenderness. There is no rebound.  Musculoskeletal:     Cervical back: Normal range of motion.  Skin:    General: Skin is warm and dry.  Neurological:     Mental Status: He is alert and oriented to person, place, and time.     Coordination: Coordination normal.     Vitals:   06/13/22 0919 06/13/22 0925  BP: (!) 180/100 (!) 180/100  Pulse: 72   Temp: 98 F (36.7 C)   TempSrc: Oral   SpO2: 90%   Weight: (!) 347 lb (157.4 kg)   Height: 5\' 9"  (1.753 m)     Assessment & Plan:

## 2022-07-05 ENCOUNTER — Encounter: Payer: Self-pay | Admitting: Family Medicine

## 2022-07-05 ENCOUNTER — Telehealth (INDEPENDENT_AMBULATORY_CARE_PROVIDER_SITE_OTHER): Payer: Commercial Managed Care - HMO | Admitting: Family Medicine

## 2022-07-05 VITALS — Temp 97.0°F | Ht 69.0 in | Wt 347.0 lb

## 2022-07-05 DIAGNOSIS — R051 Acute cough: Secondary | ICD-10-CM

## 2022-07-05 DIAGNOSIS — R509 Fever, unspecified: Secondary | ICD-10-CM

## 2022-07-05 MED ORDER — FLUCONAZOLE 150 MG PO TABS: 150.0000 mg | ORAL_TABLET | ORAL | 0 refills | Status: DC

## 2022-07-05 MED ORDER — DOXYCYCLINE HYCLATE 100 MG PO TABS: 100.0000 mg | ORAL_TABLET | Freq: Two times a day (BID) | ORAL | 0 refills | Status: DC

## 2022-07-05 NOTE — Progress Notes (Signed)
Patient ID: Derrick [name], male   DOB: Aug 12, 1980, 41 y.o.   MRN: [id number]   Virtual Visit via Video Note  I connected with Derrick [name] on 07/05/22 at  5:15 PM EST by a video enabled telemedicine application and verified that I am speaking with the correct person using two identifiers.  Location patient: home Location provider:work or home office Persons participating in the virtual visit: patient, provider  I discussed the limitations of evaluation and management by telemedicine and the availability of in person appointments. The patient expressed understanding and agreed to proceed.   HPI:  Derrick [name] called with onset Monday of a febrile illness.  He had cough for couple days and had 102 temperature Monday.  Temperature today has been 99.9.  Home COVID test negative.  He had some associated nasal congestion.  Chills.  Intermittent mild diarrhea.  No bloody stools.  No nausea or vomiting.  He feels like he may have picked up a bug after visiting his 92-year-old son.  He does have history of gastric sleeve surgery.  His other medical problems include hypertension, chronic diastolic heart failure, pulmonary hypertension, obstructive sleep apnea, past history of diabetes.   ROS: See pertinent positives and negatives per HPI.  Past Medical History:  Diagnosis Date   Acute on chronic diastolic CHF (congestive heart failure), NYHA class 1 (HCC)    Diabetes mellitus without complication (HCC)    GERD (gastroesophageal reflux disease)    Hypertension    Hypoxemia    Morbid obesity (HCC)    Obesity hypoventilation syndrome (HCC)    OSA (obstructive sleep apnea)    Reflux     Past Surgical History:  Procedure Laterality Date   TRACHEOSTOMY TUBE PLACEMENT N/A 06/21/2015   Procedure: TRACHEOSTOMY;  Surgeon: Newman Pies, MD;  Location: [facility] OR;  Service: ENT;  Laterality: N/A;    Family History  Problem Relation Age of Onset   Diabetes Maternal Grandmother    Hypertension  Maternal Grandmother    Asthma Other    Cancer Other    Stroke Other    Heart disease Other     SOCIAL HX: Non-smoker   Current Outpatient Medications:    acetaminophen (TYLENOL) 325 MG tablet, Take 2 tablets (650 mg total) by mouth every 6 (six) hours as needed for mild pain or headache (fever >/= 101)., Disp:  , Rfl:    albuterol (VENTOLIN HFA) 108 (90 Base) MCG/ACT inhaler, Inhale 2 puffs into the lungs every 6 (six) hours as needed for wheezing or shortness of breath., Disp: 1 each, Rfl: 0   allopurinol (ZYLOPRIM) 300 MG tablet, Take 1 tablet by mouth once daily, Disp: 90 tablet, Rfl: 1   azelastine (ASTELIN) 0.1 % nasal spray, Place 2 sprays into both nostrils 2 (two) times daily. Use in each nostril as directed, Disp: 30 mL, Rfl: 12   clindamycin (CLINDAGEL) 1 % gel, Apply 1 application topically daily., Disp: , Rfl:    cyanocobalamin (VITAMIN B12) 1000 MCG tablet, Take 1 tablet (1,000 mcg total) by mouth daily., Disp: 90 tablet, Rfl: 3   dextromethorphan-guaiFENesin (MUCINEX DM) 30-600 MG 12hr tablet, Take 1 tablet by mouth 2 (two) times daily., Disp: 60 tablet, Rfl: 6   fluticasone (FLONASE) 50 MCG/ACT nasal spray, Place 1 spray into both nostrils daily., Disp: 16 g, Rfl: 11   fluticasone (FLOVENT HFA) 110 MCG/ACT inhaler, Inhale 2 puffs into the lungs 2 (two) times daily. (Patient taking differently: Inhale 2 puffs into the lungs 2 (two) times  daily as needed (shortness of breath).), Disp: 1 Inhaler, Rfl: 2   furosemide (LASIX) 80 MG tablet, Take 2 tablets (160 mg total) by mouth 3 (three) times daily as needed., Disp: 180 tablet, Rfl: 11   gabapentin (NEURONTIN) 300 MG capsule, TAKE 3 CAPSULES BY MOUTH THREE TIMES DAILY, Disp: 270 capsule, Rfl: 0   HYDROcodone bit-homatropine (HYCODAN) 5-1.5 MG/5ML syrup, Take 5 mLs by mouth at bedtime as needed for cough., Disp: 120 mL, Rfl: 0   levocetirizine (XYZAL) 5 MG tablet, Take 1 tablet (5 mg total) by mouth every evening., Disp: 90 tablet,  Rfl: 3   metoCLOPramide (REGLAN) 5 MG/5ML solution, Take 10 mg by mouth 4 (four) times daily., Disp: , Rfl:    metoprolol tartrate (LOPRESSOR) 50 MG tablet, Take 1 tablet (50 mg total) by mouth 2 (two) times daily., Disp: 180 tablet, Rfl: 3   montelukast (SINGULAIR) 10 MG tablet, Take 1 tablet (10 mg total) by mouth at bedtime., Disp: 90 tablet, Rfl: 3   Naltrexone-buPROPion HCl ER (CONTRAVE) 8-90 MG TB12, Start 1 tablet every morning for 7 days, then 1 tablet twice daily for 7 days, then 2 tablets every morning and one every evening, Disp: 120 tablet, Rfl: 0   nystatin (MYCOSTATIN/NYSTOP) powder, Apply 1 application. topically 3 (three) times daily., Disp: 120 g, Rfl: 11   omeprazole (PRILOSEC) 40 MG capsule, Take 40 mg by mouth daily., Disp: , Rfl:    ondansetron (ZOFRAN) 4 MG tablet, Take 1 tablet (4 mg total) by mouth every 8 (eight) hours as needed for nausea or vomiting., Disp: 20 tablet, Rfl: 0   ONETOUCH DELICA LANCETS 33G MISC, Use as needed daily, Disp: 100 each, Rfl: 11   potassium chloride (KLOR-CON) 20 MEQ packet, Take 20 mEq by mouth daily as needed (take with lasix only)., Disp: 30 packet, Rfl: 2   sildenafil (VIAGRA) 100 MG tablet, Take 1 tablet (100 mg total) by mouth as needed for erectile dysfunction., Disp: 10 tablet, Rfl: 3   Spacer/Aero-Holding Chambers (PRO COMFORT SPACER ADULT) MISC, UAD with inhaler, Disp: 1 each, Rfl: 0   ursodiol (ACTIGALL) 300 MG capsule, Take 300 mg by mouth 2 (two) times daily., Disp: , Rfl:    Vitamin D, Ergocalciferol, (DRISDOL) 1.25 MG (50000 UNIT) CAPS capsule, Take 1 capsule (50,000 Units total) by mouth every 7 (seven) days., Disp: 12 capsule, Rfl: 3   doxycycline (VIBRA-TABS) 100 MG tablet, Take 1 tablet (100 mg total) by mouth 2 (two) times daily., Disp: 14 tablet, Rfl: 0   fluconazole (DIFLUCAN) 150 MG tablet, Take 1 tablet (150 mg total) by mouth every 3 (three) days., Disp: 2 tablet, Rfl: 0  EXAM:  VITALS per patient if  applicable:  GENERAL: alert, oriented, appears well and in no acute distress  HEENT: atraumatic, conjunttiva clear, no obvious abnormalities on inspection of external nose and ears  NECK: normal movements of the head and neck  LUNGS: on inspection no signs of respiratory distress, breathing rate appears normal, no obvious gross SOB, gasping or wheezing  CV: no obvious cyanosis  MS: moves all visible extremities without noticeable abnormality  PSYCH/NEURO: pleasant and cooperative, no obvious depression or anxiety, speech and thought processing grossly intact  ASSESSMENT AND PLAN:  Discussed the following assessment and plan:  Patient relates 3-day history of cough and fever.  COVID test negative.  Does not have any significant myalgias or headache which makes influenza somewhat less likely.  He does have multiple comorbidities as above.  -Plenty of fluids  and rest -We did decide to go and cover with doxycycline 100 mg twice daily for 7 days.  Patient also requesting fluconazole as he has history of yeast balanitis frequently in the past. -We recommend in person/in office evaluation if not improving over the next couple days.  Follow-up sooner for any increased dyspnea or other concerns -Recommend if possible to get pulse oximeter to monitor for low O2 sats     I discussed the assessment and treatment plan with the patient. The patient was provided an opportunity to ask questions and all were answered. The patient agreed with the plan and demonstrated an understanding of the instructions.   The patient was advised to call back or seek an in-person evaluation if the symptoms worsen or if the condition fails to improve as anticipated.     Evelena Peat, MD

## 2022-07-27 ENCOUNTER — Inpatient Hospital Stay (HOSPITAL_COMMUNITY)
Admission: RE | Admit: 2022-07-27 | Discharge: 2022-07-27 | Disposition: A | Discharge status: Home / Self Care | Payer: Commercial Managed Care - HMO | Source: Ambulatory Visit

## 2022-07-27 DIAGNOSIS — Z930 Tracheostomy status: Secondary | ICD-10-CM

## 2022-07-27 DIAGNOSIS — I5032 Chronic diastolic (congestive) heart failure: Secondary | ICD-10-CM | POA: Diagnosis present

## 2022-07-27 DIAGNOSIS — G4733 Obstructive sleep apnea (adult) (pediatric): Secondary | ICD-10-CM | POA: Diagnosis present

## 2022-07-27 NOTE — Assessment & Plan Note (Signed)
PSG 10/11/21 >> AHI 53.4, SpO2 low 68%.  CPAP 13 cm H2O >> AHI 0, +R, +S, didn't need oxygen.  Trach capped.

## 2022-07-27 NOTE — Progress Notes (Signed)
Reason for visit  Trach change  HPI 41 year old male well known to me. Trach dep 2/2 severe OSA & OHS, MO, s/p gastric sleeve and diastolic HF. I haven't seen him since June this year. Last time I saw him he was having some trouble w/ trach malpositioning but there was nothing actually wrong w/ his stoma. I had also left messages for him to get his CPAP as he had yet to receive it. In August visit w/ Craige Cotta looks like he had CPAP but was awaiting supplies. I see a overall concern over the last 4 months has been on-going weight gain. He presents today for planned trach change.   ROS  Exam   Trach change  Active Problems:   Tracheostomy status (HCC)   Morbid obesity (HCC)   OSA (obstructive sleep apnea)   Chronic diastolic heart failure (HCC)  Tracheostomy status (HCC) Overview: Trach model:  Brand: Portex  Bivona  60A160 Size:  6.0/ cuffless Last change:   His PSG was back   Plan   OSA (obstructive sleep apnea) Assessment & Plan: PSG 10/11/21 >> AHI 53.4, SpO2 low 68%.  CPAP 13 cm H2O >> AHI 0, +R, +S, didn't need oxygen.  Trach capped.

## 2022-08-01 ENCOUNTER — Inpatient Hospital Stay (HOSPITAL_COMMUNITY): Admission: RE | Admit: 2022-08-01 | Payer: Commercial Managed Care - HMO | Source: Ambulatory Visit

## 2022-08-23 ENCOUNTER — Other Ambulatory Visit: Payer: Self-pay | Admitting: Radiology

## 2022-08-23 DIAGNOSIS — J069 Acute upper respiratory infection, unspecified: Secondary | ICD-10-CM

## 2022-08-23 DIAGNOSIS — E118 Type 2 diabetes mellitus with unspecified complications: Secondary | ICD-10-CM

## 2022-08-23 MED ORDER — FUROSEMIDE 80 MG PO TABS: 160.0000 mg | ORAL_TABLET | Freq: Three times a day (TID) | ORAL | 11 refills | Status: DC | PRN

## 2022-08-23 MED ORDER — NYSTATIN 100000 UNIT/GM EX POWD: 1.0000 | Freq: Three times a day (TID) | CUTANEOUS | 11 refills | Status: DC

## 2022-08-23 MED ORDER — MONTELUKAST SODIUM 10 MG PO TABS: 10.0000 mg | ORAL_TABLET | Freq: Every day | ORAL | 3 refills | Status: AC

## 2022-08-23 MED ORDER — ALLOPURINOL 300 MG PO TABS: 300.0000 mg | ORAL_TABLET | Freq: Every day | ORAL | 3 refills | Status: DC

## 2022-08-23 MED ORDER — ALBUTEROL SULFATE HFA 108 (90 BASE) MCG/ACT IN AERS: 2.0000 | INHALATION_SPRAY | Freq: Four times a day (QID) | RESPIRATORY_TRACT | 0 refills | Status: DC | PRN

## 2022-08-23 MED ORDER — FLUTICASONE PROPIONATE 50 MCG/ACT NA SUSP: 1.0000 | Freq: Every day | NASAL | 11 refills | Status: DC

## 2022-08-23 MED ORDER — METOPROLOL TARTRATE 50 MG PO TABS: 50.0000 mg | ORAL_TABLET | Freq: Two times a day (BID) | ORAL | 3 refills | Status: DC

## 2022-08-24 MED ORDER — GABAPENTIN 300 MG PO CAPS: 900.0000 mg | ORAL_CAPSULE | Freq: Three times a day (TID) | ORAL | 5 refills | Status: DC

## 2022-08-24 MED ORDER — VITAMIN D (ERGOCALCIFEROL) 1.25 MG (50000 UNIT) PO CAPS: 50000.0000 [IU] | ORAL_CAPSULE | ORAL | 3 refills | Status: DC

## 2022-09-14 ENCOUNTER — Ambulatory Visit (INDEPENDENT_AMBULATORY_CARE_PROVIDER_SITE_OTHER): Payer: Commercial Managed Care - HMO | Admitting: Nurse Practitioner

## 2022-09-14 ENCOUNTER — Encounter: Payer: Self-pay | Admitting: Nurse Practitioner

## 2022-09-14 VITALS — BP 152/84 | HR 75 | Temp 98.4°F | Ht 69.0 in | Wt 342.0 lb

## 2022-09-14 DIAGNOSIS — R0981 Nasal congestion: Secondary | ICD-10-CM | POA: Diagnosis not present

## 2022-09-14 DIAGNOSIS — J0100 Acute maxillary sinusitis, unspecified: Secondary | ICD-10-CM

## 2022-09-14 LAB — POC COVID19 BINAXNOW: SARS Coronavirus 2 Ag: NEGATIVE

## 2022-09-14 MED ORDER — DOXYCYCLINE HYCLATE 100 MG PO TABS: 100.0000 mg | ORAL_TABLET | Freq: Two times a day (BID) | ORAL | 0 refills | Status: DC

## 2022-09-14 MED ORDER — AZELASTINE HCL 0.1 % NA SOLN: 2.0000 | Freq: Two times a day (BID) | NASAL | 1 refills | Status: DC

## 2022-09-14 NOTE — Progress Notes (Signed)
Acute Office Visit  Subjective:     Patient ID: Derrick [name], male    DOB: [birth year], 42 y.o.   MRN: [id number]  Chief Complaint  Patient presents with   Nasal Congestion    Nose stopped up    HPI Patient is in today for nasal congestion that started about 5-6 days ago.   UPPER RESPIRATORY TRACT INFECTION  Fever: no Cough: yes Shortness of breath: yes Wheezing: no Chest pain: no Chest tightness: no Chest congestion: yes Nasal congestion: yes Runny nose: yes Post nasal drip: yes Sneezing: yes Sore throat: no Swollen glands: no Sinus pressure: no Headache: yes Face pain: no Toothache: no Ear pain: no bilateral Ear pressure: no bilateral Eyes red/itching:no Eye drainage/crusting: no  Vomiting: no Rash: no Fatigue: yes Sick contacts: no Strep contacts: no  Context: worse Recurrent sinusitis: no Relief with OTC cold/cough medications: no  Treatments attempted: flonase, singulair, xyzal, sudafed  ROS See pertinent positives and negatives per HPI.     Objective:    BP (!) 152/84 (BP Location: Right Wrist, Cuff Size: Normal)   Pulse 75   Temp 98.4 F (36.9 C)   Ht 5' 9"$  (1.753 m)   Wt (!) 342 lb (155.1 kg)   SpO2 98%   BMI 50.50 kg/m    Physical Exam Vitals and nursing note reviewed.  Constitutional:      Appearance: Normal appearance.  HENT:     Head: Normocephalic.     Right Ear: Tympanic membrane, ear canal and external ear normal.     Left Ear: Tympanic membrane, ear canal and external ear normal.     Nose:     Right Sinus: Maxillary sinus tenderness present.     Left Sinus: Maxillary sinus tenderness present.  Eyes:     Conjunctiva/sclera: Conjunctivae normal.  Cardiovascular:     Rate and Rhythm: Normal rate and regular rhythm.     Pulses: Normal pulses.     Heart sounds: Normal heart sounds.  Pulmonary:     Effort: Pulmonary effort is normal.     Breath sounds: Normal breath sounds.  Musculoskeletal:     Cervical back:  Normal range of motion and neck supple. No tenderness.  Lymphadenopathy:     Cervical: No cervical adenopathy.  Skin:    General: Skin is warm.  Neurological:     General: No focal deficit present.     Mental Status: He is alert and oriented to person, place, and time.  Psychiatric:        Mood and Affect: Mood normal.        Behavior: Behavior normal.        Thought Content: Thought content normal.        Judgment: Judgment normal.     Results for orders placed or performed in visit on 09/14/22  POC COVID-19 BinaxNow  Result Value Ref Range   SARS Coronavirus 2 Ag Negative Negative        Assessment & Plan:   Problem List Items Addressed This Visit       Respiratory   Sinusitis - Primary   Relevant Medications   azelastine (ASTELIN) 0.1 % nasal spray   doxycycline (VIBRA-TABS) 100 MG tablet   Other Visit Diagnoses     Nasal congestion       POC COVID-negative.  Will treat for sinus infection as noted above.  Azelastine refill sent to the pharmacy as he states this nasal spray helps with congestion   Relevant Orders  POC COVID-19 BinaxNow (Completed)       Meds ordered this encounter  Medications   azelastine (ASTELIN) 0.1 % nasal spray    Sig: Place 2 sprays into both nostrils 2 (two) times daily. Use in each nostril as directed    Dispense:  30 mL    Refill:  1   doxycycline (VIBRA-TABS) 100 MG tablet    Sig: Take 1 tablet (100 mg total) by mouth 2 (two) times daily.    Dispense:  20 tablet    Refill:  0    Return if symptoms worsen or fail to improve.  Charyl Dancer, NP

## 2022-09-14 NOTE — Patient Instructions (Signed)
It was great to see you!  Start doxycycline 1 tablet twice a day for 10 days. I have also refilled your azelastine nasal spray.   Make sure you are drinking fluids.   Let's follow-up if your symptoms worsen or don't improve.   Take care,  Vance Peper, NP

## 2022-11-10 ENCOUNTER — Ambulatory Visit (HOSPITAL_COMMUNITY)
Admission: RE | Admit: 2022-11-10 | Discharge: 2022-11-10 | Disposition: A | Discharge status: Home / Self Care | Payer: Commercial Managed Care - HMO | Source: Ambulatory Visit | Attending: Acute Care | Admitting: Acute Care

## 2022-11-10 DIAGNOSIS — Z930 Tracheostomy status: Secondary | ICD-10-CM | POA: Diagnosis not present

## 2022-11-10 DIAGNOSIS — G4733 Obstructive sleep apnea (adult) (pediatric): Secondary | ICD-10-CM | POA: Diagnosis present

## 2022-11-10 DIAGNOSIS — I5032 Chronic diastolic (congestive) heart failure: Secondary | ICD-10-CM | POA: Diagnosis present

## 2022-11-10 NOTE — Progress Notes (Signed)
Reason for visit  Trach change  HPI 42 year old male well known to me. Trach dep 2/2 severe OSA & OHS, MO, s/p gastric sleeve and diastolic HF. I follow him for tracheostomy dependence.  I see is now following with [facility] for weight loss he is recently supposed to start on Mounjaro and injections, however he is currently waiting for insurance.  Presents today for planned tracheostomy change.  Review of Systems  Constitutional: Negative.   All other systems reviewed and are negative.   Exam  General pleasant ambulatory 42 year old male patient currently in no acute distress HEENT normocephalic atraumatic no jugular venous distention appreciated.  His tracheostomy is midline, able to speak with PMV valve in place, no evidence of air trapping Cardiac: Regular rate and rhythm Abdomen: Soft nontender Extremities: Warm dry brisk cap refill Neuro: Intact.  Trach change His size 6 Portex Bivona tracheostomy was removed, tracheostomy stoma inspected and was found to be unremarkable.  Following this a new #6 Bivona was placed over obturator the patient tolerated this well.  Active Problems:   Tracheostomy status   Morbid obesity   OSA (obstructive sleep apnea)   Chronic diastolic heart failure  Tracheostomy status Overview: Trach model:  Brand: Portex  Bivona  60A160 Size:  6.0/ cuffless Last change: 4/12  His PSG was back in march.  He has been using his home CPAP from before. He has already shown he no longer needs trach w/ successful capping trial; as long as he has CPAP at HS.   Plan Continuing routine tracheostomy change every 6 to 8 weeks

## 2022-11-22 ENCOUNTER — Encounter: Payer: Commercial Managed Care - HMO | Admitting: Internal Medicine

## 2023-03-07 ENCOUNTER — Encounter: Payer: Commercial Managed Care - HMO | Admitting: Internal Medicine

## 2023-03-20 ENCOUNTER — Encounter: Payer: Commercial Managed Care - HMO | Admitting: Internal Medicine

## 2023-04-03 ENCOUNTER — Encounter: Payer: Commercial Managed Care - HMO | Admitting: Internal Medicine

## 2023-04-05 ENCOUNTER — Encounter: Payer: Self-pay | Admitting: Internal Medicine

## 2023-04-18 ENCOUNTER — Encounter: Payer: Self-pay | Admitting: Pharmacist

## 2023-04-24 ENCOUNTER — Telehealth: Payer: Self-pay | Admitting: Internal Medicine

## 2023-04-24 NOTE — Telephone Encounter (Signed)
Sig has not changed and is still appropriate.

## 2023-04-24 NOTE — Telephone Encounter (Signed)
Derrick [name] calling from [facility] in [place] wanting clarification on Rx furosemide (LASIX) 80 MG tablet.  Phamarcy wanting to know if the dosage is correct She felt like the pt is taking a lot medication. [phone]

## 2023-04-26 NOTE — Telephone Encounter (Signed)
Spoke with pharmacy and explained that this is correct

## 2023-04-27 LAB — LIPID PANEL
Cholesterol: 144 (ref 0–200)
HDL: 53 (ref 35–70)
LDL Cholesterol: 66
Triglycerides: 149 (ref 40–160)

## 2023-04-27 LAB — CBC AND DIFFERENTIAL: Hemoglobin: 5.7 — AB (ref 13.5–17.5)

## 2023-04-30 ENCOUNTER — Telehealth: Payer: Self-pay | Admitting: Internal Medicine

## 2023-04-30 ENCOUNTER — Ambulatory Visit: Payer: Commercial Managed Care - HMO | Admitting: Internal Medicine

## 2023-04-30 NOTE — Telephone Encounter (Signed)
His last visit was 05/2022 so referral cannot be done from that.

## 2023-04-30 NOTE — Telephone Encounter (Signed)
I have informed pt.

## 2023-04-30 NOTE — Telephone Encounter (Signed)
Pt had to rescheduled appt due to running behind. Pt was inquiring about referral to an Urologist in [place]. Pt mentioned that him and the Doctor spoke about this at the last OV

## 2023-05-15 ENCOUNTER — Encounter: Payer: Commercial Managed Care - HMO | Admitting: Internal Medicine

## 2023-05-31 ENCOUNTER — Ambulatory Visit (INDEPENDENT_AMBULATORY_CARE_PROVIDER_SITE_OTHER): Payer: Managed Care, Other (non HMO) | Admitting: Internal Medicine

## 2023-05-31 ENCOUNTER — Encounter: Payer: Self-pay | Admitting: Internal Medicine

## 2023-05-31 VITALS — BP 146/82 | HR 69 | Temp 98.3°F | Ht 69.0 in | Wt 359.0 lb

## 2023-05-31 DIAGNOSIS — Z930 Tracheostomy status: Secondary | ICD-10-CM

## 2023-05-31 DIAGNOSIS — I5032 Chronic diastolic (congestive) heart failure: Secondary | ICD-10-CM

## 2023-05-31 DIAGNOSIS — Z8639 Personal history of other endocrine, nutritional and metabolic disease: Secondary | ICD-10-CM | POA: Diagnosis not present

## 2023-05-31 DIAGNOSIS — N528 Other male erectile dysfunction: Secondary | ICD-10-CM

## 2023-05-31 DIAGNOSIS — R350 Frequency of micturition: Secondary | ICD-10-CM | POA: Diagnosis not present

## 2023-05-31 LAB — URINALYSIS, ROUTINE W REFLEX MICROSCOPIC
Bilirubin Urine: NEGATIVE
Hgb urine dipstick: NEGATIVE
Ketones, ur: NEGATIVE
Leukocytes,Ua: NEGATIVE
Nitrite: NEGATIVE
RBC / HPF: NONE SEEN (ref 0–?)
Specific Gravity, Urine: 1.01 (ref 1.000–1.030)
Total Protein, Urine: NEGATIVE
Urine Glucose: NEGATIVE
Urobilinogen, UA: 0.2 (ref 0.0–1.0)
WBC, UA: NONE SEEN (ref 0–?)
pH: 7 (ref 5.0–8.0)

## 2023-05-31 LAB — MICROALBUMIN / CREATININE URINE RATIO
Creatinine,U: 75.2 mg/dL
Microalb Creat Ratio: 0.9 mg/g (ref 0.0–30.0)
Microalb, Ur: <0.7 mg/dL (ref 0.0–1.9)

## 2023-05-31 LAB — PSA: PSA: 0.63 ng/mL (ref 0.10–4.00)

## 2023-05-31 MED ORDER — SEMAGLUTIDE-WEIGHT MANAGEMENT 1.7 MG/0.75ML ~~LOC~~ SOAJ: 1.7000 mg | SUBCUTANEOUS | 0 refills | Status: AC

## 2023-05-31 MED ORDER — SEMAGLUTIDE-WEIGHT MANAGEMENT 1 MG/0.5ML ~~LOC~~ SOAJ: 1.0000 mg | SUBCUTANEOUS | 0 refills | Status: AC

## 2023-05-31 MED ORDER — BUMETANIDE 0.5 MG PO TABS: 0.5000 mg | ORAL_TABLET | Freq: Every day | ORAL | 3 refills | Status: DC

## 2023-05-31 MED ORDER — SEMAGLUTIDE-WEIGHT MANAGEMENT 2.4 MG/0.75ML ~~LOC~~ SOAJ: 2.4000 mg | SUBCUTANEOUS | 0 refills | Status: AC

## 2023-05-31 MED ORDER — SEMAGLUTIDE-WEIGHT MANAGEMENT 0.5 MG/0.5ML ~~LOC~~ SOAJ: 0.5000 mg | SUBCUTANEOUS | 0 refills | Status: AC

## 2023-05-31 MED ORDER — SEMAGLUTIDE-WEIGHT MANAGEMENT 0.25 MG/0.5ML ~~LOC~~ SOAJ: 0.2500 mg | SUBCUTANEOUS | 0 refills | Status: AC

## 2023-05-31 NOTE — Progress Notes (Signed)
Subjective:   Patient ID: Derrick [name], male    DOB: 12/11/1980, 42 y.o.   MRN: [id number]  HPI The patient is a 42 YO man coming in for several concerns including urinary issues (burning and leaking) and weight (getting procedure in December to help would like to try glp-1) and fluid retention.  Review of Systems  Constitutional: Negative.   HENT: Negative.    Eyes: Negative.   Respiratory:  Negative for cough, chest tightness and shortness of breath.   Cardiovascular:  Negative for chest pain, palpitations and leg swelling.  Gastrointestinal:  Negative for abdominal distention, abdominal pain, constipation, diarrhea, nausea and vomiting.  Genitourinary:  Positive for dysuria.       Leaking  Musculoskeletal: Negative.   Skin: Negative.   Neurological: Negative.   Psychiatric/Behavioral: Negative.      Objective:  Physical Exam Constitutional:      Appearance: Derrick [name] is well-developed. Derrick [name] is obese.  HENT:     Head: Normocephalic and atraumatic.  Cardiovascular:     Rate and Rhythm: Normal rate and regular rhythm.  Pulmonary:     Effort: Pulmonary effort is normal. No respiratory distress.     Breath sounds: Normal breath sounds. No wheezing or rales.  Abdominal:     General: Bowel sounds are normal. There is no distension.     Palpations: Abdomen is soft.     Tenderness: There is no abdominal tenderness. There is no rebound.  Musculoskeletal:     Cervical back: Normal range of motion.  Skin:    General: Skin is warm and dry.     Comments: Foot exam done  Neurological:     Mental Status: Derrick [name] is alert and oriented to person, place, and time.     Coordination: Coordination normal.     Vitals:   05/31/23 0820 05/31/23 0828  BP: (!) 146/82 (!) 146/82  Pulse: 69   Temp: 98.3 F (36.8 C)   TempSrc: Oral   SpO2: 97%   Weight: (!) 359 lb (162.8 kg)   Height: 5\' 9"  (1.753 m)     Assessment & Plan:

## 2023-06-01 DIAGNOSIS — R350 Frequency of micturition: Secondary | ICD-10-CM | POA: Insufficient documentation

## 2023-06-01 NOTE — Assessment & Plan Note (Signed)
Checking U/A and PSA to rule out prostate and infection. Does not desire/need STI screening.

## 2023-06-01 NOTE — Assessment & Plan Note (Signed)
Stable and no trach issues gets changed routinely.

## 2023-06-01 NOTE — Assessment & Plan Note (Signed)
Having more issues and rx viagra to help. He will think about if he wants to see urology. Checking PSA.

## 2023-06-01 NOTE — Assessment & Plan Note (Signed)
Checking microalbumin to creatinine ratio. Advised that likely his insurance company would not consider him diabetic as he is on no medication and last Hga1c 5.7 due to weight loss. Foot exam done with stable neuropathy.

## 2023-06-01 NOTE — Assessment & Plan Note (Addendum)
Getting further weight loss procedure later this year as weight is increasing. Will be at moderate risk for malnutrition after this. Rx wegovy to start in the meantime if covered.

## 2023-06-01 NOTE — Assessment & Plan Note (Signed)
No flare clearly today and weight is increasing. Will stop lasix and try bumex 0.5 mg daily. Recent renal panel stable from care everywhere.

## 2023-07-19 ENCOUNTER — Encounter: Payer: Self-pay | Admitting: Internal Medicine

## 2023-07-19 ENCOUNTER — Ambulatory Visit: Payer: Commercial Managed Care - HMO | Admitting: Internal Medicine

## 2023-07-19 VITALS — BP 160/100 | HR 68 | Temp 97.8°F | Ht 69.0 in | Wt 361.0 lb

## 2023-07-19 DIAGNOSIS — J069 Acute upper respiratory infection, unspecified: Secondary | ICD-10-CM

## 2023-07-19 LAB — POC COVID19 BINAXNOW: SARS Coronavirus 2 Ag: NEGATIVE

## 2023-07-19 LAB — POCT INFLUENZA A/B
Influenza A, POC: NEGATIVE
Influenza B, POC: NEGATIVE

## 2023-07-19 LAB — POCT RAPID STREP A (OFFICE): Rapid Strep A Screen: NEGATIVE

## 2023-07-19 MED ORDER — LEVOCETIRIZINE DIHYDROCHLORIDE 5 MG PO TABS: 5.0000 mg | ORAL_TABLET | Freq: Every evening | ORAL | 3 refills | Status: DC

## 2023-07-19 MED ORDER — AZELASTINE HCL 0.1 % NA SOLN: 2.0000 | Freq: Two times a day (BID) | NASAL | 1 refills | Status: DC

## 2023-07-19 NOTE — Assessment & Plan Note (Signed)
POC flu, strep and covid-19 all done and negative. Rx azelastine and xyzal to help with symptoms. Likely viral and given expectation and timeline for improvement.

## 2023-07-19 NOTE — Progress Notes (Signed)
   Subjective:   Patient ID: Derrick [name], male    DOB: [birth year], 42 y.o.   MRN: [id number]  HPI The patient is a 42 YO coming in for congestion. No problems with trach. Started Saturday with sore throat. No fevers or chills.   Review of Systems  Constitutional:  Positive for activity change and appetite change. Negative for fatigue, fever and unexpected weight change.  HENT:  Positive for congestion, postnasal drip, rhinorrhea and sore throat. Negative for ear discharge, ear pain, sinus pressure, sinus pain, sneezing, tinnitus, trouble swallowing and voice change.   Eyes: Negative.   Respiratory:  Negative for cough, chest tightness, shortness of breath and wheezing.   Cardiovascular: Negative.   Gastrointestinal: Negative.   Musculoskeletal:  Positive for myalgias.  Neurological: Negative.     Objective:  Physical Exam Constitutional:      Appearance: He is well-developed.  HENT:     Head: Normocephalic and atraumatic.     Comments: Oropharynx with redness and clear drainage, nose with swollen turbinates, TMs normal bilaterally.  Neck:     Thyroid: No thyromegaly.  Cardiovascular:     Rate and Rhythm: Normal rate and regular rhythm.  Pulmonary:     Effort: Pulmonary effort is normal. No respiratory distress.     Breath sounds: Normal breath sounds. No wheezing or rales.  Abdominal:     Palpations: Abdomen is soft.  Musculoskeletal:        General: No tenderness.     Cervical back: Normal range of motion.  Lymphadenopathy:     Cervical: No cervical adenopathy.  Skin:    General: Skin is warm and dry.  Neurological:     Mental Status: He is alert and oriented to person, place, and time.     Vitals:   07/19/23 0904 07/19/23 0911  BP: (!) 160/100 (!) 160/100  Pulse: 68   Temp: 97.8 F (36.6 C)   TempSrc: Oral   SpO2: 97%   Weight: (!) 361 lb (163.7 kg)   Height: 5\' 9"  (1.753 m)     Assessment & Plan:

## 2023-07-23 ENCOUNTER — Ambulatory Visit (HOSPITAL_COMMUNITY)
Admission: RE | Admit: 2023-07-23 | Discharge: 2023-07-23 | Disposition: A | Discharge status: Home / Self Care | Payer: Commercial Managed Care - HMO | Source: Ambulatory Visit | Attending: Acute Care | Admitting: Acute Care

## 2023-07-23 ENCOUNTER — Telehealth: Payer: Self-pay | Admitting: Acute Care

## 2023-07-23 DIAGNOSIS — Z930 Tracheostomy status: Secondary | ICD-10-CM | POA: Diagnosis not present

## 2023-07-23 DIAGNOSIS — G4733 Obstructive sleep apnea (adult) (pediatric): Secondary | ICD-10-CM | POA: Diagnosis present

## 2023-07-23 NOTE — Progress Notes (Signed)
Tracheostomy Procedure Note  Derrick [name] [id number] 01/06/1981  Pre Procedure Tracheostomy Information  Trach Brand: Portex Bivona Size:  60A160 Style: Uncuffed Secured by: Velcro   Procedure: Trach change    Post Procedure Tracheostomy Information  Trach Brand: Portex Bivona Size:  60A160 Style: Uncuffed Secured by: Velcro   Post Procedure Evaluation:  ETCO2 positive color change from yellow to purple : Yes.   Vital signs: stable Patients current condition: stable Complications: No apparent complications Trach site exam: clean, dry, no drainage Wound care done: dry, sterile, and 4 x 4 gauze Patient did tolerate procedure well.   Education: None  Prescription needs: None    Additional needs: PMV and red caps given.

## 2023-07-23 NOTE — Progress Notes (Signed)
Reason for visit  Trach change  HPI 42 year old male well known to me. Trach dep 2/2 severe OSA & OHS, MO, s/p gastric sleeve and diastolic HF. I follow him for tracheostomy dependence.  I see is now following with [facility] for weight loss and is scheduled for  SADI-S procedure towards end of January. He tolerates capping and already has a CPAP at home. We have been waiting to decannulate based on CPAP compliance as well as to ensure surgical procedures have been completed. In the mean time [name] returns to trach clinic for planned routine trach change  Review of Systems  Constitutional: Negative.   All other systems reviewed and are negative.   Exam  General this is a 42 year old male who is ambulatory and presents to trach clinic today for planned change HENT NCAT no JVD. Trach stoma w/out discharge excellent quality phonation Pulm clear  Card rrr Abd soft Ext warm and dry  Neuro intact   Trach change His size 6 Portex Bivona tracheostomy was removed, tracheostomy stoma inspected and was found to be unremarkable.  Following this a new #6 Bivona was placed over obturator he tolerated this well  Active Problems:   Morbid obesity (HCC)   OSA (obstructive sleep apnea)  Tracheostomy dependent [facility]) Assessment & Plan: Last trach change 12/23 Trach brand: size 6 portex bivona (Cuffless)  Discussion  [name] has a stable trach. He has had sig wt loss and his activity tolerance is excellent. Still has CPAP at home but tells me he hasn't been using it lately because he was at his mothers caring for her. She continues to demonstrate excellent tolerance to capping trials but did have brief rush of air on red cap removal  Plan  Cont routine trach care and changes Would not consider decannulation until we know his CPAP compliance is acceptable  I will see him in 8 weeks      My time 23 min

## 2023-07-23 NOTE — Assessment & Plan Note (Signed)
Last trach change 12/23 Trach brand: size 6 portex bivona (Cuffless)  Discussion  [name] has a stable trach. He has had sig wt loss and his activity tolerance is excellent. Still has CPAP at home but tells me he hasn't been using it lately because he was at his mothers caring for her. She continues to demonstrate excellent tolerance to capping trials but did have brief rush of air on red cap removal  Plan  Cont routine trach care and changes Would not consider decannulation until we know his CPAP compliance is acceptable  I will see him in 8 weeks

## 2023-08-07 ENCOUNTER — Ambulatory Visit (INDEPENDENT_AMBULATORY_CARE_PROVIDER_SITE_OTHER): Payer: Commercial Managed Care - HMO | Admitting: Family Medicine

## 2023-08-07 VITALS — BP 164/78 | HR 102 | Temp 97.6°F | Ht 69.0 in | Wt 353.8 lb

## 2023-08-07 DIAGNOSIS — I10 Essential (primary) hypertension: Secondary | ICD-10-CM | POA: Diagnosis not present

## 2023-08-07 DIAGNOSIS — J0100 Acute maxillary sinusitis, unspecified: Secondary | ICD-10-CM | POA: Diagnosis not present

## 2023-08-07 MED ORDER — AMOXICILLIN-POT CLAVULANATE 875-125 MG PO TABS: 1.0000 | ORAL_TABLET | Freq: Two times a day (BID) | ORAL | 0 refills | Status: DC

## 2023-08-07 MED ORDER — FLUCONAZOLE 150 MG PO TABS: 150.0000 mg | ORAL_TABLET | Freq: Every day | ORAL | 0 refills | Status: AC

## 2023-08-07 NOTE — Progress Notes (Signed)
 [facility]IMARY CARE [facility] [address] [place] [address] Dept: [phone] Dept Fax: [phone]  Office Visit  Subjective:    Patient ID: Derrick [name], male    DOB: 03-Sep-1980, 43 y.o..   MRN: [id number]  Chief Complaint  Patient presents with   Cough    C/o having chest congestion, Sinus, ST x 3 weeks.  Has taken Sudafed, NyQui.    History of Present Illness:  Patient is in today for with a 3-week complaint of upper respiratory symptoms. This has involved nasal congestion, rhinorrhea, and sore throat. His symptoms were improving, but then worsened again. He does admit to pressure over the sinuses. He denies fever or cough.  Past Medical History: Patient Active Problem List   Diagnosis Date Noted   Urinary frequency 06/01/2023   B12 deficiency 11/15/2021   Rash 04/13/2021   Left leg swelling 11/24/2019   Chronic respiratory failure with hypoxia (HCC) 06/17/2019   Chronic pain syndrome 06/17/2019   Vitamin D  deficiency 11/12/2017   Tinnitus 09/27/2017   Numbness and tingling of hand 11/30/2016   Tracheostomy dependent [facility])    Routine general medical examination at a health care facility 09/21/2016   Acne 09/21/2016   Viral URI 09/03/2016   ED (erectile dysfunction) 06/02/2016   Acute cough    Other allergic rhinitis    History of diabetes mellitus 08/11/2015   Dysphagia    Pulmonary hypertension (HCC)    Morbid obesity (HCC) 06/13/2015   Essential hypertension 06/13/2015   OSA (obstructive sleep apnea) 06/13/2015   Obesity hypoventilation syndrome (HCC) 06/13/2015   Chronic diastolic heart failure (HCC) 06/13/2015   Past Surgical History:  Procedure Laterality Date   TRACHEOSTOMY TUBE PLACEMENT N/A 06/21/2015   Procedure: TRACHEOSTOMY;  Surgeon: Daniel Moccasin, MD;  Location: [facility] OR;  Service: ENT;  Laterality: N/A;   Family History  Problem Relation Age of Onset   Diabetes Maternal Grandmother    Hypertension  Maternal Grandmother    Asthma Other    Cancer Other    Stroke Other    Heart disease Other    Outpatient Medications Prior to Visit  Medication Sig Dispense Refill   acetaminophen  (TYLENOL ) 325 MG tablet Take 2 tablets (650 mg total) by mouth every 6 (six) hours as needed for mild pain or headache (fever >/= 101).     albuterol  (VENTOLIN  HFA) 108 (90 Base) MCG/ACT inhaler Inhale 2 puffs into the lungs every 6 (six) hours as needed for wheezing or shortness of breath. 1 each 0   allopurinol  (ZYLOPRIM ) 300 MG tablet Take 1 tablet (300 mg total) by mouth daily. 90 tablet 3   azelastine  (ASTELIN ) 0.1 % nasal spray Place 2 sprays into both nostrils 2 (two) times daily. Use in each nostril as directed 30 mL 1   clindamycin  (CLINDAGEL ) 1 % gel Apply 1 application topically daily.     dextromethorphan -guaiFENesin  (MUCINEX  DM) 30-600 MG 12hr tablet Take 1 tablet by mouth 2 (two) times daily. 60 tablet 6   gabapentin  (NEURONTIN ) 300 MG capsule Take 3 capsules (900 mg total) by mouth 3 (three) times daily. 270 capsule 5   levocetirizine (XYZAL ) 5 MG tablet Take 1 tablet (5 mg total) by mouth every evening. 90 tablet 3   metoprolol  tartrate (LOPRESSOR ) 50 MG tablet Take 1 tablet (50 mg total) by mouth 2 (two) times daily. 180 tablet 3   montelukast  (SINGULAIR ) 10 MG tablet Take 1 tablet (10 mg total) by mouth at bedtime. 90 tablet  3   potassium chloride  (KLOR-CON ) 20 MEQ packet Take 20 mEq by mouth daily as needed (take with lasix  only). 30 packet 2   sildenafil  (VIAGRA ) 100 MG tablet Take 1 tablet (100 mg total) by mouth as needed for erectile dysfunction. 10 tablet 3   Spacer/Aero-Holding Chambers (PRO COMFORT SPACER ADULT) MISC UAD with inhaler 1 each 0   bumetanide  (BUMEX ) 0.5 MG tablet Take 1 tablet (0.5 mg total) by mouth daily. (Patient not taking: Reported on 08/07/2023) 30 tablet 3   cyanocobalamin  (VITAMIN B12) 1000 MCG tablet Take 1 tablet (1,000 mcg total) by mouth daily. (Patient not taking:  Reported on 08/07/2023) 90 tablet 3   fluticasone  (FLONASE ) 50 MCG/ACT nasal spray Place 1 spray into both nostrils daily. (Patient not taking: Reported on 08/07/2023) 16 g 11   metoCLOPramide (REGLAN) 5 MG/5ML solution Take 10 mg by mouth 4 (four) times daily. (Patient not taking: Reported on 08/07/2023)     nystatin  (MYCOSTATIN /NYSTOP ) powder Apply 1 Application topically 3 (three) times daily. (Patient not taking: Reported on 08/07/2023) 120 g 11   omeprazole (PRILOSEC) 40 MG capsule Take 40 mg by mouth daily. (Patient not taking: Reported on 08/07/2023)     ondansetron  (ZOFRAN ) 4 MG tablet Take 1 tablet (4 mg total) by mouth every 8 (eight) hours as needed for nausea or vomiting. (Patient not taking: Reported on 08/07/2023) 20 tablet 0   ONETOUCH DELICA LANCETS 33G MISC Use as needed daily (Patient not taking: Reported on 08/07/2023) 100 each 11   Semaglutide -Weight Management 1 MG/0.5ML SOAJ Inject 1 mg into the skin once a week for 28 days. (Patient not taking: Reported on 08/07/2023) 2 mL 0   [START ON 08/26/2023] Semaglutide -Weight Management 1.7 MG/0.75ML SOAJ Inject 1.7 mg into the skin once a week for 28 days. (Patient not taking: Reported on 08/07/2023) 3 mL 0   [START ON 09/24/2023] Semaglutide -Weight Management 2.4 MG/0.75ML SOAJ Inject 2.4 mg into the skin once a week for 28 days. (Patient not taking: Reported on 08/07/2023) 3 mL 0   ursodiol (ACTIGALL) 300 MG capsule Take 300 mg by mouth 2 (two) times daily. (Patient not taking: Reported on 08/07/2023)     Naltrexone-buPROPion HCl ER (CONTRAVE ) 8-90 MG TB12 Start 1 tablet every morning for 7 days, then 1 tablet twice daily for 7 days, then 2 tablets every morning and one every evening 120 tablet 0   Vitamin D , Ergocalciferol , (DRISDOL ) 1.25 MG (50000 UNIT) CAPS capsule Take 1 capsule (50,000 Units total) by mouth every 7 (seven) days. 12 capsule 3   No facility-administered medications prior to visit.   Allergies  Allergen Reactions   Vancomycin Other (See  Comments)    Affected kidneys    Shrimp [Shellfish Allergy]    Zosyn [Piperacillin Sod-Tazobactam So] Other (See Comments)     Objective:   Today's Vitals   08/07/23 1350  BP: (!) 164/78  Pulse: (!) 102  Temp: 97.6 F (36.4 C)  TempSrc: Temporal  SpO2: 94%  Weight: (!) 353 lb 12.8 oz (160.5 kg)  Height: 5' 9 (1.753 m)   Body mass index is 52.25 kg/m.   General: Well developed, well nourished. No acute distress. HEENT: Normocephalic, non-traumatic. PERRL, EOMI. Conjunctiva clear. External ears normal. EAC and TMs   normal bilaterally. Nose with severe congestion and purulent rhinorrhea on the right. Mild tenderness on percussion   over the maxillary sinuses. Mucous membranes moist. Oropharynx clear. Good dentition. Neck: Supple. No lymphadenopathy. No thyromegaly. Lungs: Clear to auscultation bilaterally.  No wheezing, rales or rhonchi. Psych: Alert and oriented. Normal mood and affect.  Health Maintenance Due  Topic Date Due   Hepatitis C Screening  Never done   Diabetic kidney evaluation - eGFR measurement  04/13/2022   HEMOGLOBIN A1C  05/17/2022   OPHTHALMOLOGY EXAM  03/01/2023     Assessment & Plan:   Problem List Items Addressed This Visit       Cardiovascular and Mediastinum   Essential hypertension   Blood pressure remains quite high. I recommend he return to see Dr. Rollene soon to discuss his hypertension management.      Other Visit Diagnoses       Acute non-recurrent maxillary sinusitis    -  Primary   I will prescribe a 10-day course of Augmentin .   Relevant Medications   amoxicillin -clavulanate (AUGMENTIN ) 875-125 MG tablet   fluconazole  (DIFLUCAN ) 150 MG tablet       Return for Reassessment with PCP.   Garnette CHRISTELLA Simpler, MD

## 2023-08-07 NOTE — Assessment & Plan Note (Signed)
 Blood pressure remains quite high. I recommend he return to see Dr. Okey Dupre soon to discuss his hypertension management.

## 2023-08-24 ENCOUNTER — Ambulatory Visit: Payer: Self-pay | Admitting: Internal Medicine

## 2023-08-24 ENCOUNTER — Ambulatory Visit (INDEPENDENT_AMBULATORY_CARE_PROVIDER_SITE_OTHER): Payer: Commercial Managed Care - HMO | Admitting: Family Medicine

## 2023-08-24 VITALS — BP 132/84 | HR 70 | Temp 97.6°F | Ht 69.0 in | Wt 359.4 lb

## 2023-08-24 DIAGNOSIS — J3089 Other allergic rhinitis: Secondary | ICD-10-CM | POA: Diagnosis not present

## 2023-08-24 DIAGNOSIS — J029 Acute pharyngitis, unspecified: Secondary | ICD-10-CM

## 2023-08-24 MED ORDER — AZELASTINE HCL 0.1 % NA SOLN: 2.0000 | Freq: Two times a day (BID) | NASAL | 0 refills | Status: DC

## 2023-08-24 NOTE — Assessment & Plan Note (Signed)
Discussed home care for viral illness, including rest, pushing fluids, and OTC medications as needed for symptom relief. Recommend hot tea with honey for sore throat symptoms. Follow-up if needed for worsening or persistent symptoms.

## 2023-08-24 NOTE — Telephone Encounter (Signed)
Copied from CRM [id number]. Topic: Clinical - Pink Word Triage >> Aug 24, 2023  8:24 AM Samuel Jester B wrote: Reason for Triage: Pt stated that he came in a few weeks ago, and if his symtpoms got worst to callback. He stated that he now has a sore throat and drainage more so a cough. I informed pt their is no availably until Monday Feb 3rd. He asked if his PCP could provide him some medication.   Chief Complaint: Sore throat and productive cough Symptoms: Sore throat Frequency: x 3 days Pertinent Negatives: Patient denies fever, nausea, vomiting, diarrhea, chest pain,  Disposition: [] ED /[] Urgent Care (no appt availability in office) / [x] Appointment(In office/virtual)/ []  [facility]/ [] Home Care/ [] Refused Recommended Disposition /[] [facility]/ []  Follow-up with PCP Additional Notes: Patient advised that he was seen here recently (08/07/2023) and he was told that if his symptoms got worse to call back.  He said he felt a little better for a week but a sore throat started 3 days ago. Patient also states that he is having a productive cough with brownish color.  Patient had tried gargling warm salt water and taken Sudafed.  Patient denies any fever, nausea, vomiting, diarrhea, chest pain at this time.  Patient was hoping that he could just get some antibiotics but he was advised that with all the symptoms going on for a few weeks, a new onset of a sore throat and then starting to cough up brown phlegm--it is recommended that he see a provider to have his throat assessed and a provider can assess his lung sounds. Appointment is made for today 08/24/2023 at 3:20pm with Dr Herbie Drape at the [place] location.  Patient is also advised that if anything gets worse to go to the emergency room.  He verbalized understanding.  Reason for Disposition  [1] Sore throat is the only symptom AND [2] present > 48 hours  Answer Assessment - Initial Assessment Questions 1. ONSET: "When did the throat  start hurting?" (Hours or days ago)      3 days ago 2. SEVERITY: "How bad is the sore throat?" (Scale 1-10; mild, moderate or severe)   - MILD (1-3):  Doesn't interfere with eating or normal activities.   - MODERATE (4-7): Interferes with eating some solids and normal activities.   - SEVERE (8-10):  Excruciating pain, interferes with most normal activities.   - SEVERE WITH DYSPHAGIA (10): Can't swallow liquids, drooling.     7 3. STREP EXPOSURE: "Has there been any exposure to strep within the past week?" If Yes, ask: "What type of contact occurred?"      No 4.  VIRAL SYMPTOMS: "Are there any symptoms of a cold, such as a runny nose, cough, hoarse voice or red eyes?"      Slight productive cough 5. FEVER: "Do you have a fever?" If Yes, ask: "What is your temperature, how was it measured, and when did it start?"     No 6. PUS ON THE TONSILS: "Is there pus on the tonsils in the back of your throat?"     No 7. OTHER SYMPTOMS: "Do you have any other symptoms?" (e.g., difficulty breathing, headache, rash)     Slight cough  Protocols used: Sore Throat-A-AH

## 2023-08-24 NOTE — Progress Notes (Signed)
[facility] [address] [place] [address] Dept: [phone] Dept Fax: [phone]  Office Visit  Subjective:    Patient ID: Derrick [name], male    DOB: [birth year], 43 y.o..   MRN: [id number]  Chief Complaint  Patient presents with   Sore Throat    C/o having ST x 3 days.  Has been doing salt water rinses and hot tea with honey.     History of Present Illness:  Patient is in today complaining of a 2-day history of sore throat. I had seen Mr. Riddle earlier this month with signs of a sinus infection. He took a course of antibiotics and did feel better. However, on Wed. he noted sore throat developing. He related this in part to singing he does on the weekends/Sundays. He has not had significant cough. He has been drinking hot water with honey in it and doing salt water rinses.  Mr. Anguiano has a history of allergic rhinitis. He notes he is out of some of his meds for this.  Past Medical History: Patient Active Problem List   Diagnosis Date Noted   Urinary frequency 06/01/2023   B12 deficiency 11/15/2021   Rash 04/13/2021   Left leg swelling 11/24/2019   Chronic respiratory failure with hypoxia (HCC) 06/17/2019   Chronic pain syndrome 06/17/2019   Vitamin D deficiency 11/12/2017   Tinnitus 09/27/2017   Numbness and tingling of hand 11/30/2016   Tracheostomy dependent [facility])    Routine general medical examination at a health care facility 09/21/2016   Acne 09/21/2016   Viral URI 09/03/2016   ED (erectile dysfunction) 06/02/2016   Acute cough    Other allergic rhinitis    History of diabetes mellitus 08/11/2015   Dysphagia    Pulmonary hypertension (HCC)    Morbid obesity (HCC) 06/13/2015   Essential hypertension 06/13/2015   OSA (obstructive sleep apnea) 06/13/2015   Obesity hypoventilation syndrome (HCC) 06/13/2015   Chronic diastolic heart failure (HCC) 06/13/2015   Past Surgical History:   Procedure Laterality Date   TRACHEOSTOMY TUBE PLACEMENT N/A 06/21/2015   Procedure: TRACHEOSTOMY;  Surgeon: Newman Pies, MD;  Location: [facility] OR;  Service: ENT;  Laterality: N/A;   Family History  Problem Relation Age of Onset   Diabetes Maternal Grandmother    Hypertension Maternal Grandmother    Asthma Other    Cancer Other    Stroke Other    Heart disease Other    Outpatient Medications Prior to Visit  Medication Sig Dispense Refill   acetaminophen (TYLENOL) 325 MG tablet Take 2 tablets (650 mg total) by mouth every 6 (six) hours as needed for mild pain or headache (fever >/= 101).     albuterol (VENTOLIN HFA) 108 (90 Base) MCG/ACT inhaler Inhale 2 puffs into the lungs every 6 (six) hours as needed for wheezing or shortness of breath. 1 each 0   allopurinol (ZYLOPRIM) 300 MG tablet Take 1 tablet (300 mg total) by mouth daily. 90 tablet 3   azelastine (ASTELIN) 0.1 % nasal spray Place 2 sprays into both nostrils 2 (two) times daily. Use in each nostril as directed 30 mL 1   bumetanide (BUMEX) 0.5 MG tablet Take 1 tablet (0.5 mg total) by mouth daily. 30 tablet 3   clindamycin (CLINDAGEL) 1 % gel Apply 1 application topically daily.     cyanocobalamin (VITAMIN B12) 1000 MCG tablet Take 1 tablet (1,000 mcg total) by mouth daily. 90 tablet 3   dextromethorphan-guaiFENesin (MUCINEX DM)  30-600 MG 12hr tablet Take 1 tablet by mouth 2 (two) times daily. 60 tablet 6   fluticasone (FLONASE) 50 MCG/ACT nasal spray Place 1 spray into both nostrils daily. 16 g 11   gabapentin (NEURONTIN) 300 MG capsule Take 3 capsules (900 mg total) by mouth 3 (three) times daily. 270 capsule 5   levocetirizine (XYZAL) 5 MG tablet Take 1 tablet (5 mg total) by mouth every evening. 90 tablet 3   metoCLOPramide (REGLAN) 5 MG/5ML solution Take 10 mg by mouth 4 (four) times daily.     metoprolol tartrate (LOPRESSOR) 50 MG tablet Take 1 tablet (50 mg total) by mouth 2 (two) times daily. 180 tablet 3   montelukast (SINGULAIR)  10 MG tablet Take 1 tablet (10 mg total) by mouth at bedtime. 90 tablet 3   nystatin (MYCOSTATIN/NYSTOP) powder Apply 1 Application topically 3 (three) times daily. 120 g 11   omeprazole (PRILOSEC) 40 MG capsule Take 40 mg by mouth daily.     ondansetron (ZOFRAN) 4 MG tablet Take 1 tablet (4 mg total) by mouth every 8 (eight) hours as needed for nausea or vomiting. 20 tablet 0   ONETOUCH DELICA LANCETS 33G MISC Use as needed daily 100 each 11   potassium chloride (KLOR-CON) 20 MEQ packet Take 20 mEq by mouth daily as needed (take with lasix only). 30 packet 2   Semaglutide-Weight Management 1 MG/0.5ML SOAJ Inject 1 mg into the skin once a week for 28 days. 2 mL 0   [START ON 08/26/2023] Semaglutide-Weight Management 1.7 MG/0.75ML SOAJ Inject 1.7 mg into the skin once a week for 28 days. 3 mL 0   [START ON 09/24/2023] Semaglutide-Weight Management 2.4 MG/0.75ML SOAJ Inject 2.4 mg into the skin once a week for 28 days. 3 mL 0   sildenafil (VIAGRA) 100 MG tablet Take 1 tablet (100 mg total) by mouth as needed for erectile dysfunction. 10 tablet 3   Spacer/Aero-Holding Chambers (PRO COMFORT SPACER ADULT) MISC UAD with inhaler 1 each 0   ursodiol (ACTIGALL) 300 MG capsule Take 300 mg by mouth 2 (two) times daily.     amoxicillin-clavulanate (AUGMENTIN) 875-125 MG tablet Take 1 tablet by mouth 2 (two) times daily. 20 tablet 0   No facility-administered medications prior to visit.   Allergies  Allergen Reactions   Vancomycin Other (See Comments)    Affected kidneys    Shrimp [Shellfish Allergy]    Zosyn [Piperacillin Sod-Tazobactam So] Other (See Comments)     Objective:   Today's Vitals   08/24/23 1539  BP: 132/84  Pulse: 70  Temp: 97.6 F (36.4 C)  TempSrc: Temporal  SpO2: 98%  Weight: (!) 359 lb 6.4 oz (163 kg)  Height: 5\' 9"  (1.753 m)   Body mass index is 53.07 kg/m.   General: Well developed, well nourished. No acute distress. HEENT: Normocephalic, non-traumatic. Conjunctiva  clear. External ears normal. EAC and TMs normal   bilaterally. Nose with moderate congestion, but scant rhinorrhea. Mucous membranes moist.   Mild redness of the posterior oropharynx. No tonsillar exudates. Good dentition. Psych: Alert and oriented. Normal mood and affect.  Health Maintenance Due  Topic Date Due   Hepatitis C Screening  Never done   Pneumococcal Vaccine 89-44 Years old (2 of 2 - PCV) 11/23/2016   Diabetic kidney evaluation - eGFR measurement  04/13/2022   HEMOGLOBIN A1C  05/17/2022   OPHTHALMOLOGY EXAM  03/01/2023     Assessment & Plan:   Problem List Items Addressed This Visit  Respiratory   Other allergic rhinitis   Nasal exam shows findings consistent with the patient's allergy history. I see Dr. Okey Dupre had renewed his fluticasone nasal spray earlier today. I will renew his azelastine spray, as well.      Relevant Medications   azelastine (ASTELIN) 0.1 % nasal spray   Viral pharyngitis - Primary   Discussed home care for viral illness, including rest, pushing fluids, and OTC medications as needed for symptom relief. Recommend hot tea with honey for sore throat symptoms. Follow-up if needed for worsening or persistent symptoms.        Return if symptoms worsen or fail to improve.   Loyola Mast, MD

## 2023-08-24 NOTE — Assessment & Plan Note (Signed)
Nasal exam shows findings consistent with the patient's allergy history. I see Dr. Okey Dupre had renewed his fluticasone nasal spray earlier today. I will renew his azelastine spray, as well.

## 2023-09-01 ENCOUNTER — Emergency Department (HOSPITAL_BASED_OUTPATIENT_CLINIC_OR_DEPARTMENT_OTHER)
Admission: EM | Admit: 2023-09-01 | Discharge: 2023-09-01 | Disposition: A | Discharge status: Home / Self Care | Payer: Commercial Managed Care - HMO | Attending: Emergency Medicine | Admitting: Emergency Medicine

## 2023-09-01 ENCOUNTER — Encounter (HOSPITAL_BASED_OUTPATIENT_CLINIC_OR_DEPARTMENT_OTHER): Payer: Self-pay | Admitting: Emergency Medicine

## 2023-09-01 DIAGNOSIS — I83899 Varicose veins of unspecified lower extremities with other complications: Secondary | ICD-10-CM

## 2023-09-01 DIAGNOSIS — I83892 Varicose veins of left lower extremities with other complications: Secondary | ICD-10-CM | POA: Diagnosis present

## 2023-09-01 DIAGNOSIS — I110 Hypertensive heart disease with heart failure: Secondary | ICD-10-CM | POA: Insufficient documentation

## 2023-09-01 DIAGNOSIS — Z79899 Other long term (current) drug therapy: Secondary | ICD-10-CM | POA: Insufficient documentation

## 2023-09-01 DIAGNOSIS — I509 Heart failure, unspecified: Secondary | ICD-10-CM | POA: Insufficient documentation

## 2023-09-01 MED ORDER — GELATIN ABSORBABLE 12-7 MM EX MISC
1.0000 | Freq: Once | CUTANEOUS | Status: DC
  Filled 2023-09-01: qty 1

## 2023-09-01 NOTE — Discharge Instructions (Signed)
Leave dressing in place for the next 24 hours, then removed.  Return to the emergency department for recurrent bleeding that you are unable to control, or for other new and concerning symptoms.

## 2023-09-01 NOTE — ED Provider Notes (Signed)
[place] [facility] Provider Note   CSN: [id number] Arrival date & time: 09/01/23  0424     History  Chief Complaint  Patient presents with   Wound Check    Derrick [name] is a 43 y.o. male.  Patient is a 43 year old male with past medical history of hypertension, obesity, obstructive sleep apnea, CHF, pulmonary hypertension.  Patient presenting today with complaints of bleeding from his left ankle.  He was using a scraper tool to remove dead skin from the inside of his ankle when he began bleeding from what appears to be a varicose vein.  He was having difficulty stopping the bleeding, then wrapped a towel around his leg and came here.  Bleeding mostly stopped by the time of arrival.  The history is provided by the patient.       Home Medications Prior to Admission medications   Medication Sig Start Date End Date Taking? Authorizing Provider  acetaminophen (TYLENOL) 325 MG tablet Take 2 tablets (650 mg total) by mouth every 6 (six) hours as needed for mild pain or headache (fever >/= 101). 06/21/19   Lonia Blood, MD  albuterol (VENTOLIN HFA) 108 (90 Base) MCG/ACT inhaler Inhale 2 puffs into the lungs every 6 (six) hours as needed for wheezing or shortness of breath. 08/23/22   Myrlene Broker, MD  allopurinol (ZYLOPRIM) 300 MG tablet Take 1 tablet (300 mg total) by mouth daily. 08/23/22   Myrlene Broker, MD  azelastine (ASTELIN) 0.1 % nasal spray Place 2 sprays into both nostrils 2 (two) times daily. Use in each nostril as directed 08/24/23   Loyola Mast, MD  bumetanide (BUMEX) 0.5 MG tablet Take 1 tablet (0.5 mg total) by mouth daily. 05/31/23   Myrlene Broker, MD  clindamycin (CLINDAGEL) 1 % gel Apply 1 application topically daily. 06/10/19   [provider]  cyanocobalamin (VITAMIN B12) 1000 MCG tablet Take 1 tablet (1,000 mcg total) by mouth daily. 06/13/22   Myrlene Broker, MD   dextromethorphan-guaiFENesin [facility] DM) 30-600 MG 12hr tablet Take 1 tablet by mouth 2 (two) times daily. 11/15/21   Myrlene Broker, MD  fluticasone (FLONASE) 50 MCG/ACT nasal spray Place 1 spray into both nostrils daily. 08/23/22   Myrlene Broker, MD  gabapentin (NEURONTIN) 300 MG capsule Take 3 capsules (900 mg total) by mouth 3 (three) times daily. 08/24/22   Myrlene Broker, MD  levocetirizine (XYZAL) 5 MG tablet Take 1 tablet (5 mg total) by mouth every evening. 07/19/23   Myrlene Broker, MD  metoCLOPramide (REGLAN) 5 MG/5ML solution Take 10 mg by mouth 4 (four) times daily. 03/10/20   [provider]  metoprolol tartrate (LOPRESSOR) 50 MG tablet Take 1 tablet (50 mg total) by mouth 2 (two) times daily. 08/23/22   Myrlene Broker, MD  montelukast (SINGULAIR) 10 MG tablet Take 1 tablet (10 mg total) by mouth at bedtime. 08/23/22   Myrlene Broker, MD  nystatin (MYCOSTATIN/NYSTOP) powder Apply 1 Application topically 3 (three) times daily. 08/23/22   Myrlene Broker, MD  omeprazole (PRILOSEC) 40 MG capsule Take 40 mg by mouth daily. 03/08/20   [provider]  ondansetron (ZOFRAN) 4 MG tablet Take 1 tablet (4 mg total) by mouth every 8 (eight) hours as needed for nausea or vomiting. 06/24/19   Myrlene Broker, MD  [facility] DELICA LANCETS 33G MISC Use as needed daily 11/30/16   Myrlene Broker, MD  potassium  chloride (KLOR-CON) 20 MEQ packet Take 20 mEq by mouth daily as needed (take with lasix only). 06/13/22   Myrlene Broker, MD  Semaglutide-Weight Management 1.7 MG/0.75ML SOAJ Inject 1.7 mg into the skin once a week for 28 days. 08/26/23 09/23/23  Myrlene Broker, MD  Semaglutide-Weight Management 2.4 MG/0.75ML SOAJ Inject 2.4 mg into the skin once a week for 28 days. 09/24/23 10/22/23  Myrlene Broker, MD  sildenafil (VIAGRA) 100 MG tablet Take 1 tablet (100 mg total) by mouth as needed for erectile dysfunction.  11/15/21   Myrlene Broker, MD  Spacer/Aero-Holding Chambers (PRO COMFORT SPACER ADULT) MISC UAD with inhaler 09/23/21   Pincus Sanes, MD  ursodiol (ACTIGALL) 300 MG capsule Take 300 mg by mouth 2 (two) times daily. 03/19/20   [provider]      Allergies    Vancomycin, Shrimp [shellfish allergy], and Zosyn [piperacillin sod-tazobactam so]    Review of Systems   Review of Systems  All other systems reviewed and are negative.   Physical Exam Updated Vital Signs BP (!) 161/93 (BP Location: Right Arm)   Pulse 74   Temp 97.7 F (36.5 C) (Oral)   Resp 20   Ht 5\' 9"  (1.753 m)   Wt (!) 163 kg   SpO2 99%   BMI 53.07 kg/m  Physical Exam Vitals and nursing note reviewed.  Constitutional:      Appearance: Normal appearance.  Pulmonary:     Effort: Pulmonary effort is normal.  Musculoskeletal:     Comments: To the medial aspect of the left ankle, there is a collection of varicose veins.  There is 1 small puncture overlying the area, but no active bleeding.  Skin:    General: Skin is warm and dry.  Neurological:     Mental Status: He is alert and oriented to person, place, and time.     ED Results / Procedures / Treatments   Labs (all labs ordered are listed, but only abnormal results are displayed) Labs Reviewed - No data to display  EKG None  Radiology No results found.  Procedures Procedures    Medications Ordered in ED Medications  gelatin adsorbable (GELFOAM/SURGIFOAM) sponge 12-7 mm 1 each (has no administration in time range)    ED Course/ Medical Decision Making/ A&P  Patient presenting with bleeding from a varicose vein.  A Surgifoam dressing was applied and bleeding controlled.  Patient to be discharged with as needed return.  Final Clinical Impression(s) / ED Diagnoses Final diagnoses:  None    Rx / DC Orders ED Discharge Orders     None         Geoffery Lyons, MD 09/01/23 [phone]

## 2023-09-01 NOTE — ED Triage Notes (Signed)
Pt was using tool to scrap off dead skin and hit something on Left ankle, was unable to get bleeding to stop. Has pressure bandage on in triage.

## 2023-09-11 ENCOUNTER — Other Ambulatory Visit: Payer: Self-pay | Admitting: Internal Medicine

## 2023-09-11 DIAGNOSIS — E118 Type 2 diabetes mellitus with unspecified complications: Secondary | ICD-10-CM

## 2023-11-06 ENCOUNTER — Other Ambulatory Visit: Payer: Self-pay | Admitting: Internal Medicine

## 2023-11-06 MED ORDER — POTASSIUM CHLORIDE 20 MEQ PO PACK: 20.0000 meq | PACK | Freq: Every day | ORAL | 2 refills | Status: AC | PRN

## 2023-11-06 NOTE — Telephone Encounter (Signed)
 Copied from CRM [id number]. Topic: Clinical - Medication Refill >> Nov 06, 2023 10:42 AM Myrtice Lauth wrote: Most Recent Primary Care Visit:  Provider: Loyola Mast  Department: [facility]  Visit Type: ACUTE  Date: 08/24/2023  Medication: potassium chloride (KLOR-CON) 20 MEQ packet  Has the patient contacted their pharmacy? Yes (Agent: If no, request that the patient contact the pharmacy for the refill. If patient does not wish to contact the pharmacy document the reason why and proceed with request.) (Agent: If yes, when and what did the pharmacy advise?)  Is this the correct pharmacy for this prescription? Yes If no, delete pharmacy and type the correct one.  This is the patient's preferred pharmacy:  [facility] [address] [place], [place] - [address] [place] [address] Phone: [phone] Fax: [phone]     Has the prescription been filled recently? Yes  Is the patient out of the medication? Yes  Has the patient been seen for an appointment in the last year OR does the patient have an upcoming appointment? Yes  Can we respond through MyChart? Yes  Agent: Please be advised that Rx refills may take up to 3 business days. We ask that you follow-up with your pharmacy.

## 2023-11-06 NOTE — Telephone Encounter (Signed)
 Last Fill: 06/13/22  Last OV: 08/24/23 ACUTE Next OV: None Scheduled  Routing to provider for review/authorization.

## 2023-11-15 ENCOUNTER — Ambulatory Visit (INDEPENDENT_AMBULATORY_CARE_PROVIDER_SITE_OTHER): Admitting: Nurse Practitioner

## 2023-11-15 VITALS — BP 124/88 | HR 76 | Temp 99.5°F | Ht 69.0 in | Wt 323.2 lb

## 2023-11-15 DIAGNOSIS — J019 Acute sinusitis, unspecified: Secondary | ICD-10-CM | POA: Insufficient documentation

## 2023-11-15 DIAGNOSIS — N528 Other male erectile dysfunction: Secondary | ICD-10-CM

## 2023-11-15 LAB — POCT INFLUENZA A/B
Influenza A, POC: NEGATIVE
Influenza B, POC: NEGATIVE

## 2023-11-15 LAB — POC COVID19 BINAXNOW: SARS Coronavirus 2 Ag: NEGATIVE

## 2023-11-15 LAB — POCT RESPIRATORY SYNCYTIAL VIRUS: RSV Rapid Ag: NEGATIVE

## 2023-11-15 MED ORDER — LEVOCETIRIZINE DIHYDROCHLORIDE 5 MG PO TABS
5.0000 mg | ORAL_TABLET | Freq: Every evening | ORAL | 1 refills | Status: AC
Start: 2023-11-15 — End: ?

## 2023-11-15 MED ORDER — ALBUTEROL SULFATE HFA 108 (90 BASE) MCG/ACT IN AERS: 2.0000 | INHALATION_SPRAY | Freq: Four times a day (QID) | RESPIRATORY_TRACT | 0 refills | Status: AC | PRN

## 2023-11-15 MED ORDER — AZITHROMYCIN 250 MG PO TABS: ORAL_TABLET | ORAL | 0 refills | Status: AC

## 2023-11-15 MED ORDER — TADALAFIL 10 MG PO TABS: 10.0000 mg | ORAL_TABLET | ORAL | 0 refills | Status: DC | PRN

## 2023-11-15 NOTE — Assessment & Plan Note (Signed)
 Acute We discussed that the majority of the time this is viral in origin.  Recommend treating symptoms with his Astelin nasal spray, as needed albuterol inhaler, and Xyzal at bedtime.  He is warned of possible sedation related Xyzal. We discussed that if symptoms persist past an additional 5 days or if symptoms get worse over the weekend he could start antibiotic.  Will send in azithromycin so he has this on hand if needed. Last EKG completed in care everywhere last month did not show QT interval prolongation.

## 2023-11-15 NOTE — Progress Notes (Signed)
 Established Patient Office Visit  Subjective   Patient ID: Derrick [name], male    DOB: August 05, 1980  Age: 43 y.o. MRN: [id number]  Chief Complaint  Patient presents with   Nasal Congestion    Nasal and chest congestion, been going on since Saturday. No headache or body aches     Patient has today for acute visit for the above.  He is a 43 year old male with past medical history significant for CHF, diabetes, GERD, hypertension, morbid obesity, obesity hypoventilation syndrome, obstructive sleep apnea, GERD, pulmonary hypertension.  He arrives today with 5-day history of nasal congestion and productive cough.  Denies any wheezing or shortness of breath.  No fever or chills.  Was exposed to sick contact (son).  Has been using his azelastine nasal spray with some improvement in symptoms.  Reports he is not currently taking his Singulair, Mucinex, albuterol inhaler, or Xyzal.  Over the last 2 days has been feeling slightly better.  He also reports erectile dysfunction and would like to discuss treatment options.  Per chart review he has tried sildenafil and tadalafil.  Historically he has had some benefit from tadalafil, has previously been recommended to follow-up with urology with PCP.  It is not clear if he did follow-up with them.  Not currently on nitrate.  PSA from approximately 6 months ago was 0.63.    Review of Systems  Constitutional:  Negative for chills and fever.  HENT:  Positive for congestion.   Eyes:  Negative for blurred vision.  Respiratory:  Positive for cough. Negative for shortness of breath and wheezing.   Cardiovascular:  Negative for chest pain.  Neurological:  Negative for headaches.      Objective:     BP 124/88   Pulse 76   Temp 99.5 F (37.5 C) (Temporal)   Ht 5\' 9"  (1.753 m)   Wt (!) 323 lb 4 oz (146.6 kg)   SpO2 97%   BMI 47.74 kg/m  BP Readings from Last 3 Encounters:  11/15/23 124/88  09/01/23 133/82  08/24/23 132/84   Wt Readings  from Last 3 Encounters:  11/15/23 (!) 323 lb 4 oz (146.6 kg)  09/01/23 (!) 359 lb 5.6 oz (163 kg)  08/24/23 (!) 359 lb 6.4 oz (163 kg)      Physical Exam Vitals reviewed.  Constitutional:      Appearance: Normal appearance.  HENT:     Head: Normocephalic and atraumatic.     Comments: Bilateral frontal sinus tenderness to palpation.     Right Ear: Hearing, tympanic membrane, ear canal and external ear normal.     Left Ear: Hearing, tympanic membrane, ear canal and external ear normal.  Cardiovascular:     Rate and Rhythm: Normal rate and regular rhythm.  Pulmonary:     Effort: Pulmonary effort is normal.     Breath sounds: Normal breath sounds.  Musculoskeletal:     Cervical back: Neck supple.  Lymphadenopathy:     Cervical: No cervical adenopathy.  Skin:    General: Skin is warm and dry.  Neurological:     Mental Status: He is alert and oriented to person, place, and time.  Psychiatric:        Mood and Affect: Mood normal.        Behavior: Behavior normal.        Thought Content: Thought content normal.        Judgment: Judgment normal.      Results for orders placed or performed in  visit on 11/15/23  POCT Influenza A/B  Result Value Ref Range   Influenza A, POC Negative Negative   Influenza B, POC Negative Negative  POC COVID-19  Result Value Ref Range   SARS Coronavirus 2 Ag Negative Negative  POCT respiratory syncytial virus  Result Value Ref Range   RSV Rapid Ag negative       The 10-year ASCVD risk score (Arnett DK, et al., 2019) is: 8.6%* (Cholesterol units were assumed)    Assessment & Plan:   Problem List Items Addressed This Visit       Respiratory   Acute sinusitis - Primary   Acute We discussed that the majority of the time this is viral in origin.  Recommend treating symptoms with his Astelin nasal spray, as needed albuterol inhaler, and Xyzal at bedtime.  He is warned of possible sedation related Xyzal. We discussed that if symptoms  persist past an additional 5 days or if symptoms get worse over the weekend he could start antibiotic.  Will send in azithromycin so he has this on hand if needed. Last EKG completed in care everywhere last month did not show QT interval prolongation.      Relevant Medications   albuterol (VENTOLIN HFA) 108 (90 Base) MCG/ACT inhaler   levocetirizine (XYZAL) 5 MG tablet   azithromycin (ZITHROMAX) 250 MG tablet   Other Relevant Orders   POCT Influenza A/B (Completed)   POC COVID-19 (Completed)   POCT respiratory syncytial virus (Completed)     Other   ED (erectile dysfunction)   Chronic Will send in prescription for Cialis.  Patient told to take 1 tablet every 48 hours as needed for erectile dysfunction.  He was warned to discontinue if he experiences lightheadedness or dizziness and reach out to his PCP if this were to occur as well.  He was also warned to not take Cialis if he needs to use nitroglycerin, I do not see where this is prescribed to him but he reports understanding.      Relevant Medications   tadalafil (CIALIS) 10 MG tablet   Other Relevant Orders   POCT Influenza A/B (Completed)   POC COVID-19 (Completed)   POCT respiratory syncytial virus (Completed)    Return if symptoms worsen or fail to improve.    Zorita Hiss, NP

## 2023-11-15 NOTE — Assessment & Plan Note (Signed)
 Chronic Will send in prescription for Cialis.  Patient told to take 1 tablet every 48 hours as needed for erectile dysfunction.  He was warned to discontinue if he experiences lightheadedness or dizziness and reach out to his PCP if this were to occur as well.  He was also warned to not take Cialis if he needs to use nitroglycerin, I do not see where this is prescribed to him but he reports understanding.

## 2023-11-22 ENCOUNTER — Other Ambulatory Visit: Payer: Self-pay | Admitting: Internal Medicine

## 2023-11-22 DIAGNOSIS — E118 Type 2 diabetes mellitus with unspecified complications: Secondary | ICD-10-CM

## 2023-11-22 MED ORDER — GABAPENTIN 300 MG PO CAPS
900.0000 mg | ORAL_CAPSULE | Freq: Three times a day (TID) | ORAL | 0 refills | Status: AC
Start: 2023-11-22 — End: ?

## 2023-11-22 NOTE — Telephone Encounter (Signed)
 Copied from CRM [id number]. Topic: Clinical - Medication Refill >> Nov 22, 2023 10:44 AM Annelle Kiel wrote: Most Recent Primary Care Visit:  Provider: Zorita Hiss  Department: [facility]  Visit Type: ACUTE  Date: 11/15/2023  Medication: gabapentin  (NEURONTIN ) 300 MG capsule  Has the patient contacted their pharmacy? Yes (Agent: If no, request that the patient contact the pharmacy for the refill. If patient does not wish to contact the pharmacy document the reason why and proceed with request.) (Agent: If yes, when and what did the pharmacy advise?)  Is this the correct pharmacy for this prescription? Yes If no, delete pharmacy and type the correct one.  This is the patient's preferred pharmacy:  [facility] [address] [place], [place] - [address] [place] [address] Phone: [phone] Fax: [phone]    Has the prescription been filled recently? No  Is the patient out of the medication? Yes  Has the patient been seen for an appointment in the last year OR does the patient have an upcoming appointment? Yes  Can we respond through MyChart? Yes  Agent: Please be advised that Rx refills may take up to 3 business days. We ask that you follow-up with your pharmacy.

## 2023-12-03 ENCOUNTER — Other Ambulatory Visit (HOSPITAL_COMMUNITY): Payer: Self-pay

## 2023-12-13 ENCOUNTER — Other Ambulatory Visit: Payer: Self-pay | Admitting: Internal Medicine

## 2023-12-13 MED ORDER — METOPROLOL TARTRATE 50 MG PO TABS: 50.0000 mg | ORAL_TABLET | Freq: Two times a day (BID) | ORAL | 0 refills | Status: DC

## 2023-12-20 ENCOUNTER — Ambulatory Visit (HOSPITAL_COMMUNITY)
Admission: RE | Admit: 2023-12-20 | Discharge: 2023-12-20 | Disposition: A | Discharge status: Home / Self Care | Source: Ambulatory Visit | Attending: Acute Care | Admitting: Acute Care

## 2023-12-20 DIAGNOSIS — Z930 Tracheostomy status: Secondary | ICD-10-CM

## 2023-12-20 DIAGNOSIS — E662 Morbid (severe) obesity with alveolar hypoventilation: Secondary | ICD-10-CM | POA: Diagnosis present

## 2023-12-20 DIAGNOSIS — G4733 Obstructive sleep apnea (adult) (pediatric): Secondary | ICD-10-CM | POA: Diagnosis present

## 2023-12-20 DIAGNOSIS — I5032 Chronic diastolic (congestive) heart failure: Secondary | ICD-10-CM | POA: Diagnosis present

## 2023-12-20 NOTE — Progress Notes (Signed)
 Reason for Visit  Routine tracheostomy change and follow-up  HPI 43 year old male patient well-known to me.  Tracheostomy dependent for several years now secondary to OSA, OHS, and diastolic heart failure.  Over the last 2 years she has had remarkable improvement in overall general health, this is included 2 bariatric surgeries.  He has had significant weight loss, over the last year has lost over 100 pounds.  He has been tolerating capping trials for over a year now, and also had a sleep study for which CPAP was recommended with plan to hopefully decannulate.  Unfortunately CPAP tolerance has been poor.  Seems as though it was somewhat of a comfort issue and also required pressure to overcome his obstruction.  Of note last sleep study was about 100 pounds ago. Presents today for planned tracheostomy change is reporting some mild stoma site discomfort]  Review of Systems  Constitutional:  Negative for fever.  HENT:  Positive for sore throat. Negative for congestion and sinus pain.   Eyes: Negative.   Respiratory: Negative.    Cardiovascular: Negative.   Gastrointestinal: Negative.   Genitourinary: Negative.   Skin:        Mild stoma site discomfort   Exam  Sats mid 90s room air   General Pleasant 43 year old male patient ambulatory to tracheostomy clinic today he is in no acute distress HEENT normocephalic atraumatic no jugular venous distention appreciated tracheostomy stoma was unremarkable.  Phonation quality a little bit more raspy than baseline Pulmonary clear to auscultation Cardiac regular rate and rhythm Abdomen soft nontender Neuro intact   Impression/plan Active Problems:   Tracheostomy dependent (HCC)   Morbid obesity (HCC)   OSA (obstructive sleep apnea)   Obesity hypoventilation syndrome (HCC)   Chronic diastolic heart failure (HCC)  . Tracheostomy dependent [facility]) Assessment & Plan: Last tracheostomy change 12/20/2023 Current tracheostomy model: Size 6 cuffless  Bivona Portex  Impression Tracheostomy dependent secondary to OSA and OHS, he is already proven to meet criteria for decannulation however compliance with his CPAP has been his largest barrier.  I suspect this was at least to some degree due to the amount of positive pressure required to overcome his obstruction, and discomfort because of that.  He is now lost over 100 pounds since his sleep study so I suspect his pressure requirements will be much less, and I am hopeful that he would then tolerate CPAP easier. Plan Continue current routine tracheostomy care I have recommended that he follow-up with sleep medicine, he was followed by Dr. Matilde Son who is now at [facility], he is going to stay with him given how long they have worked together, however if [facility] is out of network he is asked that I assist with him finding a new sleep physician.  Ideally I think he needs another sleep study a new sleep/titration study with the hopes that his pressure requirements will be less and thus compliance improved Once he can tolerate CPAP with fairly regular compliance we can remove the tracheostomy    My time 23 min

## 2023-12-20 NOTE — Assessment & Plan Note (Signed)
 Last tracheostomy change 12/20/2023 Current tracheostomy model: Size 6 cuffless Bivona Portex  Impression Tracheostomy dependent secondary to OSA and OHS, he is already proven to meet criteria for decannulation however compliance with his CPAP has been his largest barrier.  I suspect this was at least to some degree due to the amount of positive pressure required to overcome his obstruction, and discomfort because of that.  He is now lost over 100 pounds since his sleep study so I suspect his pressure requirements will be much less, and I am hopeful that he would then tolerate CPAP easier. Plan Continue current routine tracheostomy care I have recommended that he follow-up with sleep medicine, he was followed by Dr. Matilde Son who is now at [facility], he is going to stay with him given how long they have worked together, however if [facility] is out of network he is asked that I assist with him finding a new sleep physician.  Ideally I think he needs another sleep study a new sleep/titration study with the hopes that his pressure requirements will be less and thus compliance improved Once he can tolerate CPAP with fairly regular compliance we can remove the tracheostomy

## 2023-12-20 NOTE — Progress Notes (Signed)
 Tracheostomy Procedure Note  Derrick [name] [id number] 11/25/1980  Pre Procedure Tracheostomy Information  Trach Brand: Portex Bivona Size: 6.0  60A160 Style: Uncuffed Secured by: Velcro   Procedure: Trach Change and Trach Cleaning    Post Procedure Tracheostomy Information  Trach Brand: Portex Bivona Size: 6.0 60A160 Style: Uncuffed Secured by: Velcro   Post Procedure Evaluation:  ETCO2 positive color change from yellow to purple : Yes.   Vital signs:VSS Patients current condition: stable Complications: No apparent complications Trach site exam: clean, dry Wound care done: 4 x 4 gauze drain Patient did tolerate procedure well.   Education: none  Prescription needs: none    Additional needs: New PMV given a this viisi

## 2024-03-14 ENCOUNTER — Ambulatory Visit: Payer: Self-pay

## 2024-03-14 NOTE — Telephone Encounter (Signed)
 FYI Only or Action Required?: FYI only for provider.  Patient was last seen in primary care on 11/15/2023 by Elnor Lauraine BRAVO, NP.  Called Nurse Triage reporting Leg Injury.  Symptoms began several weeks ago.  Interventions attempted: Nothing.  Symptoms are: stable.  Triage Disposition: See PCP When Office is Open (Within 3 Days)  Patient/caregiver understands and will follow disposition?: Yes    Copied from CRM #[id number]. Topic: Clinical - Red Word Triage >> Mar 14, 2024  2:19 PM Rosina BIRCH wrote: Reason for RMF:Ozqu leg above his calf on the front of the leg he hit it with the car door and it bled. Patient stated it made his leg sore and the patient has had issues with cellulitis. Patient stated it is difficult to stand and the patient stated it has been over three weeks and it is not healing. Patient stated fluid will leak out of it Reason for Disposition  [1] Last tetanus shot > 5 years ago AND [2] DIRTY cut or scrape  Answer Assessment - Initial Assessment Questions 1. MECHANISM: How did the injury happen? (e.g., twisting injury, direct blow)      Coming out of the car and rushing, as he was closing door, it hit the left leg and bled 2. ONSET: When did the injury happen? (e.g., minutes, hours ago)      Three weeks ago 3. LOCATION: Where is the injury located?      Left leg 4. APPEARANCE of INJURY: What does the injury look like?  (e.g., deformity of leg)     States issues with cellulitis before. Red, dark leg 5. SEVERITY: Can you put weight on that leg? Can you walk?      yes 6. SIZE: For cuts, bruises, or swelling, ask: How large is it? (e.g., inches or centimeters)      no 7. PAIN: Is there pain? If Yes, ask: How bad is the pain?   What does it keep you from doing? (Scale 0-10; or none, mild, moderate, severe)     Mild to moderate 8. TETANUS: For any breaks in the skin, ask: When was your last tetanus booster?     no 9. OTHER SYMPTOMS: Do you have any  other symptoms?      no 10. PREGNANCY: Is there any chance you are pregnant? When was your last menstrual period?       na  Protocols used: Leg Injury-A-AH

## 2024-03-17 ENCOUNTER — Other Ambulatory Visit: Payer: Self-pay | Admitting: Internal Medicine

## 2024-03-17 ENCOUNTER — Ambulatory Visit (INDEPENDENT_AMBULATORY_CARE_PROVIDER_SITE_OTHER): Admitting: Family Medicine

## 2024-03-17 ENCOUNTER — Encounter: Payer: Self-pay | Admitting: Family Medicine

## 2024-03-17 VITALS — BP 138/86 | HR 77 | Temp 98.6°F | Ht 69.0 in | Wt 315.4 lb

## 2024-03-17 DIAGNOSIS — E118 Type 2 diabetes mellitus with unspecified complications: Secondary | ICD-10-CM

## 2024-03-17 DIAGNOSIS — L03116 Cellulitis of left lower limb: Secondary | ICD-10-CM | POA: Diagnosis not present

## 2024-03-17 MED ORDER — SULFAMETHOXAZOLE-TRIMETHOPRIM 800-160 MG PO TABS: 1.0000 | ORAL_TABLET | Freq: Two times a day (BID) | ORAL | 0 refills | Status: DC

## 2024-03-17 NOTE — Patient Instructions (Signed)
 I have sent in Bactrim  for you to take twice a day for 10 days.  May use soap and water  to keep the area clean.   Follow up with me on Thursday for recheck, sooner if needed.

## 2024-03-17 NOTE — Progress Notes (Signed)
 Acute Office Visit  Subjective:     Patient ID: Derrick [name], male    DOB: 02/19/1981, 43 y.o.   MRN: [id number]  Chief Complaint  Patient presents with   Leg Injury    Right leg, 3 weeks ago. Hit leg on bathroom door, patient states he has had swelling, bruising, a little drainage, and feels tight. He states he has a history of cellulitis in the same leg, has been treating at home with compressions, and neosporin     HPI  Discussed the use of AI scribe software for clinical note transcription with the patient, who gave verbal consent to proceed.  History of Present Illness Derrick [name] is a 43 year old male with a history of cellulitis who presents with swelling and discoloration of the foot.  Lower extremity swelling and discoloration - Swelling and discoloration of the foot have persisted for three weeks. - Pain and tightness are present, especially during ambulation. - Area remains sensitive to touch. - Swelling is more pronounced in one leg. - Compression socks are used to manage swelling. - Recent weight loss from 357 to 356 pounds has not alleviated swelling. - No fever or chills.  Foot injury and vascular complications - Sustained a foot injury from a sprain six weeks ago. - Previous ER visit for foot bleeding due to vein injury. - Area remains sore and slightly red.     ROS Per HPI      Objective:    BP 138/86   Pulse 77   Temp 98.6 F (37 C)   Ht 5' 9 (1.753 m)   Wt (!) 315 lb 6.4 oz (143.1 kg)   SpO2 98%   BMI 46.58 kg/m    Physical Exam Vitals and nursing note reviewed.  Constitutional:      General: He is not in acute distress.    Appearance: Normal appearance. He is obese.  HENT:     Head: Normocephalic and atraumatic.     Right Ear: External ear normal.     Left Ear: External ear normal.     Nose: Nose normal.     Mouth/Throat:     Mouth: Mucous membranes are moist.     Pharynx: Oropharynx is clear.  Eyes:      Extraocular Movements: Extraocular movements intact.  Cardiovascular:     Rate and Rhythm: Normal rate and regular rhythm.     Pulses: Normal pulses.     Heart sounds: Normal heart sounds.  Pulmonary:     Effort: Pulmonary effort is normal. No respiratory distress.     Breath sounds: Normal breath sounds. No wheezing, rhonchi or rales.  Musculoskeletal:        General: Normal range of motion.     Cervical back: Normal range of motion.     Right lower leg: No edema.     Left lower leg: No edema.  Lymphadenopathy:     Cervical: No cervical adenopathy.  Skin:    General: Skin is warm and dry.     Comments: See photo L leg and L foot  Neurological:     General: No focal deficit present.     Mental Status: He is alert and oriented to person, place, and time.  Psychiatric:        Mood and Affect: Mood normal.        Behavior: Behavior normal.        No results found for any visits on 03/17/24.  Assessment & Plan:   Assessment and Plan Assessment & Plan Cellulitis of lower extremity with foot wound Suspected cellulitis in the lower extremity with poor wound healing, discoloration, swelling, pain, and tightness. No systemic symptoms. Allergies to vancomycin and Zosyn noted. - Prescribed Bactrim  twice daily for 10 days. - Scheduled follow-up appointment on Thursday or Friday to reassess the wound.  Edema of lower extremity Persistent painful swelling in one leg, relieved by compression socks. History of weight loss surgery and significant weight loss. - Continue using compression socks to help with swelling     No orders of the defined types were placed in this encounter.    Meds ordered this encounter  Medications   sulfamethoxazole -trimethoprim  (BACTRIM  DS) 800-160 MG tablet    Sig: Take 1 tablet by mouth 2 (two) times daily.    Dispense:  20 tablet    Refill:  0    Return in about 3 days (around 03/20/2024) for recheck leg.  Corean LITTIE Ku, FNP

## 2024-03-19 ENCOUNTER — Other Ambulatory Visit: Payer: Self-pay | Admitting: Emergency Medicine

## 2024-03-19 ENCOUNTER — Telehealth: Payer: Self-pay

## 2024-03-19 NOTE — Telephone Encounter (Signed)
Please advise as MD is out of office

## 2024-03-19 NOTE — Telephone Encounter (Signed)
 Cannot see the message.  Please resend with message available.  Thanks.

## 2024-03-19 NOTE — Telephone Encounter (Signed)
 Copied from CRM [id number]. Topic: Clinical - Medication Question >> Mar 19, 2024  2:20 PM Derrick [name] wrote: Reason for CRM: Patient would like to have Fluconazole  prescribed for [name]. Patient said that when he is prescribed Antibiotics that he gets [name]. If medication is prescribed he would like it to go to [facility] that is in his phamaracy log

## 2024-03-20 ENCOUNTER — Ambulatory Visit (INDEPENDENT_AMBULATORY_CARE_PROVIDER_SITE_OTHER): Admitting: Family Medicine

## 2024-03-20 ENCOUNTER — Encounter: Payer: Self-pay | Admitting: Family Medicine

## 2024-03-20 ENCOUNTER — Other Ambulatory Visit: Payer: Self-pay

## 2024-03-20 ENCOUNTER — Ambulatory Visit: Payer: Self-pay | Admitting: Family Medicine

## 2024-03-20 VITALS — BP 126/82 | HR 67 | Temp 98.1°F | Ht 69.0 in | Wt 311.4 lb

## 2024-03-20 DIAGNOSIS — M7989 Other specified soft tissue disorders: Secondary | ICD-10-CM

## 2024-03-20 DIAGNOSIS — E118 Type 2 diabetes mellitus with unspecified complications: Secondary | ICD-10-CM | POA: Diagnosis not present

## 2024-03-20 DIAGNOSIS — L03116 Cellulitis of left lower limb: Secondary | ICD-10-CM | POA: Diagnosis not present

## 2024-03-20 DIAGNOSIS — I5032 Chronic diastolic (congestive) heart failure: Secondary | ICD-10-CM

## 2024-03-20 LAB — CBC WITH DIFFERENTIAL/PLATELET
Basophils Absolute: 0 K/uL (ref 0.0–0.1)
Basophils Relative: 0.8 % (ref 0.0–3.0)
Eosinophils Absolute: 0.1 K/uL (ref 0.0–0.7)
Eosinophils Relative: 1.1 % (ref 0.0–5.0)
HCT: 33.9 % — ABNORMAL LOW (ref 39.0–52.0)
Hemoglobin: 11.2 g/dL — ABNORMAL LOW (ref 13.0–17.0)
Lymphocytes Relative: 29.5 % (ref 12.0–46.0)
Lymphs Abs: 1.6 K/uL (ref 0.7–4.0)
MCHC: 33.1 g/dL (ref 30.0–36.0)
MCV: 88.7 fl (ref 78.0–100.0)
Monocytes Absolute: 0.5 K/uL (ref 0.1–1.0)
Monocytes Relative: 8.3 % (ref 3.0–12.0)
Neutro Abs: 3.3 K/uL (ref 1.4–7.7)
Neutrophils Relative %: 60.3 % (ref 43.0–77.0)
Platelets: 190 K/uL (ref 150.0–400.0)
RBC: 3.82 Mil/uL — ABNORMAL LOW (ref 4.22–5.81)
RDW: 14.3 % (ref 11.5–15.5)
WBC: 5.5 K/uL (ref 4.0–10.5)

## 2024-03-20 LAB — COMPREHENSIVE METABOLIC PANEL WITH GFR
ALT: 25 U/L (ref 0–53)
AST: 21 U/L (ref 0–37)
Albumin: 3.4 g/dL — ABNORMAL LOW (ref 3.5–5.2)
Alkaline Phosphatase: 92 U/L (ref 39–117)
BUN: 9 mg/dL (ref 6–23)
CO2: 34 meq/L — ABNORMAL HIGH (ref 19–32)
Calcium: 7.9 mg/dL — ABNORMAL LOW (ref 8.4–10.5)
Chloride: 102 meq/L (ref 96–112)
Creatinine, Ser: 1.28 mg/dL (ref 0.40–1.50)
GFR: 69.37 mL/min (ref >60.00)
Glucose, Bld: 74 mg/dL (ref 70–99)
Potassium: 3 meq/L — ABNORMAL LOW (ref 3.5–5.1)
Sodium: 143 meq/L (ref 135–145)
Total Bilirubin: 0.3 mg/dL (ref 0.2–1.2)
Total Protein: 6.5 g/dL (ref 6.0–8.3)

## 2024-03-20 LAB — LIPID PANEL
Cholesterol: 111 mg/dL (ref 0–200)
HDL: 39.8 mg/dL (ref >39.00)
LDL Cholesterol: 58 mg/dL (ref 0–99)
NonHDL: 71.66
Total CHOL/HDL Ratio: 3
Triglycerides: 67 mg/dL (ref 0.0–149.0)
VLDL: 13.4 mg/dL (ref 0.0–40.0)

## 2024-03-20 LAB — HEMOGLOBIN A1C: Hgb A1c MFr Bld: 5.4 % (ref 4.6–6.5)

## 2024-03-20 LAB — MICROALBUMIN / CREATININE URINE RATIO
Creatinine,U: 55.6 mg/dL
Microalb Creat Ratio: UNDETERMINED mg/g (ref 0.0–30.0)
Microalb, Ur: <0.7 mg/dL

## 2024-03-20 LAB — BRAIN NATRIURETIC PEPTIDE: Pro B Natriuretic peptide (BNP): 67 pg/mL (ref 0.0–100.0)

## 2024-03-20 MED ORDER — FLUCONAZOLE 150 MG PO TABS: 150.0000 mg | ORAL_TABLET | Freq: Once | ORAL | 0 refills | Status: AC

## 2024-03-20 NOTE — Patient Instructions (Signed)
 Continue course of antibiotics.   Continue to keep the area clean with warm soapy water .   You are healing well.   We are checking labs today, will be in contact with any results that require further attention  Follow-up with me for new or worsening symptoms.

## 2024-03-20 NOTE — Progress Notes (Signed)
 Acute Office Visit  Subjective:     Patient ID: Derrick [name], male    DOB: 06-06-1981, 43 y.o.   MRN: [id number]  Chief Complaint  Patient presents with   Follow-up    HPI  Discussed the use of AI scribe software for clinical note transcription with the patient, who gave verbal consent to proceed.  History of Present Illness Derrick [name] is a 43 year old male who presents with persistent leg swelling despite significant weight loss.  Unilateral lower extremity edema - Persistent swelling in left leg, more pronounced compared to the contralateral leg - No similar swelling in the other leg - History of pain in the affected leg, with recent improvement in pain - No recent laboratory work, ultrasounds, or vascular studies performed to evaluate the swelling  Weight loss - Significant weight loss from 480 pounds to 310 pounds over the past nine years  Renal function monitoring - Kidney function has been frequently monitored, particularly after a second surgery in March  Glycemic control concerns - Concern regarding A1c levels  Cellulitis - Reports compliance with antibiotics     ROS Per HPI      Objective:    BP 126/82 (BP Location: Left Arm, Patient Position: Sitting)   Pulse 67   Temp 98.1 F (36.7 C) (Temporal)   Ht 5' 9 (1.753 m)   Wt (!) 311 lb 6.4 oz (141.3 kg)   SpO2 98%   BMI 45.99 kg/m     Physical Exam Vitals and nursing note reviewed.  Constitutional:      General: He is not in acute distress.    Appearance: Normal appearance. He is obese.  HENT:     Head: Normocephalic and atraumatic.     Right Ear: External ear normal.     Left Ear: External ear normal.  Nose: Nose normal.     Mouth/Throat:     Mouth: Mucous membranes are moist.     Pharynx: Oropharynx is clear.  Eyes:     Extraocular Movements: Extraocular movements intact.  Cardiovascular:     Rate and Rhythm: Normal rate and regular rhythm.     Pulses: Normal pulses.  Pulmonary:     Effort: Pulmonary effort is normal.  Musculoskeletal:     Cervical back: Normal range of motion.     Right lower leg: Edema (2+ pitting) present.     Left lower leg: Edema (3+ pitting) present.     Comments: See photo of L lower leg  Lymphadenopathy:     Cervical: No cervical adenopathy.  Skin:    General: Skin is warm and dry.  Neurological:     General: No focal deficit present.     Mental Status: He is alert and oriented to person, place, and time.  Psychiatric:        Mood and Affect: Mood normal.        Behavior: Behavior normal.     No results found for any visits on 03/20/24.      Assessment & Plan:   Assessment and Plan Assessment & Plan Cellulitis of left lower extremity with foot wound Cellulitis improving with decreased pain and lighter skin around the wound. - Continue antibiotics - Apply a new dressing to the wound. - Photograph the wound for monitoring.  Edema of left lower extremity Persistent edema despite weight loss. Differential includes renal, electrolyte, or vascular issues. No prior ultrasounds or vascular assessments. - Order lab tests to evaluate renal function, cardiac enzyme levels, and electrolytes. - Consider referral to a vascular specialist if lab results are normal.     Orders Placed This Encounter  Procedures   CBC with Differential/Platelet    Release to patient:    Immediate [1]   Comprehensive metabolic panel with GFR    Release to patient:   Immediate [1]   Hemoglobin A1c   Lipid panel   Microalbumin / creatinine urine ratio    Release to patient:   Immediate   B Nat Peptide     Meds ordered this encounter  Medications   fluconazole  (DIFLUCAN ) 150 MG tablet    Sig: Take 1 tablet (150 mg total) by mouth once for 1 dose.    Dispense:  1 tablet    Refill:  0    Return if symptoms worsen or fail to improve.  Corean LITTIE Ku, FNP

## 2024-04-20 ENCOUNTER — Other Ambulatory Visit: Payer: Self-pay | Admitting: Internal Medicine

## 2024-04-20 DIAGNOSIS — E118 Type 2 diabetes mellitus with unspecified complications: Secondary | ICD-10-CM

## 2024-05-29 ENCOUNTER — Ambulatory Visit: Payer: Self-pay

## 2024-05-29 ENCOUNTER — Other Ambulatory Visit: Payer: Self-pay | Admitting: Vascular Surgery

## 2024-05-29 DIAGNOSIS — M7989 Other specified soft tissue disorders: Secondary | ICD-10-CM

## 2024-05-29 NOTE — Telephone Encounter (Signed)
 FYI Only or Action Required?: FYI only for provider: appointment scheduled on 05/30/24.  Patient was last seen in primary care on 03/20/2024 by Alvia Corean CROME, FNP.  Called Nurse Triage reporting Recurrent Skin Infections.  Symptoms began several days ago.  Interventions attempted: Other: compression socks.  Symptoms are: left leg pain, redness, warmth to back of knee gradually worsening.  Triage Disposition: See PCP When Office is Open (Within 3 Days)  Patient/caregiver understands and will follow disposition?: Yes           Copied from CRM #[id number]. Topic: Clinical - Red Word Triage >> May 29, 2024  2:18 PM Paige D wrote: Red Word that prompted transfer to Nurse Triage: Flare up from Cellulitis L side leg painful/Red Reason for Disposition  [1] MODERATE pain (e.g., interferes with normal activities, limping) AND [2] present > 3 days  Answer Assessment - Initial Assessment Questions 1. ONSET: When did the pain start?      Tuesday.  2. LOCATION: Where is the pain located?      Left leg, behind the knee.  3. PAIN: How bad is the pain?    (Scale 1-10; or mild, moderate, severe)     3-4/10.  4. WORK OR EXERCISE: Has there been any recent work or exercise that involved this part of the body?      No.  5. CAUSE: What do you think is causing the leg pain?     Cellulitis. Patient also states he fell asleep with his compression socks on the other night and thought that was causing the pain.  6. OTHER SYMPTOMS: Do you have any other symptoms? (e.g., chest pain, back pain, breathing difficulty, swelling, rash, fever, numbness, weakness)     Redness and warmth in left leg behind knee (unable to say how large it is); reports he thinks he had a fever last night because his shirt was wet when he woke up, cough, nasal congestion (treated with Dayquil and Nyquil). Denies leg swelling, chest pain, SOB, rash, blisters.  Protocols used: Leg Pain-A-AH

## 2024-05-30 ENCOUNTER — Encounter: Payer: Self-pay | Admitting: Family Medicine

## 2024-05-30 ENCOUNTER — Ambulatory Visit: Payer: Self-pay | Admitting: Family Medicine

## 2024-05-30 VITALS — BP 130/84 | HR 74 | Temp 98.2°F | Ht 69.0 in | Wt 300.8 lb

## 2024-05-30 DIAGNOSIS — M25562 Pain in left knee: Secondary | ICD-10-CM

## 2024-05-30 DIAGNOSIS — M79662 Pain in left lower leg: Secondary | ICD-10-CM

## 2024-05-30 DIAGNOSIS — L03116 Cellulitis of left lower limb: Secondary | ICD-10-CM

## 2024-05-30 DIAGNOSIS — E538 Deficiency of other specified B group vitamins: Secondary | ICD-10-CM

## 2024-05-30 DIAGNOSIS — R0981 Nasal congestion: Secondary | ICD-10-CM

## 2024-05-30 LAB — COMPREHENSIVE METABOLIC PANEL WITH GFR
ALT: 20 U/L (ref 0–53)
AST: 17 U/L (ref 0–37)
Albumin: 3.7 g/dL (ref 3.5–5.2)
Alkaline Phosphatase: 89 U/L (ref 39–117)
BUN: 17 mg/dL (ref 6–23)
CO2: 31 meq/L (ref 19–32)
Calcium: 8.5 mg/dL (ref 8.4–10.5)
Chloride: 104 meq/L (ref 96–112)
Creatinine, Ser: 1.32 mg/dL (ref 0.40–1.50)
GFR: 66.77 mL/min (ref >60.00)
Glucose, Bld: 90 mg/dL (ref 70–99)
Potassium: 3.4 meq/L — ABNORMAL LOW (ref 3.5–5.1)
Sodium: 140 meq/L (ref 135–145)
Total Bilirubin: 0.3 mg/dL (ref 0.2–1.2)
Total Protein: 7.1 g/dL (ref 6.0–8.3)

## 2024-05-30 LAB — CBC WITH DIFFERENTIAL/PLATELET
Basophils Absolute: 0 K/uL (ref 0.0–0.1)
Basophils Relative: 0.6 % (ref 0.0–3.0)
Eosinophils Absolute: 0 K/uL (ref 0.0–0.7)
Eosinophils Relative: 0.6 % (ref 0.0–5.0)
HCT: 34.8 % — ABNORMAL LOW (ref 39.0–52.0)
Hemoglobin: 11.5 g/dL — ABNORMAL LOW (ref 13.0–17.0)
Lymphocytes Relative: 25.7 % (ref 12.0–46.0)
Lymphs Abs: 1.9 K/uL (ref 0.7–4.0)
MCHC: 33 g/dL (ref 30.0–36.0)
MCV: 87.7 fl (ref 78.0–100.0)
Monocytes Absolute: 0.8 K/uL (ref 0.1–1.0)
Monocytes Relative: 11.6 % (ref 3.0–12.0)
Neutro Abs: 4.5 K/uL (ref 1.4–7.7)
Neutrophils Relative %: 61.5 % (ref 43.0–77.0)
Platelets: 202 K/uL (ref 150.0–400.0)
RBC: 3.97 Mil/uL — ABNORMAL LOW (ref 4.22–5.81)
RDW: 14.4 % (ref 11.5–15.5)
WBC: 7.3 K/uL (ref 4.0–10.5)

## 2024-05-30 LAB — POCT INFLUENZA A/B
Influenza A, POC: NEGATIVE
Influenza B, POC: NEGATIVE

## 2024-05-30 LAB — POC COVID19 BINAXNOW: SARS Coronavirus 2 Ag: NEGATIVE

## 2024-05-30 LAB — D-DIMER, QUANTITATIVE: D-Dimer, Quant: 0.3 ug{FEU}/mL (ref <0.50)

## 2024-05-30 MED ORDER — SULFAMETHOXAZOLE-TRIMETHOPRIM 800-160 MG PO TABS: 1.0000 | ORAL_TABLET | Freq: Two times a day (BID) | ORAL | 0 refills | Status: DC

## 2024-05-30 MED ORDER — CYANOCOBALAMIN 1000 MCG/ML IJ SOLN
1000.0000 ug | Freq: Once | INTRAMUSCULAR | Status: AC
  Administered 2024-05-30: 1000 ug via INTRAMUSCULAR

## 2024-05-30 NOTE — Patient Instructions (Addendum)
 I have sent in an antibiotic for you to take 1 tablet by mouth twice a day for 10 days. Please eat with this medication, it can upset your stomach if you do not. Take all of the medication even if you are feeling better.  We are checking labs today, will be in contact with any results that require further attention.  We have given you a B12 injection today  Follow-up with me for new or worsening symptoms.

## 2024-05-30 NOTE — Progress Notes (Signed)
 Acute Office Visit  Subjective:     Patient ID: Derrick [name], male    DOB: 21-Aug-1980, 43 y.o.   MRN: [id number]  Chief Complaint  Patient presents with   Leg Pain   Nasal Congestion    Leg Pain     Discussed the use of AI scribe software for clinical note transcription with the patient, who gave verbal consent to proceed.  History of Present Illness Derrick [name] is a 43 year old male who presents with left leg pain and swelling.  Lower extremity pain and swelling - Onset of pain and swelling in the leg since Tuesday after wearing pressure socks overnight - Pain is located higher on the leg than a previous episode and extends behind the knee - Associated symptoms include chills and sweats, suggesting possible fever, though temperature not measured - History of similar symptoms in August when weight was 315 pounds; current weight is 300 pounds - Concern for possible clot or infection due to recurrence and symptom pattern - Tylenol  provides some relief of symptoms  Fatigue and low energy - Low energy levels present - Previously found B12 injections effective for improving energy - Currently taking a vitamin containing B12, but uncertain if it is as effective as injections     ROS Per HPI      Objective:    BP 130/84 Comment: REPEAT  Pulse 74   Temp 98.2 F (36.8 C) (Temporal)   Ht 5' 9 (1.753 m)   Wt (!) 300 lb 12.8 oz (136.4 kg)   SpO2 94%   BMI 44.42 kg/m    Physical Exam Vitals and nursing note reviewed.  Constitutional:      General: He is not in acute distress.    Appearance: Normal appearance.  HENT:     Head: Normocephalic and atraumatic.     Right Ear: External ear normal.     Left Ear: External ear normal.     Nose: Nose normal.     Mouth/Throat:     Mouth: Mucous membranes are moist.     Pharynx: Oropharynx is clear.  Eyes:     Extraocular Movements: Extraocular movements intact.  Cardiovascular:     Rate and Rhythm:  Normal rate and regular rhythm.     Pulses: Normal pulses.     Heart sounds: Normal heart sounds.  Pulmonary:     Effort: Pulmonary effort is normal. No respiratory distress.     Breath sounds: Normal breath sounds. No wheezing, rhonchi or rales.  Musculoskeletal:        General: Normal range of motion.     Cervical back: Normal range of motion.     Right lower leg: No edema.     Left lower leg: No edema.  Lymphadenopathy:     Cervical: No cervical adenopathy.  Skin:    General: Skin is warm and dry.         Comments: Area of erythema, heat, tenderness, swelling.  No lesions or discharge noted  Neurological:     General: No focal deficit present.     Mental Status: He is alert and oriented to person, place, and time.  Psychiatric:        Mood and Affect: Mood normal.        Behavior: Behavior normal.     Results for orders placed or performed in visit on 05/30/24  POC COVID-19 BinaxNow  Result Value Ref Range   SARS Coronavirus 2 Ag Negative Negative  POCT  Influenza A/B  Result Value Ref Range   Influenza A, POC Negative Negative   Influenza B, POC Negative Negative        Assessment & Plan:   Assessment and Plan Assessment & Plan Cellulitis of left lower extremity with localized edema and pain Recurrent cellulitis with edema and pain extending higher than previous episodes. Symptoms suggest infection over clot. - Continue acetaminophen  for pain management. - Prescribe generic antibiotic as previous. - Order labs for infection and D-dimer to rule out clot.  Nasal Congestion - Flu and COVID testing was negative. Continue supportive care at home  Deficiency of B group vitamins Reports low energy and previous benefit from B12 injections. - Administer B12 injection.     Orders Placed This Encounter  Procedures   CBC with Differential/Platelet    Release to patient:   Immediate [1]   Comprehensive metabolic panel with GFR    Release to patient:   Immediate  [1]   D-dimer, quantitative    Release to patient:   Immediate [1]   POC COVID-19 BinaxNow   POCT Influenza A/B     Meds ordered this encounter  Medications   sulfamethoxazole -trimethoprim  (BACTRIM  DS) 800-160 MG tablet    Sig: Take 1 tablet by mouth 2 (two) times daily.    Dispense:  20 tablet    Refill:  0   cyanocobalamin  (VITAMIN B12) injection 1,000 mcg    Return if symptoms worsen or fail to improve.  Corean LITTIE Ku, FNP

## 2024-06-01 ENCOUNTER — Ambulatory Visit: Payer: Self-pay | Admitting: Family Medicine

## 2024-06-19 ENCOUNTER — Other Ambulatory Visit: Payer: Self-pay | Admitting: Internal Medicine

## 2024-07-15 NOTE — Progress Notes (Deleted)
 Requested by:  Alvia Corean CROME, FNP [address] [place],  [place] [address]  Reason for consultation: left leg swelling and cellulitis    History of Present Illness   Derrick [name] is a 43 y.o. (1981/03/23) male who presents for evaluation of ***  Venous symptoms include: (aching, heavy, tired, throbbing, burning, itching, swelling, bleeding, ulcer)  *** Onset/duration:  ***  Occupation:  *** Aggravating factors: (sitting, standing) Alleviating factors: (elevation) Compression:  *** Helps:  *** Pain medications:  *** Previous vein procedures:  *** History of DVT:  ***  Past Medical History:  Diagnosis Date   Acute on chronic diastolic CHF (congestive heart failure), NYHA class 1 (HCC)    Diabetes mellitus without complication (HCC)    GERD (gastroesophageal reflux disease)    Hypertension    Hypoxemia    Morbid obesity (HCC)    Obesity hypoventilation syndrome (HCC)    OSA (obstructive sleep apnea)    Reflux     Past Surgical History:  Procedure Laterality Date   TRACHEOSTOMY TUBE PLACEMENT N/A 06/21/2015   Procedure: TRACHEOSTOMY;  Surgeon: Daniel Moccasin, MD;  Location: [facility] OR;  Service: ENT;  Laterality: N/A;    Social History   Socioeconomic History   Marital status: Single    Spouse name: Not on file   Number of children: Not on file   Years of education: Not on file   Highest education level: Not on file  Occupational History   Not on file  Tobacco Use   Smoking status: Never   Smokeless tobacco: Never  Substance and Sexual Activity   Alcohol use: No   Drug use: No   Sexual activity: Not on file  Other Topics Concern   Not on file  Social History Narrative   Not on file   Social Drivers of Health   Tobacco Use: Low Risk (05/30/2024)   Patient History    Smoking Tobacco Use: Never    Smokeless Tobacco Use: Never    Passive Exposure: Not on file  Financial Resource Strain: Low Risk (08/02/2023)   Received from [facility]   Overall Financial Resource Strain (CARDIA)    Difficulty of Paying Living Expenses: Not hard at all  Food Insecurity: No Food Insecurity (10/23/2023)   Received from [facility]   Epic    Within the past 12 months, you worried that your food would run out before you got the money to buy more.: Never true    Within the past 12 months, the food you bought just didn't last and you didn't have money to get more.: Never true  Transportation Needs: No Transportation Needs (10/23/2023)   Received from [facility] - Transportation    Lack of Transportation (Medical): No    Lack of Transportation (Non-Medical): No  Physical Activity: Insufficiently Active (08/02/2023)   Received from [facility]   Exercise Vital Sign    On average, how many days per week do you engage in moderate to strenuous exercise (like a brisk walk)?: 3 days    On average, how many minutes do you engage in exercise at this level?: 20 min  Stress: No Stress Concern Present (10/23/2023)   Received from [facility] - Occupational Stress Questionnaire    Feeling of Stress : Only a little  Recent Concern: Stress - Stress Concern Present (08/02/2023)   Received from [facility] - Occupational Stress Questionnaire    Feeling of Stress : To some extent  Social Connections: Socially Integrated (08/02/2023)   Received from [facility]   Social Network    How would you rate your social network (family, work, friends)?: Good participation with social networks  Intimate Partner Violence: Not At Risk (10/23/2023)   Received from [facility]   HITS    Over the last 12 months how often did your partner physically hurt you?: Never    Over the last 12 months how often did your partner insult you or talk down to you?: Never    Over the last 12 months how often did your partner threaten you with physical harm?: Never    Over the  last 12 months how often did your partner scream or curse at you?: Never  Depression (PHQ2-9): Low Risk (05/30/2024)   Depression (PHQ2-9)    PHQ-2 Score: 3  Alcohol Screen: Not on file  Housing: Low Risk (10/23/2023)   Received [facility]    In the last 12 months, was there a time when you were not able to pay the mortgage or rent on time?: No    In the past 12 months, how many times have you moved where you were living?: 0    At any time in the past 12 months, were you homeless or living in a shelter (including now)?: No  Utilities: Not At Risk (10/23/2023)   Received from [facility] Utilities    Threatened with loss of utilities: No  Health Literacy: Not on file   *** Family History  Problem Relation Age of Onset   Diabetes Maternal Grandmother    Hypertension Maternal Grandmother    Asthma Other    Cancer Other    Stroke Other    Heart disease Other     Current Outpatient Medications  Medication Sig Dispense Refill   acetaminophen  (TYLENOL ) 325 MG tablet Take 2 tablets (650 mg total) by mouth every 6 (six) hours as needed for mild pain or headache (fever >/= 101).     albuterol  (VENTOLIN  HFA) 108 (90 Base) MCG/ACT inhaler Inhale 2 puffs into the lungs every 6 (six) hours as needed for wheezing or shortness of breath. 1 each 0   allopurinol  (ZYLOPRIM ) 300 MG tablet Take 1 tablet by mouth once daily 90 tablet 0   azelastine  (ASTELIN ) 0.1 % nasal spray Place 2 sprays into both nostrils 2 (two) times daily. Use in each nostril as directed 30 mL 0   bumetanide  (BUMEX ) 0.5 MG tablet Take 1 tablet (0.5 mg total) by mouth daily. 30 tablet 3   clindamycin  (CLINDAGEL ) 1 % gel Apply 1 application topically daily.     cyanocobalamin  (VITAMIN B12) 1000 MCG tablet Take 1 tablet (1,000 mcg total) by mouth daily. 90 tablet 3   dextromethorphan -guaiFENesin  (MUCINEX  DM) 30-600 MG 12hr tablet Take 1 tablet by mouth 2 (two) times daily. 60 tablet 6   gabapentin  (NEURONTIN )  300 MG capsule Take 3 capsules (900 mg total) by mouth 3 (three) times daily. 270 capsule 0   gabapentin  (NEURONTIN ) 300 MG capsule TAKE 3 CAPSULES BY MOUTH THREE TIMES DAILY 270 capsule 0   levocetirizine (XYZAL ) 5 MG tablet Take 1 tablet (5 mg total) by mouth every evening. 30 tablet 1   metoCLOPramide (REGLAN) 5 MG/5ML solution Take 10 mg by mouth 4 (four) times daily.     metoprolol  tartrate (LOPRESSOR ) 50 MG tablet Take  1 tablet (50 mg total) by mouth 2 (two) times daily. 180 tablet 0   metoprolol  tartrate (LOPRESSOR ) 50 MG tablet Take 1 tablet by mouth twice daily 180 tablet 0   montelukast  (SINGULAIR ) 10 MG tablet Take 1 tablet (10 mg total) by mouth at bedtime. 90 tablet 3   nystatin  (MYCOSTATIN /NYSTOP ) powder Apply 1 Application topically 3 (three) times daily. 120 g 11   omeprazole (PRILOSEC) 40 MG capsule Take 40 mg by mouth daily.     ondansetron  (ZOFRAN ) 4 MG tablet Take 1 tablet (4 mg total) by mouth every 8 (eight) hours as needed for nausea or vomiting. 20 tablet 0   ONETOUCH DELICA LANCETS 33G MISC Use as needed daily 100 each 11   potassium chloride  (KLOR-CON ) 20 MEQ packet Take 20 mEq by mouth daily as needed (take with lasix  only). 30 packet 2   Spacer/Aero-Holding Chambers (PRO COMFORT SPACER ADULT) MISC UAD with inhaler 1 each 0   sulfamethoxazole -trimethoprim  (BACTRIM  DS) 800-160 MG tablet Take 1 tablet by mouth 2 (two) times daily. 20 tablet 0   tadalafil  (CIALIS ) 10 MG tablet Take 1 tablet (10 mg total) by mouth every other day as needed for erectile dysfunction. 10 tablet 0   ursodiol (ACTIGALL) 300 MG capsule Take 300 mg by mouth 2 (two) times daily.     No current facility-administered medications for this visit.    Allergies[1]  ***REVIEW OF SYSTEMS (negative unless checked):   Cardiac:  []  Chest pain or chest pressure? []  Shortness of breath upon activity? []  Shortness of breath when lying flat? []  Irregular heart rhythm?  Vascular:  []  Pain in calf,  thigh, or hip brought on by walking? []  Pain in feet at night that wakes you up from your sleep? []  Blood clot in your veins? []  Leg swelling?  Pulmonary:  []  Oxygen at home? []  Productive cough? []  Wheezing?  Neurologic:  []  Sudden weakness in arms or legs? []  Sudden numbness in arms or legs? []  Sudden onset of difficult speaking or slurred speech? []  Temporary loss of vision in one eye? []  Problems with dizziness?  Gastrointestinal:  []  Blood in stool? []  Vomited blood?  Genitourinary:  []  Burning when urinating? []  Blood in urine?  Psychiatric:  []  Major depression  Hematologic:  []  Bleeding problems? []  Problems with blood clotting?  Dermatologic:  []  Rashes or ulcers?  Constitutional:  []  Fever or chills?  Ear/Nose/Throat:  []  Change in hearing? []  Nose bleeds? []  Sore throat?  Musculoskeletal:  []  Back pain? []  Joint pain? []  Muscle pain?   Physical Examination    There were no vitals filed for this visit. There is no height or weight on file to calculate BMI.  General:  WDWN in NAD; vital signs documented above Gait: Not observed HENT: WNL, normocephalic Pulmonary: normal non-labored breathing , without Rales, rhonchi,  wheezing Cardiac: {Desc; regular/irreg:14544} HR Abdomen: soft Vascular Exam/Pulses: Extremities: {With/Without:20273} varicose veins, {With/Without:20273} reticular veins, {With/Without:20273} edema, {With/Without:20273} stasis pigmentation, {With/Without:20273} lipodermatosclerosis, {With/Without:20273} ulcers Musculoskeletal: no muscle wasting or atrophy  Neurologic: A&O X 3;  No focal weakness or paresthesias are detected Psychiatric:  The pt has Normal affect.  Non-invasive Vascular Imaging   BLE Venous Insufficiency Duplex (***):  RLE:  *** DVT and SVT,  *** GSV reflux ***, GSV diameter *** *** SSV reflux ***, *** deep venous reflux  LLE: *** DVT and SVT,  *** GSV reflux ***,  GSV diameter *** *** SSV reflux  ***, *** deep venous reflux  Medical Decision Making   Derrick [name] is a 43 y.o. male who presents with: ***LE chronic venous insufficiency, ***varicose veins with complications  Based on the patient's history and examination, I recommend: ***. I discussed with the patient the use of her 20-30 mm thigh high compression stockings and need for 3 month trial of such. The patient will follow up in 3 months with Dr. FERNAND Thank you for allowing us  to participate in this patient's care.   Teretha Damme, PA-C [facility] Office: [phone]  07/15/2024, 11:52 AM  Clinic MD: Sheree     [1]  Allergies Allergen Reactions   Vancomycin Other (See Comments)    Affected kidneys    Shrimp [Shellfish Allergy]    Zosyn [Piperacillin Sod-Tazobactam So] Other (See Comments)

## 2024-07-16 ENCOUNTER — Ambulatory Visit (HOSPITAL_COMMUNITY): Payer: Self-pay | Attending: Family Medicine

## 2024-07-16 ENCOUNTER — Ambulatory Visit (HOSPITAL_COMMUNITY): Payer: Self-pay

## 2024-07-21 ENCOUNTER — Encounter (HOSPITAL_BASED_OUTPATIENT_CLINIC_OR_DEPARTMENT_OTHER): Payer: Self-pay

## 2024-07-21 ENCOUNTER — Emergency Department (HOSPITAL_BASED_OUTPATIENT_CLINIC_OR_DEPARTMENT_OTHER)

## 2024-07-21 ENCOUNTER — Ambulatory Visit: Payer: Self-pay | Admitting: *Deleted

## 2024-07-21 ENCOUNTER — Other Ambulatory Visit: Payer: Self-pay

## 2024-07-21 ENCOUNTER — Emergency Department (HOSPITAL_BASED_OUTPATIENT_CLINIC_OR_DEPARTMENT_OTHER)
Admission: EM | Admit: 2024-07-21 | Discharge: 2024-07-21 | Disposition: A | Discharge status: Home / Self Care | Source: Home / Self Care | Attending: Emergency Medicine | Admitting: Emergency Medicine

## 2024-07-21 DIAGNOSIS — I509 Heart failure, unspecified: Secondary | ICD-10-CM | POA: Insufficient documentation

## 2024-07-21 DIAGNOSIS — E876 Hypokalemia: Secondary | ICD-10-CM | POA: Insufficient documentation

## 2024-07-21 DIAGNOSIS — L03116 Cellulitis of left lower limb: Secondary | ICD-10-CM | POA: Insufficient documentation

## 2024-07-21 DIAGNOSIS — I110 Hypertensive heart disease with heart failure: Secondary | ICD-10-CM | POA: Insufficient documentation

## 2024-07-21 DIAGNOSIS — Z79899 Other long term (current) drug therapy: Secondary | ICD-10-CM | POA: Insufficient documentation

## 2024-07-21 DIAGNOSIS — E119 Type 2 diabetes mellitus without complications: Secondary | ICD-10-CM | POA: Insufficient documentation

## 2024-07-21 HISTORY — DX: Cellulitis, unspecified: L03.90

## 2024-07-21 LAB — CBC WITH DIFFERENTIAL/PLATELET
Abs Immature Granulocytes: 0.02 K/uL (ref 0.00–0.07)
Basophils Absolute: 0 K/uL (ref 0.0–0.1)
Basophils Relative: 0 %
Eosinophils Absolute: 0.1 K/uL (ref 0.0–0.5)
Eosinophils Relative: 1 %
HCT: 34 % — ABNORMAL LOW (ref 39.0–52.0)
Hemoglobin: 11 g/dL — ABNORMAL LOW (ref 13.0–17.0)
Immature Granulocytes: 0 %
Lymphocytes Relative: 19 %
Lymphs Abs: 1.7 K/uL (ref 0.7–4.0)
MCH: 29.3 pg (ref 26.0–34.0)
MCHC: 32.4 g/dL (ref 30.0–36.0)
MCV: 90.7 fL (ref 80.0–100.0)
Monocytes Absolute: 0.9 K/uL (ref 0.1–1.0)
Monocytes Relative: 10 %
Neutro Abs: 6.2 K/uL (ref 1.7–7.7)
Neutrophils Relative %: 70 %
Platelets: 189 K/uL (ref 150–400)
RBC: 3.75 MIL/uL — ABNORMAL LOW (ref 4.22–5.81)
RDW: 14 % (ref 11.5–15.5)
WBC: 8.9 K/uL (ref 4.0–10.5)
nRBC: 0 % (ref 0.0–0.2)

## 2024-07-21 LAB — COMPREHENSIVE METABOLIC PANEL WITH GFR
ALT: 20 U/L (ref 0–44)
AST: 25 U/L (ref 15–41)
Albumin: 3.6 g/dL (ref 3.5–5.0)
Alkaline Phosphatase: 96 U/L (ref 38–126)
Anion gap: 9 (ref 5–15)
BUN: 15 mg/dL (ref 6–20)
CO2: 28 mmol/L (ref 22–32)
Calcium: 8.5 mg/dL — ABNORMAL LOW (ref 8.9–10.3)
Chloride: 105 mmol/L (ref 98–111)
Creatinine, Ser: 1.06 mg/dL (ref 0.61–1.24)
GFR, Estimated: >60 mL/min
Glucose, Bld: 104 mg/dL — ABNORMAL HIGH (ref 70–99)
Potassium: 3.3 mmol/L — ABNORMAL LOW (ref 3.5–5.1)
Sodium: 141 mmol/L (ref 135–145)
Total Bilirubin: 0.4 mg/dL (ref 0.0–1.2)
Total Protein: 6.7 g/dL (ref 6.5–8.1)

## 2024-07-21 MED ORDER — SULFAMETHOXAZOLE-TRIMETHOPRIM 800-160 MG PO TABS: 1.0000 | ORAL_TABLET | Freq: Two times a day (BID) | ORAL | 0 refills | Status: AC

## 2024-07-21 MED ORDER — POTASSIUM CHLORIDE CRYS ER 20 MEQ PO TBCR
20.0000 meq | EXTENDED_RELEASE_TABLET | Freq: Once | ORAL | Status: AC
  Administered 2024-07-21: 20 meq via ORAL
  Filled 2024-07-21: qty 1

## 2024-07-21 MED ORDER — SODIUM CHLORIDE 0.9 % IV SOLN
2.0000 g | Freq: Three times a day (TID) | INTRAVENOUS | Status: DC
  Administered 2024-07-21: 2 g via INTRAVENOUS
  Filled 2024-07-21: qty 12.5

## 2024-07-21 NOTE — Discharge Instructions (Addendum)
 Thank you for visiting the Emergency Department today. It was a pleasure to be part of your healthcare team.  Your overall workup was reassuring, you were treated for cellulitis with IV antibiotics in the ED and you have been prescribed to outpatient antibiotics.  You should take your medications as directed. If you have any questions about your medicines, please call your pharmacy or healthcare provider. As discussed, keep the area clean and dry.  It is important to watch for warning signs such as worsening pain, fever, redness, or swelling - If any of these happen, return to the Emergency Department or call 911. Thank you for trusting us  with your health.

## 2024-07-21 NOTE — ED Provider Notes (Signed)
 " [place] Provider Note   CSN: [id number] Arrival date & time: 07/21/24  1520     Patient presents with: Leg Swelling (Foot/)   Derrick [name] is a 43 y.o. male with a history of CHF, diabetes, hypertension, obesity, and recurrent cellulitis who presents to the ED with left leg pain and swelling that began 4 days ago.  Patient states he noticed the swelling initially after wearing new boots during the day on Thursday - during the evening he noticed a small painful ulcer on the bottom of the left foot to which he applied some antibiotic cream.  The left lower extremity has been increasing in size since the event. Patient denies any fever or chills.  The patient denies any neurological changes such as numbness or tingling to the lower extremity.  Patient states that he has had recurrent cellulitis to his lower extremities with last treatment on 05/30/2024.  No recent travel. No sick contacts.  The patient is in no acute distress.   HPI     Prior to Admission medications  Medication Sig Start Date End Date Taking? Authorizing Provider  cephALEXin  (KEFLEX ) 500 MG capsule Take 1 capsule (500 mg total) by mouth 3 (three) times daily for 7 days. 07/21/24 07/28/24 Yes Dula Havlik L, PA  sulfamethoxazole -trimethoprim  (BACTRIM  DS) 800-160 MG tablet Take 1 tablet by mouth 2 (two) times daily for 7 days. 07/21/24 07/28/24 Yes Rune Mendez L, PA  acetaminophen  (TYLENOL ) 325 MG tablet Take 2 tablets (650 mg total) by mouth every 6 (six) hours as needed for mild pain or headache (fever >/= 101). 06/21/19   Danton Reyes DASEN, MD  albuterol  (VENTOLIN  HFA) [phone] Base) MCG/ACT inhaler Inhale 2 puffs into the lungs every 6 (six) hours as needed for wheezing or shortness of breath. 11/15/23   Elnor Lauraine BRAVO, NP  allopurinol  (ZYLOPRIM ) 300 MG tablet Take 1 tablet by mouth once daily Patient taking differently: Take 300 mg by mouth as needed. 09/11/23    [name], MD  azelastine  (ASTELIN ) 0.1 % nasal spray Place 2 sprays into both nostrils 2 (two) times daily. Use in each nostril as directed Patient taking differently: Place 2 sprays into both nostrils as needed for rhinitis or allergies. Use in each nostril as directed 08/24/23   Thedora Garnette HERO, MD  bumetanide  (BUMEX ) 0.5 MG tablet Take 1 tablet (0.5 mg total) by mouth daily. Patient taking differently: Take 0.5 mg by mouth as needed. 05/31/23   [name], MD  clindamycin  (CLINDAGEL ) 1 % gel Apply 1 application topically daily. Patient not taking: Reported on 07/23/2024 06/10/19   [provider]  cyanocobalamin  (VITAMIN B12) 1000 MCG tablet Take 1 tablet (1,000 mcg total) by mouth daily. 06/13/22   [name], MD  dextromethorphan -guaiFENesin  (MUCINEX  DM) 30-600 MG 12hr tablet Take 1 tablet by mouth 2 (two) times daily. Patient taking differently: Take 1 tablet by mouth 2 (two) times daily as needed for cough. 11/15/21   [name], MD  gabapentin  (NEURONTIN ) 300 MG capsule TAKE 3 CAPSULES BY MOUTH THREE TIMES DAILY 04/21/24   [name], MD  levocetirizine (XYZAL ) 5 MG tablet Take 1 tablet (5 mg total) by mouth every evening. 11/15/23   Elnor Lauraine BRAVO, NP  metoprolol  tartrate (LOPRESSOR ) 50 MG tablet Take 1 tablet by mouth twice daily 06/19/24   [name], MD  montelukast  (SINGULAIR ) 10 MG tablet Take 1 tablet (10 mg total) by  mouth at bedtime. 08/23/22   Rollene Almarie LABOR, MD  omeprazole (PRILOSEC) 40 MG capsule Take 40 mg by mouth daily. Patient not taking: Reported on 07/23/2024 03/08/20   [provider]  AISHA PASTOR LANCETS 33G MISC Use as needed daily 11/30/16   Rollene Almarie LABOR, MD  oxyCODONE  (OXY IR/ROXICODONE ) 5 MG immediate release tablet Take 5-10 mg by mouth every 6 (six) hours as needed for pain.    [provider]  Potassium 99 MG TABS Take 1 tablet by mouth in the morning and at  bedtime.    [provider]  potassium chloride  (KLOR-CON ) 20 MEQ packet Take 20 mEq by mouth daily as needed (take with lasix  only). Patient not taking: Reported on 07/23/2024 11/06/23   Rollene Almarie LABOR, MD  Spacer/Aero-Holding Chambers (PRO COMFORT SPACER ADULT) MISC UAD with inhaler 09/23/21   Geofm Glade PARAS, MD  tadalafil  (CIALIS ) 10 MG tablet Take 1 tablet (10 mg total) by mouth every other day as needed for erectile dysfunction. 11/15/23   Elnor Lauraine BRAVO, NP    Allergies: Vancomycin , Shrimp [shellfish allergy], and Zosyn [piperacillin sod-tazobactam so]    Review of Systems  Musculoskeletal:        Leg swelling    Updated Vital Signs BP (!) 161/92 (BP Location: Right Arm)   Pulse 83   Temp 97.8 F (36.6 C) (Oral)   Resp 16   Ht 5' 9 (1.753 m)   Wt (!) 140.6 kg   SpO2 100%   BMI 45.78 kg/m   Physical Exam Vitals and nursing note reviewed.  Constitutional:      General: He is not in acute distress.    Appearance: Normal appearance.  HENT:     Head: Normocephalic and atraumatic.  Eyes:     Extraocular Movements: Extraocular movements intact.     Conjunctiva/sclera: Conjunctivae normal.     Pupils: Pupils are equal, round, and reactive to light.  Cardiovascular:     Rate and Rhythm: Normal rate and regular rhythm.     Pulses: Normal pulses.  Pulmonary:     Effort: Pulmonary effort is normal. No respiratory distress.  Musculoskeletal:        General: Normal range of motion.     Cervical back: Normal range of motion.     Right lower leg: 1+ Pitting Edema present.     Left lower leg: 2+ Pitting Edema present.     Comments: There is moderate swelling, erythema, and warmth to left lower extremity.  No crepitus or obvious signs of trauma/deformity.  Neurovascular intact.  Feet:     Left foot:     Skin integrity: Ulcer present.     Comments: Small, well healing ulcer visualized to lateral plantar aspect of left foot under the 5th metatarsal head.  Skin:     General: Skin is warm and dry.     Capillary Refill: Capillary refill takes less than 2 seconds.  Neurological:     General: No focal deficit present.     Mental Status: He is alert. Mental status is at baseline.  Psychiatric:        Mood and Affect: Mood normal.     (all labs ordered are listed, but only abnormal results are displayed) Labs Reviewed  CBC WITH DIFFERENTIAL/PLATELET - Abnormal; Notable for the following components:      Result Value   RBC 3.75 (*)    Hemoglobin 11.0 (*)    HCT 34.0 (*)    All other components  within normal limits  COMPREHENSIVE METABOLIC PANEL WITH GFR - Abnormal; Notable for the following components:   Potassium 3.3 (*)    Glucose, Bld 104 (*)    Calcium  8.5 (*)    All other components within normal limits   EKG: None  Radiology: CT FOOT LEFT W CONTRAST Result Date: 07/23/2024 EXAM: CT OF THE LEFT FOOT, WITH IV CONTRAST 07/23/2024 04:45:50 AM TECHNIQUE: Axial images were acquired through the left foot with IV contrast. Reformatted images were reviewed. Automated exposure control, iterative reconstruction, and/or weight based adjustment of the mA/kV was utilized to reduce the radiation dose to as low as reasonably achievable. 100 mL of iohexol  (OMNIPAQUE ) 300 MG/ML solution was administered intravenously. COMPARISON: Left foot 3 view x ray study 07/21/2024. CLINICAL HISTORY: Osteomyelitis suspected, foot, xray done. Left lower extremity doppler venous study was performed 2 days ago with no evidence of lower extremity DVT. FINDINGS: BONES: There is osteopenia without evidence of fractures or destructive bone lesions. Moderate hallux valgus with otherwise normal interosseous alignment. No midfoot sagging is seen. There is a small plantar calcaneal spur, which appears noninflammatory. An os trigonum is seen posterior to the subtalar joint. As above no underlying findings of acute osteomyelitis are seen. JOINTS: No dislocation. There is joint space narrowing  and spurring involving the talonavicular joint, the naviculocuneiform joints, and the 2nd through 4th tarsometatarsal joints in keeping with midfoot arthrosis. There is mild first MTP joint space loss. There is no erosive arthropathy throughout. There is slight spurring at the ankle joint margins. SOFT TISSUES: There is diffuse moderate to severe subcutaneous edema in the ankle and foot. In the superolateral aspect of the foot there is a rim enhancing subcutaneous ovoid fluid collection measuring 23.5 Hounsfield units and containing a few air locules, overlying the distal 5th metatarsal and proximal phalanx of the 5th toe and measuring 3.1 x 1.7 x 2.0 cm. Findings consistent with an abscess. This abuts and compresses the 5th extensor tendon. There are no other localizing collections. The intrinsic tendons are intact. There is partial atrophy in the plantar intrinsic foot musculature, which is fairly common with long-standing diabetes. No foot ulcers are seen. IMPRESSION: 1. Findings consistent with a subcutaneous abscess in the superolateral aspect of the forefoot, abutting and depressing the 5th extensor tendon, without CT evidence of acute osteomyelitis. 2. Diffuse moderate to severe subcutaneous edema in the ankle and foot. 3. Osteopenia and degenerative change. Electronically signed by: Francis Quam MD 07/23/2024 05:40 AM EST RP Workstation: [id number]     Procedures   Medications Ordered in the ED  potassium chloride  SA (KLOR-CON  M) CR tablet 20 mEq (20 mEq Oral Given 07/21/24 1753)                                 Medical Decision Making Amount and/or Complexity of Data Reviewed Radiology: ordered.   Patient presents to the ED for: Left lower extremity edema This involves an extensive number of treatment options and is a complaint that carries with it a high risk of complications  Differential diagnosis includes: Infectious etiology DVT Co-morbid conditions: Hypertension, diabetes, CHF,  obesity, recurrent cellulitis  Additional history/records obtained and reviewed: Additional history obtained from  outside medical records External records from outside source obtained and reviewed including several visits regarding patient's ongoing cellulitis infections.  Clinical Course as of 07/25/24 0243  Mon Jul 21, 2024  1554 Temp: 99.9 F (37.7 C) Afebrile, vital  stable, no acute distress. [ML]  1640 DG Foot Complete Left Diffuse soft tissue swelling [ML]  1707 CBC with Differential(!) No acute findings [ML]  1742 Comprehensive metabolic panel(!) Mild hypokalemia per patient's baseline [ML]  1824 Patient treated with cefepime  for cellulitis, and potassium chloride  tablet for hypokalemia-well tolerated [ML]  1824 US  Venous Img Lower  Left (DVT Study) No findings of DVT [ML]    Clinical Course User Index [ML] Willma Duwaine CROME, PA    Data Reviewed / Actions Taken: Labs ordered/reviewed with my independent interpretation in ED course above. Imaging ordered/reviewed with my independent interpretation in ED course above. I agree with the radiologists interpretation.   Management / Treatments: See ED course above for medications, treatments administered, and clinical rationale.   I have reviewed the patients home medicines and have made adjustments as needed  ED Course / Reassessments: Problem List: Cellulitis 43 year old male presented for left lower extremity edema. Initial assessment included history, physical exam, and review of prior medical records. Laboratory studies and imaging were obtained and overall reassuring without any findings of DVT or bacteremia.  Patient's lower extremity edema likely non-acute cellulitis in nature. Management included IV antibiotics, with ongoing reassessment. Vital signs were obtained and monitored, and the patient remained stable throughout the stay.  Given patient's history of ongoing cellulitis infections, reassuring workup, stable vitals, and  patient shared medical decision making patient is cleared for outpatient management with oral antibiotic treatment and close follow-up with PCP for further evaluation and care.  Patient given strict return precautions should new or worsening symptoms arise.  Serial reassessments performed: Yes    Disposition: Disposition: Discharge with close follow-up with PCP for further evaluation and care of cellulitis infection within 1 week. Rationale for disposition: Stable for discharge. The disposition plan and rationale were discussed with the patient at the bedside, all questions were addressed, and the patient demonstrated understanding.  This note was produced using [employer] Engineer. While I have reviewed and verified all clinical information, transcription errors may remain.      Final diagnoses:  Cellulitis of left lower extremity    ED Discharge Orders          Ordered    Ambulatory referral to Podiatry        07/21/24 1745    sulfamethoxazole -trimethoprim  (BACTRIM  DS) 800-160 MG tablet  2 times daily        07/21/24 1836    cephALEXin  (KEFLEX ) 500 MG capsule  3 times daily        07/21/24 1836               Willma Duwaine CROME, [place] 07/25/24 0244    Patt Alm Macho, MD 07/30/24 1427  "

## 2024-07-21 NOTE — ED Triage Notes (Addendum)
 Pt arrives with complaints of left foot edema. States that some skin was peeling off side of his foot a while ago and noted some blood. Place antibiotic cream to foot and swelling on Thursday evening. Pt states he also noted some pink-tinged drainage on the pillow after propping foot up.

## 2024-07-21 NOTE — Telephone Encounter (Signed)
 FYI Only or Action Required?: FYI only for provider: UC advised .  Patient was last seen in primary care on 05/30/2024 by Alvia Corean CROME, FNP.  Called Nurse Triage reporting Laceration (Laceration on foot).  Symptoms began several days ago.  Interventions attempted: Rest, hydration, or home remedies.  Symptoms are: gradually worsening.  Triage Disposition: See HCP Within 4 Hours (Or PCP Triage)  Patient/caregiver understands and will follow disposition?: Yes  Copied from CRM #[id number]. Topic: Clinical - Red Word Triage >> Jul 21, 2024 11:29 AM Carlyon D wrote: Red Word that prompted transfer to Nurse Triage: L Cut under the foot, caused foot to swell painful redness and warmth Reason for Disposition  [1] Looks infected (e.g., spreading redness, pus) AND [2] large red area (> 2 inches or 5 cm) or streak  Answer Assessment - Initial Assessment Questions 1. APPEARANCE of INJURY: What does the injury look like?      Discharge- pink, foot is swollen, small open circular area 2. ONSET: How long ago did the injury occur?      Last Wednesday- noticed swelling 3. LOCATION: Where is the injury located?      Cut on bottom of foot- left - ball of foot- toward fifth toe 4. SIZE: How large is the cut?      Looks like circular space- small 5. BLEEDING: Is it bleeding now? If Yes, ask: Is it difficult to stop?      Some pink discharge  6. PAIN: Is there any pain? If Yes, ask: How bad is the pain? (Scale 0-10; or none, mild, moderate, severe)     5-6/10- not walking, walking is worse 7. MECHANISM: Tell me how it happened.      Not sure how cut foot- possible wearing shoes too tight 8. TETANUS: When was your last tetanus booster?     Not sure  Protocols used: Cuts and Lacerations-A-AH

## 2024-07-23 ENCOUNTER — Inpatient Hospital Stay (HOSPITAL_COMMUNITY)

## 2024-07-23 ENCOUNTER — Encounter (HOSPITAL_BASED_OUTPATIENT_CLINIC_OR_DEPARTMENT_OTHER): Payer: Self-pay

## 2024-07-23 ENCOUNTER — Inpatient Hospital Stay (HOSPITAL_BASED_OUTPATIENT_CLINIC_OR_DEPARTMENT_OTHER)
Admission: EM | Admit: 2024-07-23 | Discharge: 2024-07-29 | DRG: 617 | Disposition: A | Discharge status: Home Health | Attending: Internal Medicine | Admitting: Internal Medicine

## 2024-07-23 ENCOUNTER — Other Ambulatory Visit: Payer: Self-pay

## 2024-07-23 ENCOUNTER — Emergency Department (HOSPITAL_BASED_OUTPATIENT_CLINIC_OR_DEPARTMENT_OTHER)

## 2024-07-23 DIAGNOSIS — E11621 Type 2 diabetes mellitus with foot ulcer: Secondary | ICD-10-CM | POA: Diagnosis present

## 2024-07-23 DIAGNOSIS — M79672 Pain in left foot: Secondary | ICD-10-CM | POA: Diagnosis present

## 2024-07-23 DIAGNOSIS — F32A Depression, unspecified: Secondary | ICD-10-CM | POA: Diagnosis present

## 2024-07-23 DIAGNOSIS — Z91013 Allergy to seafood: Secondary | ICD-10-CM

## 2024-07-23 DIAGNOSIS — L03116 Cellulitis of left lower limb: Secondary | ICD-10-CM | POA: Diagnosis present

## 2024-07-23 DIAGNOSIS — E876 Hypokalemia: Secondary | ICD-10-CM | POA: Diagnosis present

## 2024-07-23 DIAGNOSIS — Z833 Family history of diabetes mellitus: Secondary | ICD-10-CM

## 2024-07-23 DIAGNOSIS — E11628 Type 2 diabetes mellitus with other skin complications: Secondary | ICD-10-CM | POA: Diagnosis not present

## 2024-07-23 DIAGNOSIS — L0291 Cutaneous abscess, unspecified: Secondary | ICD-10-CM

## 2024-07-23 DIAGNOSIS — L02612 Cutaneous abscess of left foot: Secondary | ICD-10-CM | POA: Diagnosis present

## 2024-07-23 DIAGNOSIS — Z6841 Body Mass Index (BMI) 40.0 and over, adult: Secondary | ICD-10-CM

## 2024-07-23 DIAGNOSIS — M109 Gout, unspecified: Secondary | ICD-10-CM | POA: Diagnosis present

## 2024-07-23 DIAGNOSIS — E1169 Type 2 diabetes mellitus with other specified complication: Secondary | ICD-10-CM | POA: Diagnosis present

## 2024-07-23 DIAGNOSIS — Z881 Allergy status to other antibiotic agents status: Secondary | ICD-10-CM

## 2024-07-23 DIAGNOSIS — L0390 Cellulitis, unspecified: Principal | ICD-10-CM

## 2024-07-23 DIAGNOSIS — N529 Male erectile dysfunction, unspecified: Secondary | ICD-10-CM | POA: Diagnosis present

## 2024-07-23 DIAGNOSIS — Z89422 Acquired absence of other left toe(s): Secondary | ICD-10-CM | POA: Diagnosis not present

## 2024-07-23 DIAGNOSIS — E662 Morbid (severe) obesity with alveolar hypoventilation: Secondary | ICD-10-CM | POA: Diagnosis present

## 2024-07-23 DIAGNOSIS — G629 Polyneuropathy, unspecified: Secondary | ICD-10-CM | POA: Diagnosis not present

## 2024-07-23 DIAGNOSIS — E1140 Type 2 diabetes mellitus with diabetic neuropathy, unspecified: Secondary | ICD-10-CM | POA: Diagnosis present

## 2024-07-23 DIAGNOSIS — I5022 Chronic systolic (congestive) heart failure: Secondary | ICD-10-CM | POA: Diagnosis not present

## 2024-07-23 DIAGNOSIS — M86172 Other acute osteomyelitis, left ankle and foot: Secondary | ICD-10-CM | POA: Diagnosis present

## 2024-07-23 DIAGNOSIS — I110 Hypertensive heart disease with heart failure: Secondary | ICD-10-CM | POA: Diagnosis present

## 2024-07-23 DIAGNOSIS — Z8249 Family history of ischemic heart disease and other diseases of the circulatory system: Secondary | ICD-10-CM

## 2024-07-23 DIAGNOSIS — Z794 Long term (current) use of insulin: Secondary | ICD-10-CM | POA: Diagnosis not present

## 2024-07-23 DIAGNOSIS — L97429 Non-pressure chronic ulcer of left heel and midfoot with unspecified severity: Secondary | ICD-10-CM | POA: Diagnosis present

## 2024-07-23 DIAGNOSIS — K219 Gastro-esophageal reflux disease without esophagitis: Secondary | ICD-10-CM | POA: Diagnosis present

## 2024-07-23 DIAGNOSIS — Z79899 Other long term (current) drug therapy: Secondary | ICD-10-CM

## 2024-07-23 DIAGNOSIS — M869 Osteomyelitis, unspecified: Secondary | ICD-10-CM | POA: Diagnosis not present

## 2024-07-23 DIAGNOSIS — Z930 Tracheostomy status: Secondary | ICD-10-CM

## 2024-07-23 DIAGNOSIS — Z91199 Patient's noncompliance with other medical treatment and regimen due to unspecified reason: Secondary | ICD-10-CM | POA: Diagnosis not present

## 2024-07-23 DIAGNOSIS — I10 Essential (primary) hypertension: Secondary | ICD-10-CM | POA: Diagnosis not present

## 2024-07-23 DIAGNOSIS — I5032 Chronic diastolic (congestive) heart failure: Secondary | ICD-10-CM | POA: Diagnosis present

## 2024-07-23 DIAGNOSIS — L089 Local infection of the skin and subcutaneous tissue, unspecified: Secondary | ICD-10-CM

## 2024-07-23 LAB — CBC
HCT: 35.3 % — ABNORMAL LOW (ref 39.0–52.0)
HCT: 36.4 % — ABNORMAL LOW (ref 39.0–52.0)
Hemoglobin: 11.3 g/dL — ABNORMAL LOW (ref 13.0–17.0)
Hemoglobin: 11.5 g/dL — ABNORMAL LOW (ref 13.0–17.0)
MCH: 28.9 pg (ref 26.0–34.0)
MCH: 29 pg (ref 26.0–34.0)
MCHC: 32 g/dL (ref 30.0–36.0)
MCHC: 31.6 g/dL (ref 30.0–36.0)
MCV: 90.3 fL (ref 80.0–100.0)
MCV: 91.7 fL (ref 80.0–100.0)
Platelets: 207 K/uL (ref 150–400)
Platelets: 199 K/uL (ref 150–400)
RBC: 3.91 MIL/uL — ABNORMAL LOW (ref 4.22–5.81)
RBC: 3.97 MIL/uL — ABNORMAL LOW (ref 4.22–5.81)
RDW: 13.9 % (ref 11.5–15.5)
RDW: 13.7 % (ref 11.5–15.5)
WBC: 7.7 K/uL (ref 4.0–10.5)
WBC: 5.2 K/uL (ref 4.0–10.5)
nRBC: 0 % (ref 0.0–0.2)
nRBC: 0 % (ref 0.0–0.2)

## 2024-07-23 LAB — BASIC METABOLIC PANEL WITH GFR
Anion gap: 9 (ref 5–15)
BUN: 10 mg/dL (ref 6–20)
CO2: 29 mmol/L (ref 22–32)
Calcium: 8.8 mg/dL — ABNORMAL LOW (ref 8.9–10.3)
Chloride: 102 mmol/L (ref 98–111)
Creatinine, Ser: 1.23 mg/dL (ref 0.61–1.24)
GFR, Estimated: >60 mL/min
Glucose, Bld: 97 mg/dL (ref 70–99)
Potassium: 3.5 mmol/L (ref 3.5–5.1)
Sodium: 141 mmol/L (ref 135–145)

## 2024-07-23 LAB — CREATININE, SERUM
Creatinine, Ser: 1.24 mg/dL (ref 0.61–1.24)
GFR, Estimated: >60 mL/min

## 2024-07-23 LAB — HIV ANTIBODY (ROUTINE TESTING W REFLEX): HIV Screen 4th Generation wRfx: NONREACTIVE

## 2024-07-23 MED ORDER — LIDOCAINE HCL (PF) 1 % IJ SOLN
10.0000 mL | Freq: Once | INTRAMUSCULAR | Status: AC
  Administered 2024-07-23: 10 mL
  Filled 2024-07-23: qty 10

## 2024-07-23 MED ORDER — CETIRIZINE HCL 10 MG PO TABS
5.0000 mg | ORAL_TABLET | Freq: Every evening | ORAL | Status: DC
  Administered 2024-07-23 – 2024-07-28 (×6): 5 mg via ORAL
  Filled 2024-07-23 (×7): qty 1

## 2024-07-23 MED ORDER — LINEZOLID 600 MG/300ML IV SOLN
600.0000 mg | Freq: Two times a day (BID) | INTRAVENOUS | Status: DC
  Administered 2024-07-23 (×2): 600 mg via INTRAVENOUS
  Filled 2024-07-23 (×3): qty 300

## 2024-07-23 MED ORDER — GABAPENTIN 300 MG PO CAPS
900.0000 mg | ORAL_CAPSULE | Freq: Three times a day (TID) | ORAL | Status: DC
  Administered 2024-07-23 – 2024-07-29 (×17): 900 mg via ORAL
  Filled 2024-07-23 (×14): qty 3

## 2024-07-23 MED ORDER — GADOBUTROL 1 MMOL/ML IV SOLN
10.0000 mL | Freq: Once | INTRAVENOUS | Status: AC | PRN
  Administered 2024-07-23: 10 mL via INTRAVENOUS

## 2024-07-23 MED ORDER — ACETAMINOPHEN 325 MG PO TABS: 650.0000 mg | ORAL_TABLET | Freq: Four times a day (QID) | ORAL | Status: DC | PRN

## 2024-07-23 MED ORDER — METOPROLOL TARTRATE 50 MG PO TABS
50.0000 mg | ORAL_TABLET | Freq: Two times a day (BID) | ORAL | Status: DC
  Administered 2024-07-23 – 2024-07-29 (×12): 50 mg via ORAL
  Filled 2024-07-23 (×10): qty 1

## 2024-07-23 MED ORDER — POLYETHYLENE GLYCOL 3350 17 G PO PACK: 17.0000 g | PACK | Freq: Every day | ORAL | Status: DC | PRN

## 2024-07-23 MED ORDER — VITAMIN B-12 1000 MCG PO TABS
1000.0000 ug | ORAL_TABLET | Freq: Every day | ORAL | Status: DC
  Administered 2024-07-23 – 2024-07-29 (×7): 1000 ug via ORAL
  Filled 2024-07-23 (×5): qty 1

## 2024-07-23 MED ORDER — ALBUTEROL SULFATE (2.5 MG/3ML) 0.083% IN NEBU: 2.5000 mg | INHALATION_SOLUTION | RESPIRATORY_TRACT | Status: DC | PRN

## 2024-07-23 MED ORDER — MORPHINE SULFATE (PF) 2 MG/ML IV SOLN
1.0000 mg | INTRAVENOUS | Status: DC | PRN
  Administered 2024-07-26 – 2024-07-27 (×2): 1 mg via INTRAVENOUS
  Filled 2024-07-23 (×2): qty 1

## 2024-07-23 MED ORDER — IOHEXOL 300 MG/ML  SOLN
100.0000 mL | Freq: Once | INTRAMUSCULAR | Status: AC | PRN
  Administered 2024-07-23: 100 mL via INTRAVENOUS

## 2024-07-23 MED ORDER — SODIUM CHLORIDE 0.9 % IV SOLN
2.0000 g | Freq: Once | INTRAVENOUS | Status: AC
  Administered 2024-07-23: 2 g via INTRAVENOUS
  Filled 2024-07-23: qty 20

## 2024-07-23 MED ORDER — OXYCODONE HCL 5 MG PO TABS
5.0000 mg | ORAL_TABLET | Freq: Once | ORAL | Status: AC
  Administered 2024-07-23: 5 mg via ORAL
  Filled 2024-07-23: qty 1

## 2024-07-23 MED ORDER — ONDANSETRON HCL 4 MG PO TABS: 4.0000 mg | ORAL_TABLET | Freq: Four times a day (QID) | ORAL | Status: DC | PRN

## 2024-07-23 MED ORDER — ACETAMINOPHEN 650 MG RE SUPP: 650.0000 mg | Freq: Four times a day (QID) | RECTAL | Status: DC | PRN

## 2024-07-23 MED ORDER — ENOXAPARIN SODIUM 80 MG/0.8ML IJ SOSY
0.5000 mg/kg | PREFILLED_SYRINGE | INTRAMUSCULAR | Status: DC
  Administered 2024-07-23 – 2024-07-28 (×6): 70 mg via SUBCUTANEOUS
  Filled 2024-07-23 (×5): qty 0.8

## 2024-07-23 MED ORDER — BUMETANIDE 0.5 MG PO TABS
0.5000 mg | ORAL_TABLET | Freq: Every day | ORAL | Status: DC
  Administered 2024-07-23 – 2024-07-29 (×7): 0.5 mg via ORAL
  Filled 2024-07-23 (×5): qty 1

## 2024-07-23 MED ORDER — ONDANSETRON HCL 4 MG/2ML IJ SOLN
4.0000 mg | Freq: Four times a day (QID) | INTRAMUSCULAR | Status: DC | PRN
  Administered 2024-07-28: 4 mg via INTRAVENOUS

## 2024-07-23 MED ORDER — OXYCODONE HCL 5 MG PO TABS
5.0000 mg | ORAL_TABLET | ORAL | Status: DC | PRN
  Administered 2024-07-23 – 2024-07-29 (×10): 5 mg via ORAL
  Filled 2024-07-23 (×9): qty 1

## 2024-07-23 MED ORDER — PANTOPRAZOLE SODIUM 40 MG PO TBEC
40.0000 mg | DELAYED_RELEASE_TABLET | Freq: Every day | ORAL | Status: DC
  Administered 2024-07-23 – 2024-07-29 (×7): 40 mg via ORAL
  Filled 2024-07-23 (×5): qty 1

## 2024-07-23 NOTE — Consult Note (Addendum)
 Reason for Consult:Left foot infection Referring Physician: Mennie Lamy Time called: 1445 Time at bedside: 1517   Derrick [name] is an 43 y.o. male.  HPI: Tredarius developed some left foot pain 1 week ago that he attributed to a new boot. He had some swelling on Friday. By the early part of week he began to have some discharge. He was seen in the ED and discharged on oral abx but returned with worsening s/sx and was admitted. He also underwent I&D. Orthopedic surgery was consulted on arrival at [facility].He denies prior hx/o similar and doesn't think he wore a blister or otherwise had broken skin at the area.  Past Medical History:  Diagnosis Date   Acute on chronic diastolic CHF (congestive heart failure), NYHA class 1 (HCC)    Cellulitis    Diabetes mellitus without complication (HCC)    GERD (gastroesophageal reflux disease)    Hypertension    Hypoxemia    Morbid obesity (HCC)    Obesity hypoventilation syndrome (HCC)    OSA (obstructive sleep apnea)    Reflux     Past Surgical History:  Procedure Laterality Date   TRACHEOSTOMY TUBE PLACEMENT N/A 06/21/2015   Procedure: TRACHEOSTOMY;  Surgeon: Daniel Moccasin, MD;  Location: [facility] OR;  Service: ENT;  Laterality: N/A;    Family History  Problem Relation Age of Onset   Diabetes Maternal Grandmother    Hypertension Maternal Grandmother    Asthma Other    Cancer Other    Stroke Other    Heart disease Other     Social History:  reports that he has never smoked. He has never used smokeless tobacco. He reports that he does not drink alcohol and does not use drugs.  Allergies: Allergies[1]  Medications: I have reviewed the patient's current medications.  Results for orders placed or performed during the hospital encounter of 07/23/24 (from the past 48 hours)  CBC     Status: Abnormal   Collection Time: 07/23/24  3:32 AM  Result Value Ref Range   WBC 7.7 4.0 - 10.5 K/uL   RBC 3.91 (L) 4.22 - 5.81 MIL/uL   Hemoglobin 11.3 (L) 13.0 -  17.0 g/dL   HCT 64.6 (L) 60.9 - 47.9 %   MCV 90.3 80.0 - 100.0 fL   MCH 28.9 26.0 - 34.0 pg   MCHC 32.0 30.0 - 36.0 g/dL   RDW 86.0 88.4 - 84.4 %   Platelets 207 150 - 400 K/uL   nRBC 0.0 0.0 - 0.2 %    Comment: Performed at [facility], [address] [place], [place] [address]  Basic metabolic panel with GFR     Status: Abnormal   Collection Time: 07/23/24  3:32 AM  Result Value Ref Range   Sodium 141 135 - 145 mmol/L   Potassium 3.5 3.5 - 5.1 mmol/L   Chloride 102 98 - 111 mmol/L   CO2 29 22 - 32 mmol/L   Glucose, Bld 97 70 - 99 mg/dL    Comment: Glucose reference range applies only to samples taken after fasting for at least 8 hours.   BUN 10 6 - 20 mg/dL   Creatinine, Ser 8.76 0.61 - 1.24 mg/dL   Calcium  8.8 (L) 8.9 - 10.3 mg/dL   GFR, Estimated >39 >39 mL/min    Comment: (NOTE) Calculated using the CKD-EPI Creatinine Equation (2021)    Anion gap 9 5 - 15    Comment: Performed at [facility], [address] [place], [place] [address]    CT FOOT LEFT W CONTRAST Result Date: 07/23/2024 EXAM: CT OF THE LEFT FOOT, WITH IV CONTRAST 07/23/2024 04:45:50 AM TECHNIQUE: Axial images were acquired through the left foot with IV contrast. Reformatted images were reviewed. Automated exposure control, iterative reconstruction, and/or weight based adjustment of the mA/kV was utilized to reduce the radiation dose to as low as reasonably achievable. 100 mL of iohexol  (OMNIPAQUE ) 300 MG/ML solution was administered intravenously. COMPARISON: Left foot 3 view x ray study 07/21/2024. CLINICAL HISTORY: Osteomyelitis suspected, foot, xray done. Left lower extremity doppler venous study was performed 2 days ago with no evidence of lower extremity DVT. FINDINGS: BONES: There is osteopenia without evidence of fractures or destructive bone lesions. Moderate hallux valgus with otherwise normal interosseous alignment. No midfoot sagging is seen. There is a small plantar calcaneal  spur, which appears noninflammatory. An os trigonum is seen posterior to the subtalar joint. As above no underlying findings of acute osteomyelitis are seen. JOINTS: No dislocation. There is joint space narrowing and spurring involving the talonavicular joint, the naviculocuneiform joints, and the 2nd through 4th tarsometatarsal joints in keeping with midfoot arthrosis. There is mild first MTP joint space loss. There is no erosive arthropathy throughout. There is slight spurring at the ankle joint margins. SOFT TISSUES: There is diffuse moderate to severe subcutaneous edema in the ankle and foot. In the superolateral aspect of the foot there is a rim enhancing subcutaneous ovoid fluid collection measuring 23.5 Hounsfield units and containing a few air locules, overlying the distal 5th metatarsal and proximal phalanx of the 5th toe and measuring 3.1 x 1.7 x 2.0 cm. Findings consistent with an abscess. This abuts and compresses the 5th extensor tendon. There are no other localizing collections. The intrinsic tendons are intact. There is partial atrophy in the plantar intrinsic foot musculature, which is fairly common with long-standing diabetes. No foot ulcers are seen. IMPRESSION: 1. Findings consistent with a subcutaneous abscess in the superolateral aspect of the forefoot, abutting and depressing the 5th extensor tendon, without CT evidence of acute osteomyelitis. 2. Diffuse moderate to severe subcutaneous edema in the ankle and foot. 3. Osteopenia and degenerative change. Electronically signed by: Francis Quam MD 07/23/2024 05:40 AM EST RP Workstation: [id number]   US  Venous Img Lower  Left (DVT Study) Result Date: 07/21/2024 CLINICAL DATA:  Left lower extremity edema. EXAM: LEFT LOWER EXTREMITY VENOUS DOPPLER ULTRASOUND TECHNIQUE: Gray-scale sonography with compression, as well as color and duplex ultrasound, were performed to evaluate the deep venous system(s) from the level of the common femoral vein  through the popliteal and proximal calf veins. COMPARISON:  Left lower extremity ultrasound 03/06/2016 FINDINGS: VENOUS Normal compressibility of the common femoral, superficial femoral, and popliteal veins, as well as the visualized calf veins. Visualized portions of profunda femoral vein and great saphenous vein unremarkable. No filling defects to suggest DVT on grayscale or color Doppler imaging. Doppler waveforms show normal direction of venous flow, normal respiratory plasticity and response to augmentation. Limited views of the contralateral common femoral vein are unremarkable. OTHER Subcutaneous edema in the lower leg. Limitations: none IMPRESSION: No evidence of left lower extremity DVT. Electronically Signed   By: Andrea Gasman M.D.   On: 07/21/2024 18:02   DG Foot Complete Left Result Date: 07/21/2024 CLINICAL DATA:  Foot edema EXAM: LEFT FOOT - COMPLETE 3+ VIEW COMPARISON:  03/15/2016 FINDINGS: Bones appear demineralized. No fracture or malalignment. Limited by flexion at the MTP joints. Diffuse soft tissue  swelling with more focal swelling and possible ulceration lateral aspect of the foot at the level of the fifth MTP joint. No radiopaque foreign body. No definite erosion or osseous destructive change. IMPRESSION: Diffuse soft tissue swelling with more focal swelling and possible ulceration lateral aspect of the foot at the level of the fifth MTP joint. No definite radiographic evidence for acute osseous abnormality Electronically Signed   By: Luke Bun M.D.   On: 07/21/2024 16:35    Review of Systems  HENT:  Negative for ear discharge, ear pain, hearing loss and tinnitus.   Eyes:  Negative for photophobia and pain.  Respiratory:  Negative for cough and shortness of breath.   Cardiovascular:  Negative for chest pain.  Gastrointestinal:  Negative for abdominal pain, nausea and vomiting.  Genitourinary:  Negative for dysuria, flank pain, frequency and urgency.  Musculoskeletal:   Positive for arthralgias (Left foot). Negative for back pain, myalgias and neck pain.  Neurological:  Negative for dizziness and headaches.  Hematological:  Does not bruise/bleed easily.  Psychiatric/Behavioral:  The patient is not nervous/anxious.    Blood pressure 131/87, pulse 81, temperature 98 F (36.7 C), temperature source Oral, resp. rate 18, height 5' 9 (1.753 m), weight (!) 140.6 kg, SpO2 98%. Physical Exam Constitutional:      General: He is not in acute distress.    Appearance: He is well-developed. He is not diaphoretic.  HENT:     Head: Normocephalic and atraumatic.  Eyes:     General: No scleral icterus.       Right eye: No discharge.        Left eye: No discharge.     Conjunctiva/sclera: Conjunctivae normal.  Cardiovascular:     Rate and Rhythm: Normal rate and regular rhythm.  Pulmonary:     Effort: Pulmonary effort is normal. No respiratory distress.  Musculoskeletal:     Cervical back: Normal range of motion.  Feet:     Comments: LLE Mild erythema distal forefoot with fluctuant mass lateral dorsal foot centered on 5th MTP joint  Mod TTP  No ankle effusion  Sens DPN, SPN, TN intact  Motor EHL, ext, flex, evers 5/5  DP 1+, PT 1+, 2+ NP edema Skin:    General: Skin is warm and dry.  Neurological:     Mental Status: He is alert.  Psychiatric:        Mood and Affect: Mood normal.        Behavior: Behavior normal.     Assessment/Plan: Left foot infection -- Will get MRI to assess bony involvement and any residual abscess after I&D.    Ozell DOROTHA Ned, PA-C Orthopedic Surgery [phone] 07/23/2024, 3:38 PM   Discussed imaging, findings and overall clinical picture at length with the patient/family Plan is for left 5th ray amputation with I&D on 07/26/24 Due to MRI findings of osteomyelitis and plantar ulceration as well as rapid (1 wk) onset of severe infection, will plan on amputation for source control Patient is in agreement and ready to  proceed Return to Hospitalist service postop Anticipate 6 wk abx due to extensive infection  The risks and benefits were presented and reviewed. The risks due to hardware failure/irritation, new/persistent/recurrent infection, stiffness, nerve/vessel/tendon injury, nonunion/malunion of any fracture, wound healing issues, allograft usage, development of arthritis, failure of this surgery, possibility of external fixation in certain situations, possibility of delayed definitive surgery, need for further surgery, prolonged wound care including further soft tissue coverage procedures, thromboembolic events, anesthesia/medical complications/events perioperatively  and beyond, amputation, death among others were discussed. The patient acknowledged the explanation, agreed to proceed with the plan and a consent was signed.     [1]  Allergies Allergen Reactions   Vancomycin  Other (See Comments)    Affected kidneys    Shrimp [Shellfish Allergy]    Zosyn [Piperacillin Sod-Tazobactam So] Other (See Comments)

## 2024-07-23 NOTE — Care Plan (Signed)
 43 y.o. male with medical history significant of OSA-s/p tracheotomy, HTN, chronic HFpEF, gout admitted for left foot cellulitis and subcutaneous abscess in the superolateral aspect on forefoot, abutting and depressing the 5th extensor tendon, without CT evidence of acute osteomyelitis, s/p bedside drainage in ED.   Am labs already ordered On diet On Zyvox . Pain control w/ po oxy and iv morphine  prn. Home metoprolol , ppi, b12 bumex , gabapentin  and nebs in order  Patient admitted from [facility] ED and arrived in 6N Patient seen and examined AND orders reviewed  He has hx of prediabetes and but states  doing well since I lost weight. He is intact DP and PT pulses on left leg but area looks bit more swollen. Appears non toxic. See H and P for more details.

## 2024-07-23 NOTE — ED Provider Notes (Signed)
 " [facility]-EMERGENCY DEPT [facility] Emergency Department Provider Note MRN:  [id number]  Arrival date & time: 07/23/2024     Chief Complaint   Wound Check   History of Present Illness   Derrick [name] is a 43 y.o. year-old male with a history of hypertension presenting to the ED with chief complaint of wound check.  Had some left foot swelling last week and was planned antibiotics.  Here because it is worse, here for a recheck.  Denies fever.  Blisters forming on the foot.  Review of Systems  A thorough review of systems was obtained and all systems are negative except as noted in the HPI and PMH.   Patient's Health History    Past Medical History:  Diagnosis Date   Acute on chronic diastolic CHF (congestive heart failure), NYHA class 1 (HCC)    Cellulitis    Diabetes mellitus without complication (HCC)    GERD (gastroesophageal reflux disease)    Hypertension    Hypoxemia    Morbid obesity (HCC)    Obesity hypoventilation syndrome (HCC)    OSA (obstructive sleep apnea)    Reflux     Past Surgical History:  Procedure Laterality Date   TRACHEOSTOMY TUBE PLACEMENT N/A 06/21/2015   Procedure: TRACHEOSTOMY;  Surgeon: Daniel Moccasin, MD;  Location: [facility] OR;  Service: ENT;  Laterality: N/A;    Family History  Problem Relation Age of Onset   Diabetes Maternal Grandmother    Hypertension Maternal Grandmother    Asthma Other    Cancer Other    Stroke Other    Heart disease Other     Social History   Socioeconomic History   Marital status: Single    Spouse name: Not on file   Number of children: Not on file   Years of education: Not on file   Highest education level: Not on file  Occupational History   Not on file  Tobacco Use   Smoking status: Never   Smokeless tobacco: Never  Substance and Sexual Activity   Alcohol use: No   Drug use: No   Sexual activity: Not on file  Other Topics Concern   Not on file  Social History Narrative   Not on file   Social  Drivers of Health   Tobacco Use: Low Risk (07/23/2024)   Patient History    Smoking Tobacco Use: Never    Smokeless Tobacco Use: Never    Passive Exposure: Not on file  Financial Resource Strain: Low Risk (08/02/2023)   Received from [facility]   Overall Financial Resource Strain (CARDIA)    Difficulty of Paying Living Expenses: Not hard at all  Food Insecurity: No Food Insecurity (10/23/2023)   Received from [facility]   Epic    Within the past 12 months, you worried that your food would run out before you got the money to buy more.: Never true    Within the past 12 months, the food you bought just didn't last and you didn't have money to get more.: Never true  Transportation Needs: No Transportation Needs (10/23/2023)   Received from [facility] - Transportation    Lack of Transportation (Medical): No    Lack of Transportation (Non-Medical): No  Physical Activity: Insufficiently Active (08/02/2023)   Received from [facility]   Exercise Vital Sign    On average, how many days per week do you engage in moderate to strenuous exercise (like a brisk walk)?: 3 days  On average, how many minutes do you engage in exercise at this level?: 20 min  Stress: No Stress Concern Present (10/23/2023)   Received from [facility] of Occupational Health - Occupational Stress Questionnaire    Feeling of Stress : Only a little  Recent Concern: Stress - Stress Concern Present (08/02/2023)   Received from [facility] of Occupational Health - Occupational Stress Questionnaire    Feeling of Stress : To some extent  Social Connections: Socially Integrated (08/02/2023)   Received from [facility]   Social Network    How would you rate your social network (family, work, friends)?: Good participation with social networks  Intimate Partner Violence: Not At Risk (10/23/2023)   Received from [facility]   HITS    Over the last 12 months how often  did your partner physically hurt you?: Never    Over the last 12 months how often did your partner insult you or talk down to you?: Never    Over the last 12 months how often did your partner threaten you with physical harm?: Never    Over the last 12 months how often did your partner scream or curse at you?: Never  Depression (PHQ2-9): Low Risk (05/30/2024)   Depression (PHQ2-9)    PHQ-2 Score: 3  Alcohol Screen: Not on file  Housing: Low Risk (10/23/2023)   Received from [facility]    In the last 12 months, was there a time when you were not able to pay the mortgage or rent on time?: No    In the past 12 months, how many times have you moved where you were living?: 0    At any time in the past 12 months, were you homeless or living in a shelter (including now)?: No  Utilities: Not At Risk (10/23/2023)   Received from [facility] Utilities    Threatened with loss of utilities: No  Health Literacy: Not on file     Physical Exam   Vitals:   07/23/24 0630 07/23/24 0645  BP: (!) 144/92 (!) 143/82  Pulse: 64 63  Resp:    Temp:    SpO2: 98% 99%    CONSTITUTIONAL: Well-appearing, NAD NEURO/PSYCH:  Alert and oriented x 3, no focal deficits EYES:  eyes equal and reactive ENT/NECK:  no LAD, no JVD CARDIO: Regular rate, well-perfused, normal S1 and S2 PULM:  CTAB no wheezing or rhonchi GI/GU:  non-distended, non-tender MSK/SPINE:  No gross deformities, no edema SKIN:  no rash, atraumatic   *Additional and/or pertinent findings included in MDM below  Diagnostic and Interventional Summary    EKG Interpretation Date/Time:    Ventricular Rate:    PR Interval:    QRS Duration:    QT Interval:    QTC Calculation:   R Axis:      Text Interpretation:         Labs Reviewed  CBC - Abnormal; Notable for the following components:      Result Value   RBC 3.91 (*)    Hemoglobin 11.3 (*)    HCT 35.3 (*)    All other components within normal limits  BASIC  METABOLIC PANEL WITH GFR - Abnormal; Notable for the following components:   Calcium  8.8 (*)    All other components within normal limits    CT FOOT LEFT W CONTRAST  Final Result      Medications  cefTRIAXone  (ROCEPHIN )  2 g in sodium chloride  0.9 % 100 mL IVPB (2 g Intravenous New Bag/Given 07/23/24 0647)  linezolid  (ZYVOX ) IVPB 600 mg (has no administration in time range)  oxyCODONE  (Oxy IR/ROXICODONE ) immediate release tablet 5 mg (5 mg Oral Given 07/23/24 0331)  iohexol  (OMNIPAQUE ) 300 MG/ML solution 100 mL (100 mLs Intravenous Contrast Given 07/23/24 0428)  lidocaine  (PF) (XYLOCAINE ) 1 % injection 10 mL (10 mLs Infiltration Given 07/23/24 9388)     Procedures  /  Critical Care Procedures  ED Course and Medical Decision Making  Initial Impression and Ddx Exam is most concerning for developing abscess to the dorsal aspect of the left foot just below the left pinky toe.  Some discoloration surrounding it, concerning for deeper space infection such as osteomyelitis.  Past medical/surgical history that increases complexity of ED encounter: Obesity, CHF, hypertension  Interpretation of Diagnostics I personally reviewed the Laboratory Testing and my interpretation is as follows: No significant blood count or electrolyte disturbance.  CT showing evidence of superficial abscess abutting the extensor tendon.  Patient Reassessment and Ultimate Disposition/Management     CT findings discussed with Dr. Beuford of orthopedics, appropriate for ER provider I&D, suspect need for admission.  With I&D attempt, very superficial abscess/blister to the dorsum of the foot was incised.  With attempted debridement/pressure however, purulent material drained from a small hole or ulcer on the plantar surface below the pinky toe.  This is concerning for a communicating abscess to this area possibly raising the complexity.  Plan is for hospitalist admission, possibly with orthopedics waiting and to  determine need for more formal I&D.  Starting antibiotics.  Patient management required discussion with the following services or consulting groups:  Hospitalist Service and Orthopedic Surgery  Complexity of Problems Addressed Acute illness or injury that poses threat of life of bodily function  Additional Data Reviewed and Analyzed Further history obtained from: Prior labs/imaging results  Additional Factors Impacting ED Encounter Risk Consideration of hospitalization  Ozell HERO. Theadore, MD [facility] Emergency Medicine [facility] [email or web address]  Final Clinical Impressions(s) / ED Diagnoses     ICD-10-CM   1. Cellulitis, unspecified cellulitis site  L03.90     2. Abscess  L02.91       ED Discharge Orders     None        Discharge Instructions Discussed with and Provided to Patient:   Discharge Instructions   None      Theadore Ozell HERO, MD 07/23/24 [phone]  "

## 2024-07-23 NOTE — Hospital Course (Addendum)
 43 y.o. male with medical history significant of OSA-s/p tracheotomy, HTN, chronic HFpEF, gout-who presented to the ED with left foot pain onset t Thursday  w/  pain/swelling and it got worse-on 12/22 when he woke up he noted there was a purulent discharge He was seen in ED given IV antibiotics sent home on Bactrim  and Keflex   but with no improvement In ED: CT left w/:Findings consistent with a subcutaneous abscess in the superolateral aspect of the forefoot, abutting and depressing the 5th extensor tendon, without CT evidence of acute osteomyelitis Orthopedics Dr. Beuford consulted advised bedside I&D, and antibiotics.  VSS- ;abs mild anemai wbc normal. US  left leg NEG for DVT on 12/22 S/p bedside I&d in ED> seen by ortho. MRI completed and resulted 12/26> Osteomyelitis of the fifth proximal phalanx and the fifth metatarsal head , s/c abscess measuring up to 4 x 2.4 x 2.1 cm with surrounding cellulitis. This abscess abuts the underlying traversing fifth extensor tendon. Underwent left foot first ray amputation and postop doing well with dressing intact Plan for antibiotics as per Ortho and ID. Patient to continue daily dressing change and as needed until seen in follow-up in 7 to 10 days in the orthopedic clinic  Subjective: Seen and examined Overnight remains afebrile and BP stable labs remained stable with mild anemia Resting comfortably.  Eager to go home.  Per bedside  Discharge Diagnoses:   Acute osteomyelitis of the 5th proximal phalanx and  MT  head  s/c abscess measuring up to 4 x 2.4 x 2.1 Left foot infection with superficial abscess/cellulitis: S/P bedside debridement in the ED. seen by orthopedic on admission and MRI-showed acute osteomyelitis fifth proximal phalanx and MT head  S/P amputation lefT foot 5TH ray>IntraOp culture Gram stain- GPC-culture moderate group B strep agalactiae isolated no anaerobes-ID following recommending 4 weeks of Augmentin  and ID clinic follow-up to  see if pathology margins clear then will stop antibiotic early. Patient is to follow-up in Ortho postop care. Treated with  Zyvox ,  Unasyn  (12/26>> Per ortho cont aspirin 81 twice daily for DVT prophylaxis x  30days-discussed today He will f/u  in 7 - 10 days for wound check Will discharge with dressing instruction Home health nurse  Prediabetes: A1c normal.  Chronic HFpEF: Is euvolemic, continue home Bumex , metoprolol  and monitor electrolytes. Net IO Since Admission: 2,350.15 mL [07/29/24 1051]   Hypokalemia: Resolved.    HTN Stable on metoprolol  and Bumex    Gout Stable .Not  taking meds at home   OSA s/p chronic tracheotomy Stable, routine trach care.   Morbid Obesity w/ Body mass index is 45.78 kg/m.: Will benefit with PCP follow-up, weight loss,healthy lifestyle  DVT prophylaxis: Lovenox  Code Status:   Code Status: Full Code Family Communication: plan of care discussed with patient at bedside. Patient status is: Remains hospitalized because of severity of illness.  Level of care: Telemetry  Dispo: The patient is from:Home            Anticipated disposition: home with home health  Objective: Vitals last 24 hrs: Vitals:   07/29/24 0544 07/29/24 0847 07/29/24 0853 07/29/24 1011  BP: 111/74 112/72  128/83  Pulse: (!) 57 61  (!) 59  Resp: 17   14  Temp: 97.8 F (36.6 C)   (!) 97.4 F (36.3 C)  TempSrc: Oral     SpO2: 94%  96% 99%  Weight:      Height:        Physical Examination: General exam: AAOX3 pleasant,  HEENT:Oral mucosa moist, Ear/Nose WNL, Trach site intact Respiratory system: Lungs bilaterally clear,  Cardiovascular system: S1 & S2 +, No JVD. Gastrointestinal system: Abdomen soft,NT,ND, BS+ Nervous System: Alert, awake, moving all extremities,and following commands. Extremities: extremities warm, left foot with dressing in place Skin: Warm, no rashes MSK: Normal muscle bulk,tone, power

## 2024-07-23 NOTE — Progress Notes (Signed)
 Patient asked if culture of the affected area would be collected and if infectious disease will be consulted. This nurse will communicate patient's inquiry to oncoming nurse for attending MD to follow-up.

## 2024-07-23 NOTE — ED Triage Notes (Signed)
 Pt reports he has cellulitis to left foot, was seen here yesterday for it and was given IV abx. He states he has returned tonight because now there is a large blister that has formed.

## 2024-07-23 NOTE — H&P (Addendum)
 " History and Physical   Referring Provider: Dr Theadore Telemedicine Provider: GORMAN Applebaum Provider Location: [place] Patient Location: [facility] [place] Referring Diagnosis: Foot abscess Patient Name and DOB verified: yes Patient consented to Telemedicine Evaluation:yes RN virtual assistant: Aleck Molt Video encounter time and date:07/23/24 at 8:05 AM   Patient: Derrick [name] FMW:[id number] DOB: 23-Jun-1981 DOA: 07/23/2024 DOS: the patient was seen and examined on 07/23/2024 PCP: [name], MD    Chief Complaint:  Chief Complaint  Patient presents with   Wound Check   HPI: Derrick [name] is a 43 y.o. male with medical history significant of OSA-s/p tracheotomy, HTN, chronic HFpEF, gout-who presented to the ED with worsening left foot pain.  Per patient-this past Thursday-he noted pain and swelling in the left side of his left foot.  He initially thought that he may have walked bumped it into something.  Unfortunately-the swelling/pain got worse-on 12/22 when he woke up he noted there was a purulent discharge.  He subsequently presented to the ED for evaluation-he was evaluated and given IV antibiotics-subsequently sent home on Bactrim  and Keflex .  Unfortunately-he continues to have worsening swelling/pain-so he presented back to the ED for a wound check.  EDMD did imaging studies which were consistent with an abscess, they discussed with orthopedics (Dr. Neda advised bedside I&D, and antibiotics.  Per ED MD-he did I&D on the dorsum of the left foot, but he noted purulent discharge coming out from the plantar aspect.  The hospitalist service was then asked to admit this patient for further evaluation and treatment.  Patient denies any fever No headache No nausea, vomiting or diarrhea No abdominal pain No dysuria/hematuria No hematochezia/melena    Review of Systems: As mentioned in the history of present illness. All other systems reviewed and are  negative. Past Medical History:  Diagnosis Date   Acute on chronic diastolic CHF (congestive heart failure), NYHA class 1 (HCC)    Cellulitis    Diabetes mellitus without complication (HCC)    GERD (gastroesophageal reflux disease)    Hypertension    Hypoxemia    Morbid obesity (HCC)    Obesity hypoventilation syndrome (HCC)    OSA (obstructive sleep apnea)    Reflux    Past Surgical History:  Procedure Laterality Date   TRACHEOSTOMY TUBE PLACEMENT N/A 06/21/2015   Procedure: TRACHEOSTOMY;  Surgeon: Daniel Moccasin, MD;  Location: [facility] OR;  Service: ENT;  Laterality: N/A;   Social History:  reports that he has never smoked. He has never used smokeless tobacco. He reports that he does not drink alcohol and does not use drugs.  Allergies[1]  Family History  Problem Relation Age of Onset   Diabetes Maternal Grandmother    Hypertension Maternal Grandmother    Asthma Other    Cancer Other    Stroke Other    Heart disease Other     Prior to Admission medications  Medication Sig Start Date End Date Taking? Authorizing Provider  acetaminophen  (TYLENOL ) 325 MG tablet Take 2 tablets (650 mg total) by mouth every 6 (six) hours as needed for mild pain or headache (fever >/= 101). 06/21/19   Danton Reyes DASEN, MD  albuterol  (VENTOLIN  HFA) 108 (90 Base) MCG/ACT inhaler Inhale 2 puffs into the lungs every 6 (six) hours as needed for wheezing or shortness of breath. 11/15/23   Elnor Lauraine BRAVO, NP  allopurinol  (ZYLOPRIM ) 300 MG tablet Take 1 tablet by mouth once daily 09/11/23   [name], MD  azelastine  (ASTELIN )  0.1 % nasal spray Place 2 sprays into both nostrils 2 (two) times daily. Use in each nostril as directed 08/24/23   Thedora Garnette HERO, MD  bumetanide  (BUMEX ) 0.5 MG tablet Take 1 tablet (0.5 mg total) by mouth daily. 05/31/23   Rollene Almarie LABOR, MD  cephALEXin  (KEFLEX ) 500 MG capsule Take 1 capsule (500 mg total) by mouth 3 (three) times daily for 7 days. 07/21/24 07/28/24  Lloyd,  Megan L, PA  clindamycin  (CLINDAGEL ) 1 % gel Apply 1 application topically daily. 06/10/19   [provider]  cyanocobalamin  (VITAMIN B12) 1000 MCG tablet Take 1 tablet (1,000 mcg total) by mouth daily. 06/13/22   Rollene Almarie LABOR, MD  dextromethorphan -guaiFENesin  (MUCINEX  DM) 30-600 MG 12hr tablet Take 1 tablet by mouth 2 (two) times daily. 11/15/21   Rollene Almarie LABOR, MD  gabapentin  (NEURONTIN ) 300 MG capsule TAKE 3 CAPSULES BY MOUTH THREE TIMES DAILY 04/21/24   Rollene Almarie LABOR, MD  levocetirizine (XYZAL ) 5 MG tablet Take 1 tablet (5 mg total) by mouth every evening. 11/15/23   Elnor Lauraine BRAVO, NP  metoCLOPramide (REGLAN) 5 MG/5ML solution Take 10 mg by mouth 4 (four) times daily. 03/10/20   [provider]  metoprolol  tartrate (LOPRESSOR ) 50 MG tablet Take 1 tablet by mouth twice daily 06/19/24   Rollene Almarie LABOR, MD  montelukast  (SINGULAIR ) 10 MG tablet Take 1 tablet (10 mg total) by mouth at bedtime. 08/23/22   Rollene Almarie LABOR, MD  nystatin  (MYCOSTATIN /NYSTOP ) powder Apply 1 Application topically 3 (three) times daily. 08/23/22   Rollene Almarie LABOR, MD  omeprazole (PRILOSEC) 40 MG capsule Take 40 mg by mouth daily. 03/08/20   [provider]  ondansetron  (ZOFRAN ) 4 MG tablet Take 1 tablet (4 mg total) by mouth every 8 (eight) hours as needed for nausea or vomiting. 06/24/19   Rollene Almarie LABOR, MD  [facility] DELICA LANCETS 33G MISC Use as needed daily 11/30/16   Rollene Almarie LABOR, MD  potassium chloride  (KLOR-CON ) 20 MEQ packet Take 20 mEq by mouth daily as needed (take with lasix  only). 11/06/23   Rollene Almarie LABOR, MD  Spacer/Aero-Holding Chambers (PRO COMFORT SPACER ADULT) MISC UAD with inhaler 09/23/21   Geofm Glade PARAS, MD  sulfamethoxazole -trimethoprim  (BACTRIM  DS) 800-160 MG tablet Take 1 tablet by mouth 2 (two) times daily for 7 days. 07/21/24 07/28/24  Lloyd, Megan L, PA  tadalafil  (CIALIS ) 10 MG tablet Take 1 tablet (10 mg total) by  mouth every other day as needed for erectile dysfunction. 11/15/23   Elnor Lauraine BRAVO, NP  ursodiol (ACTIGALL) 300 MG capsule Take 300 mg by mouth 2 (two) times daily. 03/19/20   [provider]    Physical Exam:Bedside exam done by Aleck Molt RN Vitals:   07/23/24 0630 07/23/24 0645 07/23/24 0700 07/23/24 0800  BP: (!) 144/92 (!) 143/82 (!) 144/94 131/85  Pulse: 64 63 65 65  Resp:      Temp:      TempSrc:      SpO2: 98% 99% 100% 94%  Weight:      Height:       Gen Exam:Alert awake-not in any distress HEENT:atraumatic, normocephalic Chest: B/L clear to auscultation anteriorly CVS:S1S2 regular Abdomen:soft non tender, non distended Extremities: Appears to have edema-the lower leg-purulent discharge and wound-see pictures below. Neurology: Non focal Skin: no rash            Data Reviewed:    Latest Ref Rng & Units 07/23/2024    3:32 AM 07/21/2024  4:28 PM 05/30/2024    3:49 PM  CBC  WBC 4.0 - 10.5 K/uL 7.7  8.9  7.3   Hemoglobin 13.0 - 17.0 g/dL 88.6  88.9  88.4   Hematocrit 39.0 - 52.0 % 35.3  34.0  34.8   Platelets 150 - 400 K/uL 207  189  202.0          Latest Ref Rng & Units 07/23/2024    3:32 AM 07/21/2024    4:28 PM 05/30/2024    3:49 PM  BMP  Glucose 70 - 99 mg/dL 97  895  90   BUN 6 - 20 mg/dL 10  15  17    Creatinine 0.61 - 1.24 mg/dL 8.76  8.93  8.67   Sodium 135 - 145 mmol/L 141  141  140   Potassium 3.5 - 5.1 mmol/L 3.5  3.3  3.4   Chloride 98 - 111 mmol/L 102  105  104   CO2 22 - 32 mmol/L 29  28  31    Calcium  8.9 - 10.3 mg/dL 8.8  8.5  8.5     Assessment and Plan: Left foot abscess/purulent cellulitis Failed outpatient oral antibiotics-s/p I&D at bedside in the ED-per ED MD he did a I&D on the dorsum of the lateral aspect of left foot, but patient continues to have purulent discharge from the plantar aspect. Continue Zyvox  I have discussed case with orthopedics PA-C-Dr. Ozell Lincoln will evaluate when patient  arrives-and provide formal recommendations with the patient needs I&D.  Chronic HFpEF Continue Bumex /beta-blocker Appears relatively well compensated-left extremity edema is likely secondary to infection.  HTN Continue metoprolol -follow BP trend  Gout No flare Per patient he is noncompliant to his gout medications  OSA s/p chronic tracheotomy Routine trach care.   Advance Care Planning:   Code Status: Full Code   Consults: Orthopedics  Family Communication: None at bedside  Severity of Illness: The appropriate patient status for this patient is INPATIENT. Inpatient status is judged to be reasonable and necessary in order to provide the required intensity of service to ensure the patient's safety. The patient's presenting symptoms, physical exam findings, and initial radiographic and laboratory data in the context of their chronic comorbidities is felt to place them at high risk for further clinical deterioration. Furthermore, it is not anticipated that the patient will be medically stable for discharge from the hospital within 2 midnights of admission.   * I certify that at the point of admission it is my clinical judgment that the patient will require inpatient hospital care spanning beyond 2 midnights from the point of admission due to high intensity of service, high risk for further deterioration and high frequency of surveillance required.*  Author: Donalda Applebaum, MD 07/23/2024 9:01 AM  For on call review [email or web address].      [1]  Allergies Allergen Reactions   Vancomycin Other (See Comments)    Affected kidneys    Shrimp [Shellfish Allergy]    Zosyn [Piperacillin Sod-Tazobactam So] Other (See Comments)   "

## 2024-07-24 ENCOUNTER — Other Ambulatory Visit: Payer: Self-pay | Admitting: Internal Medicine

## 2024-07-24 ENCOUNTER — Other Ambulatory Visit: Payer: Self-pay | Admitting: Nurse Practitioner

## 2024-07-24 DIAGNOSIS — L02612 Cutaneous abscess of left foot: Secondary | ICD-10-CM | POA: Diagnosis not present

## 2024-07-24 DIAGNOSIS — N528 Other male erectile dysfunction: Secondary | ICD-10-CM

## 2024-07-24 DIAGNOSIS — E118 Type 2 diabetes mellitus with unspecified complications: Secondary | ICD-10-CM

## 2024-07-24 LAB — CBC
HCT: 33.1 % — ABNORMAL LOW (ref 39.0–52.0)
Hemoglobin: 10.6 g/dL — ABNORMAL LOW (ref 13.0–17.0)
MCH: 28.6 pg (ref 26.0–34.0)
MCHC: 32 g/dL (ref 30.0–36.0)
MCV: 89.5 fL (ref 80.0–100.0)
Platelets: 186 K/uL (ref 150–400)
RBC: 3.7 MIL/uL — ABNORMAL LOW (ref 4.22–5.81)
RDW: 13.7 % (ref 11.5–15.5)
WBC: 5 K/uL (ref 4.0–10.5)
nRBC: 0 % (ref 0.0–0.2)

## 2024-07-24 LAB — COMPREHENSIVE METABOLIC PANEL WITH GFR
ALT: 21 U/L (ref 0–44)
AST: 21 U/L (ref 15–41)
Albumin: 3.3 g/dL — ABNORMAL LOW (ref 3.5–5.0)
Alkaline Phosphatase: 89 U/L (ref 38–126)
Anion gap: 8 (ref 5–15)
BUN: 11 mg/dL (ref 6–20)
CO2: 30 mmol/L (ref 22–32)
Calcium: 8.5 mg/dL — ABNORMAL LOW (ref 8.9–10.3)
Chloride: 102 mmol/L (ref 98–111)
Creatinine, Ser: 1.22 mg/dL (ref 0.61–1.24)
GFR, Estimated: >60 mL/min
Glucose, Bld: 92 mg/dL (ref 70–99)
Potassium: 3.3 mmol/L — ABNORMAL LOW (ref 3.5–5.1)
Sodium: 140 mmol/L (ref 135–145)
Total Bilirubin: 0.3 mg/dL (ref 0.0–1.2)
Total Protein: 6.4 g/dL — ABNORMAL LOW (ref 6.5–8.1)

## 2024-07-24 LAB — HEMOGLOBIN A1C
Hgb A1c MFr Bld: 5.1 % (ref 4.8–5.6)
Mean Plasma Glucose: 99.67 mg/dL

## 2024-07-24 MED ORDER — LIDOCAINE HCL (PF) 1 % IJ SOLN
5.0000 mL | INTRAMUSCULAR | Status: DC
  Administered 2024-07-24: 5 mL

## 2024-07-24 MED ORDER — POTASSIUM CHLORIDE CRYS ER 20 MEQ PO TBCR
40.0000 meq | EXTENDED_RELEASE_TABLET | Freq: Once | ORAL | Status: AC
  Administered 2024-07-24: 40 meq via ORAL
  Filled 2024-07-24: qty 2

## 2024-07-24 MED ORDER — LINEZOLID 600 MG PO TABS
600.0000 mg | ORAL_TABLET | Freq: Two times a day (BID) | ORAL | Status: DC
  Administered 2024-07-24 – 2024-07-29 (×11): 600 mg via ORAL
  Filled 2024-07-24 (×8): qty 1

## 2024-07-24 NOTE — Plan of Care (Signed)
?  Problem: Education: ?Goal: Knowledge of General Education information will improve ?Description: Including pain rating scale, medication(s)/side effects and non-pharmacologic comfort measures ?Outcome: Progressing ?  ?Problem: Health Behavior/Discharge Planning: ?Goal: Ability to manage health-related needs will improve ?Outcome: Progressing ?  ?Problem: Activity: ?Goal: Risk for activity intolerance will decrease ?Outcome: Progressing ?  ?Problem: Nutrition: ?Goal: Adequate nutrition will be maintained ?Outcome: Progressing ?  ?Problem: Elimination: ?Goal: Will not experience complications related to urinary retention ?Outcome: Progressing ?  ?

## 2024-07-24 NOTE — Progress Notes (Signed)
 " PROGRESS NOTE Derrick [name]  FMW:[id number] DOB: 04-Dec-1980 DOA: 07/23/2024 PCP: [name], MD  Brief Narrative/Hospital Course: 43 y.o. male with medical history significant of OSA-s/p tracheotomy, HTN, chronic HFpEF, gout-who presented to the ED with left foot pain onset t Thursday  w/  pain/swelling and it got worse-on 12/22 when he woke up he noted there was a purulent discharge He was seen in ED given IV antibiotics sent home on Bactrim  and Keflex   but with no improvement In ED: CT left w/:Findings consistent with a subcutaneous abscess in the superolateral aspect of the forefoot, abutting and depressing the 5th extensor tendon, without CT evidence of acute osteomyelitis Orthopedics Dr. Beuford consulted advised bedside I&D, and antibiotics.  VSS- ;abs mild anemai wbc normal US  left leg NEG for DVT on 12/22  Subjective: Seen and examined today Reports his pain was worse with ambulation yesterday No new complaints Overnight  afebrile, VSS, Labs -mild hypokalemia otherwise more or less stable  Assessment and plan:  Left foot infection with superficial abscess/cellulitis: Status post bedside debridement in the ED. continue current Zyvox , Invanz. Orthopedic following,  MRI completed, result pending. Will follow orthopedic recommendation- ? If we can obtain sample for C/S.   Prediabetes: A1c normal  Chronic HFpEF: Remains euvolemic, continue home Bumex , metoprolol .  Monitor leg edema  Hypokalemia: Replace, monitor while on Bumex    HTN Well-controlled on home metoprolol     Gout Stable .  Not  taking meds at home   OSA s/p chronic tracheotomy Stable, routine trach care.   Morbid Obesity w/ Body mass index is 45.78 kg/m.: Will benefit with PCP follow-up, weight loss,healthy lifestyle  DVT prophylaxis: Lovenox  Code Status:   Code Status: Full Code Family Communication: plan of care discussed with patient at bedside. Patient status is: Remains  hospitalized because of severity of illness Level of care: Telemetry   Dispo: The patient is from: home            Anticipated disposition: TBD Objective: Vitals last 24 hrs: Vitals:   07/23/24 1527 07/23/24 1821 07/23/24 2138 07/24/24 0204  BP: 131/87 (!) 145/98 138/78 131/84  Pulse: 81 72 70 68  Resp: 18 17 16 18   Temp: 98 F (36.7 C) 98.1 F (36.7 C) 97.8 F (36.6 C) 97.9 F (36.6 C)  TempSrc: Oral Oral Oral Oral  SpO2: 98% 100% 99% 97%  Weight:      Height:        Physical Examination: General exam: alert awake, oriented obese HEENT:Oral mucosa moist, Ear/Nose WNL grossly tracheotomy site intact Respiratory system: Bilaterally clear BS,no use of accessory muscle Cardiovascular system: S1 & S2 +, No JVD. Gastrointestinal system: Abdomen soft,NT,ND, BS+ Nervous System: Alert, awake, moving all extremities,and following commands. Extremities: extremities warm, leg edema + w/ rt foot in dressing Skin: Warm, no rashes MSK: Normal muscle bulk,tone, power   Medications reviewed:  Scheduled Meds:  bumetanide   0.5 mg Oral Daily   cetirizine   5 mg Oral QPM   cyanocobalamin   1,000 mcg Oral Daily   enoxaparin  (LOVENOX ) injection  0.5 mg/kg Subcutaneous Q24H   gabapentin   900 mg Oral TID   lidocaine  (PF)  5 mL     metoprolol  tartrate  50 mg Oral BID   pantoprazole   40 mg Oral Daily   Continuous Infusions:  linezolid  (ZYVOX ) IV 600 mg (07/23/24 2125)   Diet: Diet Order             Diet Heart Room service appropriate? Yes; Fluid  consistency: Thin; Fluid restriction: 1500 mL Fluid  Diet effective now                   Data Reviewed: I have personally reviewed following labs and imaging studies ( see epic result tab) CBC: Recent Labs  Lab 07/21/24 1628 07/23/24 0332 07/23/24 1546 07/24/24 0230  WBC 8.9 7.7 5.2 5.0  NEUTROABS 6.2  --   --   --   HGB 11.0* 11.3* 11.5* 10.6*  HCT 34.0* 35.3* 36.4* 33.1*  MCV 90.7 90.3 91.7 89.5  PLT 189 207 199 186    CMP: Recent Labs  Lab 07/21/24 1628 07/23/24 0332 07/23/24 1546 07/24/24 0230  NA 141 141  --  140  K 3.3* 3.5  --  3.3*  CL 105 102  --  102  CO2 28 29  --  30  GLUCOSE 104* 97  --  92  BUN 15 10  --  11  CREATININE 1.06 1.23 1.24 1.22  CALCIUM  8.5* 8.8*  --  8.5*   GFR: Estimated Creatinine Clearance: 110.1 mL/min (by C-G formula based on SCr of 1.22 mg/dL). Recent Labs  Lab 07/21/24 1628 07/24/24 0230  AST 25 21  ALT 20 21  ALKPHOS 96 89  BILITOT 0.4 0.3  PROT 6.7 6.4*  ALBUMIN 3.6 3.3*   No results for input(s): LIPASE, AMYLASE in the last 168 hours. No results for input(s): AMMONIA in the last 168 hours. Coagulation Profile: No results for input(s): INR, PROTIME in the last 168 hours. Unresulted Labs (From admission, onward)     Start     Ordered   07/30/24 0500  Creatinine, serum  (enoxaparin  (LOVENOX )    CrCl >/= 30 ml/min)  Weekly,   R     Comments: while on enoxaparin  therapy    07/23/24 1459   07/25/24 0500  Basic metabolic panel with GFR  Tomorrow morning,   R        07/24/24 0650   07/25/24 0500  CBC  Tomorrow morning,   R        07/24/24 0650           Antimicrobials/Microbiology: Anti-infectives (From admission, onward)    Start     Dose/Rate Route Frequency Ordered Stop   07/24/24 1115  linezolid  (ZYVOX ) tablet 600 mg        600 mg Oral Every 12 hours 07/24/24 1018     07/23/24 1000  linezolid  (ZYVOX ) IVPB 600 mg  Status:  Discontinued        600 mg 300 mL/hr over 60 Minutes Intravenous Every 12 hours 07/23/24 0711 07/24/24 1018   07/23/24 0645  cefTRIAXone  (ROCEPHIN ) 2 g in sodium chloride  0.9 % 100 mL IVPB        2 g 200 mL/hr over 30 Minutes Intravenous  Once 07/23/24 0639 07/23/24 0723         Component Value Date/Time   SDES TRACHEAL SITE 02/13/2017 1330   SPECREQUEST NONE 02/13/2017 1330   CULT  02/13/2017 1330    FEW STAPHYLOCOCCUS AUREUS DIPHTHEROIDS(CORYNEBACTERIUM SPECIES) Standardized susceptibility testing for  this organism is not available.    REPTSTATUS 02/19/2017 FINAL 02/13/2017 1330  Procedures: Mennie LAMY, MD [facility] 07/24/2024, 10:39 AM   "

## 2024-07-24 NOTE — Progress Notes (Signed)
 PHARMACIST - PHYSICIAN COMMUNICATION DR:   Christobal CONCERNING: Antibiotic IV to Oral Route Change Policy  RECOMMENDATION: This patient is receiving linezolid  by the intravenous route.  Based on criteria approved by the Pharmacy and Therapeutics Committee, the antibiotic(s) is/are being converted to the equivalent oral dose form(s).   DESCRIPTION: These criteria include: Patient being treated for a respiratory tract infection, urinary tract infection, cellulitis or clostridium difficile associated diarrhea if on metronidazole The patient is not neutropenic and does not exhibit a GI malabsorption state The patient is eating (either orally or via tube) and/or has been taking other orally administered medications for a least 24 hours The patient is improving clinically and has a Tmax < 100.5  If you have questions about this conversion, please contact the Pharmacy Department  []   [phone] )  [facility] []   [phone] )  [facility] [x]   [phone] )  [facility] []   [phone] )  [facility] []   [phone] )  [facility]

## 2024-07-24 NOTE — Progress Notes (Signed)
 K 3.3, ok to replace with 40 meq x1 per Dr. Bufford Sergio Batch, PharmD, BCIDP, AAHIVP, CPP Infectious Disease Pharmacist 07/24/2024 10:27 AM

## 2024-07-25 DIAGNOSIS — L02612 Cutaneous abscess of left foot: Secondary | ICD-10-CM | POA: Diagnosis not present

## 2024-07-25 LAB — BASIC METABOLIC PANEL WITH GFR
Anion gap: 6 (ref 5–15)
BUN: 10 mg/dL (ref 6–20)
CO2: 30 mmol/L (ref 22–32)
Calcium: 8.7 mg/dL — ABNORMAL LOW (ref 8.9–10.3)
Chloride: 105 mmol/L (ref 98–111)
Creatinine, Ser: 1.19 mg/dL (ref 0.61–1.24)
GFR, Estimated: >60 mL/min
Glucose, Bld: 91 mg/dL (ref 70–99)
Potassium: 3.7 mmol/L (ref 3.5–5.1)
Sodium: 141 mmol/L (ref 135–145)

## 2024-07-25 LAB — CBC
HCT: 35.2 % — ABNORMAL LOW (ref 39.0–52.0)
Hemoglobin: 11.1 g/dL — ABNORMAL LOW (ref 13.0–17.0)
MCH: 28.6 pg (ref 26.0–34.0)
MCHC: 31.5 g/dL (ref 30.0–36.0)
MCV: 90.7 fL (ref 80.0–100.0)
Platelets: 189 K/uL (ref 150–400)
RBC: 3.88 MIL/uL — ABNORMAL LOW (ref 4.22–5.81)
RDW: 13.6 % (ref 11.5–15.5)
WBC: 4.7 K/uL (ref 4.0–10.5)
nRBC: 0 % (ref 0.0–0.2)

## 2024-07-25 MED ORDER — SODIUM CHLORIDE 0.9 % IV SOLN
3.0000 g | Freq: Four times a day (QID) | INTRAVENOUS | Status: DC
  Administered 2024-07-25 – 2024-07-29 (×16): 3 g via INTRAVENOUS
  Filled 2024-07-25 (×10): qty 8

## 2024-07-25 MED ORDER — POVIDONE-IODINE 10 % EX SWAB: 2.0000 | Freq: Once | CUTANEOUS | Status: DC

## 2024-07-25 MED ORDER — CEFAZOLIN SODIUM-DEXTROSE 3-4 GM/150ML-% IV SOLN: 3.0000 g | INTRAVENOUS | Status: DC

## 2024-07-25 MED ORDER — CHLORHEXIDINE GLUCONATE 4 % EX SOLN
60.0000 mL | Freq: Once | CUTANEOUS | Status: AC
  Administered 2024-07-26: 4 via TOPICAL
  Filled 2024-07-25: qty 60

## 2024-07-25 MED ORDER — FLUCONAZOLE 150 MG PO TABS
150.0000 mg | ORAL_TABLET | Freq: Once | ORAL | Status: AC
  Administered 2024-07-25: 150 mg via ORAL
  Filled 2024-07-25: qty 1

## 2024-07-25 NOTE — Plan of Care (Signed)
" °  Problem: Education: Goal: Knowledge of General Education information will improve Description: Including pain rating scale, medication(s)/side effects and non-pharmacologic comfort measures Outcome: Progressing   Problem: Health Behavior/Discharge Planning: Goal: Ability to manage health-related needs will improve Outcome: Progressing   Problem: Activity: Goal: Risk for activity intolerance will decrease Outcome: Progressing   Problem: Nutrition: Goal: Adequate nutrition will be maintained Outcome: Progressing   Problem: Elimination: Goal: Will not experience complications related to urinary retention Outcome: Progressing   Problem: Safety: Goal: Ability to remain free from injury will improve Outcome: Progressing   "

## 2024-07-25 NOTE — Progress Notes (Signed)
 Patient ID: NYZIR DUBOIS, male   DOB: [birth year], 43 y.o.   MRN: [id number]  Reviewed MRI results that show reaccumulated abscess and 5th phalangeal and MT osteo. Plan I&D and 5th ray amp tomorrow with Dr. Barton, please keep NPO after MN. Pt not sure he's ready for amputation, will ask primary team for ID consult to discuss medical treatment.    Ozell DOROTHA Ned, PA-C Orthopedic Surgery [phone]

## 2024-07-25 NOTE — Progress Notes (Signed)
 " PROGRESS NOTE Derrick [name]  FMW:[id number] DOB: [birth year] DOA: 07/23/2024 PCP: [name], MD  Brief Narrative/Hospital Course: 43 y.o. male with medical history significant of OSA-s/p tracheotomy, HTN, chronic HFpEF, gout-who presented to the ED with left foot pain onset t Thursday  w/  pain/swelling and it got worse-on 12/22 when he woke up he noted there was a purulent discharge He was seen in ED given IV antibiotics sent home on Bactrim  and Keflex   but with no improvement In ED: CT left w/:Findings consistent with a subcutaneous abscess in the superolateral aspect of the forefoot, abutting and depressing the 5th extensor tendon, without CT evidence of acute osteomyelitis Orthopedics Dr. Beuford consulted advised bedside I&D, and antibiotics.  VSS- ;abs mild anemai wbc normal US  left leg NEG for DVT on 12/22  Subjective: Seen and examined He washed himself up currently on the bedside chair doing well. Overnight afebrile on room air Labs reviewed 8 lites stable CBC stable MRI report still pending radiology called  Assessment and plan:  Left foot infection with superficial abscess/cellulitis: Status post bedside debridement in the ED.  Remains afebrile WBC count normal, continue Zyvox   Orthopedic following,  MRI result pending. Will follow orthopedic recommendation- ? If we can obtain sample for C/S.   Prediabetes: A1c normal  Chronic HFpEF: Is euvolemic, continue home Bumex , metoprolol  and monitor electrolytes.  Hypokalemia: Resolved  HTN Well-controlled on home metoprolol  and Bumex    Gout Stable .Not  taking meds at home   OSA s/p chronic tracheotomy Stable, routine trach care.   Morbid Obesity w/ Body mass index is 45.78 kg/m.: Will benefit with PCP follow-up, weight loss,healthy lifestyle  DVT prophylaxis: Lovenox  Code Status:   Code Status: Full Code Family Communication: plan of care discussed with patient at bedside. Patient status is:  Remains hospitalized because of severity of illness Level of care: Telemetry   Dispo: The patient is from: home            Anticipated disposition: TBD Objective: Vitals last 24 hrs: Vitals:   07/24/24 1935 07/25/24 0444 07/25/24 0848 07/25/24 1029  BP: (!) 145/95 121/66  129/83  Pulse: 71 64 66   Resp: 18 16 17    Temp: 97.9 F (36.6 C) 98 F (36.7 C)    TempSrc: Oral Oral    SpO2: 100% 98% 98%   Weight:      Height:        Physical Examination: General exam: AAOX3, pleasant HEENT:Oral mucosa moist, Ear/Nose WNL, Trach site intact Respiratory system: Bilaterally clear  Cardiovascular system: S1 & S2 +, No JVD. Gastrointestinal system: Abdomen soft,NT,ND, BS+ Nervous System: Alert, awake, moving all extremities,and following commands. Extremities: extremities warm, left foot abscess site raised  mild drainage Skin: Warm, no rashes MSK: Normal muscle bulk,tone, power   Medications reviewed:  Scheduled Meds:  bumetanide   0.5 mg Oral Daily   cetirizine   5 mg Oral QPM   cyanocobalamin   1,000 mcg Oral Daily   enoxaparin  (LOVENOX ) injection  0.5 mg/kg Subcutaneous Q24H   gabapentin   900 mg Oral TID   lidocaine  (PF)  5 mL     linezolid   600 mg Oral Q12H   metoprolol  tartrate  50 mg Oral BID   pantoprazole   40 mg Oral Daily   Continuous Infusions:   Diet: Diet Order             Diet Heart Room service appropriate? Yes; Fluid consistency: Thin; Fluid restriction: 1500 mL Fluid  Diet effective now  Data Reviewed: I have personally reviewed following labs and imaging studies ( see epic result tab) CBC: Recent Labs  Lab 07/21/24 1628 07/23/24 0332 07/23/24 1546 07/24/24 0230 07/25/24 0649  WBC 8.9 7.7 5.2 5.0 4.7  NEUTROABS 6.2  --   --   --   --   HGB 11.0* 11.3* 11.5* 10.6* 11.1*  HCT 34.0* 35.3* 36.4* 33.1* 35.2*  MCV 90.7 90.3 91.7 89.5 90.7  PLT 189 207 199 186 189   CMP: Recent Labs  Lab 07/21/24 1628 07/23/24 0332 07/23/24 1546  07/24/24 0230 07/25/24 0649  NA 141 141  --  140 141  K 3.3* 3.5  --  3.3* 3.7  CL 105 102  --  102 105  CO2 28 29  --  30 30  GLUCOSE 104* 97  --  92 91  BUN 15 10  --  11 10  CREATININE 1.06 1.23 1.24 1.22 1.19  CALCIUM  8.5* 8.8*  --  8.5* 8.7*   GFR: Estimated Creatinine Clearance: 112.9 mL/min (by C-G formula based on SCr of 1.19 mg/dL). Recent Labs  Lab 07/21/24 1628 07/24/24 0230  AST 25 21  ALT 20 21  ALKPHOS 96 89  BILITOT 0.4 0.3  PROT 6.7 6.4*  ALBUMIN 3.6 3.3*   No results for input(s): LIPASE, AMYLASE in the last 168 hours. No results for input(s): AMMONIA in the last 168 hours. Coagulation Profile: No results for input(s): INR, PROTIME in the last 168 hours. Unresulted Labs (From admission, onward)     Start     Ordered   07/30/24 0500  Creatinine, serum  (enoxaparin  (LOVENOX )    CrCl >/= 30 ml/min)  Weekly,   R     Comments: while on enoxaparin  therapy    07/23/24 1459           Antimicrobials/Microbiology: Anti-infectives (From admission, onward)    Start     Dose/Rate Route Frequency Ordered Stop   07/24/24 1115  linezolid  (ZYVOX ) tablet 600 mg        600 mg Oral Every 12 hours 07/24/24 1018     07/23/24 1000  linezolid  (ZYVOX ) IVPB 600 mg  Status:  Discontinued        600 mg 300 mL/hr over 60 Minutes Intravenous Every 12 hours 07/23/24 0711 07/24/24 1018   07/23/24 0645  cefTRIAXone  (ROCEPHIN ) 2 g in sodium chloride  0.9 % 100 mL IVPB        2 g 200 mL/hr over 30 Minutes Intravenous  Once 07/23/24 0639 07/23/24 0723         Component Value Date/Time   SDES TRACHEAL SITE 02/13/2017 1330   SPECREQUEST NONE 02/13/2017 1330   CULT  02/13/2017 1330    FEW STAPHYLOCOCCUS AUREUS DIPHTHEROIDS(CORYNEBACTERIUM SPECIES) Standardized susceptibility testing for this organism is not available.    REPTSTATUS 02/19/2017 FINAL 02/13/2017 1330  Procedures: Mennie LAMY, MD [facility] 07/25/2024, 10:49 AM   "

## 2024-07-25 NOTE — TOC Initial Note (Signed)
 Transition of Care [facility]) - Initial/Assessment Note    Patient Details  Name: Derrick [name] MRN: [id number] Date of Birth: Jun 20, 1981  Transition of Care [facility]) CM/SW Contact:    Jeoffrey LITTIE Moose, [place] Phone Number: 07/25/2024, 9:38 AM  Clinical Narrative:                 Pt admitted from home due to cellulitis in L foot. No current ICM needs, please consult as needs arise.        Patient Goals and CMS Choice            Expected Discharge Plan and Services                                              Prior Living Arrangements/Services                       Activities of Daily Living   ADL Screening (condition at time of admission) Independently performs ADLs?: Yes (appropriate for developmental age) Is the patient deaf or have difficulty hearing?: No Does the patient have difficulty seeing, even when wearing glasses/contacts?: No Does the patient have difficulty concentrating, remembering, or making decisions?: No  Permission Sought/Granted                  Emotional Assessment              Admission diagnosis:  Abscess [L02.91] Foot abscess, left [L02.612] Cellulitis, unspecified cellulitis site [L03.90] Patient Active Problem List   Diagnosis Date Noted   Foot abscess, left 07/23/2024   Pain of left knee and lower leg 05/30/2024   Cellulitis of left lower extremity 03/17/2024   Acute sinusitis 11/15/2023   Urinary frequency 06/01/2023   B12 deficiency 11/15/2021   Rash 04/13/2021   Left leg swelling 11/24/2019   Chronic respiratory failure with hypoxia (HCC) 06/17/2019   Chronic pain syndrome 06/17/2019   Vitamin D  deficiency 11/12/2017   Tinnitus 09/27/2017   Numbness and tingling of hand 11/30/2016   Tracheostomy dependent (HCC)    Routine general medical examination at a health care facility 09/21/2016   Acne 09/21/2016   Viral pharyngitis 09/03/2016   ED (erectile dysfunction) 06/02/2016   Acute cough     Other allergic rhinitis    History of diabetes mellitus 08/11/2015   Dysphagia    Pulmonary hypertension (HCC)    Morbid obesity (HCC) 06/13/2015   Essential hypertension 06/13/2015   OSA (obstructive sleep apnea) 06/13/2015   Obesity hypoventilation syndrome (HCC) 06/13/2015   Chronic diastolic heart failure (HCC) 06/13/2015   PCP:  [name], MD Pharmacy:   [facility] [address] [place], [place] - [address] [place] [address] Phone: [phone] Fax: [phone]  [id number] - [place], [place] - [address] AT [facility] [address] [place] [address] Phone: [phone] Fax: [phone]     Social Drivers of Health (SDOH) Social History: SDOH Screenings   Food Insecurity: No Food Insecurity (07/23/2024)  Housing: Unknown (07/23/2024)  Transportation Needs: No Transportation Needs (07/23/2024)  Utilities: Not At Risk (07/23/2024)  Depression (PHQ2-9): Low Risk (05/30/2024)  Financial Resource Strain: Low Risk (08/02/2023)   Received from [facility]  Physical Activity: Insufficiently Active (08/02/2023)   [facility] from [facility]  Social Connections: Socially Integrated (08/02/2023)   [facility] from [facility]  Stress: No Stress Concern Present (10/23/2023)   Received from [facility]  Recent Concern: Stress - Stress Concern Present (08/02/2023)   Received from [facility]  Tobacco Use: Low Risk (07/23/2024)   SDOH Interventions: Food Insecurity Interventions: Intervention Not Indicated Housing Interventions: Intervention Not Indicated Transportation Interventions: Intervention Not Indicated Utilities Interventions: Intervention Not Indicated   Readmission Risk Interventions     No data to display

## 2024-07-25 NOTE — TOC Initial Note (Signed)
 Transition of Care (TOC) - Initial/Assessment Note   Spoke to patient at bedside.   Patient from home alone. Patient independent with trach care and ambulation.   Patient has suction, CPAP and humidified air through [facility]    Transition of Care Department [facility]) has reviewed patient and no TOC needs have been identified at this time. We will continue to monitor patient advancement through interdisciplinary progression rounds. If new patient transition needs arise, please place a TOC consult.     Patient Details  Name: Derrick [name] MRN: [id number] Date of Birth: 1981/05/08  Transition of Care [facility]) CM/SW Contact:    Stephane Powell Jansky, RN Phone Number: 07/25/2024, 10:43 AM  Clinical Narrative:                   Expected Discharge Plan: Home/Self Care Barriers to Discharge: Continued Medical Work up   Patient Goals and CMS Choice Patient states their goals for this hospitalization and ongoing recovery are:: to return to home   Choice offered to / list presented to : NA      Expected Discharge Plan and Services In-house Referral: NA Discharge Planning Services: CM Consult Post Acute Care Choice: NA Living arrangements for the past 2 months: Single Family Home                 DME Arranged: N/A DME Agency: NA       HH Arranged: NA HH Agency: NA        Prior Living Arrangements/Services Living arrangements for the past 2 months: Single Family Home Lives with:: Self Patient language and need for interpreter reviewed:: Yes Do you feel safe going back to the place where you live?: Yes      Need for Family Participation in Patient Care: No (Comment) Care giver support system in place?: No (comment) Current home services: DME Criminal Activity/Legal Involvement Pertinent to Current Situation/Hospitalization: No - Comment as needed  Activities of Daily Living   ADL Screening (condition at time of admission) Independently performs ADLs?: Yes  (appropriate for developmental age) Is the patient deaf or have difficulty hearing?: No Does the patient have difficulty seeing, even when wearing glasses/contacts?: No Does the patient have difficulty concentrating, remembering, or making decisions?: No  Permission Sought/Granted   Permission granted to share information with : No              Emotional Assessment Appearance:: Appears stated age Attitude/Demeanor/Rapport: Engaged Affect (typically observed): Appropriate Orientation: : Oriented to Self, Oriented to Place, Oriented to  Time, Oriented to Situation Alcohol / Substance Use: Not Applicable Psych Involvement: No (comment)  Admission diagnosis:  Abscess [L02.91] Foot abscess, left [L02.612] Cellulitis, unspecified cellulitis site [L03.90] Patient Active Problem List   Diagnosis Date Noted   Foot abscess, left 07/23/2024   Pain of left knee and lower leg 05/30/2024   Cellulitis of left lower extremity 03/17/2024   Acute sinusitis 11/15/2023   Urinary frequency 06/01/2023   B12 deficiency 11/15/2021   Rash 04/13/2021   Left leg swelling 11/24/2019   Chronic respiratory failure with hypoxia (HCC) 06/17/2019   Chronic pain syndrome 06/17/2019   Vitamin D  deficiency 11/12/2017   Tinnitus 09/27/2017   Numbness and tingling of hand 11/30/2016   Tracheostomy dependent [facility])    Routine general medical examination at a health care facility 09/21/2016   Acne 09/21/2016   Viral pharyngitis 09/03/2016   ED (erectile dysfunction) 06/02/2016   Acute cough    Other allergic rhinitis  History of diabetes mellitus 08/11/2015   Dysphagia    Pulmonary hypertension (HCC)    Morbid obesity (HCC) 06/13/2015   Essential hypertension 06/13/2015   OSA (obstructive sleep apnea) 06/13/2015   Obesity hypoventilation syndrome (HCC) 06/13/2015   Chronic diastolic heart failure (HCC) 06/13/2015   PCP:  Rollene Almarie LABOR, MD Pharmacy:   [facility] [address]  [place], [place] - [address] [place] [address] Phone: [phone] Fax: [phone]  [id number] - [place], [place] - [address] AT [facility] [address] [place] [address] Phone: [phone] Fax: [phone]     Social Drivers of Health (SDOH) Social History: SDOH Screenings   Food Insecurity: No Food Insecurity (07/23/2024)  Housing: Unknown (07/23/2024)  Transportation Needs: No Transportation Needs (07/23/2024)  Utilities: Not At Risk (07/23/2024)  Depression (PHQ2-9): Low Risk (05/30/2024)  Financial Resource Strain: Low Risk (08/02/2023)   Received from [facility]  Physical Activity: Insufficiently Active (08/02/2023)   Received from [facility]  Social Connections: Socially Integrated (08/02/2023)   [facility]  Stress: No Stress Concern Present (10/23/2023)   [facility] from [facility]  Recent Concern: Stress - Stress Concern Present (08/02/2023)   Received from [facility]  Tobacco Use: Low Risk (07/23/2024)   SDOH Interventions: Food Insecurity Interventions: Intervention Not Indicated Housing Interventions: Intervention Not Indicated Transportation Interventions: Intervention Not Indicated Utilities Interventions: Intervention Not Indicated   Readmission Risk Interventions     No data to display

## 2024-07-25 NOTE — Procedures (Signed)
 Procedure  Trach change   Consent obtained verbally  Trach removed Site inspected  Lidocaine  jelly applied topically New 6 cuffless bivona portex inserted over obturator Placement verified via ETCO2  Pt tolerated well  Plan Will see him again in 3 months

## 2024-07-26 ENCOUNTER — Encounter (HOSPITAL_COMMUNITY): Payer: Self-pay | Admitting: Internal Medicine

## 2024-07-26 ENCOUNTER — Inpatient Hospital Stay (HOSPITAL_COMMUNITY)

## 2024-07-26 ENCOUNTER — Encounter (HOSPITAL_COMMUNITY)
Admission: EM | Disposition: A | Discharge status: Home Health | Payer: Self-pay | Source: Home / Self Care | Attending: Internal Medicine

## 2024-07-26 ENCOUNTER — Inpatient Hospital Stay (HOSPITAL_COMMUNITY): Admitting: Certified Registered Nurse Anesthetist

## 2024-07-26 DIAGNOSIS — I110 Hypertensive heart disease with heart failure: Secondary | ICD-10-CM | POA: Diagnosis not present

## 2024-07-26 DIAGNOSIS — I5022 Chronic systolic (congestive) heart failure: Secondary | ICD-10-CM | POA: Diagnosis not present

## 2024-07-26 DIAGNOSIS — L02612 Cutaneous abscess of left foot: Secondary | ICD-10-CM | POA: Diagnosis not present

## 2024-07-26 DIAGNOSIS — M869 Osteomyelitis, unspecified: Secondary | ICD-10-CM | POA: Diagnosis not present

## 2024-07-26 DIAGNOSIS — E1169 Type 2 diabetes mellitus with other specified complication: Secondary | ICD-10-CM | POA: Diagnosis not present

## 2024-07-26 HISTORY — PX: AMPUTATION: SHX166

## 2024-07-26 LAB — SURGICAL PCR SCREEN
MRSA, PCR: NEGATIVE
Staphylococcus aureus: NEGATIVE

## 2024-07-26 MED ORDER — CEFAZOLIN SODIUM-DEXTROSE 3-4 GM/150ML-% IV SOLN: 3.0000 g | INTRAVENOUS | Status: DC

## 2024-07-26 MED ORDER — CEFAZOLIN SODIUM-DEXTROSE 3-4 GM/150ML-% IV SOLN
INTRAVENOUS | Status: AC
  Filled 2024-07-26: qty 150

## 2024-07-26 MED ORDER — PROPOFOL 10 MG/ML IV BOLUS
INTRAVENOUS | Status: DC | PRN
  Administered 2024-07-26: 200 mg via INTRAVENOUS

## 2024-07-26 MED ORDER — LACTATED RINGERS IV SOLN: INTRAVENOUS | Status: DC | PRN

## 2024-07-26 MED ORDER — CHLORHEXIDINE GLUCONATE 4 % EX SOLN: 60.0000 mL | Freq: Once | CUTANEOUS | Status: DC

## 2024-07-26 MED ORDER — SODIUM CHLORIDE 0.9 % IR SOLN
Status: DC | PRN
  Administered 2024-07-26 (×2): 3000 mL

## 2024-07-26 MED ORDER — ONDANSETRON HCL 4 MG/2ML IJ SOLN
INTRAMUSCULAR | Status: DC | PRN
  Administered 2024-07-26: 4 mg via INTRAVENOUS

## 2024-07-26 MED ORDER — ORAL CARE MOUTH RINSE: 15.0000 mL | Freq: Once | OROMUCOSAL | Status: AC

## 2024-07-26 MED ORDER — FENTANYL CITRATE (PF) 100 MCG/2ML IJ SOLN
INTRAMUSCULAR | Status: AC
  Filled 2024-07-26: qty 2

## 2024-07-26 MED ORDER — CHLORHEXIDINE GLUCONATE 0.12 % MT SOLN
OROMUCOSAL | Status: AC
  Filled 2024-07-26: qty 15

## 2024-07-26 MED ORDER — CHLORHEXIDINE GLUCONATE 0.12 % MT SOLN
15.0000 mL | Freq: Once | OROMUCOSAL | Status: AC
  Administered 2024-07-26: 15 mL via OROMUCOSAL

## 2024-07-26 MED ORDER — HYDROMORPHONE HCL 1 MG/ML IJ SOLN
0.2500 mg | INTRAMUSCULAR | Status: DC | PRN
  Administered 2024-07-26: 0.5 mg via INTRAVENOUS

## 2024-07-26 MED ORDER — HYDROMORPHONE HCL 1 MG/ML IJ SOLN
INTRAMUSCULAR | Status: AC
  Filled 2024-07-26: qty 1

## 2024-07-26 MED ORDER — MIDAZOLAM HCL 2 MG/2ML IJ SOLN
INTRAMUSCULAR | Status: AC
  Filled 2024-07-26: qty 2

## 2024-07-26 MED ORDER — DEXAMETHASONE SOD PHOSPHATE PF 10 MG/ML IJ SOLN
INTRAMUSCULAR | Status: DC | PRN
  Administered 2024-07-26: 10 mg via INTRAVENOUS

## 2024-07-26 MED ORDER — VANCOMYCIN HCL 500 MG IV SOLR
INTRAVENOUS | Status: DC | PRN
  Administered 2024-07-26: 500 mg via TOPICAL

## 2024-07-26 MED ORDER — DEXTROSE 5 % IV SOLN
INTRAVENOUS | Status: DC | PRN
  Administered 2024-07-26: 3 g via INTRAVENOUS

## 2024-07-26 MED ORDER — OXYCODONE HCL 5 MG PO TABS: 5.0000 mg | ORAL_TABLET | ORAL | 0 refills | Status: AC | PRN

## 2024-07-26 MED ORDER — MIDAZOLAM HCL (PF) 2 MG/2ML IJ SOLN
INTRAMUSCULAR | Status: DC | PRN
  Administered 2024-07-26: 2 mg via INTRAVENOUS

## 2024-07-26 MED ORDER — VANCOMYCIN HCL 500 MG IV SOLR
INTRAVENOUS | Status: AC
  Filled 2024-07-26: qty 10

## 2024-07-26 MED ORDER — 0.9 % SODIUM CHLORIDE (POUR BTL) OPTIME
TOPICAL | Status: DC | PRN
  Administered 2024-07-26: 1000 mL

## 2024-07-26 MED ORDER — FENTANYL CITRATE (PF) 250 MCG/5ML IJ SOLN
INTRAMUSCULAR | Status: DC | PRN
  Administered 2024-07-26 (×2): 50 ug via INTRAVENOUS

## 2024-07-26 MED ORDER — LACTATED RINGERS IV SOLN: INTRAVENOUS | Status: DC

## 2024-07-26 MED ORDER — LIDOCAINE 2% (20 MG/ML) 5 ML SYRINGE
INTRAMUSCULAR | Status: DC | PRN
  Administered 2024-07-26: 60 mg via INTRAVENOUS

## 2024-07-26 MED ORDER — CLOTRIMAZOLE 1 % EX CREA
TOPICAL_CREAM | Freq: Two times a day (BID) | CUTANEOUS | Status: DC
  Filled 2024-07-26 (×2): qty 15

## 2024-07-26 NOTE — Transfer of Care (Signed)
 Immediate Anesthesia Transfer of Care Note  Patient: Ellaree DELENA Molt  Procedure(s) Performed: AMPUTATION, FOOT, RAY (Left: Foot)  Patient Location: PACU  Anesthesia Type:General  Level of Consciousness: awake, alert , oriented, and patient cooperative  Airway & Oxygen Therapy: Patient Spontanous Breathing  Post-op Assessment: Report given to RN, Post -op Vital signs reviewed and stable, Patient moving all extremities, and Patient moving all extremities X 4  Post vital signs: Reviewed and stable  Last Vitals:  Vitals Value Taken Time  BP 143/87 07/27/24 11:40  Temp    Pulse 65 07/27/24 11:40  Resp 12 07/27/24 11:40  SpO2 98 07/27/24 11:40    Last Pain:  Vitals:   07/26/24 0928  TempSrc: Oral  PainSc:          Complications: No notable events documented.

## 2024-07-26 NOTE — Progress Notes (Signed)
 Orthopedic Tech Progress Note Patient Details:  Derrick [name] 1981-03-23 [id number]  Ortho Devices Type of Ortho Device: Darco shoe Ortho Device/Splint Location: Left foot Ortho Device/Splint Interventions: Application   Post Interventions Patient Tolerated: Well  Massie BRAVO Derrick [name] 07/26/2024, 2:52 PM

## 2024-07-26 NOTE — Progress Notes (Signed)
 Pt not in room at this time for trach assessment by RT. Trach supplies left at bedside for trach change later today.

## 2024-07-26 NOTE — H&P (Signed)
 H&P Update:  -History and Physical Reviewed  -Patient has been re-examined  -No change in the plan of care  -The risks and benefits were presented and reviewed. The risks due to suture failure and/or irritation, recurrent/new/persistent infection, stiffness, nerve/vessel/tendon injury or rerupture of repaired tendon, nonunion/malunion, allograft usage, wound healing issues, development of arthritis, failure of this surgery, possibility of external fixation with delayed definitive surgery, need for further surgery, thromboembolic events, anesthesia/medical complications, amputation, death among others were discussed. The patient acknowledged the explanation, agreed to proceed with the plan and a consent was signed.  Derrick [name]

## 2024-07-26 NOTE — Progress Notes (Signed)
 " PROGRESS NOTE ELLWYN ERGLE  FMW:[id number] DOB: [birth year] DOA: 07/23/2024 PCP: [name], MD  Brief Narrative/Hospital Course: 43 y.o. male with medical history significant of OSA-s/p tracheotomy, HTN, chronic HFpEF, gout-who presented to the ED with left foot pain onset t Thursday  w/  pain/swelling and it got worse-on 12/22 when he woke up he noted there was a purulent discharge He was seen in ED given IV antibiotics sent home on Bactrim  and Keflex   but with no improvement In ED: CT left w/:Findings consistent with a subcutaneous abscess in the superolateral aspect of the forefoot, abutting and depressing the 5th extensor tendon, without CT evidence of acute osteomyelitis Orthopedics Dr. Beuford consulted advised bedside I&D, and antibiotics.  VSS- ;abs mild anemai wbc normal. US  left leg NEG for DVT on 12/22 S/p bedside I&d in ED> seen by ortho. MRI completed and resulted 12/26> Osteomyelitis of the fifth proximal phalanx and the fifth metatarsal head , s/c abscess measuring up to 4 x 2.4 x 2.1 cm with surrounding cellulitis. This abscess abuts the underlying traversing fifth extensor tendon.  Subjective: Seen and examined For OR this am Overnight afebrile BP stable on room air w/ tracheotomy Multiple family at the bedside for prayer before surgery  Assessment and plan:  Acute osteomyelitis of the 5th proximal phalanx and  MT  head  s/c abscess measuring up to 4 x 2.4 x 2.1 Left foot infection with superficial abscess/cellulitis: S/P bedside debridement in the ED. mrI resulted w/ Acute oseteo- Ortho following. MRSA scree negative. Continue Zyvox ,  Cont Unasyn  (12/26>>) ID has been consulted, orthopedic planning for amputation today.Extremities are well-perfused  Prediabetes: A1c normal.  Chronic HFpEF: Is euvolemic, continue home Bumex , metoprolol  and monitor electrolytes. Net IO Since Admission: 1,790.15 mL [07/26/24 1121]   Hypokalemia: Resolved.   Recheck in a.m.  HTN Well-controlled on home metoprolol  and Bumex    Gout Stable .Not  taking meds at home   OSA s/p chronic tracheotomy Stable, routine trach care.   Morbid Obesity w/ Body mass index is 45.78 kg/m.: Will benefit with PCP follow-up, weight loss,healthy lifestyle  DVT prophylaxis: Lovenox  Code Status:   Code Status: Full Code Family Communication: plan of care discussed with patient at bedside. Patient status is: Remains hospitalized because of severity of illness.  Level of care: Telemetry  Dispo: The patient is from:Home            Anticipated disposition:TBD Objective: Vitals last 24 hrs: Vitals:   07/25/24 2032 07/25/24 2135 07/26/24 0520 07/26/24 0824  BP: (!) 142/88  (!) 143/84 137/88  Pulse: 73 65 63 62  Resp: 18 20 18    Temp: 97.9 F (36.6 C)  98.7 F (37.1 C)   TempSrc:      SpO2: 100% 98% 100%   Weight:      Height:        Physical Examination: General exam: AAOX3. HEENT:Oral mucosa moist, Ear/Nose WNL, Trach site intact Respiratory system: Lungs bilaterally clear,  Cardiovascular system: S1 & S2 +, No JVD. Gastrointestinal system: Abdomen soft,NT,ND, BS+ Nervous System: Alert, awake, moving all extremities,and following commands. Extremities: extremities warm, left foot w/ wound erythema, tenderness and swelling.  Skin: Warm, no rashes MSK: Normal muscle bulk,tone, power   Medications reviewed:  Scheduled Meds:  bumetanide   0.5 mg Oral Daily    ceFAZolin  (ANCEF ) IV  3 g Intravenous To SS-Surg   cetirizine   5 mg Oral QPM   cyanocobalamin   1,000 mcg Oral Daily   enoxaparin  (LOVENOX )  injection  0.5 mg/kg Subcutaneous Q24H   gabapentin   900 mg Oral TID   lidocaine  (PF)  5 mL     linezolid   600 mg Oral Q12H   metoprolol  tartrate  50 mg Oral BID   pantoprazole   40 mg Oral Daily   povidone-iodine   2 Application Topical Once   Continuous Infusions:  ampicillin -sulbactam (UNASYN ) IV 3 g (07/26/24 0827)   Diet: Diet Order              Diet NPO time specified Except for: Sips with Meds  Diet effective now                  Data Reviewed:CBC: Recent Labs  Lab 07/21/24 1628 07/23/24 0332 07/23/24 1546 07/24/24 0230 07/25/24 0649  WBC 8.9 7.7 5.2 5.0 4.7  NEUTROABS 6.2  --   --   --   --   HGB 11.0* 11.3* 11.5* 10.6* 11.1*  HCT 34.0* 35.3* 36.4* 33.1* 35.2*  MCV 90.7 90.3 91.7 89.5 90.7  PLT 189 207 199 186 189   CMP: Recent Labs  Lab 07/21/24 1628 07/23/24 0332 07/23/24 1546 07/24/24 0230 07/25/24 0649  NA 141 141  --  140 141  K 3.3* 3.5  --  3.3* 3.7  CL 105 102  --  102 105  CO2 28 29  --  30 30  GLUCOSE 104* 97  --  92 91  BUN 15 10  --  11 10  CREATININE 1.06 1.23 1.24 1.22 1.19  CALCIUM  8.5* 8.8*  --  8.5* 8.7*   GFR: Estimated Creatinine Clearance: 112.9 mL/min (by C-G formula based on SCr of 1.19 mg/dL). Recent Labs  Lab 07/21/24 1628 07/24/24 0230  AST 25 21  ALT 20 21  ALKPHOS 96 89  BILITOT 0.4 0.3  PROT 6.7 6.4*  ALBUMIN 3.6 3.3*  No results for input(s): LIPASE, AMYLASE in the last 168 hours. No results for input(s): AMMONIA in the last 168 hours. Coagulation Profile: No results for input(s): INR, PROTIME in the last 168 hours. Unresulted Labs (From admission, onward)     Start     Ordered   07/30/24 0500  Creatinine, serum  (enoxaparin  (LOVENOX )    CrCl >/= 30 ml/min)  Weekly,   R     Comments: while on enoxaparin  therapy    07/23/24 1459   07/27/24 0500  Basic metabolic panel with GFR  Tomorrow morning,   R        07/26/24 0846   07/27/24 0500  CBC  Tomorrow morning,   R        07/26/24 0846   07/26/24 1104  Aerobic/Anaerobic Culture w Gram Stain (surgical/deep wound)  RELEASE UPON ORDERING,   TIMED       Comments: Specimen A: Specimen Description Left foot abscess 1 Phone [phone] Immunocompromised?  No  Antibiotic Treatment:  none Is the patient on airborne/droplet precautions? No Clinical History:  Left foot abscess Special Instructions:   none Specimen Disposition:  Microbiology     07/26/24 1104   07/26/24 1104  Aerobic/Anaerobic Culture w Gram Stain (surgical/deep wound)  RELEASE UPON ORDERING,   TIMED       Comments: Specimen B: Specimen Description Left foot abscess 2 Phone [phone] Immunocompromised?  No  Antibiotic Treatment:  none Is the patient on airborne/droplet precautions? No Clinical History:  Left foot abscess Special Instructions:  none Specimen Disposition:  Microbiology     07/26/24 1104  Antimicrobials/Microbiology: Anti-infectives (From admission, onward)    Start     Dose/Rate Route Frequency Ordered Stop   07/26/24 1108  vancomycin  (VANCOCIN ) powder          As needed 07/26/24 1109     07/26/24 1015  ceFAZolin  (ANCEF ) IVPB 3g/150 mL premix        3 g 300 mL/hr over 30 Minutes Intravenous On call to O.R. 07/26/24 0938 07/27/24 0559   07/26/24 0900  ceFAZolin  (ANCEF ) IVPB 3g/150 mL premix        3 g 300 mL/hr over 30 Minutes Intravenous To [facility] 07/25/24 2205 07/27/24 0900   07/25/24 1930  fluconazole  (DIFLUCAN ) tablet 150 mg        150 mg Oral  Once 07/25/24 1803 07/25/24 2034   07/25/24 1430  [MAR Hold]  Ampicillin -Sulbactam (UNASYN ) 3 g in sodium chloride  0.9 % 100 mL IVPB        (MAR Hold since Sat 07/26/2024 at 0916.Hold Reason: Transfer to a Procedural area)   3 g 200 mL/hr over 30 Minutes Intravenous Every 6 hours 07/25/24 1337     07/24/24 1115  [MAR Hold]  linezolid  (ZYVOX ) tablet 600 mg        (MAR Hold since Sat 07/26/2024 at 0916.Hold Reason: Transfer to a Procedural area)   600 mg Oral Every 12 hours 07/24/24 1018     07/23/24 1000  linezolid  (ZYVOX ) IVPB 600 mg  Status:  Discontinued        600 mg 300 mL/hr over 60 Minutes Intravenous Every 12 hours 07/23/24 0711 07/24/24 1018   07/23/24 0645  cefTRIAXone  (ROCEPHIN ) 2 g in sodium chloride  0.9 % 100 mL IVPB        2 g 200 mL/hr over 30 Minutes Intravenous  Once 07/23/24 0639 07/23/24 0723          Component Value Date/Time   SDES TRACHEAL SITE 02/13/2017 1330   SPECREQUEST NONE 02/13/2017 1330   CULT  02/13/2017 1330    FEW STAPHYLOCOCCUS AUREUS DIPHTHEROIDS(CORYNEBACTERIUM SPECIES) Standardized susceptibility testing for this organism is not available.    REPTSTATUS 02/19/2017 FINAL 02/13/2017 1330  Procedures:Procedures (LRB): AMPUTATION, FOOT, RAY (Left) Mennie LAMY, MD [facility] 07/26/2024, 11:24 AM   "

## 2024-07-26 NOTE — Progress Notes (Signed)
 Subjective:  Patient reports pain as appropriately controlled. Denies any new numbness/tingling.   Objective:   VITALS:  Temp:  [97.6 F (36.4 C)-98.7 F (37.1 C)] 97.8 F (36.6 C) (12/27 1905) Pulse Rate:  [58-72] 72 (12/27 1905) Resp:  [16-19] 18 (12/27 1905) BP: (137-157)/(83-97) 153/96 (12/27 1905) SpO2:  [88 %-100 %] 99 % (12/27 1905)  Left foot amputation site c/d/i Neurologically intact Neurovascular intact Sensation & motor grossly intact distally Intact pulses distally Compartment soft   LABS Recent Labs    07/24/24 0230 07/25/24 0649  HGB 10.6* 11.1*  WBC 5.0 4.7  PLT 186 189   Recent Labs    07/24/24 0230 07/25/24 0649  NA 140 141  K 3.3* 3.7  CL 102 105  CO2 30 30  BUN 11 10  CREATININE 1.22 1.19  GLUCOSE 92 91   No results for input(s): LABPT, INR in the last 72 hours.   Assessment/Plan: * Day of Surgery * Procedures (LRB): AMPUTATION, FOOT, RAY (Left)  -stable on RNF -trend intra-op cx, IV abx per primary team/ID -advise 6 wk of antibiotics given extensive burden of infection, young age and possibility of positive margins given preop MRI -strict heel WB operative extremity, maximum elevation -maintain daily dry dressings until follow up -pain rx printed and placed in chart -DVT ppx: Aspirin 81 mg twice daily -follow up as outpatient within 7-10 days for wound check -sutures out in 2-3 weeks in outpatient office  Derrick [name] 07/26/2024, 10:08 PM

## 2024-07-26 NOTE — Anesthesia Procedure Notes (Signed)
 Date/Time: 07/26/2024 10:24 AM  Performed by: Arvell Edsel HERO, CRNAPre-anesthesia Checklist: Patient identified, Emergency Drugs available, Suction available, Patient being monitored and Timeout performed Patient Re-evaluated:Patient Re-evaluated prior to induction Oxygen Delivery Method: Circle system utilized Preoxygenation: Pre-oxygenation with 100% oxygen Induction Type: IV induction and Tracheostomy Tube size: 6.0 mm Placement Confirmation: breath sounds checked- equal and bilateral and positive ETCO2 Tube secured with: Tape Dental Injury: Teeth and Oropharynx as per pre-operative assessment  Comments: 6.0 ETT placed in trach stoma

## 2024-07-26 NOTE — Anesthesia Preprocedure Evaluation (Signed)
"                                    Anesthesia Evaluation  Patient identified by MRN, date of birth, ID band Patient awake    Reviewed: Allergy & Precautions, H&P , NPO status , Patient's Chart, lab work & pertinent test results  Airway Mallampati: Trach   Neck ROM: Full    Dental no notable dental hx. (+) Teeth Intact, Dental Advisory Given   Pulmonary sleep apnea    Pulmonary exam normal breath sounds clear to auscultation       Cardiovascular hypertension, Pt. on medications and Pt. on home beta blockers +CHF   Rhythm:Regular Rate:Normal     Neuro/Psych negative neurological ROS  negative psych ROS   GI/Hepatic Neg liver ROS,GERD  Medicated,,  Endo/Other  diabetes  Class 3 obesity  Renal/GU negative Renal ROS  negative genitourinary   Musculoskeletal   Abdominal   Peds  Hematology negative hematology ROS (+)   Anesthesia Other Findings   Reproductive/Obstetrics negative OB ROS                              Anesthesia Physical Anesthesia Plan  ASA: 3  Anesthesia Plan: General   Post-op Pain Management: Ofirmev  IV (intra-op)*   Induction: Intravenous  PONV Risk Score and Plan: 3 and Ondansetron , Dexamethasone  and Midazolam   Airway Management Planned: Tracheostomy  Additional Equipment:   Intra-op Plan:   Post-operative Plan: Extubation in OR  Informed Consent: I have reviewed the patients History and Physical, chart, labs and discussed the procedure including the risks, benefits and alternatives for the proposed anesthesia with the patient or authorized representative who has indicated his/her understanding and acceptance.     Dental advisory given  Plan Discussed with: CRNA  Anesthesia Plan Comments:         Anesthesia Quick Evaluation  "

## 2024-07-26 NOTE — Discharge Instructions (Signed)
 Lillia Mountain, MD [facility]  Please read the following information regarding your care after surgery.  Medications   - Oxycodone  5 mg every 6 hours as needed for pain  We send above prescription to your pharmacy on file.  In addition you may also use: ? acetaminophen  (Tylenol ) 500 mg every 4-6 hours as you need for minor to moderate pain  Resume all other routine medications per usual or as directed by your PCP/other specialists.  ? To help prevent blood clots, you should also get up every hour while you are awake to move around.  Weight Bearing ? OK to HEEL weightbear in postop shoe on the operated leg or foot. This means do NOT touch the front of your surgical leg to the ground!  Cast / Splint / Dressing ? If you have a dressing, keep it clean and dry.  Dont put anything (coat hanger, pencil, etc) down inside of it.  Do a daily dressing change.  Swelling IMPORTANT: It is normal for you to have swelling where you had surgery. To reduce swelling and pain, keep at least 3 pillows under your leg so that your toes are above your nose and your heel is above the level of your hip.  It may be necessary to keep your foot or leg elevated for several weeks.  This is critical to helping your incisions heal and your pain to feel better.  Follow Up Call my office at [phone] when you are discharged from the hospital or surgery center to schedule an appointment to be seen 7-10 days after surgery.  Call my office at [phone] if you develop a fever >101.5 F, nausea, vomiting, bleeding from the surgical site or severe pain.

## 2024-07-26 NOTE — Anesthesia Postprocedure Evaluation (Signed)
"   Anesthesia Post Note  Patient: Derrick [name]  Procedure(s) Performed: AMPUTATION, FOOT, RAY (Left: Foot)     Patient location during evaluation: PACU Anesthesia Type: General Level of consciousness: awake and alert Pain management: pain level controlled Vital Signs Assessment: post-procedure vital signs reviewed and stable Respiratory status: spontaneous breathing, nonlabored ventilation, respiratory function stable and patient connected to nasal cannula oxygen Cardiovascular status: blood pressure returned to baseline and stable Postop Assessment: no apparent nausea or vomiting Anesthetic complications: no   No notable events documented.  Last Vitals:  Vitals:   07/26/24 1230 07/26/24 1245  BP: (!) 149/88 (!) 152/93  Pulse: 60 (!) 58  Resp: 16 16  Temp:  36.6 C  SpO2: 92% 97%    Last Pain:  Vitals:   07/26/24 1230  TempSrc:   PainSc: Asleep                 Otelia Hettinger,W. EDMOND      "

## 2024-07-26 NOTE — Op Note (Addendum)
 07/23/2024 - 07/26/2024  12:02 PM   PATIENT: Derrick [name]  43 y.o. male  MRN: [id number]   PRE-OPERATIVE DIAGNOSIS:   Left foot osteomyelitis with abscess and plantar ulceration   POST-OPERATIVE DIAGNOSIS:   Same   PROCEDURE: Left fifth ray amputation with irrigation and debridement   SURGEON:  Lillia Mountain, MD   ASSISTANT: None   ANESTHESIA: General, regional   EBL: Minimal   TOURNIQUET:    Total Tourniquet Time Documented: Thigh (Left) - 32 minutes Total: Thigh (Left) - 32 minutes    COMPLICATIONS: None apparent   DISPOSITION: Extubated, awake and stable to recovery.   INDICATION FOR PROCEDURE: The patient presented with above diagnosis.  We discussed the diagnosis, alternative treatment options, risks and benefits of the above surgical intervention, as well as alternative non-operative treatments. All questions/concerns were addressed and the patient/family demonstrated appropriate understanding of the diagnosis, the procedure, the postoperative course, and overall prognosis. The patient wished to proceed with surgical intervention and signed an informed surgical consent as such, in each others presence prior to surgery.   PROCEDURE IN DETAIL: After preoperative consent was obtained and the correct operative site was identified, the patient was brought to the operating room supine on stretcher. General anesthesia was induced. Preoperative antibiotics were administered. Surgical timeout was taken. The patient was then positioned supine. The operative lower extremity was prepped and draped in standard sterile fashion with a tourniquet around the thigh. The extremity was exsanguinated and the tourniquet was inflated to 275 mmHg.  We began by making a racket shaped incision over the 5th MTP. Dissection carried down to the level of the bone. Frank purulence readily encountered which was swabbed and sent for aerobe and anaerobe cultures. A saw blade  was used to perform ray amputation thru the 5th metatarsal shaft at the level determined by preop MRI. The bone cut was ensured to avoid any bony prominence. The resected toe and 5th metatarsal shaft bone biopsy were separately sent for pathology analysis.   The surgical sites were thoroughly irrigated with several liters of saline via cysto tubing. Irrisept solution applied as well as vancomycin  powder. The tourniquet was deflated and hemostasis achieved. The deep layers were closed using 2-0 PDS. The skin was closed without tension using 2-0 Prolene.   The leg was cleaned with saline and sterile dressings with gauze were applied. Well padded bulky dressings and wrap were applied. The patient was awakened from anesthesia and transported to the recovery room in stable condition.    FOLLOW UP PLAN: -transfer to PACU, then return to RNF -trend intra-op cx, IV abx per primary team/ID -advise 6 wk of antibiotics given extensive burden of infection, young age and possibility of positive margins given preop MRI -strict heel WB operative extremity, maximum elevation -maintain daily dry dressings until follow up -pain rx printed and placed in chart -DVT ppx: Aspirin 81 mg twice daily -follow up as outpatient within 7-10 days for wound check -sutures out in 2-3 weeks in outpatient office   RADIOGRAPHS: AP, lateral, oblique radiographs of the left foot were obtained intraoperatively. These showed interval 5th ray amputation. All joints were noted to be stable following procedure. No other acute injuries are noted.   Lillia Mountain Orthopaedic Surgery [facility]

## 2024-07-26 NOTE — Progress Notes (Signed)
 Subjective:  Patient reports pain as appropriately controlled. Denies any new numbness/tingling.   Objective:   VITALS:  Temp:  [97.8 F (36.6 C)-98.7 F (37.1 C)] 98.7 F (37.1 C) (12/27 0520) Pulse Rate:  [60-74] 63 (12/27 0520) Resp:  [17-20] 18 (12/27 0520) BP: (121-143)/(72-88) 143/84 (12/27 0520) SpO2:  [98 %-100 %] 100 % (12/27 0520)  Neurologically intact Neurovascular intact Plantar ulceration underlying left 5th met head and large abscess noted over this area with erythema and TTP Sensation & motor grossly intact distally Intact pulses distally Compartment soft   LABS Recent Labs    07/23/24 1546 07/24/24 0230 07/25/24 0649  HGB 11.5* 10.6* 11.1*  WBC 5.2 5.0 4.7  PLT 199 186 189   Recent Labs    07/24/24 0230 07/25/24 0649  NA 140 141  K 3.3* 3.7  CL 102 105  CO2 30 30  BUN 11 10  CREATININE 1.22 1.19  GLUCOSE 92 91   No results for input(s): LABPT, INR in the last 72 hours.   Assessment/Plan:   Procedures (LRB): AMPUTATION, FOOT, RAY (Left)  Discussed imaging, findings and overall clinical picture at length with the patient/family at bedside Plan is for left 5th ray amputation with I&D Due to MRI findings of osteomyelitis and plantar ulceration as well as rapid (1 wk) onset of severe infection, will plan on amputation for source control Patient is in agreement and ready to proceed NPO and held VTE ppx since MN Return to Hospitalist service postop Anticipate 6 wk abx due to extensive infection  Lillia Mountain 07/26/2024, 8:11 AM  The risks and benefits were presented and reviewed. The risks due to hardware failure/irritation, new/persistent/recurrent infection, stiffness, nerve/vessel/tendon injury, nonunion/malunion of any fracture, wound healing issues, allograft usage, development of arthritis, failure of this surgery, possibility of external fixation in certain situations, possibility of delayed definitive surgery, need for further  surgery, prolonged wound care including further soft tissue coverage procedures, thromboembolic events, anesthesia/medical complications/events perioperatively and beyond, amputation, death among others were discussed. The patient acknowledged the explanation, agreed to proceed with the plan and a consent was signed.

## 2024-07-27 ENCOUNTER — Encounter (HOSPITAL_COMMUNITY): Payer: Self-pay | Admitting: Orthopaedic Surgery

## 2024-07-27 DIAGNOSIS — L02612 Cutaneous abscess of left foot: Secondary | ICD-10-CM | POA: Diagnosis not present

## 2024-07-27 LAB — BASIC METABOLIC PANEL WITH GFR
Anion gap: 8 (ref 5–15)
BUN: 11 mg/dL (ref 6–20)
CO2: 26 mmol/L (ref 22–32)
Calcium: 8.9 mg/dL (ref 8.9–10.3)
Chloride: 105 mmol/L (ref 98–111)
Creatinine, Ser: 1.03 mg/dL (ref 0.61–1.24)
GFR, Estimated: >60 mL/min
Glucose, Bld: 122 mg/dL — ABNORMAL HIGH (ref 70–99)
Potassium: 4 mmol/L (ref 3.5–5.1)
Sodium: 138 mmol/L (ref 135–145)

## 2024-07-27 LAB — CBC
HCT: 34.5 % — ABNORMAL LOW (ref 39.0–52.0)
Hemoglobin: 11.2 g/dL — ABNORMAL LOW (ref 13.0–17.0)
MCH: 28.9 pg (ref 26.0–34.0)
MCHC: 32.5 g/dL (ref 30.0–36.0)
MCV: 89.1 fL (ref 80.0–100.0)
Platelets: 222 K/uL (ref 150–400)
RBC: 3.87 MIL/uL — ABNORMAL LOW (ref 4.22–5.81)
RDW: 13.2 % (ref 11.5–15.5)
WBC: 11.3 K/uL — ABNORMAL HIGH (ref 4.0–10.5)
nRBC: 0 % (ref 0.0–0.2)

## 2024-07-27 MED ORDER — LIDOCAINE HCL URETHRAL/MUCOSAL 2 % EX GEL
1.0000 | CUTANEOUS | Status: AC
  Filled 2024-07-27 (×2): qty 6

## 2024-07-27 MED ORDER — MORPHINE SULFATE (PF) 2 MG/ML IV SOLN
1.0000 mg | INTRAVENOUS | Status: DC | PRN
  Administered 2024-07-28 (×3): 1 mg via INTRAVENOUS

## 2024-07-27 NOTE — Plan of Care (Signed)
   Problem: Education: Goal: Knowledge of General Education information will improve Description Including pain rating scale, medication(s)/side effects and non-pharmacologic comfort measures Outcome: Progressing   Problem: Health Behavior/Discharge Planning: Goal: Ability to manage health-related needs will improve Outcome: Progressing

## 2024-07-27 NOTE — Progress Notes (Signed)
 Asked charge nurse to come in to see the drainage that had came through bandage and to get her opinion on whether we should be the one to change the dressing for the first time with this amount of drainage.  She advised to call ortho surgeon on call and leave message.  I have done so and gave the number to the front desk in case he returns call after shift change.

## 2024-07-27 NOTE — Progress Notes (Signed)
 " PROGRESS NOTE Derrick [name]  FMW:[id number] DOB: 1981/01/05 DOA: 07/23/2024 PCP: [name], MD  Brief Narrative/Hospital Course: 43 y.o. male with medical history significant of OSA-s/p tracheotomy, HTN, chronic HFpEF, gout-who presented to the ED with left foot pain onset t Thursday  w/  pain/swelling and it got worse-on 12/22 when he woke up he noted there was a purulent discharge He was seen in ED given IV antibiotics sent home on Bactrim  and Keflex   but with no improvement In ED: CT left w/:Findings consistent with a subcutaneous abscess in the superolateral aspect of the forefoot, abutting and depressing the 5th extensor tendon, without CT evidence of acute osteomyelitis Orthopedics Dr. Beuford consulted advised bedside I&D, and antibiotics.  VSS- ;abs mild anemai wbc normal. US  left leg NEG for DVT on 12/22 S/p bedside I&d in ED> seen by ortho. MRI completed and resulted 12/26> Osteomyelitis of the fifth proximal phalanx and the fifth metatarsal head , s/c abscess measuring up to 4 x 2.4 x 2.1 cm with surrounding cellulitis. This abscess abuts the underlying traversing fifth extensor tendon.  Subjective: Seen and examined Left foot dressing changed last night due to so soiling Oxy not working well Overnight remains afebrile BP stable on RA w/ tracheotomy, labs with stable electrolytes bump in CBC postop  Assessment and plan:  Acute osteomyelitis of the 5th proximal phalanx and  MT  head  s/c abscess measuring up to 4 x 2.4 x 2.1 Left foot infection with superficial abscess/cellulitis: S/P bedside debridement in the ED. mrI resulted w/ Acute oseteo- S/P amputation lefT foot ray,  IntraOp culture pending, ID following continue antibiotics for antibiotics> Ortho recommending 6 use of antibiotics given extensive burden of infection young age and possibility of positive margins. Continue Zyvox ,  Cont Unasyn  (12/26>>) Continue dressing change given drainage, orthopedic  recommending aspirin 81 twice daily for DVT prophylaxis follow-up outpatient in 7 to 10 days for wound check, maintain daily dry dressing until follow-up. Continue pain management with oral IV opiates and on opiates  Prediabetes: A1c normal.  Chronic HFpEF: Is euvolemic, continue home Bumex , metoprolol  and monitor electrolytes. Net IO Since Admission: 1,030.15 mL [07/27/24 1002]   Hypokalemia: Resolved.    HTN Well-controlled on home metoprolol  and Bumex    Gout Stable .Not  taking meds at home   OSA s/p chronic tracheotomy Stable, routine trach care.   Morbid Obesity w/ Body mass index is 45.78 kg/m.: Will benefit with PCP follow-up, weight loss,healthy lifestyle  DVT prophylaxis: Lovenox  Code Status:   Code Status: Full Code Family Communication: plan of care discussed with patient at bedside. Patient status is: Remains hospitalized because of severity of illness.  Level of care: Telemetry  Dispo: The patient is from:Home            Anticipated disposition: home soon  Objective: Vitals last 24 hrs: Vitals:   07/27/24 0503 07/27/24 0520 07/27/24 0832 07/27/24 0837  BP:  (!) 152/86 138/80 138/80  Pulse: 60 64  64  Resp: 16 18 17    Temp:  98.4 F (36.9 C)    TempSrc:      SpO2: 92% 95% 97%   Weight:      Height:        Physical Examination: General exam: AAOX3. HEENT:Oral mucosa moist, Ear/Nose WNL, Trach site intact Respiratory system: Lungs bilaterally clear,  Cardiovascular system: S1 & S2 +, No JVD. Gastrointestinal system: Abdomen soft,NT,ND, BS+ Nervous System: Alert, awake, moving all extremities,and following commands. Extremities: extremities warm, left  foot  w/ post op dressing- toes visible and sensation intact Skin: Warm, no rashes MSK: Normal muscle bulk,tone, power   Medications reviewed:  Scheduled Meds:  bumetanide   0.5 mg Oral Daily   cetirizine   5 mg Oral QPM   clotrimazole    Topical BID   cyanocobalamin   1,000 mcg Oral Daily    enoxaparin  (LOVENOX ) injection  0.5 mg/kg Subcutaneous Q24H   gabapentin   900 mg Oral TID   lidocaine  (PF)  5 mL     linezolid   600 mg Oral Q12H   metoprolol  tartrate  50 mg Oral BID   pantoprazole   40 mg Oral Daily   Continuous Infusions:  ampicillin -sulbactam (UNASYN ) IV 3 g (07/27/24 0836)   Diet: Diet Order             Diet Heart Room service appropriate? Yes; Fluid consistency: Thin; Fluid restriction: 1500 mL Fluid  Diet effective now                  Data Reviewed:CBC: Recent Labs  Lab 07/21/24 1628 07/23/24 0332 07/23/24 1546 07/24/24 0230 07/25/24 0649 07/27/24 0814  WBC 8.9 7.7 5.2 5.0 4.7 11.3*  NEUTROABS 6.2  --   --   --   --   --   HGB 11.0* 11.3* 11.5* 10.6* 11.1* 11.2*  HCT 34.0* 35.3* 36.4* 33.1* 35.2* 34.5*  MCV 90.7 90.3 91.7 89.5 90.7 89.1  PLT 189 207 199 186 189 222   CMP: Recent Labs  Lab 07/21/24 1628 07/23/24 0332 07/23/24 1546 07/24/24 0230 07/25/24 0649 07/27/24 0814  NA 141 141  --  140 141 138  K 3.3* 3.5  --  3.3* 3.7 4.0  CL 105 102  --  102 105 105  CO2 28 29  --  30 30 26   GLUCOSE 104* 97  --  92 91 122*  BUN 15 10  --  11 10 11   CREATININE 1.06 1.23 1.24 1.22 1.19 1.03  CALCIUM  8.5* 8.8*  --  8.5* 8.7* 8.9   GFR: Estimated Creatinine Clearance: 130.4 mL/min (by C-G formula based on SCr of 1.03 mg/dL). Recent Labs  Lab 07/21/24 1628 07/24/24 0230  AST 25 21  ALT 20 21  ALKPHOS 96 89  BILITOT 0.4 0.3  PROT 6.7 6.4*  ALBUMIN 3.6 3.3*  No results for input(s): LIPASE, AMYLASE in the last 168 hours. No results for input(s): AMMONIA in the last 168 hours. Coagulation Profile: No results for input(s): INR, PROTIME in the last 168 hours. Unresulted Labs (From admission, onward)     Start     Ordered   07/30/24 0500  Creatinine, serum  (enoxaparin  (LOVENOX )    CrCl >/= 30 ml/min)  Weekly,   R     Comments: while on enoxaparin  therapy    07/23/24 1459   07/28/24 0500  Basic metabolic panel with GFR  Daily,   R       07/27/24 1114   07/28/24 0500  CBC  Daily,   R      07/27/24 1114          Antimicrobials/Microbiology: Anti-infectives (From admission, onward)    Start     Dose/Rate Route Frequency Ordered Stop   07/26/24 1108  vancomycin  (VANCOCIN ) powder  Status:  Discontinued          As needed 07/26/24 1109 07/26/24 1131   07/26/24 1015  ceFAZolin  (ANCEF ) IVPB 3g/150 mL premix  Status:  Discontinued        3  g 300 mL/hr over 30 Minutes Intravenous On call to O.R. 07/26/24 0938 07/26/24 1303   07/26/24 0900  ceFAZolin  (ANCEF ) IVPB 3g/150 mL premix  Status:  Discontinued        3 g 300 mL/hr over 30 Minutes Intravenous To [facility]Stay [facility] 07/25/24 2205 07/26/24 1303   07/25/24 1930  fluconazole  (DIFLUCAN ) tablet 150 mg        150 mg Oral  Once 07/25/24 1803 07/25/24 2034   07/25/24 1430  Ampicillin -Sulbactam (UNASYN ) 3 g in sodium chloride  0.9 % 100 mL IVPB        3 g 200 mL/hr over 30 Minutes Intravenous Every 6 hours 07/25/24 1337     07/24/24 1115  linezolid  (ZYVOX ) tablet 600 mg        600 mg Oral Every 12 hours 07/24/24 1018     07/23/24 1000  linezolid  (ZYVOX ) IVPB 600 mg  Status:  Discontinued        600 mg 300 mL/hr over 60 Minutes Intravenous Every 12 hours 07/23/24 0711 07/24/24 1018   07/23/24 0645  cefTRIAXone  (ROCEPHIN ) 2 g in sodium chloride  0.9 % 100 mL IVPB        2 g 200 mL/hr over 30 Minutes Intravenous  Once 07/23/24 0639 07/23/24 0723         Component Value Date/Time   SDES TISSUE 07/26/2024 1103   SPECREQUEST LEFT FOOT ABSCESS 2 07/26/2024 1103   CULT  07/26/2024 1103    NO GROWTH < 24 HOURS Performed at [facility], [address] [place], [place] [address]    REPTSTATUS PENDING 07/26/2024 1103  Procedures:Procedures (LRB): AMPUTATION, FOOT, RAY (Left) Mennie LAMY, MD [facility] 07/27/2024, 11:59 AM   "

## 2024-07-27 NOTE — Evaluation (Signed)
 Occupational Therapy Evaluation Patient Details Name: Derrick [name] MRN: [id number] DOB: 22-Sep-1980 Today's Date: 07/27/2024   History of Present Illness   Pt is a 43 y.o male admitted 12/24 for LLE wound. MRI showing osteomyelitis of 5th phalanx and metatarsal head. S/p fifth ray amp and I&D 12/27. PMH: HTN, CHF, DM, cellulitis, morbid obesity, OSA s/p tracheotomy with PMV     Clinical Impressions Pt admitted based on above, and was seen based on problem list below. PTA pt was independent with ADLs and IADLs. Today pt is requiring set up  to min  assist for ADLs. Functional transfers are  CGA with RW. Pt limited by pain and decreased standing balance, but anticipate will progress well no follow up OT needs. Educated pt and mother on recommendation of shower chair at d/c, and additional purchasing information, pt verbalized understanding. OT will continue to follow acutely to maximize functional independence.     If plan is discharge home, recommend the following:   A little help with walking and/or transfers;A little help with bathing/dressing/bathroom;Assistance with cooking/housework;Assist for transportation;Help with stairs or ramp for entrance     Functional Status Assessment   Patient has had a recent decline in their functional status and demonstrates the ability to make significant improvements in function in a reasonable and predictable amount of time.     Equipment Recommendations   Tub/shower seat      Precautions/Restrictions   Precautions Precautions: Fall Recall of Precautions/Restrictions: Intact Required Braces or Orthoses: Other Brace Other Brace: Draco shoe Restrictions Weight Bearing Restrictions Per Provider Order: Yes LLE Weight Bearing Per Provider Order: Partial weight bearing LLE Partial Weight Bearing Percentage or Pounds: WB through heel only     Mobility Bed Mobility Overal bed mobility: Modified Independent     General bed  mobility comments: HOB elevated    Transfers Overall transfer level: Needs assistance Equipment used: Rolling walker (2 wheels) Transfers: Sit to/from Stand Sit to Stand: Contact guard assist           General transfer comment: CGA for balance. Pt did well with maintaing WB status      Balance Overall balance assessment: Needs assistance Sitting-balance support: No upper extremity supported, Feet supported Sitting balance-Leahy Scale: Normal     Standing balance support: Bilateral upper extremity supported, During functional activity, Reliant on assistive device for balance Standing balance-Leahy Scale: Fair Standing balance comment: Benefits from RW support         ADL either performed or assessed with clinical judgement   ADL Overall ADL's : Needs assistance/impaired Eating/Feeding: Set up;Sitting   Grooming: Set up;Sitting           Upper Body Dressing : Set up;Sitting   Lower Body Dressing: Sit to/from stand;Minimal assistance Lower Body Dressing Details (indicate cue type and reason): assist to don post-op shoe Toilet Transfer: Contact guard assist;Rolling walker (2 wheels);Ambulation   Toileting- Clothing Manipulation and Hygiene: Contact guard assist       Functional mobility during ADLs: Contact guard assist;Rolling walker (2 wheels) General ADL Comments: CGA for balance     Vision Baseline Vision/History: 0 No visual deficits Vision Assessment?: No apparent visual deficits            Pertinent Vitals/Pain Pain Assessment Pain Assessment: 0-10 Pain Score: 5  Pain Location: LLE Pain Descriptors / Indicators: Discomfort Pain Intervention(s): Monitored during session     Extremity/Trunk Assessment Upper Extremity Assessment Upper Extremity Assessment: Overall WFL for tasks assessed   Lower Extremity  Assessment Lower Extremity Assessment: Defer to PT evaluation   Cervical / Trunk Assessment Cervical / Trunk Assessment: Normal    Communication Communication Communication: No apparent difficulties   Cognition Arousal: Alert Behavior During Therapy: WFL for tasks assessed/performed Cognition: No apparent impairments     Following commands: Intact       Cueing  General Comments   Cueing Techniques: Verbal cues  Mother present for session and supportive           Home Living Family/patient expects to be discharged to:: Private residence Living Arrangements: Alone Available Help at Discharge: Family;Available 24 hours/day Type of Home: House Home Access: Stairs to enter [employer] of Steps: 3 Entrance Stairs-Rails: None Home Layout: One level     Bathroom Shower/Tub: Producer, Television/film/video: Handicapped height     Home Equipment: Rollator (4 wheels)          Prior Functioning/Environment Prior Level of Function : Independent/Modified Independent       Mobility Comments: ind no AD ADLs Comments: Ind    OT Problem List: Decreased activity tolerance;Impaired balance (sitting and/or standing);Decreased knowledge of precautions   OT Treatment/Interventions: Therapeutic exercise;Self-care/ADL training;Energy conservation;DME and/or AE instruction;Therapeutic activities;Patient/family education;Balance training      OT Goals(Current goals can be found in the care plan section)   Acute Rehab OT Goals Patient Stated Goal: To go home OT Goal Formulation: With patient Time For Goal Achievement: 08/10/24 Potential to Achieve Goals: Good   OT Frequency:  Min 2X/week       AM-PAC OT 6 Clicks Daily Activity     Outcome Measure Help from another person eating meals?: None Help from another person taking care of personal grooming?: A Little Help from another person toileting, which includes using toliet, bedpan, or urinal?: A Little Help from another person bathing (including washing, rinsing, drying)?: A Little Help from another person to put on and taking off  regular upper body clothing?: A Little Help from another person to put on and taking off regular lower body clothing?: A Little 6 Click Score: 19   End of Session Equipment Utilized During Treatment: Rolling walker (2 wheels) Nurse Communication: Mobility status  Activity Tolerance: Patient tolerated treatment well Patient left: in chair;with call bell/phone within reach;with family/visitor present  OT Visit Diagnosis: Unsteadiness on feet (R26.81);Other abnormalities of gait and mobility (R26.89)                Time: 1432-1450 OT Time Calculation (min): 18 min Charges:  OT General Charges $OT Visit: 1 Visit OT Evaluation $OT Eval Moderate Complexity: 1 Mod  Shaylin Blatt C, OT  Acute Rehabilitation Services Office [phone] Secure chat preferred   Adrianne GORMAN Savers 07/27/2024, 3:30 PM

## 2024-07-27 NOTE — Progress Notes (Signed)
" °   07/27/24 0832  Vitals  BP 138/80  BP Location Right Arm  BP Method Automatic  Patient Position (if appropriate) Lying  Pulse Rate Source Dinamap  Resp 17  Level of Consciousness  Level of Consciousness Alert  MEWS COLOR  MEWS Score Color Green  Oxygen Therapy  SpO2 97 %  O2 Device Room Air  Pain Assessment  Pain Scale 0-10  Pain Score 0  MEWS Score  MEWS Temp 0  MEWS Systolic 0  MEWS Pulse 0  MEWS RR 0  MEWS LOC 0  MEWS Score 0    "

## 2024-07-27 NOTE — Evaluation (Signed)
 Physical Therapy Evaluation Patient Details Name: Derrick [name] MRN: [id number] DOB: 1981-01-14 Today's Date: 07/27/2024  History of Present Illness  Pt is a 43 y.o male admitted 12/24 for LLE wound. MRI showing osteomyelitis of 5th phalanx and metatarsal head. S/p fifth ray amp and I&D 12/27. PMH: HTN, CHF, DM, cellulitis, morbid obesity, OSA s/p tracheotomy with PMV  Clinical Impression  Pt admitted with above diagnosis. Lives at home with family, in a single-level home with a few steps to enter; Prior to admission, pt was independent; Presents to PT with L foot weight bearing restrictions, L foot pain;  Even so, he was able to get up from recliner with CGA, and walk household distance with RW and CGA; Anticipate good progress; Pt currently with functional limitations due to the deficits listed below (see PT Problem List). Pt will benefit from skilled PT to increase their independence and safety with mobility to allow discharge to the venue listed below.           If plan is discharge home, recommend the following: A little help with walking and/or transfers;A little help with bathing/dressing/bathroom   Can travel by private vehicle        Equipment Recommendations Rolling walker (2 wheels);BSC/3in1  Recommendations for Other Services       Functional Status Assessment Patient has had a recent decline in their functional status and demonstrates the ability to make significant improvements in function in a reasonable and predictable amount of time.     Precautions / Restrictions Precautions Precautions: Fall Recall of Precautions/Restrictions: Intact Required Braces or Orthoses: Other Brace Other Brace: Draco shoe Restrictions Weight Bearing Restrictions Per Provider Order: Yes LLE Weight Bearing Per Provider Order: Partial weight bearing LLE Partial Weight Bearing Percentage or Pounds: WB through heel only      Mobility  Bed Mobility                     Transfers Overall transfer level: Needs assistance Equipment used: Rolling walker (2 wheels) Transfers: Sit to/from Stand Sit to Stand: Contact guard assist           General transfer comment: CGA for balance. Pt did well with maintaing WB status    Ambulation/Gait Ambulation/Gait assistance: Contact guard assist Gait Distance (Feet): 350 Feet Assistive device: Rolling walker (2 wheels) Gait Pattern/deviations: Step-through pattern       General Gait Details: Managing keeping weight on L heel in stance well; walked a few feet in teh room without the RW  Stairs            Wheelchair Mobility     Tilt Bed    Modified Rankin (Stroke Patients Only)       Balance Overall balance assessment: Needs assistance Sitting-balance support: No upper extremity supported, Feet supported Sitting balance-Leahy Scale: Normal     Standing balance support: Bilateral upper extremity supported, During functional activity, Reliant on assistive device for balance Standing balance-Leahy Scale: Fair Standing balance comment: Benefits from RW support                             Pertinent Vitals/Pain Pain Assessment Pain Assessment: Faces Faces Pain Scale: Hurts a little bit Pain Location: LLE Pain Descriptors / Indicators: Discomfort Pain Intervention(s): Monitored during session    Home Living Family/patient expects to be discharged to:: Private residence Living Arrangements: Alone Available Help at Discharge: Family;Available 24 hours/day Type of Home: House Home  Access: Stairs to enter Entrance Stairs-Rails: None Entrance Stairs-Number of Steps: 3   Home Layout: One level Home Equipment: Rollator (4 wheels)      Prior Function Prior Level of Function : Independent/Modified Independent             Mobility Comments: ind no AD ADLs Comments: Ind     Extremity/Trunk Assessment   Upper Extremity Assessment Upper Extremity Assessment: Defer to  OT evaluation    Lower Extremity Assessment Lower Extremity Assessment: LLE deficits/detail LLE Deficits / Details: L foot dressed and wrapped; assist to donn Darco shoe    Cervical / Trunk Assessment Cervical / Trunk Assessment: Normal  Communication   Communication Communication: No apparent difficulties    Cognition Arousal: Alert Behavior During Therapy: WFL for tasks assessed/performed                             Following commands: Intact       Cueing Cueing Techniques: Verbal cues     General Comments General comments (skin integrity, edema, etc.): Mother present and suportive    Exercises     Assessment/Plan    PT Assessment Patient needs continued PT services  PT Problem List Decreased strength;Decreased range of motion;Decreased activity tolerance;Decreased balance;Decreased mobility;Decreased coordination;Decreased knowledge of use of DME;Decreased knowledge of precautions;Pain       PT Treatment Interventions DME instruction;Gait training;Stair training;Functional mobility training;Therapeutic activities;Therapeutic exercise;Balance training;Patient/family education;Manual techniques    PT Goals (Current goals can be found in the Care Plan section)  Acute Rehab PT Goals Patient Stated Goal: Home soon PT Goal Formulation: With patient Time For Goal Achievement: 08/10/24 Potential to Achieve Goals: Good    Frequency Min 2X/week     Co-evaluation               AM-PAC PT 6 Clicks Mobility  Outcome Measure Help needed turning from your back to your side while in a flat bed without using bedrails?: None Help needed moving from lying on your back to sitting on the side of a flat bed without using bedrails?: None Help needed moving to and from a bed to a chair (including a wheelchair)?: A Little Help needed standing up from a chair using your arms (e.g., wheelchair or bedside chair)?: A Little Help needed to walk in hospital room?: A  Little Help needed climbing 3-5 steps with a railing? : A Lot 6 Click Score: 19    End of Session   Activity Tolerance: Patient tolerated treatment well Patient left: in chair;with call bell/phone within reach;with family/visitor present Nurse Communication: Mobility status PT Visit Diagnosis: Unsteadiness on feet (R26.81);Other abnormalities of gait and mobility (R26.89);Pain Pain - Right/Left: Left Pain - part of body:  (Foot)    Time: 8444-8381 PT Time Calculation (min) (ACUTE ONLY): 23 min   Charges:   PT Evaluation $PT Eval Low Complexity: 1 Low PT Treatments $Gait Training: 8-22 mins PT General Charges $$ ACUTE PT VISIT: 1 Visit         Silvano Currier, PT  Acute Rehabilitation Services Office [phone] Secure Chat welcomed   Silvano VEAR Currier 07/27/2024, 6:35 PM

## 2024-07-28 ENCOUNTER — Ambulatory Visit: Admitting: Podiatry

## 2024-07-28 DIAGNOSIS — G629 Polyneuropathy, unspecified: Secondary | ICD-10-CM | POA: Diagnosis not present

## 2024-07-28 DIAGNOSIS — L02612 Cutaneous abscess of left foot: Secondary | ICD-10-CM | POA: Diagnosis not present

## 2024-07-28 DIAGNOSIS — L089 Local infection of the skin and subcutaneous tissue, unspecified: Secondary | ICD-10-CM

## 2024-07-28 DIAGNOSIS — Z794 Long term (current) use of insulin: Secondary | ICD-10-CM

## 2024-07-28 DIAGNOSIS — E11628 Type 2 diabetes mellitus with other skin complications: Secondary | ICD-10-CM | POA: Diagnosis not present

## 2024-07-28 DIAGNOSIS — M86172 Other acute osteomyelitis, left ankle and foot: Secondary | ICD-10-CM

## 2024-07-28 DIAGNOSIS — Z89422 Acquired absence of other left toe(s): Secondary | ICD-10-CM | POA: Diagnosis not present

## 2024-07-28 DIAGNOSIS — Z91199 Patient's noncompliance with other medical treatment and regimen due to unspecified reason: Secondary | ICD-10-CM

## 2024-07-28 DIAGNOSIS — M869 Osteomyelitis, unspecified: Secondary | ICD-10-CM

## 2024-07-28 LAB — CBC
HCT: 30.8 % — ABNORMAL LOW (ref 39.0–52.0)
Hemoglobin: 10.1 g/dL — ABNORMAL LOW (ref 13.0–17.0)
MCH: 29.5 pg (ref 26.0–34.0)
MCHC: 32.8 g/dL (ref 30.0–36.0)
MCV: 90.1 fL (ref 80.0–100.0)
Platelets: 177 K/uL (ref 150–400)
RBC: 3.42 MIL/uL — ABNORMAL LOW (ref 4.22–5.81)
RDW: 13.6 % (ref 11.5–15.5)
WBC: 7.7 K/uL (ref 4.0–10.5)
nRBC: 0 % (ref 0.0–0.2)

## 2024-07-28 LAB — BASIC METABOLIC PANEL WITH GFR
Anion gap: 8 (ref 5–15)
BUN: 11 mg/dL (ref 6–20)
CO2: 28 mmol/L (ref 22–32)
Calcium: 8.9 mg/dL (ref 8.9–10.3)
Chloride: 105 mmol/L (ref 98–111)
Creatinine, Ser: 1.17 mg/dL (ref 0.61–1.24)
GFR, Estimated: >60 mL/min
Glucose, Bld: 99 mg/dL (ref 70–99)
Potassium: 3.7 mmol/L (ref 3.5–5.1)
Sodium: 141 mmol/L (ref 135–145)

## 2024-07-28 NOTE — TOC Progression Note (Signed)
 Transition of Care (TOC) - Progression Note   Received order for [facility] daily dry dressings but can change more often if needed   Discussed with patient. HHRN will not be able to visit daily. He will need to be able to do wound care or have a support person assist.   Patient states his mother can help and he has some nurse friends who will assist.   Amy with [facility] accepted for [facility] and HHPT . Bedside nurse will provide patient and mother wound education prior to discharge and some dressing supplies.  Patient Details  Name: Derrick [name] MRN: [id number] Date of Birth: 09-14-1980  Transition of Care [facility]) CM/SW Contact  Demetric Parslow, Powell Jansky, RN Phone Number: 07/28/2024, 12:56 PM  Clinical Narrative:       Expected Discharge Plan: Home/Self Care Barriers to Discharge: Continued Medical Work up               Expected Discharge Plan and Services In-house Referral: NA Discharge Planning Services: CM Consult Post Acute Care Choice: NA Living arrangements for the past 2 months: Single Family Home                 DME Arranged: N/A DME Agency: NA       HH Arranged: NA HH Agency: NA         Social Drivers of Health (SDOH) Interventions SDOH Screenings   Food Insecurity: No Food Insecurity (07/23/2024)  Housing: Unknown (07/23/2024)  Transportation Needs: No Transportation Needs (07/23/2024)  Utilities: Not At Risk (07/23/2024)  Depression (PHQ2-9): Low Risk (05/30/2024)  Financial Resource Strain: Low Risk (08/02/2023)   Received from [facility]  Physical Activity: Insufficiently Active (08/02/2023)   Received from [facility]  Social Connections: Socially Integrated (08/02/2023)   Received from [facility]  Stress: No Stress Concern Present (10/23/2023)   Received from [facility]  Recent Concern: Stress - Stress Concern Present (08/02/2023)   Received from [facility]  Tobacco Use: Low Risk (07/26/2024)    Readmission Risk Interventions     No  data to display

## 2024-07-28 NOTE — Progress Notes (Signed)
 " PROGRESS NOTE Derrick [name]  FMW:[id number] DOB: 04-19-1981 DOA: 07/23/2024 PCP: [name], MD  Brief Narrative/Hospital Course: 43 y.o. male with medical history significant of OSA-s/p tracheotomy, HTN, chronic HFpEF, gout-who presented to the ED with left foot pain onset t Thursday  w/  pain/swelling and it got worse-on 12/22 when he woke up he noted there was a purulent discharge He was seen in ED given IV antibiotics sent home on Bactrim  and Keflex   but with no improvement In ED: CT left w/:Findings consistent with a subcutaneous abscess in the superolateral aspect of the forefoot, abutting and depressing the 5th extensor tendon, without CT evidence of acute osteomyelitis Orthopedics Dr. Beuford consulted advised bedside I&D, and antibiotics.  VSS- ;abs mild anemai wbc normal. US  left leg NEG for DVT on 12/22 S/p bedside I&d in ED> seen by ortho. MRI completed and resulted 12/26> Osteomyelitis of the fifth proximal phalanx and the fifth metatarsal head , s/c abscess measuring up to 4 x 2.4 x 2.1 cm with surrounding cellulitis. This abscess abuts the underlying traversing fifth extensor tendon. Underwent left foot first ray amputation and postop doing well with dressing intact Plan for antibiotics x 6 weeks as per Ortho and ID  Subjective: Seen and examined Able to mobilize with boot, reports a lot of bleeding and dressing was changed this morning May have difficulty dressing care at home seeing [facility]   Assessment and plan:  Acute osteomyelitis of the 5th proximal phalanx and  MT  head  s/c abscess measuring up to 4 x 2.4 x 2.1 Left foot infection with superficial abscess/cellulitis: S/P bedside debridement in the ED. mrI resulted w/ Acute oseteo- S/P amputation lefT foot ray,  IntraOp culture Gram stain gram-positive cocci culture pending -ID following ID on board.Ortho recommending 6 WK antibiotics given extensive burden of infection young age and possibility of  positive margins. Continue Zyvox ,  Cont Unasyn  (12/26>>).  Continue dressing change daily and prn Per ortho cont aspirin 81 twice daily for DVT prophylaxis and f/u  in 7 -0 days for wound check Continue pain management with oral IV opiates and on opiates  Prediabetes: A1c normal.  Chronic HFpEF: Is euvolemic, continue home Bumex , metoprolol  and monitor electrolytes. Net IO Since Admission: 1,630.15 mL [07/28/24 1256]   Hypokalemia: Resolved.    HTN Stable on metoprolol  and Bumex    Gout Stable .Not  taking meds at home   OSA s/p chronic tracheotomy Stable, routine trach care.   Morbid Obesity w/ Body mass index is 45.78 kg/m.: Will benefit with PCP follow-up, weight loss,healthy lifestyle  DVT prophylaxis: Lovenox  Code Status:   Code Status: Full Code Family Communication: plan of care discussed with patient at bedside. Patient status is: Remains hospitalized because of severity of illness.  Level of care: Telemetry  Dispo: The patient is from:Home            Anticipated disposition: home likely tomorrow  Objective: Vitals last 24 hrs: Vitals:   07/28/24 0634 07/28/24 0903 07/28/24 1004 07/28/24 1120  BP: 139/78  (!) 156/92   Pulse: (!) 57 62 66   Resp: 15 16 16    Temp: 98.3 F (36.8 C)  97.6 F (36.4 C)   TempSrc: Oral  Oral   SpO2: 99% 98% 97% 98%  Weight:      Height:        Physical Examination: General exam: AAOX3.obese HEENT:Oral mucosa moist, Ear/Nose WNL, Trach site intact Respiratory system: Lungs bilaterally clear,  Cardiovascular system: S1 &  S2 +, No JVD. Gastrointestinal system: Abdomen soft,NT,ND, BS+ Nervous System: Alert, awake, moving all extremities,and following commands. Extremities: extremities warm, left foot  w/ dressing intact Skin: Warm, no rashes MSK: Normal muscle bulk,tone, power   Medications reviewed:  Scheduled Meds:  bumetanide   0.5 mg Oral Daily   cetirizine   5 mg Oral QPM   clotrimazole    Topical BID    cyanocobalamin   1,000 mcg Oral Daily   enoxaparin  (LOVENOX ) injection  0.5 mg/kg Subcutaneous Q24H   gabapentin   900 mg Oral TID   lidocaine  (PF)  5 mL     lidocaine   1 Application Topical STAT   linezolid   600 mg Oral Q12H   metoprolol  tartrate  50 mg Oral BID   pantoprazole   40 mg Oral Daily   Continuous Infusions:  ampicillin -sulbactam (UNASYN ) IV 3 g (07/28/24 1008)   Diet: Diet Order             Diet Heart Room service appropriate? Yes; Fluid consistency: Thin; Fluid restriction: 1500 mL Fluid  Diet effective now                  Data Reviewed:CBC: Recent Labs  Lab 07/21/24 1628 07/23/24 0332 07/23/24 1546 07/24/24 0230 07/25/24 0649 07/27/24 0814 07/28/24 0603  WBC 8.9   < > 5.2 5.0 4.7 11.3* 7.7  NEUTROABS 6.2  --   --   --   --   --   --   HGB 11.0*   < > 11.5* 10.6* 11.1* 11.2* 10.1*  HCT 34.0*   < > 36.4* 33.1* 35.2* 34.5* 30.8*  MCV 90.7   < > 91.7 89.5 90.7 89.1 90.1  PLT 189   < > 199 186 189 222 177   < > = values in this interval not displayed.   CMP: Recent Labs  Lab 07/23/24 0332 07/23/24 1546 07/24/24 0230 07/25/24 0649 07/27/24 0814 07/28/24 0603  NA 141  --  140 141 138 141  K 3.5  --  3.3* 3.7 4.0 3.7  CL 102  --  102 105 105 105  CO2 29  --  30 30 26 28   GLUCOSE 97  --  92 91 122* 99  BUN 10  --  11 10 11 11   CREATININE 1.23 1.24 1.22 1.19 1.03 1.17  CALCIUM  8.8*  --  8.5* 8.7* 8.9 8.9   GFR: Estimated Creatinine Clearance: 114.8 mL/min (by C-G formula based on SCr of 1.17 mg/dL). Recent Labs  Lab 07/21/24 1628 07/24/24 0230  AST 25 21  ALT 20 21  ALKPHOS 96 89  BILITOT 0.4 0.3  PROT 6.7 6.4*  ALBUMIN 3.6 3.3*  No results for input(s): LIPASE, AMYLASE in the last 168 hours. No results for input(s): AMMONIA in the last 168 hours. Coagulation Profile: No results for input(s): INR, PROTIME in the last 168 hours. Unresulted Labs (From admission, onward)     Start     Ordered   07/30/24 0500  Creatinine, serum   (enoxaparin  (LOVENOX )    CrCl >/= 30 ml/min)  Weekly,   R     Comments: while on enoxaparin  therapy    07/23/24 1459   07/28/24 0500  Basic metabolic panel with GFR  Daily,   R      07/27/24 1114   07/28/24 0500  CBC  Daily,   R      07/27/24 1114          Antimicrobials/Microbiology: Anti-infectives (From admission, onward)  Start     Dose/Rate Route Frequency Ordered Stop   07/26/24 1108  vancomycin  (VANCOCIN ) powder  Status:  Discontinued          As needed 07/26/24 1109 07/26/24 1131   07/26/24 1015  ceFAZolin  (ANCEF ) IVPB 3g/150 mL premix  Status:  Discontinued        3 g 300 mL/hr over 30 Minutes Intravenous On call to O.R. 07/26/24 0938 07/26/24 1303   07/26/24 0900  ceFAZolin  (ANCEF ) IVPB 3g/150 mL premix  Status:  Discontinued        3 g 300 mL/hr over 30 Minutes Intravenous To [facility]Stay [facility] 07/25/24 2205 07/26/24 1303   07/25/24 1930  fluconazole  (DIFLUCAN ) tablet 150 mg        150 mg Oral  Once 07/25/24 1803 07/25/24 2034   07/25/24 1430  Ampicillin -Sulbactam (UNASYN ) 3 g in sodium chloride  0.9 % 100 mL IVPB        3 g 200 mL/hr over 30 Minutes Intravenous Every 6 hours 07/25/24 1337     07/24/24 1115  linezolid  (ZYVOX ) tablet 600 mg        600 mg Oral Every 12 hours 07/24/24 1018     07/23/24 1000  linezolid  (ZYVOX ) IVPB 600 mg  Status:  Discontinued        600 mg 300 mL/hr over 60 Minutes Intravenous Every 12 hours 07/23/24 0711 07/24/24 1018   07/23/24 0645  cefTRIAXone  (ROCEPHIN ) 2 g in sodium chloride  0.9 % 100 mL IVPB        2 g 200 mL/hr over 30 Minutes Intravenous  Once 07/23/24 0639 07/23/24 0723         Component Value Date/Time   SDES TISSUE 07/26/2024 1103   SPECREQUEST LEFT FOOT ABSCESS 2 07/26/2024 1103   CULT  07/26/2024 1103    MODERATE GROUP B STREP(S.AGALACTIAE)ISOLATED TESTING AGAINST S. AGALACTIAE NOT ROUTINELY PERFORMED DUE TO PREDICTABILITY OF AMP/PEN/VAN SUSCEPTIBILITY. Performed at [facility], [address]  [place], [place] [address]    REPTSTATUS PENDING 07/26/2024 1103  Procedures:Procedures (LRB): AMPUTATION, FOOT, RAY (Left) Mennie LAMY, MD [facility] 07/28/2024, 12:56 PM   "

## 2024-07-28 NOTE — Plan of Care (Signed)

## 2024-07-28 NOTE — Plan of Care (Signed)
   Problem: Education: Goal: Knowledge of General Education information will improve Description: Including pain rating scale, medication(s)/side effects and non-pharmacologic comfort measures Outcome: Progressing   Problem: Activity: Goal: Risk for activity intolerance will decrease Outcome: Progressing

## 2024-07-28 NOTE — Progress Notes (Signed)
 No show

## 2024-07-28 NOTE — Progress Notes (Signed)
 Mobility Specialist Progress Note:    07/28/24 1027  Mobility  Activity Ambulated with assistance (In hallway)  Level of Assistance Contact guard assist, steadying assist  Assistive Device Front wheel walker  Distance Ambulated (ft) 140 ft  LLE Weight Bearing Per Provider Order PWB  LLE Partial Weight Bearing Percentage or Pounds Through heel only  Activity Response Tolerated well  Mobility Referral Yes  Mobility visit 1 Mobility  Mobility Specialist Start Time (ACUTE ONLY) 1012  Mobility Specialist Stop Time (ACUTE ONLY) 1027  Mobility Specialist Time Calculation (min) (ACUTE ONLY) 15 min   Received pt in bed and agreeable to mobility. Pt required MinG for safety. Pt c/o LLE pain, otherwise tolerated well. Returned to room without fault. Left pt in bed with personal belongings and call light within reach. All needs met.  Derrick [name] Mobility Specialist  Please contact via [employer] or  Rehab Office [phone]

## 2024-07-28 NOTE — Consult Note (Signed)
 "        [facility]    Date of Admission:  07/23/2024     Total days of antibiotics 5   Linezolid  12/24 >>   Unasyn  12/26 >>              Reason for Consult: Neuropathic Ulcer, amputation, concern for residual OM   Referring Provider: [facility] Primary Care Provider: Rollene Almarie LABOR, MD   Assessment: Derrick [name] is a 43 y.o. male admitted for management of neuropathic foot ulcer and acute osteomyelitis of the left 5th ray requiring amputation on 12/27.   Left 5th Ray Osteomyelitis, partial amputation -  Cultures pending x 2 - should have an update soon. Gram positive organisms on stain.  Continue broad spectrum antibiotics for now - hopeful we can make adjustments for prolonged oral treatment outpatient.  Baseline CPR / ESR in AM  Heel weight bearing noted - he would benefit from additional reinforcement of what his restrictions are  FU pending pathology   Neuropathy -  Not diabetic. Discussed this is his main risk factor given no protective sensation. He has plans to work with podiatry soon. We discussed meticulous foot assessments daily.   Plan: Continue broad spectrum abx Follow micro updates in AM for recs  Baseline CPR / ESR in AM  Heel weight bearing noted - he would benefit from additional reinforcement of what his restrictions are   I have him scheduled to follow up with me next week to adjust if needed  Anticipate PO regimen and discharge in AM  FU pathology  May benefit from lymphedema referral in left leg to manage edema   Principal Problem:   Foot abscess, left Active Problems:   Morbid obesity (HCC)   Essential hypertension   Chronic diastolic heart failure (HCC)    bumetanide   0.5 mg Oral Daily   cetirizine   5 mg Oral QPM   clotrimazole    Topical BID   cyanocobalamin   1,000 mcg Oral Daily   enoxaparin  (LOVENOX ) injection  0.5 mg/kg Subcutaneous Q24H   gabapentin   900 mg Oral TID   lidocaine  (PF)  5 mL      lidocaine   1 Application Topical STAT   linezolid   600 mg Oral Q12H   metoprolol  tartrate  50 mg Oral BID   pantoprazole   40 mg Oral Daily    HPI: Derrick [name] is a 43 y.o. male   Went to OR on 12/27 with Dr. Barton for partial left 5th ray amputation.  Struggling with left leg swelling since August of this year with referral for vein and vascular team assessment.   Concern for cellulitis of the left leg at the end of October, then again in December with visit to ER on 12/22 (started 12/18) and again on 12/24. This started from a blister from new boots that he wore. Started on topical antibiotic cream but had increasing redness area noted. He is insensate at baseline and struggles with neuropathy. Was rx'd cephalexin  and trimethoprim  sulfa  for 7d but he re-presented 48h later for wound check as he had development of blisters and exam concerning for abscess around pinky toe with discoloration concerning for deep infection.  MRI obtained revealing osteomyelitis of the 5th proximal phalanx and metarsal head with overlying dorsal abscess masuring 4 x 2.4 x 2.1 cm with cellulitis. Abscess abuts tendon with focal irregularity along the plantar aspect of the forefoot.  Taken to OR for partial ray amputation on 12/27 with  bone sent for path and micro. There is concern for residual infection on margin and prolonged course of antibiotics likely needed.   He has done well with the antibiotics he has used thusfar. No trouble with side effects. Wants to discuss further cause of why this infection happened.  Lives at home with family and they need some time to ready his home environment - hopeful to discharge in AM    Review of Systems: Review of Systems  Constitutional:  Negative for chills, diaphoresis and fever.  Respiratory: Negative.    Cardiovascular:  Positive for leg swelling.  Genitourinary: Negative.   Musculoskeletal: Negative.   Neurological:  Positive for sensory change  (insensate).    Past Medical History:  Diagnosis Date   Acute on chronic diastolic CHF (congestive heart failure), NYHA class 1 (HCC)    Cellulitis    Diabetes mellitus without complication (HCC)    GERD (gastroesophageal reflux disease)    Hypertension    Hypoxemia    Morbid obesity (HCC)    Obesity hypoventilation syndrome (HCC)    OSA (obstructive sleep apnea)    Reflux     Social History[1]  Family History  Problem Relation Age of Onset   Diabetes Maternal Grandmother    Hypertension Maternal Grandmother    Asthma Other    Cancer Other    Stroke Other    Heart disease Other    Allergies[2]  OBJECTIVE: Blood pressure (!) 156/92, pulse 66, temperature 97.6 F (36.4 C), temperature source Oral, resp. rate 16, height 5' 9 (1.753 m), weight (!) 140.6 kg, SpO2 97%.  Physical Exam  Lab Results Lab Results  Component Value Date   WBC 7.7 07/28/2024   HGB 10.1 (L) 07/28/2024   HCT 30.8 (L) 07/28/2024   MCV 90.1 07/28/2024   PLT 177 07/28/2024    Lab Results  Component Value Date   CREATININE 1.17 07/28/2024   BUN 11 07/28/2024   NA 141 07/28/2024   K 3.7 07/28/2024   CL 105 07/28/2024   CO2 28 07/28/2024    Lab Results  Component Value Date   ALT 21 07/24/2024   AST 21 07/24/2024   ALKPHOS 89 07/24/2024   BILITOT 0.3 07/24/2024     Microbiology: Recent Results (from the past 240 hours)  Surgical pcr screen     Status: None   Collection Time: 07/25/24  7:54 PM   Specimen: Nasal Mucosa; Nasal Swab  Result Value Ref Range Status   MRSA, PCR NEGATIVE NEGATIVE Final   Staphylococcus aureus NEGATIVE NEGATIVE Final    Comment: (NOTE) The Xpert SA Assay (FDA approved for NASAL specimens in patients 31 years of age and older), is one component of a comprehensive surveillance program. It is not intended to diagnose infection nor to guide or monitor treatment. Performed at [facility], [address] [place], [place] [address]   Aerobic/Anaerobic  Culture w Gram Stain (surgical/deep wound)     Status: None (Preliminary result)   Collection Time: 07/26/24 11:00 AM   Specimen: Path Tissue  Result Value Ref Range Status   Specimen Description TISSUE  Final   Special Requests LEFT FOOT ABSCESS 1  Final   Gram Stain   Final    MODERATE WBC PRESENT,BOTH PMN AND MONONUCLEAR FEW GRAM POSITIVE COCCI    Culture   Final    CULTURE REINCUBATED FOR BETTER GROWTH Performed at [facility], [address], [place], [place] [address]    Report Status PENDING  Incomplete  Aerobic/Anaerobic Culture w Gram Stain (surgical/deep wound)     Status: None (Preliminary result)   Collection Time: 07/26/24 11:03 AM   Specimen: Path Tissue  Result Value Ref Range Status   Specimen Description TISSUE  Final   Special Requests LEFT FOOT ABSCESS 2  Final   Gram Stain   Final    FEW WBC PRESENT, PREDOMINANTLY PMN FEW GRAM POSITIVE COCCI    Culture   Final    CULTURE REINCUBATED FOR BETTER GROWTH Performed at [facility], [address] [place], [place] [address]    Report Status PENDING  Incomplete    Corean Fireman, MSN, NP-C [facility] Pager: [phone]  07/28/2024 11:13 AM        [1]  Social History Tobacco Use   Smoking status: Never   Smokeless tobacco: Never  Substance Use Topics   Alcohol use: No   Drug use: No  [2]  Allergies Allergen Reactions   Vancomycin  Other (See Comments)    Affected kidneys    Shrimp [Shellfish Allergy]    Zosyn [Piperacillin Sod-Tazobactam So] Other (See Comments)   "

## 2024-07-28 NOTE — Progress Notes (Signed)
 Occupational Therapy Treatment Patient Details Name: Derrick [name] MRN: [id number] DOB: 1980-09-01 Today's Date: 07/28/2024   History of present illness Pt is a 42 y.o male admitted 12/24 for LLE wound. MRI showing osteomyelitis of 5th phalanx and metatarsal head. S/p fifth ray amp and I&D 12/27. PMH: HTN, CHF, DM, cellulitis, morbid obesity, OSA s/p tracheotomy with PMV   OT comments  Pt progressing well towards OT goals. Focus of session on progressing functional mobility while maintaining WB precautions, and increasing independent engagement with ADL tasks. Pt highly motivated to return to baseline abilities and is demonstrating good progress. Pt required supervision for toilet transfer and LB dressing this session, as well as supervision for functional transfer with RW. OT to continue per POC.       If plan is discharge home, recommend the following:  A little help with walking and/or transfers;A little help with bathing/dressing/bathroom;Assistance with cooking/housework;Assist for transportation;Help with stairs or ramp for entrance   Equipment Recommendations  Tub/shower seat    Recommendations for Other Services      Precautions / Restrictions Precautions Precautions: Fall Recall of Precautions/Restrictions: Intact Required Braces or Orthoses: Other Brace Other Brace: Draco shoe Restrictions Weight Bearing Restrictions Per Provider Order: Yes LLE Weight Bearing Per Provider Order: Partial weight bearing LLE Partial Weight Bearing Percentage or Pounds: Through heel only       Mobility Bed Mobility Overal bed mobility: Modified Independent             General bed mobility comments: HOB elevated    Transfers Overall transfer level: Needs assistance Equipment used: Rolling walker (2 wheels) Transfers: Sit to/from Stand Sit to Stand: Supervision           General transfer comment: Supervision for safety, proper hand placement on RW. Able to maintain  WB status during ambulation     Balance Overall balance assessment: Needs assistance Sitting-balance support: No upper extremity supported, Feet supported Sitting balance-Leahy Scale: Normal     Standing balance support: Bilateral upper extremity supported, During functional activity Standing balance-Leahy Scale: Fair Standing balance comment: Benefits from RW support                           ADL either performed or assessed with clinical judgement   ADL Overall ADL's : Needs assistance/impaired                     Lower Body Dressing: Supervision/safety Lower Body Dressing Details (indicate cue type and reason): Supervision to don post-op shoe Toilet Transfer: Supervision/safety;Regular Toilet;Rolling walker (2 wheels);Grab bars Toilet Transfer Details (indicate cue type and reason): Supervision for safety. One verbal cue to step back closer to toilet prior to sitting. Use of grab bar to rise from commode                Extremity/Trunk Assessment Upper Extremity Assessment Upper Extremity Assessment: Overall WFL for tasks assessed            Vision       Perception     Praxis     Communication Communication Communication: No apparent difficulties   Cognition Arousal: Alert Behavior During Therapy: WFL for tasks assessed/performed Cognition: No apparent impairments                               Following commands: Intact        Cueing  Cueing Techniques: Verbal cues  Exercises      Shoulder Instructions       General Comments VSS throughout session. Pt with good recall of education from previous mobility session.    Pertinent Vitals/ Pain       Pain Assessment Pain Assessment: Faces Faces Pain Scale: Hurts whole lot Pain Location: LLE Pain Descriptors / Indicators: Grimacing, Guarding Pain Intervention(s): Limited activity within patient's tolerance, Monitored during session, Patient requesting pain meds-RN  notified  Home Living                                          Prior Functioning/Environment              Frequency  Min 2X/week        Progress Toward Goals  OT Goals(current goals can now be found in the care plan section)  Progress towards OT goals: Progressing toward goals  Acute Rehab OT Goals Patient Stated Goal: to go home OT Goal Formulation: With patient Time For Goal Achievement: 08/10/24 Potential to Achieve Goals: Good ADL Goals Pt Will Perform Grooming: with modified independence;standing Pt Will Perform Lower Body Dressing: with modified independence;sit to/from stand Pt Will Transfer to Toilet: with modified independence;ambulating;regular height toilet Pt Will Perform Toileting - Clothing Manipulation and hygiene: with modified independence;sit to/from stand;sitting/lateral leans  Plan      Co-evaluation                 AM-PAC OT 6 Clicks Daily Activity     Outcome Measure   Help from another person eating meals?: None Help from another person taking care of personal grooming?: A Little Help from another person toileting, which includes using toliet, bedpan, or urinal?: A Little Help from another person bathing (including washing, rinsing, drying)?: A Little Help from another person to put on and taking off regular upper body clothing?: A Little Help from another person to put on and taking off regular lower body clothing?: A Little 6 Click Score: 19    End of Session Equipment Utilized During Treatment: Rolling walker (2 wheels)  OT Visit Diagnosis: Unsteadiness on feet (R26.81);Other abnormalities of gait and mobility (R26.89)   Activity Tolerance Patient tolerated treatment well   Patient Left in bed;with call bell/phone within reach   Nurse Communication Mobility status;Patient requests pain meds        Time: 8464-8452 OT Time Calculation (min): 12 min  Charges: OT General Charges $OT Visit: 1 Visit OT  Treatments $Self Care/Home Management : 8-22 mins  Maurilio CROME, OTR/L.  [facility] Acute Rehabilitation  Office: [phone]   Maurilio PARAS Khristian Seals 07/28/2024, 4:38 PM

## 2024-07-29 ENCOUNTER — Other Ambulatory Visit (HOSPITAL_COMMUNITY): Payer: Self-pay

## 2024-07-29 DIAGNOSIS — L02612 Cutaneous abscess of left foot: Secondary | ICD-10-CM | POA: Diagnosis not present

## 2024-07-29 LAB — BASIC METABOLIC PANEL WITH GFR
Anion gap: 7 (ref 5–15)
BUN: 13 mg/dL (ref 6–20)
CO2: 29 mmol/L (ref 22–32)
Calcium: 8.2 mg/dL — ABNORMAL LOW (ref 8.9–10.3)
Chloride: 107 mmol/L (ref 98–111)
Creatinine, Ser: 1.22 mg/dL (ref 0.61–1.24)
GFR, Estimated: >60 mL/min
Glucose, Bld: 88 mg/dL (ref 70–99)
Potassium: 3.8 mmol/L (ref 3.5–5.1)
Sodium: 143 mmol/L (ref 135–145)

## 2024-07-29 LAB — CBC
HCT: 31.6 % — ABNORMAL LOW (ref 39.0–52.0)
Hemoglobin: 9.9 g/dL — ABNORMAL LOW (ref 13.0–17.0)
MCH: 28.9 pg (ref 26.0–34.0)
MCHC: 31.3 g/dL (ref 30.0–36.0)
MCV: 92.1 fL (ref 80.0–100.0)
Platelets: 172 K/uL (ref 150–400)
RBC: 3.43 MIL/uL — ABNORMAL LOW (ref 4.22–5.81)
RDW: 13.7 % (ref 11.5–15.5)
WBC: 6.3 K/uL (ref 4.0–10.5)
nRBC: 0 % (ref 0.0–0.2)

## 2024-07-29 LAB — SEDIMENTATION RATE: Sed Rate: 15 mm/h (ref 0–16)

## 2024-07-29 LAB — C-REACTIVE PROTEIN: CRP: <0.5 mg/dL

## 2024-07-29 MED ORDER — ASPIRIN 81 MG PO TBEC
81.0000 mg | DELAYED_RELEASE_TABLET | Freq: Two times a day (BID) | ORAL | 0 refills | Status: AC
  Filled 2024-07-29: qty 60, 30d supply, fill #0

## 2024-07-29 MED ORDER — AMOXICILLIN-POT CLAVULANATE 875-125 MG PO TABS
1.0000 | ORAL_TABLET | Freq: Two times a day (BID) | ORAL | 0 refills | Status: AC
  Filled 2024-07-29: qty 56, 28d supply, fill #0

## 2024-07-29 NOTE — Discharge Summary (Signed)
 Physician Discharge Summary  Derrick [name] FMW:[id number] DOB: [birth year] DOA: 07/23/2024  PCP: [name], MD  Admit date: 07/23/2024 Discharge date: 07/29/2024 Recommendations for Outpatient Follow-up:  Follow up with PCP in 1 weeks-call for appointment Please obtain BMP/CBC in one week  Discharge Dispo: home w/ [facility] Discharge Condition: Stable Code Status:   Code Status: Full Code Diet recommendation:  Diet Order             Diet Heart Room service appropriate? Yes; Fluid consistency: Thin; Fluid restriction: 1500 mL Fluid  Diet effective now                    Brief/Interim Summary: 43 y.o. male with medical history significant of OSA-s/p tracheotomy, HTN, chronic HFpEF, gout-who presented to the ED with left foot pain onset t Thursday  w/  pain/swelling and it got worse-on 12/22 when he woke up he noted there was a purulent discharge He was seen in ED given IV antibiotics sent home on Bactrim  and Keflex   but with no improvement In ED: CT left w/:Findings consistent with a subcutaneous abscess in the superolateral aspect of the forefoot, abutting and depressing the 5th extensor tendon, without CT evidence of acute osteomyelitis Orthopedics Dr. Beuford consulted advised bedside I&D, and antibiotics.  VSS- ;abs mild anemai wbc normal. US  left leg NEG for DVT on 12/22 S/p bedside I&d in ED> seen by ortho. MRI completed and resulted 12/26> Osteomyelitis of the fifth proximal phalanx and the fifth metatarsal head , s/c abscess measuring up to 4 x 2.4 x 2.1 cm with surrounding cellulitis. This abscess abuts the underlying traversing fifth extensor tendon. Underwent left foot first ray amputation and postop doing well with dressing intact Plan for antibiotics as per Ortho and ID. Patient to continue daily dressing change and as needed until seen in follow-up in 7 to 10 days in the orthopedic clinic  Subjective: Seen and examined Overnight remains afebrile and BP  stable labs remained stable with mild anemia Resting comfortably.  Eager to go home.  Per bedside  Discharge Diagnoses:   Acute osteomyelitis of the 5th proximal phalanx and  MT  head  s/c abscess measuring up to 4 x 2.4 x 2.1 Left foot infection with superficial abscess/cellulitis: S/P bedside debridement in the ED. seen by orthopedic on admission and MRI-showed acute osteomyelitis fifth proximal phalanx and MT head  S/P amputation lefT foot 5TH ray>IntraOp culture Gram stain- GPC-culture moderate group B strep agalactiae isolated no anaerobes-ID following recommending 4 weeks of Augmentin  and ID clinic follow-up to see if pathology margins clear then will stop antibiotic early. Patient is to follow-up in Ortho postop care. Treated with  Zyvox ,  Unasyn  (12/26>> Per ortho cont aspirin 81 twice daily for DVT prophylaxis x  30days-discussed today He will f/u  in 7 - 10 days for wound check Will discharge with dressing instruction Home health nurse  Prediabetes: A1c normal.  Chronic HFpEF: Is euvolemic, continue home Bumex , metoprolol  and monitor electrolytes. Net IO Since Admission: 2,350.15 mL [07/29/24 1051]   Hypokalemia: Resolved.    HTN Stable on metoprolol  and Bumex    Gout Stable .Not  taking meds at home   OSA s/p chronic tracheotomy Stable, routine trach care.   Morbid Obesity w/ Body mass index is 45.78 kg/m.: Will benefit with PCP follow-up, weight loss,healthy lifestyle  DVT prophylaxis: Lovenox  Code Status:   Code Status: Full Code Family Communication: plan of care discussed with patient at bedside. Patient status is:  Remains hospitalized because of severity of illness.  Level of care: Telemetry  Dispo: The patient is from:Home            Anticipated disposition: home with home health  Objective: Vitals last 24 hrs: Vitals:   07/29/24 0544 07/29/24 0847 07/29/24 0853 07/29/24 1011  BP: 111/74 112/72  128/83  Pulse: (!) 57 61  (!) 59  Resp: 17   14   Temp: 97.8 F (36.6 C)   (!) 97.4 F (36.3 C)  TempSrc: Oral     SpO2: 94%  96% 99%  Weight:      Height:        Physical Examination: General exam: AAOX3 pleasant, HEENT:Oral mucosa moist, Ear/Nose WNL, Trach site intact Respiratory system: Lungs bilaterally clear,  Cardiovascular system: S1 & S2 +, No JVD. Gastrointestinal system: Abdomen soft,NT,ND, BS+ Nervous System: Alert, awake, moving all extremities,and following commands. Extremities: extremities warm, left foot with dressing in place Skin: Warm, no rashes MSK: Normal muscle bulk,tone, power       Procedures (LRB): AMPUTATION, FOOT, RAY (Left)  Consultation: See note.  Discharge Instructions  Discharge Instructions     Discharge instructions   Complete by: As directed    Follow-up with ID clinic and orthopedic in 1 to 2 weeks Follow-up with PCP in 1 week  Please call call MD or return to ER for similar or worsening recurring problem that brought you to hospital or if any fever,nausea/vomiting,abdominal pain, uncontrolled pain, chest pain,  shortness of breath or any other alarming symptoms.  Please follow-up your doctor as instructed in a week time and call the office for appointment.  Please avoid alcohol, smoking, or any other illicit substance and maintain healthy habits including taking your regular medications as prescribed.  You were cared for by a hospitalist during your hospital stay. If you have any questions about your discharge medications or the care you received while you were in the hospital after you are discharged, you can call the unit and ask to speak with the hospitalist on call if the hospitalist that took care of you is not available.  Once you are discharged, your primary care physician will handle any further medical issues. Please note that NO REFILLS for any discharge medications will be authorized once you are discharged, as it is imperative that you return to your primary care  physician (or establish a relationship with a primary care physician if you do not have one) for your aftercare needs so that they can reassess your need for medications and monitor your lab values   Discharge wound care:   Complete by: As directed    Continue daily dressing and as needed   Increase activity slowly   Complete by: As directed       Allergies as of 07/29/2024       Reactions   Vancomycin  Other (See Comments)   Affected kidneys    Shrimp [shellfish Allergy]    Zosyn [piperacillin Sod-tazobactam So] Other (See Comments)        Medication List     TAKE these medications    acetaminophen  325 MG tablet Commonly known as: TYLENOL  Take 2 tablets (650 mg total) by mouth every 6 (six) hours as needed for mild pain or headache (fever >/= 101).   albuterol  108 (90 Base) MCG/ACT inhaler Commonly known as: VENTOLIN  HFA Inhale 2 puffs into the lungs every 6 (six) hours as needed for wheezing or shortness of breath.   allopurinol  300 MG  tablet Commonly known as: ZYLOPRIM  Take 1 tablet by mouth once daily What changed:  when to take this reasons to take this   amoxicillin -clavulanate 875-125 MG tablet Commonly known as: AUGMENTIN  Take 1 tablet by mouth 2 (two) times daily for 28 days.   aspirin EC 81 MG tablet Take 1 tablet (81 mg total) by mouth 2 (two) times daily. Swallow whole.   azelastine  0.1 % nasal spray Commonly known as: ASTELIN  Place 2 sprays into both nostrils 2 (two) times daily. Use in each nostril as directed What changed:  when to take this reasons to take this   bumetanide  0.5 MG tablet Commonly known as: BUMEX  Take 1 tablet by mouth once daily What changed:  when to take this reasons to take this   clindamycin  1 % gel Commonly known as: CLINDAGEL  Apply 1 application topically daily.   cyanocobalamin  1000 MCG tablet Commonly known as: VITAMIN B12 Take 1 tablet (1,000 mcg total) by mouth daily.   dextromethorphan -guaiFENesin  30-600  MG 12hr tablet Commonly known as: MUCINEX  DM Take 1 tablet by mouth 2 (two) times daily. What changed:  when to take this reasons to take this   gabapentin  300 MG capsule Commonly known as: NEURONTIN  TAKE 3 CAPSULES BY MOUTH THREE TIMES DAILY   levocetirizine 5 MG tablet Commonly known as: XYZAL  Take 1 tablet (5 mg total) by mouth every evening.   metoprolol  tartrate 50 MG tablet Commonly known as: LOPRESSOR  Take 1 tablet by mouth twice daily   montelukast  10 MG tablet Commonly known as: SINGULAIR  Take 1 tablet (10 mg total) by mouth at bedtime.   omeprazole 40 MG capsule Commonly known as: PRILOSEC Take 40 mg by mouth daily.   OneTouch Delica Lancets 33G Misc Use as needed daily   oxyCODONE  5 MG immediate release tablet Commonly known as: Oxy IR/ROXICODONE  Take 5-10 mg by mouth every 6 (six) hours as needed for pain. What changed: Another medication with the same name was added. Make sure you understand how and when to take each.   oxyCODONE  5 MG immediate release tablet Commonly known as: Roxicodone  Take 1 tablet (5 mg total) by mouth every 4 (four) hours as needed for up to 7 days (pain). What changed: You were already taking a medication with the same name, and this prescription was added. Make sure you understand how and when to take each.   Potassium 99 MG Tabs Take 1 tablet by mouth in the morning and at bedtime.   potassium chloride  20 MEQ packet Commonly known as: KLOR-CON  Take 20 mEq by mouth daily as needed (take with lasix  only).   Pro Comfort Spacer Adult Misc UAD with inhaler   tadalafil  10 MG tablet Commonly known as: CIALIS  TAKE 1 TABLET BY MOUTH EVERY OTHER DAY AS NEEDED FOR ERECTILE DYSFUNCTION What changed: See the new instructions.       ASK your doctor about these medications    cephALEXin  500 MG capsule Commonly known as: KEFLEX  Take 1 capsule (500 mg total) by mouth 3 (three) times daily for 7 days. Ask about: Should I take this  medication?   sulfamethoxazole -trimethoprim  800-160 MG tablet Commonly known as: BACTRIM  DS Take 1 tablet by mouth 2 (two) times daily for 7 days. Ask about: Should I take this medication?               Discharge Care Instructions  (From admission, onward)           Start     Ordered  07/29/24 0000  Discharge wound care:       Comments: Continue daily dressing and as needed   07/29/24 1049            Contact information for follow-up providers     Rollene Almarie LABOR, MD Follow up in 1 week(s).   Specialty: Internal Medicine Contact information: [address] [place] [address] [phone]              Contact information for after-discharge care     Home Medical Care     [facility]) Follow up.   Service: Home Health Services Contact information: [address] [place]  [phone]                    Allergies[1]  The results of significant diagnostics from this hospitalization (including imaging, microbiology, ancillary and laboratory) are listed below for reference.    Microbiology: Recent Results (from the past 240 hours)  Surgical pcr screen     Status: None   Collection Time: 07/25/24  7:54 PM   Specimen: Nasal Mucosa; Nasal Swab  Result Value Ref Range Status   MRSA, PCR NEGATIVE NEGATIVE Final   Staphylococcus aureus NEGATIVE NEGATIVE Final    Comment: (NOTE) The Xpert SA Assay (FDA approved for NASAL specimens in patients 36 years of age and older), is one component of a comprehensive surveillance program. It is not intended to diagnose infection nor to guide or monitor treatment. Performed at [facility], [address] [place], [place] [address]   Aerobic/Anaerobic Culture w Gram Stain (surgical/deep wound)     Status: None (Preliminary result)   Collection Time: 07/26/24 11:00 AM   Specimen: Path Tissue  Result Value Ref Range Status   Specimen  Description TISSUE  Final   Special Requests LEFT FOOT ABSCESS 1  Final   Gram Stain   Final    MODERATE WBC PRESENT,BOTH PMN AND MONONUCLEAR FEW GRAM POSITIVE COCCI Performed at [facility], [address] [place], [place] [address]    Culture   Final    FEW GROUP B STREP(S.AGALACTIAE)ISOLATED TESTING AGAINST S. AGALACTIAE NOT ROUTINELY PERFORMED DUE TO PREDICTABILITY OF AMP/PEN/VAN SUSCEPTIBILITY. NO ANAEROBES ISOLATED; CULTURE IN PROGRESS FOR 5 DAYS    Report Status PENDING  Incomplete  Aerobic/Anaerobic Culture w Gram Stain (surgical/deep wound)     Status: None (Preliminary result)   Collection Time: 07/26/24 11:03 AM   Specimen: Path Tissue  Result Value Ref Range Status   Specimen Description TISSUE  Final   Special Requests LEFT FOOT ABSCESS 2  Final   Gram Stain   Final    FEW WBC PRESENT, PREDOMINANTLY PMN FEW GRAM POSITIVE COCCI Performed at [facility], [address] [place], [place] [address]    Culture   Final    MODERATE GROUP B STREP(S.AGALACTIAE)ISOLATED TESTING AGAINST S. AGALACTIAE NOT ROUTINELY PERFORMED DUE TO PREDICTABILITY OF AMP/PEN/VAN SUSCEPTIBILITY. NO ANAEROBES ISOLATED; CULTURE IN PROGRESS FOR 5 DAYS    Report Status PENDING  Incomplete    Procedures/Studies: MR FOOT LEFT W WO CONTRAST Result Date: 07/25/2024 CLINICAL DATA:  Concern for osteomyelitis. EXAM: MRI OF THE LEFT FOREFOOT WITHOUT AND WITH CONTRAST TECHNIQUE: Multiplanar, multisequence MR imaging of the left foot was performed both before and after administration of intravenous contrast. CONTRAST:  10mL GADAVIST  GADOBUTROL  1 MMOL/ML IV SOLN COMPARISON:  CT of the left foot dated 07/23/2024. Left foot radiographs dated  07/21/2024. FINDINGS: Soft tissue/Bones/Joint/Cartilage T2 hyperintense marrow signal abnormality with corresponding confluent T1 hypointensity of the fifth proximal phalanx with similar less pronounced signal abnormality of the articulating fifth metatarsal head,  most compatible with osteomyelitis. Redemonstrated overlying peripherally enhancing subcutaneous abscess measuring up to 4 cm AP x 2.4 cm TR x 2.1 cm CC with surrounding soft tissue edema and enhancement and overlying cutaneous thickening. This abscess abuts the underlying traversing fifth extensor tendon. No acute fracture or dislocation. Mild degenerative changes of the first MTP joint. Moderate degenerative changes of the second TMT joint in the navicular-lateral cuneiform articulation. Mild degenerative changes elsewhere throughout the midfoot. No joint effusion. Ligaments Lisfranc ligament is intact. Muscles and Tendons Atrophy of the intrinsic foot musculature may reflect chronic denervation changes. Flexor and extensor tendons are intact. Soft tissue Subcutaneous abscess in the superolateral aspect of the forefoot overlying the fifth metatarsal head and fifth proximal phalanx, as described above. Focal region of cutaneous irregularity possibly reflecting site of ulceration or wound along the lateral plantar aspect of the forefoot overlying the base of the fifth proximal phalanx (series 5, image 34). There is surrounding cutaneous thickening with soft tissue edema and enhancement. Subcutaneous edema extending along the dorsal foot. IMPRESSION: 1. Osteomyelitis of the fifth proximal phalanx and the fifth metatarsal head. Overlying dorsal subcutaneous abscess measuring up to 4 x 2.4 x 2.1 cm with surrounding cellulitis. This abscess abuts the underlying traversing fifth extensor tendon. Focal region of cutaneous irregularity along the lateral plantar aspect of the forefoot could reflect a site of ulceration. 2. Moderate degenerative changes of the second TMT joint and the navicular-lateral cuneiform articulation. Mild degenerative changes elsewhere throughout the midfoot and the first MTP joint. 3. Atrophy of the intrinsic foot musculature may reflect chronic denervation changes. Electronically Signed   By:  Harrietta Sherry M.D.   On: 07/25/2024 12:14   CT FOOT LEFT W CONTRAST Result Date: 07/23/2024 EXAM: CT OF THE LEFT FOOT, WITH IV CONTRAST 07/23/2024 04:45:50 AM TECHNIQUE: Axial images were acquired through the left foot with IV contrast. Reformatted images were reviewed. Automated exposure control, iterative reconstruction, and/or weight based adjustment of the mA/kV was utilized to reduce the radiation dose to as low as reasonably achievable. 100 mL of iohexol  (OMNIPAQUE ) 300 MG/ML solution was administered intravenously. COMPARISON: Left foot 3 view x ray study 07/21/2024. CLINICAL HISTORY: Osteomyelitis suspected, foot, xray done. Left lower extremity doppler venous study was performed 2 days ago with no evidence of lower extremity DVT. FINDINGS: BONES: There is osteopenia without evidence of fractures or destructive bone lesions. Moderate hallux valgus with otherwise normal interosseous alignment. No midfoot sagging is seen. There is a small plantar calcaneal spur, which appears noninflammatory. An os trigonum is seen posterior to the subtalar joint. As above no underlying findings of acute osteomyelitis are seen. JOINTS: No dislocation. There is joint space narrowing and spurring involving the talonavicular joint, the naviculocuneiform joints, and the 2nd through 4th tarsometatarsal joints in keeping with midfoot arthrosis. There is mild first MTP joint space loss. There is no erosive arthropathy throughout. There is slight spurring at the ankle joint margins. SOFT TISSUES: There is diffuse moderate to severe subcutaneous edema in the ankle and foot. In the superolateral aspect of the foot there is a rim enhancing subcutaneous ovoid fluid collection measuring 23.5 Hounsfield units and containing a few air locules, overlying the distal 5th metatarsal and proximal phalanx of the 5th toe and measuring 3.1 x 1.7 x 2.0 cm. Findings consistent with  an abscess. This abuts and compresses the 5th extensor tendon.  There are no other localizing collections. The intrinsic tendons are intact. There is partial atrophy in the plantar intrinsic foot musculature, which is fairly common with long-standing diabetes. No foot ulcers are seen. IMPRESSION: 1. Findings consistent with a subcutaneous abscess in the superolateral aspect of the forefoot, abutting and depressing the 5th extensor tendon, without CT evidence of acute osteomyelitis. 2. Diffuse moderate to severe subcutaneous edema in the ankle and foot. 3. Osteopenia and degenerative change. Electronically signed by: Francis Quam MD 07/23/2024 05:40 AM EST RP Workstation: [id number]   US  Venous Img Lower  Left (DVT Study) Result Date: 07/21/2024 CLINICAL DATA:  Left lower extremity edema. EXAM: LEFT LOWER EXTREMITY VENOUS DOPPLER ULTRASOUND TECHNIQUE: Gray-scale sonography with compression, as well as color and duplex ultrasound, were performed to evaluate the deep venous system(s) from the level of the common femoral vein through the popliteal and proximal calf veins. COMPARISON:  Left lower extremity ultrasound 03/06/2016 FINDINGS: VENOUS Normal compressibility of the common femoral, superficial femoral, and popliteal veins, as well as the visualized calf veins. Visualized portions of profunda femoral vein and great saphenous vein unremarkable. No filling defects to suggest DVT on grayscale or color Doppler imaging. Doppler waveforms show normal direction of venous flow, normal respiratory plasticity and response to augmentation. Limited views of the contralateral common femoral vein are unremarkable. OTHER Subcutaneous edema in the lower leg. Limitations: none IMPRESSION: No evidence of left lower extremity DVT. Electronically Signed   By: Andrea Gasman M.D.   On: 07/21/2024 18:02   DG Foot Complete Left Result Date: 07/21/2024 CLINICAL DATA:  Foot edema EXAM: LEFT FOOT - COMPLETE 3+ VIEW COMPARISON:  03/15/2016 FINDINGS: Bones appear demineralized. No fracture or  malalignment. Limited by flexion at the MTP joints. Diffuse soft tissue swelling with more focal swelling and possible ulceration lateral aspect of the foot at the level of the fifth MTP joint. No radiopaque foreign body. No definite erosion or osseous destructive change. IMPRESSION: Diffuse soft tissue swelling with more focal swelling and possible ulceration lateral aspect of the foot at the level of the fifth MTP joint. No definite radiographic evidence for acute osseous abnormality Electronically Signed   By: Luke Bun M.D.   On: 07/21/2024 16:35    Labs: BNP (last 3 results) No results for input(s): BNP in the last 8760 hours. Basic Metabolic Panel: Recent Labs  Lab 07/24/24 0230 07/25/24 0649 07/27/24 0814 07/28/24 0603 07/29/24 0516  NA 140 141 138 141 143  K 3.3* 3.7 4.0 3.7 3.8  CL 102 105 105 105 107  CO2 30 30 26 28 29   GLUCOSE 92 91 122* 99 88  BUN 11 10 11 11 13   CREATININE 1.22 1.19 1.03 1.17 1.22  CALCIUM  8.5* 8.7* 8.9 8.9 8.2*   Liver Function Tests: Recent Labs  Lab 07/24/24 0230  AST 21  ALT 21  ALKPHOS 89  BILITOT 0.3  PROT 6.4*  ALBUMIN 3.3*   No results for input(s): LIPASE, AMYLASE in the last 168 hours. No results for input(s): AMMONIA in the last 168 hours. CBC: Recent Labs  Lab 07/24/24 0230 07/25/24 0649 07/27/24 0814 07/28/24 0603 07/29/24 0516  WBC 5.0 4.7 11.3* 7.7 6.3  HGB 10.6* 11.1* 11.2* 10.1* 9.9*  HCT 33.1* 35.2* 34.5* 30.8* 31.6*  MCV 89.5 90.7 89.1 90.1 92.1  PLT 186 189 222 177 172   CBG: No results for input(s): GLUCAP in the last 168 hours. Hgb A1c  No results for input(s): HGBA1C in the last 72 hours. Anemia work up No results for input(s): VITAMINB12, FOLATE, FERRITIN, TIBC, IRON, RETICCTPCT in the last 72 hours. Cardiac Enzymes: No results for input(s): CKTOTAL, CKMB, CKMBINDEX, TROPONINI in the last 168 hours. BNP: Invalid input(s): POCBNP D-Dimer No results for input(s):  DDIMER in the last 72 hours. Lipid Profile No results for input(s): CHOL, HDL, LDLCALC, TRIG, CHOLHDL, LDLDIRECT in the last 72 hours. Thyroid  function studies No results for input(s): TSH, T4TOTAL, T3FREE, THYROIDAB in the last 72 hours.  Invalid input(s): FREET3 Urinalysis    Component Value Date/Time   COLORURINE YELLOW 05/31/2023 0919   APPEARANCEUR CLEAR 05/31/2023 0919   LABSPEC 1.010 05/31/2023 0919   PHURINE 7.0 05/31/2023 0919   GLUCOSEU NEGATIVE 05/31/2023 0919   HGBUR NEGATIVE 05/31/2023 0919   BILIRUBINUR NEGATIVE 05/31/2023 0919   KETONESUR NEGATIVE 05/31/2023 0919   PROTEINUR >300 (A) 06/20/2015 2145   UROBILINOGEN 0.2 05/31/2023 0919   NITRITE NEGATIVE 05/31/2023 0919   LEUKOCYTESUR NEGATIVE 05/31/2023 0919   Sepsis Labs Recent Labs  Lab 07/25/24 0649 07/27/24 0814 07/28/24 0603 07/29/24 0516  WBC 4.7 11.3* 7.7 6.3   Microbiology Recent Results (from the past 240 hours)  Surgical pcr screen     Status: None   Collection Time: 07/25/24  7:54 PM   Specimen: Nasal Mucosa; Nasal Swab  Result Value Ref Range Status   MRSA, PCR NEGATIVE NEGATIVE Final   Staphylococcus aureus NEGATIVE NEGATIVE Final    Comment: (NOTE) The Xpert SA Assay (FDA approved for NASAL specimens in patients 53 years of age and older), is one component of a comprehensive surveillance program. It is not intended to diagnose infection nor to guide or monitor treatment. Performed at [facility], [address], [place], [place] [address]   Aerobic/Anaerobic Culture w Gram Stain (surgical/deep wound)     Status: None (Preliminary result)   Collection Time: 07/26/24 11:00 AM   Specimen: Path Tissue  Result Value Ref Range Status   Specimen Description TISSUE  Final   Special Requests LEFT FOOT ABSCESS 1  Final   Gram Stain   Final    MODERATE WBC PRESENT,BOTH PMN AND MONONUCLEAR FEW GRAM POSITIVE COCCI Performed at [facility], [address], [place], [place] [address]    Culture   Final    FEW GROUP B STREP(S.AGALACTIAE)ISOLATED TESTING AGAINST S. AGALACTIAE NOT ROUTINELY PERFORMED DUE TO PREDICTABILITY OF AMP/PEN/VAN SUSCEPTIBILITY. NO ANAEROBES ISOLATED; CULTURE IN PROGRESS FOR 5 DAYS    Report Status PENDING  Incomplete  Aerobic/Anaerobic Culture w Gram Stain (surgical/deep wound)     Status: None (Preliminary result)   Collection Time: 07/26/24 11:03 AM   Specimen: Path Tissue  Result Value Ref Range Status   Specimen Description TISSUE  Final   Special Requests LEFT FOOT ABSCESS 2  Final   Gram Stain   Final    FEW WBC PRESENT, PREDOMINANTLY PMN FEW GRAM POSITIVE COCCI Performed at [name], [address] [place], [place] [address]    Culture   Final    MODERATE GROUP B STREP(S.AGALACTIAE)ISOLATED TESTING AGAINST S. AGALACTIAE NOT ROUTINELY PERFORMED DUE TO PREDICTABILITY OF AMP/PEN/VAN SUSCEPTIBILITY. NO ANAEROBES ISOLATED; CULTURE IN PROGRESS FOR 5 DAYS    Report Status PENDING  Incomplete     Time coordinating discharge: 35  minutes  SIGNED: Mennie LAMY, MD  [facility] 07/29/2024, 10:52 AM  If 7PM-7AM, please contact night-coverage [email or web address]       [  1]  Allergies Allergen Reactions   Vancomycin  Other (See Comments)    Affected kidneys    Shrimp [Shellfish Allergy]    Zosyn [Piperacillin Sod-Tazobactam So] Other (See Comments)

## 2024-07-29 NOTE — TOC Progression Note (Addendum)
 Transition of Care (TOC) - Progression Note   Amy with [facility] notified of discharge today.   Bedside nurse will provide wound education and some supplies   NCM just notified of shower stool order. Called Mitch with [facility] patient's insurance is out of network.   Called Jermaine with [employer] , patient's insurance does not cover shower stool . Instructed patient can purchase one cheaper at [employer] , [facility] etc . Discharge nurse aware   Patient Details  Name: Derrick [name] MRN: [id number] Date of Birth: 23-Apr-1981  Transition of Care [facility]) CM/SW Contact  Samatha Anspach, Powell Jansky, RN Phone Number: 07/29/2024, 12:09 PM  Clinical Narrative:       Expected Discharge Plan: Home/Self Care Barriers to Discharge: Continued Medical Work up               Expected Discharge Plan and Services In-house Referral: NA Discharge Planning Services: CM Consult Post Acute Care Choice: NA Living arrangements for the past 2 months: Single Family Home Expected Discharge Date: 07/29/24               DME Arranged: N/A DME Agency: NA       HH Arranged: NA HH Agency: NA         Social Drivers of Health (SDOH) Interventions SDOH Screenings   Food Insecurity: No Food Insecurity (07/23/2024)  Housing: Unknown (07/23/2024)  Transportation Needs: No Transportation Needs (07/23/2024)  Utilities: Not At Risk (07/23/2024)  Depression (PHQ2-9): Low Risk (05/30/2024)  Financial Resource Strain: Low Risk (08/02/2023)   Received from [facility]  Physical Activity: Insufficiently Active (08/02/2023)   Received from [facility]  Social Connections: Socially Integrated (08/02/2023)   Received from [facility]  Stress: No Stress Concern Present (10/23/2023)   Received from [facility]  Recent Concern: Stress - Stress Concern Present (08/02/2023)   Received from [facility]  Tobacco Use: Low Risk (07/26/2024)    Readmission Risk Interventions     No data to display

## 2024-07-29 NOTE — Progress Notes (Signed)
" ° °  Brief Progress Note   _____________________________________________________________________________________________________________  Patient Name: Ellaree DELENA Molt Patient DOB: 01/09/1981 Date: @TODAY @   Needs d/c order and DME order for shower chair _____________________________________________________________________________________________________________  [facility] RN Expeditor Ronal DELENA Bald Please contact us  directly via secure chat (search for [facility]) or by calling us  at [phone] [facility]).  "

## 2024-07-29 NOTE — Progress Notes (Signed)
 ID Update -     Group B streptococcus growing   Will do 4 weeks of augmentin  plan - keeping current plan for follow up with me next week to go over pathology and reaffirm duration of tx.   OK for discharge. D/W Dr. Christobal Corean Fireman, MSN, NP-C [facility]  [email or web address] Pager: [phone] Office: [phone] [facility] Main Line: [phone] *Secure Chat Communication Welcome

## 2024-07-29 NOTE — Progress Notes (Signed)
" °   07/29/24 0900  Cardiac Monitoring Interventions  Daily electrodes changes Electrodes changed  Cardiac monitoring box daily battery changes Batteries changed    "

## 2024-07-29 NOTE — Plan of Care (Signed)
  Problem: Pain Managment: Goal: General experience of comfort will improve and/or be controlled Outcome: Progressing   Problem: Safety: Goal: Ability to remain free from injury will improve Outcome: Progressing   Problem: Skin Integrity: Goal: Risk for impaired skin integrity will decrease Outcome: Progressing

## 2024-07-29 NOTE — Progress Notes (Signed)
 Explained discharge instructions to patient. Reviewed follow up appointment and next medication administration times. Also reviewed education. Patient verbalized having an understanding for instructions given. All belongings are in the patient's possession to include TOC meds. IV was removed by unit Telemetry were removed. CCMD was notified. Patient's dressing was changed and dressing education was given to both him and his mother. Supplies were sent with him. No other needs verbalized. Transported downstairs for discharge.

## 2024-07-29 NOTE — Plan of Care (Signed)
" °  Problem: Education: Goal: Knowledge of General Education information will improve Description: Including pain rating scale, medication(s)/side effects and non-pharmacologic comfort measures Outcome: Adequate for Discharge   Problem: Health Behavior/Discharge Planning: Goal: Ability to manage health-related needs will improve Outcome: Adequate for Discharge   Problem: Clinical Measurements: Goal: Ability to maintain clinical measurements within normal limits will improve Outcome: Adequate for Discharge Goal: Will remain free from infection Outcome: Adequate for Discharge Goal: Diagnostic test results will improve Outcome: Adequate for Discharge Goal: Respiratory complications will improve Outcome: Adequate for Discharge Goal: Cardiovascular complication will be avoided Outcome: Adequate for Discharge   Problem: Activity: Goal: Risk for activity intolerance will decrease Outcome: Adequate for Discharge   Problem: Nutrition: Goal: Adequate nutrition will be maintained Outcome: Adequate for Discharge   Problem: Coping: Goal: Level of anxiety will decrease Outcome: Adequate for Discharge   Problem: Pain Managment: Goal: General experience of comfort will improve and/or be controlled Outcome: Adequate for Discharge   Problem: Safety: Goal: Ability to remain free from injury will improve Outcome: Adequate for Discharge   Problem: Skin Integrity: Goal: Risk for impaired skin integrity will decrease Outcome: Adequate for Discharge   Problem: Acute Rehab OT Goals (only OT should resolve) Goal: Pt. Will Perform Grooming Outcome: Adequate for Discharge Goal: Pt. Will Perform Lower Body Dressing Outcome: Adequate for Discharge Goal: Pt. Will Transfer To Toilet Outcome: Adequate for Discharge Goal: Pt. Will Perform Toileting-Clothing Manipulation Outcome: Adequate for Discharge   Problem: Acute Rehab PT Goals(only PT should resolve) Goal: Patient Will Transfer Sit To/From  Stand Outcome: Adequate for Discharge Goal: Pt Will Ambulate Outcome: Adequate for Discharge Goal: Pt Will Go Up/Down Stairs Outcome: Adequate for Discharge   "

## 2024-07-31 LAB — AEROBIC/ANAEROBIC CULTURE W GRAM STAIN (SURGICAL/DEEP WOUND)

## 2024-08-02 LAB — SURGICAL PATHOLOGY

## 2024-08-05 ENCOUNTER — Telehealth: Payer: Self-pay

## 2024-08-05 NOTE — Telephone Encounter (Signed)
 Copied from CRM #[id number]. Topic: Appointments - Scheduling Inquiry for Clinic >> Aug 05, 2024 10:20 AM Derrick [name] wrote: Reason for CRM: The patient called in stating that he has an appointment this upcoming Friday. Upon his request I was unable to find an opening on Thursday as he requested. His reasoning is his commute to [place] is quite far and since he already has an appt 08/07/2024 - he wanted to know if he could see his PCP for the [facility]. F/U Same day.   Please Advise

## 2024-08-07 ENCOUNTER — Ambulatory Visit: Payer: Self-pay | Admitting: Infectious Diseases

## 2024-08-07 ENCOUNTER — Other Ambulatory Visit: Payer: Self-pay

## 2024-08-07 VITALS — BP 113/75 | HR 72 | Temp 98.1°F | Wt 278.0 lb

## 2024-08-07 DIAGNOSIS — I890 Lymphedema, not elsewhere classified: Secondary | ICD-10-CM

## 2024-08-07 DIAGNOSIS — L98499 Non-pressure chronic ulcer of skin of other sites with unspecified severity: Secondary | ICD-10-CM

## 2024-08-07 DIAGNOSIS — A491 Streptococcal infection, unspecified site: Secondary | ICD-10-CM

## 2024-08-07 DIAGNOSIS — L98493 Non-pressure chronic ulcer of skin of other sites with necrosis of muscle: Secondary | ICD-10-CM

## 2024-08-07 DIAGNOSIS — I872 Venous insufficiency (chronic) (peripheral): Secondary | ICD-10-CM | POA: Diagnosis not present

## 2024-08-07 DIAGNOSIS — R197 Diarrhea, unspecified: Secondary | ICD-10-CM

## 2024-08-07 DIAGNOSIS — G629 Polyneuropathy, unspecified: Secondary | ICD-10-CM | POA: Diagnosis not present

## 2024-08-07 NOTE — Progress Notes (Signed)
 "     Patient: Derrick [name]  DOB: [birth year] MRN: [id number] PCP: [name], MD    Chief Complaint  Patient presents with   Follow-up      Subjective   Subjective:  Discussed the use of AI scribe software for clinical note transcription with the patient, who gave verbal consent to proceed.  History of Present Illness   Derrick [name] is a 44 year old male with a history of neuropathic ulcer and osteomyelitis who presents for follow-up after a partial fifth ray amputation on his left foot. He is accompanied by his mother.  He is here to review final cultures from the operation on his left foot, where he required a partial fifth ray amputation due to a neuropathic ulcer that progressed to osteomyelitis. He had a follow-up with his orthopedic team two days ago. Sutures and staples were left in place.  He is experiencing diarrhea, which he attributes to the antibiotics he is taking. Every antibiotic he has taken in the past has caused diarrhea and yeast infections. He is currently on a combination of amoxicillin  and clavulanic acid, taken twice a day. No fevers or chills. He reports some drainage from the surgical site, described as thin and not thick. He is cleaning the area with saline solution from the hospital and applying dry dressings afterward. He experiences a touch of pain when cleaning the area.  He is concerned about the cause of his foot issues, mentioning a blister from shoes and a callus under his foot. He has experienced weight loss but notes that his foot and leg still swell. He describes episodes where his foot swells, becomes warm, and is treated with antibiotics. He has a history of cellulitis in 2011 and neuropathy, which affects his ability to feel his feet.  He uses compression stockings to manage swelling and has a history of lymphedema. His legs were previously discolored due to venous stasis but have improved with weight loss. He is not  diabetic but is meticulous about checking his feet regularly to prevent issues.       Review of Systems  Constitutional:  Negative for chills and fever.  HENT: Negative.    Gastrointestinal:  Positive for diarrhea. Negative for abdominal pain, nausea and vomiting.  Skin:  Negative for rash.     Past Medical History:  Diagnosis Date   Acute on chronic diastolic CHF (congestive heart failure), NYHA class 1 (HCC)    Cellulitis    Diabetes mellitus without complication (HCC)    GERD (gastroesophageal reflux disease)    Hypertension    Hypoxemia    Morbid obesity (HCC)    Obesity hypoventilation syndrome (HCC)    OSA (obstructive sleep apnea)    Reflux     Outpatient Medications Prior to Visit  Medication Sig Dispense Refill   acetaminophen  (TYLENOL ) 325 MG tablet Take 2 tablets (650 mg total) by mouth every 6 (six) hours as needed for mild pain or headache (fever >/= 101).     albuterol  (VENTOLIN  HFA) 108 (90 Base) MCG/ACT inhaler Inhale 2 puffs into the lungs every 6 (six) hours as needed for wheezing or shortness of breath. 1 each 0   allopurinol  (ZYLOPRIM ) 300 MG tablet Take 1 tablet by mouth once daily 90 tablet 0   amoxicillin -clavulanate (AUGMENTIN ) 875-125 MG tablet Take 1 tablet by mouth 2 (two) times daily for 28 days. 56 tablet 0   aspirin  EC 81 MG tablet Take 1 tablet (81 mg total) by  mouth 2 (two) times daily. Swallow whole. 60 tablet 0   azelastine  (ASTELIN ) 0.1 % nasal spray Place 2 sprays into both nostrils 2 (two) times daily. Use in each nostril as directed (Patient taking differently: Place 2 sprays into both nostrils as needed for rhinitis or allergies. Use in each nostril as directed) 30 mL 0   bumetanide  (BUMEX ) 0.5 MG tablet Take 1 tablet by mouth once daily 30 tablet 0   clindamycin  (CLINDAGEL ) 1 % gel Apply 1 application topically daily.     cyanocobalamin  (VITAMIN B12) 1000 MCG tablet Take 1 tablet (1,000 mcg total) by mouth daily. 90 tablet 3    dextromethorphan -guaiFENesin  (MUCINEX  DM) 30-600 MG 12hr tablet Take 1 tablet by mouth 2 (two) times daily. (Patient taking differently: Take 1 tablet by mouth 2 (two) times daily as needed for cough.) 60 tablet 6   gabapentin  (NEURONTIN ) 300 MG capsule TAKE 3 CAPSULES BY MOUTH THREE TIMES DAILY 270 capsule 0   levocetirizine (XYZAL ) 5 MG tablet Take 1 tablet (5 mg total) by mouth every evening. 30 tablet 1   metoprolol  tartrate (LOPRESSOR ) 50 MG tablet Take 1 tablet by mouth twice daily 180 tablet 0   montelukast  (SINGULAIR ) 10 MG tablet Take 1 tablet (10 mg total) by mouth at bedtime. 90 tablet 3   omeprazole (PRILOSEC) 40 MG capsule Take 40 mg by mouth daily.     ONETOUCH DELICA LANCETS 33G MISC Use as needed daily 100 each 11   oxyCODONE  (OXY IR/ROXICODONE ) 5 MG immediate release tablet Take 5-10 mg by mouth every 6 (six) hours as needed for pain.     Potassium 99 MG TABS Take 1 tablet by mouth in the morning and at bedtime.     potassium chloride  (KLOR-CON ) 20 MEQ packet Take 20 mEq by mouth daily as needed (take with lasix  only). 30 packet 2   Spacer/Aero-Holding Chambers (PRO COMFORT SPACER ADULT) MISC UAD with inhaler 1 each 0   tadalafil  (CIALIS ) 10 MG tablet TAKE 1 TABLET BY MOUTH EVERY OTHER DAY AS NEEDED FOR ERECTILE DYSFUNCTION 10 tablet 0   No facility-administered medications prior to visit.     Allergies[1]  Social History[2]  Family History  Problem Relation Age of Onset   Diabetes Maternal Grandmother    Hypertension Maternal Grandmother    Asthma Other    Cancer Other    Stroke Other    Heart disease Other        Objective   Objective:   Vitals:   08/07/24 0855  BP: 113/75  Pulse: 72  Temp: 98.1 F (36.7 C)  TempSrc: Oral  SpO2: 99%  Weight: 278 lb (126.1 kg)   Body mass index is 41.05 kg/m.  Physical Exam Constitutional:      Appearance: He is not ill-appearing.  Cardiovascular:     Rate and Rhythm: Normal rate.  Musculoskeletal:        Legs:     Comments: Left foot reviewed - no cellulitis, induration. Scab noted along incision. Sutures intact. Some tenderness with moderate palpation. No drainage noted.  Heel WB boot in place. Signs of stasis dermatitis up mid calf circumferentially noted. Swelling is under good control today.   Skin:    General: Skin is warm and dry.  Neurological:     Mental Status: He is alert.         Assessment & Plan:     Left foot post-amputation for neuropathic ulcer - Post-operative status following partial fifth ray amputation for neuropathic  ulcer that progressed to osteomyelitis. Cultures grew Group B strep. Pathology showed no specific pathologic change of the bone and was negative for osteomyelitis. Healing is progressing well with no signs of infection or significant drainage. Sutures and staples remain in place. No fever or chills reported. Pain is minimal and related to the boot rather than the surgical site. - Continue current regimen Augmentin  for 3 weeks. - Monitor for signs of infection or increased drainage. - Continue using the boot for weight-bearing as tolerated. - Scheduled follow-up appointment in four weeks after antibioitcs are completed with either myself or Dr. Overton   Antibiotic-associated diarrhea - Diarrhea likely secondary to antibiotic use. He has a history of similar symptoms with previous antibiotic courses. - Recommended Imodium to manage diarrhea. - Instructed to call if diarrhea becomes watery or if abdominal pain develops.  Chronic venous stasis with lymphedema - Chronic venous stasis with associated lymphedema. Swelling and discoloration noted, likely due to venous insufficiency. Compression stockings have been beneficial in the past. Weight loss has improved symptoms but may not completely reverse venous stasis changes. - Discuss referral to vascular specialist with primary care physician to assess blood flow. - Continue use of compression stockings with 20-30  mmHg pressure (once able to don these per ortho recs - toeless ones may be best but swelling looks pretty good today with him keeping leg elevated)  - Monitor for changes in swelling or skin condition, drainage, pain   Peripheral neuropathy of lower extremity - Peripheral neuropathy contributing to decreased sensation in the lower extremities. Increased risk of unnoticed injuries and delayed healing due to neuropathy. - Continue regular podiatry appointments. - Encourage daily foot checks or have a loved one assist with checks three times a week. - Be vigilant with new footwear to prevent friction points and injuries.  Visit duration: 19 minutes     Wound care done personally during visit. Total encounter time including time spent preparing for patient and documentation - 26 minutes    No orders of the defined types were placed in this encounter.   No orders of the defined types were placed in this encounter.   Return in about 4 weeks (around 09/04/2024).   Corean Fireman, MSN, NP-C [facility]  [email or web address] Pager: [phone] Office: [phone] [facility] Main Line: [phone] *Secure Chat Communication Welcome      [1]  Allergies Allergen Reactions   Vancomycin  Other (See Comments)    Affected kidneys    Shrimp [Shellfish Allergy]    Zosyn [Piperacillin Sod-Tazobactam So] Other (See Comments)  [2]  Social History Tobacco Use   Smoking status: Never   Smokeless tobacco: Never  Substance Use Topics   Alcohol use: No   Drug use: No   "

## 2024-08-07 NOTE — Patient Instructions (Addendum)
 Continue the Augmentin  (amoxicillin  antibiotic) twice a day  Compression stockings 20-30 mmHg is ideal - there are some socks with open toes  Only when your wound heals better to use this tighter sock   Continue the wound care as you have been doing.   Come back to see either myself or Dr. Overton in about 4 weeks time   Please continue the course of the antibiotics you have until they are gone   Imodium can help for diarrhea - call me if you have bloating / discomfort or they turn watery

## 2024-08-08 ENCOUNTER — Ambulatory Visit: Payer: Self-pay | Admitting: Internal Medicine

## 2024-08-08 ENCOUNTER — Encounter: Payer: Self-pay | Admitting: Internal Medicine

## 2024-08-08 VITALS — BP 102/70 | HR 82 | Temp 98.2°F | Ht 69.0 in | Wt 281.2 lb

## 2024-08-08 DIAGNOSIS — Z930 Tracheostomy status: Secondary | ICD-10-CM | POA: Diagnosis not present

## 2024-08-08 DIAGNOSIS — G4733 Obstructive sleep apnea (adult) (pediatric): Secondary | ICD-10-CM

## 2024-08-08 DIAGNOSIS — Z6841 Body Mass Index (BMI) 40.0 and over, adult: Secondary | ICD-10-CM | POA: Diagnosis not present

## 2024-08-08 DIAGNOSIS — Z89422 Acquired absence of other left toe(s): Secondary | ICD-10-CM | POA: Diagnosis not present

## 2024-08-08 MED ORDER — ZEPBOUND 7.5 MG/0.5ML ~~LOC~~ SOAJ: 7.5000 mg | SUBCUTANEOUS | 0 refills | Status: DC

## 2024-08-08 MED ORDER — BUPROPION HCL ER (XL) 150 MG PO TB24: 150.0000 mg | ORAL_TABLET | Freq: Every day | ORAL | 1 refills | Status: AC

## 2024-08-08 MED ORDER — ZEPBOUND 2.5 MG/0.5ML ~~LOC~~ SOAJ: 2.5000 mg | SUBCUTANEOUS | 0 refills | Status: DC

## 2024-08-08 MED ORDER — ZEPBOUND 5 MG/0.5ML ~~LOC~~ SOAJ: 5.0000 mg | SUBCUTANEOUS | 0 refills | Status: DC

## 2024-08-08 NOTE — Progress Notes (Signed)
 "  Subjective:   Patient ID: Derrick [name], male    DOB: [birth year], 44 y.o.   MRN: [id number]  Discussed the use of AI scribe software for clinical note transcription with the patient, who gave verbal consent to proceed.  History of Present Illness Derrick [name] is a 44 year old male who presents for follow-up care after a recent toe amputation due to a foot infection.  He was recently hospitalized for a foot infection that resulted in the amputation of his little toe. He reports that he was told he had a foot infection and cellulitis, and that the infection involved the bone. He was hospitalized for ten days during which he received treatment, including antibiotics. He is currently taking amoxicillin  twice a day. The healing process is ongoing, and he describes the area as 'doing okay'. He is wearing a boot to avoid putting pressure on the healing site.  He has severe sleep apnea and has a tracheostomy. He is currently focused on weight management, having recently weighed 280 pounds, which he notes is a significant reduction from previous visits. He aims to further reduce his weight, with a goal of reaching 220-250 pounds at which point he would be able to get the tracheostomy out.  Review of Systems  Constitutional: Negative.   HENT: Negative.    Eyes: Negative.   Respiratory:  Negative for cough, chest tightness and shortness of breath.   Cardiovascular:  Negative for chest pain, palpitations and leg swelling.  Gastrointestinal:  Negative for abdominal distention, abdominal pain, constipation, diarrhea, nausea and vomiting.  Musculoskeletal:  Positive for arthralgias.  Skin: Negative.   Neurological: Negative.   Psychiatric/Behavioral: Negative.      Objective:  Physical Exam Constitutional:      Appearance: He is well-developed.  HENT:     Head: Normocephalic and atraumatic.  Cardiovascular:     Rate and Rhythm: Normal rate and regular rhythm.  Pulmonary:      Effort: Pulmonary effort is normal. No respiratory distress.     Breath sounds: Normal breath sounds. No wheezing or rales.  Abdominal:     General: Bowel sounds are normal. There is no distension.     Palpations: Abdomen is soft.     Tenderness: There is no abdominal tenderness.  Musculoskeletal:     Cervical back: Normal range of motion.  Skin:    General: Skin is warm and dry.  Neurological:     Mental Status: He is alert and oriented to person, place, and time.     Coordination: Coordination abnormal.     Comments: Cane wearing surgical shoe left foot     Vitals:   08/08/24 0914  BP: 102/70  Pulse: 82  Temp: 98.2 F (36.8 C)  TempSrc: Oral  SpO2: 97%  Weight: 281 lb 3.2 oz (127.6 kg)  Height: 5' 9 (1.753 m)    Assessment and Plan Assessment & Plan  left 5th  toe status post toe amputation  due to osteomyelitis He was recently hospitalized for cellulitis complicated by osteomyelitis, resulting in toe amputation. Continue amoxicillin  as prescribed until completion. Follow up with infectious disease in four weeks and attend an orthopedic appointment on January 29th. Monitor the foot for signs of infection including redness, fever, chills, worsening pain.  Morbid Obesity   He currently weighs 280 lbs with a goal of 220 lbs. Zepbound  is considered for weight loss and sleep apnea management, with insurance approval potentially facilitated by the sleep apnea indication. A prescription for  Zepbound  has been submitted, and the prior authorization process with insurance has been initiated. He is also using diet and activity to manage weight as well and is s/p bariatric procedure which helped.   Tracheostomy dependent His weight goal is around 250 pounds to qualify for removal of his trach which is managing his severe OSA. No complications of his trach today.   Obstructive sleep apnea with tracheostomy   Severe obstructive sleep apnea is managed with a tracheostomy. Weight loss is  anticipated to improve symptoms. Zepbound  is considered beneficial for both weight loss and sleep apnea management. Proceed with the Zepbound  prescription for weight loss, which may also aid in sleep apnea management. He is also using diet and activity to manage.   "

## 2024-08-08 NOTE — Patient Instructions (Signed)
 We will prescribe zepbound  to help treat the sleep apnea and get the weight down.

## 2024-08-11 ENCOUNTER — Other Ambulatory Visit: Payer: Self-pay | Admitting: Internal Medicine

## 2024-08-12 ENCOUNTER — Other Ambulatory Visit: Payer: Self-pay | Admitting: Internal Medicine

## 2024-08-18 ENCOUNTER — Other Ambulatory Visit: Payer: Self-pay | Admitting: Internal Medicine

## 2024-08-28 ENCOUNTER — Telehealth: Payer: Self-pay

## 2024-08-28 NOTE — Telephone Encounter (Signed)
 Copied from CRM [id number]. Topic: Clinical - Prescription Issue >> Aug 28, 2024  2:39 PM Rea ORN wrote: Reason for CRM: Pt stated that he has been unable to get his zepbound  from [facility]. I advised that it was sent in on 1/12 to the pharmacy. Then I advised that on  1/13 and 1/19 refill requests came through and were denied since she had just sent it in. Pt still has not been able to get his rx.  Please call back to advise,  [phone]

## 2024-08-31 ENCOUNTER — Other Ambulatory Visit: Payer: Self-pay | Admitting: Internal Medicine

## 2024-08-31 ENCOUNTER — Other Ambulatory Visit: Payer: Self-pay | Admitting: Family Medicine

## 2024-08-31 ENCOUNTER — Other Ambulatory Visit: Payer: Self-pay | Admitting: Family

## 2024-08-31 DIAGNOSIS — J3089 Other allergic rhinitis: Secondary | ICD-10-CM

## 2024-08-31 DIAGNOSIS — E118 Type 2 diabetes mellitus with unspecified complications: Secondary | ICD-10-CM

## 2024-08-31 DIAGNOSIS — L03116 Cellulitis of left lower limb: Secondary | ICD-10-CM

## 2024-09-01 ENCOUNTER — Telehealth: Payer: Self-pay

## 2024-09-01 NOTE — Telephone Encounter (Signed)
 Copied from CRM #[id number]. Topic: Clinical - Medication Question >> Sep 01, 2024  9:36 AM Antony RAMAN wrote: Reason for CRM: says he has a yeast infection from taking antibiotics, requesting meds for that

## 2024-09-02 MED ORDER — WEGOVY 4 MG PO TABS: 4.0000 mg | ORAL_TABLET | Freq: Every day | ORAL | 0 refills | Status: AC

## 2024-09-02 MED ORDER — WEGOVY 9 MG PO TABS: 9.0000 mg | ORAL_TABLET | Freq: Every day | ORAL | 0 refills | Status: AC

## 2024-09-02 MED ORDER — WEGOVY 25 MG PO TABS: 25.0000 mg | ORAL_TABLET | Freq: Every day | ORAL | 2 refills | Status: AC

## 2024-09-02 MED ORDER — WEGOVY 1.5 MG PO TABS: 1.5000 mg | ORAL_TABLET | Freq: Every day | ORAL | 0 refills | Status: AC

## 2024-09-02 NOTE — Addendum Note (Signed)
 Addended by: ROLLENE NORRIS A on: 09/02/2024 11:21 AM   Modules accepted: Orders

## 2024-09-03 MED ORDER — FLUCONAZOLE 150 MG PO TABS: 150.0000 mg | ORAL_TABLET | ORAL | 0 refills | Status: AC

## 2024-09-03 NOTE — Telephone Encounter (Signed)
 Sent in diflucan  for him

## 2024-09-03 NOTE — Addendum Note (Signed)
 Addended by: ROLLENE NORRIS A on: 09/03/2024 11:32 AM   Modules accepted: Orders

## 2024-09-04 ENCOUNTER — Ambulatory Visit: Payer: Self-pay | Admitting: Infectious Diseases

## 2024-09-04 NOTE — Telephone Encounter (Signed)
 Pt is aware.

## 2024-11-07 ENCOUNTER — Ambulatory Visit: Admitting: Internal Medicine
